# Patient Record
Sex: Male | Born: 1950 | Race: White | Hispanic: No | Marital: Married | Smoking: Never smoker
Health system: Southern US, Community
[De-identification: ages and names within clinical notes are randomized; demographics above are authoritative.]

## PROBLEM LIST (undated history)

## (undated) DIAGNOSIS — E079 Disorder of thyroid, unspecified: Secondary | ICD-10-CM

## (undated) DIAGNOSIS — Z86711 Personal history of pulmonary embolism: Secondary | ICD-10-CM

## (undated) DIAGNOSIS — K1230 Oral mucositis (ulcerative), unspecified: Principal | ICD-10-CM

## (undated) DIAGNOSIS — D689 Coagulation defect, unspecified: Secondary | ICD-10-CM

## (undated) DIAGNOSIS — Z923 Personal history of irradiation: Secondary | ICD-10-CM

## (undated) DIAGNOSIS — I10 Essential (primary) hypertension: Secondary | ICD-10-CM

## (undated) DIAGNOSIS — J309 Allergic rhinitis, unspecified: Secondary | ICD-10-CM

## (undated) DIAGNOSIS — Z87442 Personal history of urinary calculi: Secondary | ICD-10-CM

## (undated) DIAGNOSIS — R109 Unspecified abdominal pain: Secondary | ICD-10-CM

## (undated) DIAGNOSIS — C090 Malignant neoplasm of tonsillar fossa: Secondary | ICD-10-CM

## (undated) DIAGNOSIS — C099 Malignant neoplasm of tonsil, unspecified: Secondary | ICD-10-CM

## (undated) DIAGNOSIS — L270 Generalized skin eruption due to drugs and medicaments taken internally: Principal | ICD-10-CM

## (undated) DIAGNOSIS — E039 Hypothyroidism, unspecified: Secondary | ICD-10-CM

## (undated) HISTORY — DX: Disorder of thyroid, unspecified: E07.9

## (undated) HISTORY — DX: Personal history of pulmonary embolism: Z86.711

## (undated) HISTORY — DX: Generalized skin eruption due to drugs and medicaments taken internally: L27.0

## (undated) HISTORY — DX: Coagulation defect, unspecified: D68.9

## (undated) HISTORY — PX: IR GASTROSTOMY TUBE REMOVAL: IMG5492

## (undated) HISTORY — DX: Unspecified abdominal pain: R10.9

## (undated) HISTORY — PX: KNEE SURGERY: SHX244

## (undated) HISTORY — PX: EYE SURGERY: SHX253

## (undated) HISTORY — DX: Personal history of urinary calculi: Z87.442

## (undated) HISTORY — PX: GASTROSTOMY TUBE PLACEMENT: SHX655

## (undated) HISTORY — PX: FOOT SURGERY: SHX648

## (undated) HISTORY — PX: PORTA CATH INSERTION: CATH118285

## (undated) HISTORY — DX: Oral mucositis (ulcerative), unspecified: K12.30

---

## 1998-10-26 DIAGNOSIS — G4730 Sleep apnea, unspecified: Secondary | ICD-10-CM

## 1998-10-26 DIAGNOSIS — Z87442 Personal history of urinary calculi: Secondary | ICD-10-CM

## 1998-10-26 HISTORY — DX: Sleep apnea, unspecified: G47.30

## 1998-10-26 HISTORY — DX: Personal history of urinary calculi: Z87.442

## 2000-03-10 ENCOUNTER — Other Ambulatory Visit: Admission: RE | Admit: 2000-03-10 | Discharge: 2000-03-10 | Payer: Self-pay | Admitting: Podiatry

## 2002-02-16 ENCOUNTER — Encounter: Payer: Self-pay | Admitting: Family Medicine

## 2002-02-16 ENCOUNTER — Encounter: Admission: RE | Admit: 2002-02-16 | Discharge: 2002-02-16 | Payer: Self-pay | Admitting: Family Medicine

## 2009-09-11 ENCOUNTER — Encounter: Payer: Self-pay | Admitting: Emergency Medicine

## 2009-09-12 ENCOUNTER — Ambulatory Visit: Payer: Self-pay | Admitting: Cardiology

## 2009-09-12 ENCOUNTER — Inpatient Hospital Stay (HOSPITAL_COMMUNITY): Admission: EM | Admit: 2009-09-12 | Discharge: 2009-09-15 | Payer: Self-pay | Admitting: Internal Medicine

## 2009-09-13 ENCOUNTER — Encounter (INDEPENDENT_AMBULATORY_CARE_PROVIDER_SITE_OTHER): Payer: Self-pay | Admitting: Internal Medicine

## 2009-09-13 ENCOUNTER — Ambulatory Visit: Payer: Self-pay | Admitting: Vascular Surgery

## 2009-09-18 ENCOUNTER — Encounter: Admission: RE | Admit: 2009-09-18 | Discharge: 2009-09-18 | Payer: Self-pay | Admitting: Family Medicine

## 2009-09-20 ENCOUNTER — Ambulatory Visit: Payer: Self-pay | Admitting: Oncology

## 2009-09-24 LAB — CBC WITH DIFFERENTIAL/PLATELET
BASO%: 0.3 % (ref 0.0–2.0)
EOS%: 3.1 % (ref 0.0–7.0)
HCT: 41.7 % (ref 38.4–49.9)
MCHC: 33.8 g/dL (ref 32.0–36.0)
MONO#: 0.6 10*3/uL (ref 0.1–0.9)
NEUT%: 69.2 % (ref 39.0–75.0)
RDW: 12.6 % (ref 11.0–14.6)
WBC: 7.6 10*3/uL (ref 4.0–10.3)
lymph#: 1.4 10*3/uL (ref 0.9–3.3)

## 2009-09-24 LAB — PROTHROMBIN TIME: Prothrombin Time: 16.5 seconds — ABNORMAL HIGH (ref 11.6–15.2)

## 2009-10-01 LAB — HYPERCOAGULABLE PANEL, COMPREHENSIVE RET.
AntiThromb III Func: 115 % (ref 76–126)
Anticardiolipin IgA: <3 APL U/mL (ref <10)
Beta-2 Glyco I IgG: <3 U/mL (ref <=15)
Beta-2-Glycoprotein I IgA: 3 U/mL (ref <=15)
Beta-2-Glycoprotein I IgM: <3 U/mL (ref <=15)
Drvvt confirmation: 1.28 Ratio — ABNORMAL HIGH (ref <1.18)
Homocysteine: 14.8 umol/L (ref 4.0–15.4)
Lupus Anticoagulant: DETECTED
Protein C, Total: 82 % (ref 70–140)
Protein S Activity: 49 % — ABNORMAL LOW (ref 69–129)
Protein S Ag, Total: 102 % (ref 70–140)

## 2009-10-01 LAB — COMPREHENSIVE METABOLIC PANEL
ALT: 40 U/L (ref 0–53)
AST: 18 U/L (ref 0–37)
Albumin: 4.1 g/dL (ref 3.5–5.2)
CO2: 27 mEq/L (ref 19–32)
Calcium: 9.1 mg/dL (ref 8.4–10.5)
Chloride: 101 mEq/L (ref 96–112)
Creatinine, Ser: 1.29 mg/dL (ref 0.40–1.50)
Potassium: 4.9 mEq/L (ref 3.5–5.3)
Sodium: 137 mEq/L (ref 135–145)
Total Protein: 7 g/dL (ref 6.0–8.3)

## 2009-10-03 LAB — HEXAGONAL PHOSPHOLIPID NEUTRALIZATION: Hex Phosph Neut Test: NEGATIVE

## 2010-03-10 ENCOUNTER — Ambulatory Visit: Payer: Self-pay | Admitting: Oncology

## 2010-03-12 LAB — LUPUS ANTICOAGULANT PANEL: Lupus Anticoagulant: NOT DETECTED

## 2010-03-31 ENCOUNTER — Encounter: Admission: RE | Admit: 2010-03-31 | Discharge: 2010-03-31 | Payer: Self-pay | Admitting: Family Medicine

## 2010-10-10 ENCOUNTER — Encounter
Admission: RE | Admit: 2010-10-10 | Discharge: 2010-10-10 | Payer: Self-pay | Source: Home / Self Care | Attending: Family Medicine | Admitting: Family Medicine

## 2011-01-28 LAB — CBC
HCT: 38.7 % — ABNORMAL LOW (ref 39.0–52.0)
HCT: 44.6 % (ref 39.0–52.0)
Hemoglobin: 13.3 g/dL (ref 13.0–17.0)
Hemoglobin: 13.4 g/dL (ref 13.0–17.0)
Hemoglobin: 15.4 g/dL (ref 13.0–17.0)
MCHC: 34.5 g/dL (ref 30.0–36.0)
MCV: 97.3 fL (ref 78.0–100.0)
RBC: 3.95 MIL/uL — ABNORMAL LOW (ref 4.22–5.81)
RBC: 3.98 MIL/uL — ABNORMAL LOW (ref 4.22–5.81)
RBC: 4.34 MIL/uL (ref 4.22–5.81)
RDW: 12.6 % (ref 11.5–15.5)
RDW: 12.9 % (ref 11.5–15.5)
WBC: 10.4 10*3/uL (ref 4.0–10.5)
WBC: 9.2 10*3/uL (ref 4.0–10.5)

## 2011-01-28 LAB — BASIC METABOLIC PANEL
CO2: 30 mEq/L (ref 19–32)
Calcium: 9.2 mg/dL (ref 8.4–10.5)
Calcium: 9.5 mg/dL (ref 8.4–10.5)
Chloride: 101 mEq/L (ref 96–112)
Chloride: 102 mEq/L (ref 96–112)
GFR calc Af Amer: >60 mL/min (ref >60)
GFR calc Af Amer: >60 mL/min (ref >60)
GFR calc Af Amer: >60 mL/min (ref >60)
GFR calc non Af Amer: >60 mL/min (ref >60)
Potassium: 4.1 mEq/L (ref 3.5–5.1)
Potassium: 5.2 mEq/L — ABNORMAL HIGH (ref 3.5–5.1)
Sodium: 134 mEq/L — ABNORMAL LOW (ref 135–145)
Sodium: 138 mEq/L (ref 135–145)
Sodium: 139 mEq/L (ref 135–145)

## 2011-01-28 LAB — LUPUS ANTICOAGULANT PANEL
Lupus Anticoagulant: DETECTED — AB
PTTLA 4:1 Mix: 71.5 secs — ABNORMAL HIGH (ref 36.3–48.8)
dRVVT Incubated 1:1 Mix: 41.2 secs (ref 36.1–47.0)

## 2011-01-28 LAB — HEPARIN LEVEL (UNFRACTIONATED)
Heparin Unfractionated: 0.26 IU/mL — ABNORMAL LOW (ref 0.30–0.70)
Heparin Unfractionated: 0.31 IU/mL (ref 0.30–0.70)
Heparin Unfractionated: 0.33 IU/mL (ref 0.30–0.70)

## 2011-01-28 LAB — BETA-2-GLYCOPROTEIN I ABS, IGG/M/A
Beta-2 Glyco I IgG: <3 U/mL (ref <=15)
Beta-2-Glycoprotein I IgA: <3 U/mL (ref <=15)
Beta-2-Glycoprotein I IgM: <3 U/mL (ref <=15)

## 2011-01-28 LAB — CARDIOLIPIN ANTIBODIES, IGG, IGM, IGA: Anticardiolipin IgA: 3 APL U/mL — ABNORMAL LOW (ref <10)

## 2011-01-28 LAB — PROTEIN C, TOTAL: Protein C, Total: 105 % (ref 70–140)

## 2011-01-28 LAB — DIFFERENTIAL
Eosinophils Relative: 0 % (ref 0–5)
Lymphocytes Relative: 11 % — ABNORMAL LOW (ref 12–46)
Lymphs Abs: 1.1 10*3/uL (ref 0.7–4.0)
Monocytes Absolute: 0.9 10*3/uL (ref 0.1–1.0)
Neutro Abs: 8.3 10*3/uL — ABNORMAL HIGH (ref 1.7–7.7)

## 2011-01-28 LAB — PROTIME-INR
INR: 1.09 (ref 0.00–1.49)
INR: 1.19 (ref 0.00–1.49)
INR: 1.53 — ABNORMAL HIGH (ref 0.00–1.49)
Prothrombin Time: 14 seconds (ref 11.6–15.2)
Prothrombin Time: 15 seconds (ref 11.6–15.2)
Prothrombin Time: 18.3 seconds — ABNORMAL HIGH (ref 11.6–15.2)

## 2011-01-28 LAB — URINALYSIS, ROUTINE W REFLEX MICROSCOPIC
Bilirubin Urine: NEGATIVE
Nitrite: NEGATIVE
Specific Gravity, Urine: 1.038 — ABNORMAL HIGH (ref 1.005–1.030)
Urobilinogen, UA: 0.2 mg/dL (ref 0.0–1.0)
pH: 6 (ref 5.0–8.0)

## 2011-01-28 LAB — D-DIMER, QUANTITATIVE: D-Dimer, Quant: 0.58 ug/mL-FEU — ABNORMAL HIGH (ref 0.00–0.48)

## 2011-01-28 LAB — CARDIAC PANEL(CRET KIN+CKTOT+MB+TROPI)
CK, MB: 3.1 ng/mL (ref 0.3–4.0)
Relative Index: 1.5 (ref 0.0–2.5)
Relative Index: 2 (ref 0.0–2.5)
Troponin I: 0.01 ng/mL (ref 0.00–0.06)

## 2011-01-28 LAB — HOMOCYSTEINE: Homocysteine: 24.5 umol/L — ABNORMAL HIGH (ref 4.0–15.4)

## 2011-01-28 LAB — PROTEIN S ACTIVITY: Protein S Activity: 97 % (ref 69–129)

## 2011-01-28 LAB — CK TOTAL AND CKMB (NOT AT ARMC): Relative Index: 1 (ref 0.0–2.5)

## 2011-01-28 LAB — PROTEIN S, TOTAL: Protein S Ag, Total: 108 % (ref 70–140)

## 2011-01-28 LAB — PROTEIN C ACTIVITY: Protein C Activity: 158 % — ABNORMAL HIGH (ref 75–133)

## 2011-01-28 LAB — FACTOR 5 LEIDEN

## 2011-01-28 LAB — APTT: aPTT: 59 seconds — ABNORMAL HIGH (ref 24–37)

## 2015-11-12 ENCOUNTER — Other Ambulatory Visit (HOSPITAL_COMMUNITY)
Admission: RE | Admit: 2015-11-12 | Discharge: 2015-11-12 | Disposition: A | Discharge status: Home / Self Care | Payer: 59 | Source: Ambulatory Visit | Attending: Otolaryngology | Admitting: Otolaryngology

## 2015-11-12 ENCOUNTER — Other Ambulatory Visit: Payer: Self-pay | Admitting: Otolaryngology

## 2015-11-12 DIAGNOSIS — R221 Localized swelling, mass and lump, neck: Secondary | ICD-10-CM | POA: Diagnosis present

## 2015-11-13 ENCOUNTER — Other Ambulatory Visit: Payer: Self-pay | Admitting: Otolaryngology

## 2015-11-13 DIAGNOSIS — R221 Localized swelling, mass and lump, neck: Secondary | ICD-10-CM

## 2015-11-18 ENCOUNTER — Ambulatory Visit
Admission: RE | Admit: 2015-11-18 | Discharge: 2015-11-18 | Disposition: A | Discharge status: Home / Self Care | Payer: 59 | Source: Ambulatory Visit | Attending: Otolaryngology | Admitting: Otolaryngology

## 2015-11-18 DIAGNOSIS — R221 Localized swelling, mass and lump, neck: Secondary | ICD-10-CM

## 2015-11-18 MED ORDER — IOPAMIDOL (ISOVUE-300) INJECTION 61%
75.0000 mL | Freq: Once | INTRAVENOUS | Status: AC | PRN
  Administered 2015-11-18: 75 mL via INTRAVENOUS

## 2015-11-20 ENCOUNTER — Other Ambulatory Visit: Payer: Self-pay | Admitting: Otolaryngology

## 2015-11-21 ENCOUNTER — Telehealth: Payer: Self-pay | Admitting: Hematology and Oncology

## 2015-11-21 ENCOUNTER — Encounter: Payer: Self-pay | Admitting: Radiation Oncology

## 2015-11-21 NOTE — Telephone Encounter (Signed)
Pt aware of np appt on 11/25/15@9 :30 Referring Dr. Redmond Baseman Neck mass

## 2015-11-21 NOTE — Progress Notes (Addendum)
Head and Neck Cancer Location of Tumor / Histology: Left Neck  Diagnosis 11/20/2015: Tonsil, biopsy, left - INVASIVE SQUAMOUS CELL CARCINOMA  11/12/15 Diagnosis NECK, FINE NEEDLE ASPIRATION, LEFT ZONE 2 MASS, (SPECIMEN 1 OF 1, COLLECTED ON 11/12/15): MALIGNANT CELLS PRESENT SEE COMMENT COMMENT: THE MALIGNANT CELLS ARE CONSISTENT WITH SQUAMOUS CELL CARCINOMA.  Patient presented to Dr. Redmond Baseman on 11/12/15 as he had noticed a lump under the left jaw for the past two months. His primary care doctor treated him with 10 days of Keflex with some decrease in size.   Biopsies of Left Neck Mass revealed: Squamous Cell Carcinoma   Nutrition Status Yes No Comments  Weight changes? []  [x]    Swallowing concerns? [x]  []  Sore s/p bx 11/20/15  PEG? []  [x]     Referrals Yes No Comments  Social Work? []  [x]    Dentistry? []  [x]    Swallowing therapy? []  [x]    Nutrition? []  [x]    Med/Onc? [x]  []  Dr. Lucas Mallow today 930am   Safety Issues Yes No Comments  Prior radiation? []  [x]    Pacemaker/ICD? []  [x]    Possible current pregnancy? []  [x]    Is the patient on methotrexate? []  [x]     Tobacco/Marijuana/Snuff/ETOH use: He used to chew tobacco but quit 9 years ago. No cigarette smoking, no alcohol use, no illicit drug use  Past/Anticipated interventions by otolaryngology, if any: He saw Dr. Melida Quitter on 11/12/15. He performed a Fine needle aspiration of his Left neck mass. He also had a CT ordered, which was performed on 11/18/15  Past/Anticipated interventions by medical oncology, if any: He has an appointment with Dr. Alvy Bimler today at 9:30.     Current Complaints / other details:  Married, 3 children, Sleep apnea, , Father esophagus cancer dx 35, deceased 3 months later,  Age, 73, maternal and paternal grandmother brain ca, maternal grandfather lung ca, maternal aunt brast ca living, maternal cousin breast ca,   BP 128/72 mmHg  Pulse 86  Temp(Src) 97.7 F (36.5 C) (Oral)  Resp 20  Ht 5\' 11"  (1.803  m)  Wt 201 lb 4.8 oz (91.309 kg)  BMI 28.09 kg/m2  SpO2 99%  Wt Readings from Last 3 Encounters:  11/25/15 201 lb 4.8 oz (91.309 kg)

## 2015-11-22 ENCOUNTER — Encounter: Payer: Self-pay | Admitting: *Deleted

## 2015-11-22 NOTE — Progress Notes (Signed)
  Oncology Nurse Navigator Documentation  Navigator Location: [facility]Med Onc (11/22/15 1621) Navigator Encounter Type: Introductory phone call;Telephone (11/22/15 1621) Telephone: Lahoma Crocker Call;Appt Confirmation/Clarification (11/22/15 1621) Abnormal Finding Date: 11/12/15 (11/22/15 1621) Confirmed Diagnosis Date: 11/20/15 (11/22/15 1621)      Placed introductory call to new referral patient, spoke with him and his wife.  Introduced myself as the oncology nurse navigator that works with Drs Isidore Moos and Alvy Bimler to whom he has been referred by Dr Redmond Baseman.  He confirmed understanding of referral and appts next Monday starting with 7:30 NE in Radiation Oncology.  I encouraged him to arrive by 7:15 to register.  I briefly explained my role as a navigator, indicated that I would be joining him during his appts.  I confirmed their understanding of the [facility] location, explained arrival and RadOnc registration process for appt.  I provided my contact information, encouraged him to call with questions/concerns before next week.  He verbalized understanding of information provided, expressed appreciation for my call.  Gayleen Orem, RN, BSN, [place] at [place] [phone]                                   Time Spent with Patient: 15 (11/22/15 1621)

## 2015-11-25 ENCOUNTER — Encounter: Payer: Self-pay | Admitting: *Deleted

## 2015-11-25 ENCOUNTER — Encounter: Payer: Self-pay | Admitting: Radiation Oncology

## 2015-11-25 ENCOUNTER — Telehealth: Payer: Self-pay | Admitting: *Deleted

## 2015-11-25 ENCOUNTER — Ambulatory Visit (HOSPITAL_BASED_OUTPATIENT_CLINIC_OR_DEPARTMENT_OTHER): Payer: 59 | Admitting: Hematology and Oncology

## 2015-11-25 ENCOUNTER — Ambulatory Visit
Admission: RE | Admit: 2015-11-25 | Discharge: 2015-11-25 | Disposition: A | Discharge status: Home / Self Care | Payer: 59 | Source: Ambulatory Visit | Attending: Radiation Oncology | Admitting: Radiation Oncology

## 2015-11-25 ENCOUNTER — Encounter: Payer: Self-pay | Admitting: Hematology and Oncology

## 2015-11-25 VITALS — BP 123/80 | HR 64 | Temp 97.5°F | Resp 18 | Ht 71.0 in | Wt 200.7 lb

## 2015-11-25 DIAGNOSIS — Z808 Family history of malignant neoplasm of other organs or systems: Secondary | ICD-10-CM

## 2015-11-25 DIAGNOSIS — C099 Malignant neoplasm of tonsil, unspecified: Secondary | ICD-10-CM | POA: Diagnosis not present

## 2015-11-25 DIAGNOSIS — E079 Disorder of thyroid, unspecified: Secondary | ICD-10-CM | POA: Insufficient documentation

## 2015-11-25 DIAGNOSIS — I10 Essential (primary) hypertension: Secondary | ICD-10-CM | POA: Diagnosis not present

## 2015-11-25 DIAGNOSIS — Z803 Family history of malignant neoplasm of breast: Secondary | ICD-10-CM | POA: Diagnosis not present

## 2015-11-25 DIAGNOSIS — F1010 Alcohol abuse, uncomplicated: Secondary | ICD-10-CM | POA: Diagnosis not present

## 2015-11-25 DIAGNOSIS — D689 Coagulation defect, unspecified: Secondary | ICD-10-CM | POA: Diagnosis not present

## 2015-11-25 DIAGNOSIS — J309 Allergic rhinitis, unspecified: Secondary | ICD-10-CM | POA: Insufficient documentation

## 2015-11-25 DIAGNOSIS — Z87891 Personal history of nicotine dependence: Secondary | ICD-10-CM | POA: Insufficient documentation

## 2015-11-25 DIAGNOSIS — C024 Malignant neoplasm of lingual tonsil: Secondary | ICD-10-CM

## 2015-11-25 DIAGNOSIS — R599 Enlarged lymph nodes, unspecified: Secondary | ICD-10-CM | POA: Diagnosis not present

## 2015-11-25 DIAGNOSIS — C090 Malignant neoplasm of tonsillar fossa: Secondary | ICD-10-CM | POA: Insufficient documentation

## 2015-11-25 DIAGNOSIS — Z510 Encounter for antineoplastic radiation therapy: Secondary | ICD-10-CM | POA: Insufficient documentation

## 2015-11-25 DIAGNOSIS — Z801 Family history of malignant neoplasm of trachea, bronchus and lung: Secondary | ICD-10-CM | POA: Diagnosis not present

## 2015-11-25 HISTORY — DX: Allergic rhinitis, unspecified: J30.9

## 2015-11-25 HISTORY — DX: Essential (primary) hypertension: I10

## 2015-11-25 NOTE — Progress Notes (Signed)
  Oncology Nurse Navigator Documentation  Navigator Location: [facility] (11/25/15 7867) Navigator Encounter Type: Initial MedOnc (11/25/15 0910)             Treatment Phase: Pre-Tx/Tx Discussion (11/25/15 0910)                      Acuity Level 2: Educational needs (11/25/15 6720)     Met with patient and his wife during initial consult with Dr. Alvy Bimler.   They verbalized understanding of proposed Erbitux in bco of current hearing deficit. Showed them the location of Dr. Ritta Slot office and [facility] Radiology as reference for future appts, including arrival procedure for these appts.   They verbalized understanding of information provided. I encouraged them to call with questions/concerns, they verbalized understanding.  Gayleen Orem, RN, BSN, [place] at [place] [phone]    Time Spent with Patient: 60 (11/25/15 0910)

## 2015-11-25 NOTE — Progress Notes (Signed)
  Oncology Nurse Navigator Documentation  Navigator Location: [facility] (11/25/15 1650) Navigator Encounter Type: Telephone (11/25/15 1650) Telephone: Outgoing Call;Diagnostic Results (11/25/15 1650)       Per Dr Isidore Moos, I called Mr Degraffenried, informed him his pathology update was received, indicated HPV positive.  He expressed appreciation for information.  Gayleen Orem, RN, BSN, [place] at [place] [phone]                                     Time Spent with Patient: 15 (11/25/15 1650)

## 2015-11-25 NOTE — Progress Notes (Signed)
Please see the Nurse Progress Note in the MD Initial Consult Encounter for this patient. 

## 2015-11-25 NOTE — Progress Notes (Addendum)
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 Carcinoma of tonsillar fossa (HCC) 146.1 C09.0 Ambulatory referral to Social Work     Ambulatory referral to Physical Therapy     Amb Referral to Nutrition and Diabetic E     Referral to Neuro Rehab     Ambulatory referral to Dentistry     NM PET Image Initial (PI) Skull Base To Thigh    HISTORY OF PRESENT ILLNESS::Rishabh R Mengel is a 65 y.o. male who presented with a left neck mass 2 months ago.  His dentist told him to see his PCP if it persisted for over a week, and then his PCP tried ABX with no improvement. His PCP, Dr. Rex Kras, then referred the patient to see Dr Redmond Baseman of ENT.  Dr. Redmond Baseman performed biopsy of the left neck on 1-17 revealed: malignant cells consistent with Squamous cell carcinoma.  On 11-20-15 biopsy of the left tonsil revealed invasive squamous cell carcinoma, p16 results pending.    Pertinent imaging thus far includes CT of neck performed on 11-18-15 revealing possble left palative tonsil mass and bilateral neck adenopathy, L>>R.      Swallowing issues, if any: sore throat but no mechanical complaints  Weight Changes: gaining weight  Pain status: sore throat since biopsy, not severe  Other symptoms: in good shape, working on his farm  Tobacco history, if any: chewed and quit 9 years ago  ETOH abuse, if any: none, +social beer.  Prior cancers, if any: none reported  PREVIOUS RADIATION THERAPY: No  PAST MEDICAL HISTORY:  has a past medical history of Allergic rhinitis; Hypertension; Thyroid disease; and Clotting disorder ([place]).    PAST SURGICAL HISTORY: Past Surgical History  Procedure Laterality Date    . Eye surgery    . Foot surgery    . Knee surgery      FAMILY HISTORY: family history is not on file.  SOCIAL HISTORY:  reports that he has never smoked. He quit smokeless tobacco use about 9 years ago. His smokeless tobacco use included Chew. He reports that he drinks alcohol. He reports that he does not use illicit drugs.  ALLERGIES: Review of patient's allergies indicates no known allergies.  MEDICATIONS:  Current Outpatient Prescriptions  Medication Sig Dispense Refill  . aspirin EC 81 MG tablet Take 81 mg by mouth daily.    Marland Kitchen ibuprofen (ADVIL,MOTRIN) 200 MG tablet Take 200 mg by mouth as needed.    Marland Kitchen levothyroxine (SYNTHROID, LEVOTHROID) 75 MCG tablet Take 75 mcg by mouth daily before breakfast.    . losartan (COZAAR) 25 MG tablet Take 25 mg by mouth every morning.  2   No current facility-administered medications for this encounter.    REVIEW OF SYSTEMS:  Notable for that above.   PHYSICAL EXAM:  height is '5\' 11"'  (1.803 m) and weight is 201 lb 4.8 oz (91.309 kg). His oral temperature is 97.7 F (36.5 C). His blood pressure is 128/72 and his pulse is 86. His respiration is 20 and oxygen saturation is 99%.   General: Alert and oriented, in no acute distress HEENT: Head is normocephalic. Extraocular movements are intact. Oropharynx is notable for granulation tissue over left tonsil,  slightly lumpy borders of right tonsil but no obvious tumor on either side.. Neck: Neck is notable for fullness beneath left SCM; less obvious fullness posterior to right SCM Heart: Regular in rate and rhythm with no murmurs, rubs, or gallops. Chest: Clear to auscultation bilaterally, with no rhonchi, wheezes, or rales. Abdomen: Soft, nontender, nondistended, with no rigidity or guarding. Extremities: No cyanosis or edema in UE's. Lymphatics: see Neck Exam Skin: No concerning lesions. Musculoskeletal: symmetric strength and muscle tone throughout. Neurologic: Cranial nerves II through XII are  grossly intact. No obvious focalities. Speech is fluent. Coordination is intact. Psychiatric: Judgment and insight are intact. Affect is appropriate.   ECOG = 0  0 - Asymptomatic (Fully active, able to carry on all predisease activities without restriction)  1 - Symptomatic but completely ambulatory (Restricted in physically strenuous activity but ambulatory and able to carry out work of a light or sedentary nature. For example, light housework, office work)  2 - Symptomatic, <50% in bed during the day (Ambulatory and capable of all self care but unable to carry out any work activities. Up and about more than 50% of waking hours)  3 - Symptomatic, >50% in bed, but not bedbound (Capable of only limited self-care, confined to bed or chair 50% or more of waking hours)  4 - Bedbound (Completely disabled. Cannot carry on any self-care. Totally confined to bed or chair)  5 - Death   Eustace Pen MM, Creech RH, Tormey DC, et al. [phone]). "Toxicity and response criteria of the [facility]". [place] Oncol. 5 (6): 649-55   LABORATORY DATA:  Lab Results  Component Value Date   WBC 7.6 09/24/2009   HGB 14.1 09/24/2009   HCT 41.7 09/24/2009   MCV 96.9 09/24/2009   PLT 340 09/24/2009   CMP     Component Value Date/Time   NA 137 09/24/2009 1331   K 4.9 09/24/2009 1331   CL 101 09/24/2009 1331   CO2 27 09/24/2009 1331   GLUCOSE 98 09/24/2009 1331   BUN 19 09/24/2009 1331   CREATININE 1.29 09/24/2009 1331   CALCIUM 9.1 09/24/2009 1331   PROT 7.0 09/24/2009 1331   ALBUMIN 4.1 09/24/2009 1331   AST 18 09/24/2009 1331   ALT 40 09/24/2009 1331   ALKPHOS 123* 09/24/2009 1331   BILITOT 0.3 09/24/2009 1331   GFRNONAA >60 09/14/2009 0510   GFRAA  09/14/2009 0510    >60        The eGFR has been calculated using the MDRD equation. This calculation has not been validated in all clinical situations. eGFR's persistently <60 mL/min signify possible Chronic Kidney Disease.          RADIOGRAPHY: Ct Soft Tissue Neck W Contrast  11/18/2015  CLINICAL DATA:  Left-sided neck mass present for a few months. Aspirated 1 week ago. Creatinine was obtained on site at [place] at [address] Results: Creatinine 1.1 mg/dL. EXAM: CT NECK WITH CONTRAST TECHNIQUE: Multidetector CT imaging of the neck was performed using the standard protocol following the bolus administration of intravenous contrast. CONTRAST:  30m ISOVUE-300 IOPAMIDOL (ISOVUE-300) INJECTION 61% COMPARISON:  None. FINDINGS: Pharynx and larynx: There is mild asymmetric enlargement of the left superior palatine tonsil without a discrete hyper enhancing mass identified. The larynx is unremarkable. Salivary glands: The submandibular and parotid glands are unremarkable. Thyroid: Unremarkable. Lymph nodes: There are multiple enlarged lymph nodes in the left neck. The largest measures 2.2 cm in short axis in level III (  series 2, image 44). A left level IB/II lymph node measures 1.2 cm in short axis. Additional left level II and III lymph nodes measure up to 1.3 cm in short axis. Nonenlarged left level IV lymph nodes measure up to 5 mm in short axis but are increased in number compared to the contralateral side. An enlarged right level III lymph node measures 1.1 cm in short axis (series 2, image 48). Some of the larger lymph nodes demonstrate heterogeneous enhancement which may reflect small areas of necrosis or cystic change. Vascular: Major vascular structures of the neck appear patent. The left internal jugular vein is compressed by the largest left level III lymph node. Limited intracranial: The visualized portion of the brain is unremarkable. Visualized orbits: Unremarkable. Mastoids and visualized paranasal sinuses: Right maxillary sinus mucous retention cyst. Clear mastoid air cells. Skeleton: Mild cervical spondylosis. No suspicious lytic or blastic osseous lesions identified. Upper chest: Unremarkable.  IMPRESSION: 1. Left greater than right cervical lymphadenopathy suspicious for nodal metastatic disease. 2. Mild asymmetry of the left tonsil -- correlate with direct visualization to assess for primary neoplasm. Electronically Signed   By: Logan Bores M.D.   On: 11/18/2015 17:58      IMPRESSION/PLAN:  This is a delightful patient with head and neck cancer (left tonsil).  I recommend 7 weeks of radiotherapy for this patient.  We discussed the potential risks, benefits, and side effects of radiotherapy. We talked in detail about acute and late effects. We discussed that some of the most bothersome acute effects may be mucositis, dysgeusia, salivary changes, skin irritation, hair loss, dehydration, weight loss and fatigue. We talked about late effects which include but are not necessarily limited to dysphagia, hypothyroidism, nerve injury, spinal cord injury, xerostomia, trismus, and neck edema. No guarantees of treatment were given. A consent form was signed and placed in the patient's medical record. The patient is enthusiastic about proceeding with treatment. I look forward to participating in the patient's care.    Simulation (treatment planning) will take place after clearance by Dr Enrique Sack, and after PET, for complete staging.  We also discussed that the treatment of head and neck cancer is a multidisciplinary process to maximize treatment outcomes and quality of life. For this reasons the following referrals have been or will be made:   Medical oncology to discuss chemotherapy    Dentistry for dental evaluation, possible extractions in the radiation fields, and /or advice on reducing risk of cavities, osteoradionecrosis, or other oral issues.   Nutritionist for nutrition support during and after treatment.   Speech language pathology for swallowing and/or speech therapy.   Social work for social support.    Physical therapy due to risk of lymphedema in neck and deconditioning.   PET  scan to stage cancer.   I will defer to Dr Alvy Bimler of med/onc re: orders for P-A-C and PEG tube. __________________________________________   Eppie Gibson, MD

## 2015-11-25 NOTE — Telephone Encounter (Signed)
CALLED PATIENT TO INFORM OF PET SCAN FOR 11-28-15 - ARRIVAL TIME - 7 AM @ [place], LVM FOR A RETURN CALL

## 2015-11-25 NOTE — Progress Notes (Addendum)
  Oncology Nurse Navigator Documentation  Navigator Location: [facility] (11/25/15 0750) Navigator Encounter Type: Initial RadOnc (11/25/15 0750)             Treatment Phase: Pre-Tx/Tx Discussion (11/25/15 0750) Barriers/Navigation Needs: No barriers at this time (11/25/15 0750)   Interventions: None required (11/25/15 0750)                Acuity Level 2: Educational needs;Initial guidance, education and coordination as needed (11/25/15 0750)      Met with Gregory Jenkins during initial consult with Dr. Isidore Moos.  He was accompanied by his wife Corporate treasurer.   1. Further introduced myself as his Navigator, explained my role as a member of the Care Team.   2. Provided New Patient Information packet, discussed contents:  Contact information for physician(s), myself, other members of the Care Team.  Advance Directive information ([place] blue pamphlet with LCSW contact info).  He indicated he has both Living Will and HCPOA, would bring copies.  Fall Prevention Patient Safety Plan  Appointment Guideline  Financial Assistance Information sheet  [place] with highlight of [place] 3. Provided introductory explanation of radiation treatment including SIM planning and purpose of Aquaplast head and shoulder mask, showed them example.   4. In support of Dr Pearlie Oyster discussion, provided photos/diagrams of feeding tube and PAC, explained their purpose. 5. I discussed mentoring program, offered to arrange support.  They noted they have an acquaintance who recently completed H&N cancer and would be available. 6. He reported:  Non-drinker, stopped chewing tobacco 9 yrs ago.  Retired 10 yrs ago, was Clinical cytogeneticist for [employer]  Now works his farm (animals only).  Bilateral hearing loss r/t job.  Currently experiencing thickened phlegm, sinus drainage. 7. Provided a tour of SIM and Tomo areas, explained treatment and arrival procedures. They verbalized understanding of information  provided.    Gayleen Orem, RN, BSN, [place] at [place] [phone]     Time Spent with Patient: 90 (11/25/15 0750)

## 2015-11-25 NOTE — Progress Notes (Signed)
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 male is here because of newly diagnosed  Left tonsil cancer According to the patient, the first initial presentation was due to  Self palpated a lump on the left side of the neck. He saw his dentist originally who recommend him to follow-up with PCP.  He was prescribed 10 days course of antibiotics with no improvement and was subsequently referred to ENT for further evaluation. 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 Mild asymmetry of the left tonsil -- correlate with direct visualization to assess for primary neoplasm.   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 He drinks alcohol occasionally. HPV status is pending. 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 He does not wear a hearing aid due to cost issue. Since the tonsil biopsy, he had recurrent  hemoptysis as night which subsequently resolved spontaneously. Currently, he denies severe pain. He denies difficulties with chewing food, swallowing difficulties, painful swallowing, changes in the quality of voice or abnormal weight loss.   MEDICAL HISTORY:  Past Medical History  Diagnosis Date  . Allergic rhinitis   . Hypertension   . Thyroid disease   . 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 Eye surgery    . Foot surgery    . Knee surgery      SOCIAL HISTORY: Social History   Social History  . Marital Status: Married    Spouse Name: N/A  . Number of Children: N/A  . Years of Education: N/A   Occupational History  . Not on file.   Social History Main Topics  . Smoking status: Never Smoker   . Smokeless tobacco: Former Systems developer    Types: Chew    Quit date: 11/20/2006  . Alcohol Use: 0.0 oz/week    0 Standard drinks or equivalent per week     Comment: He reports he is a social drinker  . Drug Use: No  . Sexual Activity: Not on file   Other Topics Concern  . Not on file   Social History Narrative    FAMILY HISTORY: Family History  Problem Relation Age of Onset  . Cancer Father     esophageal ca  . Cancer Maternal Aunt     breast ca  . Cancer Maternal Grandmother     brain ca  . Cancer Maternal Grandfather  lung ca    ALLERGIES:  has No Known Allergies.  MEDICATIONS:  Current Outpatient Prescriptions  Medication Sig Dispense Refill  . aspirin EC 81 MG tablet Take 81 mg by mouth daily.    Marland Kitchen ibuprofen (ADVIL,MOTRIN) 200 MG tablet Take 200 mg by mouth as needed.    Marland Kitchen levothyroxine (SYNTHROID, LEVOTHROID) 75 MCG tablet Take 75 mcg by mouth daily before breakfast.    . losartan (COZAAR) 25 MG tablet Take 25 mg by mouth every morning.  2   No current facility-administered medications for this visit.    REVIEW OF SYSTEMS:   Constitutional: Denies fevers,  chills or abnormal night sweats Eyes: Denies blurriness of vision, double vision or watery eyes Respiratory: Denies cough, dyspnea or wheezes Cardiovascular: Denies palpitation, chest discomfort or lower extremity swelling Gastrointestinal:  Denies nausea, heartburn or change in bowel habits Skin: Denies abnormal skin rashes Neurological:Denies numbness, tingling or new weaknesses Behavioral/Psych: Mood is stable, no new changes  All other systems were reviewed with the patient and are negative.  PHYSICAL EXAMINATION: ECOG PERFORMANCE STATUS: 0 - Asymptomatic  Filed Vitals:   11/25/15 0915  BP: 123/80  Pulse: 64  Temp: 97.5 F (36.4 C)  Resp: 18   Filed Weights   11/25/15 0915  Weight: 200 lb 11.2 oz (91.037 kg)    GENERAL:alert, no distress and comfortable SKIN: skin color, texture, turgor are normal, no rashes or significant lesions EYES: normal, conjunctiva are pink and non-injected, sclera clear OROPHARYNX:no exudate, no erythema and lips, buccal mucosa, and tongue normal  NECK: supple, thyroid normal size, non-tender, without nodularity LYMPH: He has palpable lymphadenopathy most pronounced on the left side of the neck. LUNGS: clear to auscultation and percussion with normal breathing effort HEART: regular rate & rhythm and no murmurs and no lower extremity edema ABDOMEN:abdomen soft, non-tender and normal bowel sounds Musculoskeletal:no cyanosis of digits and no clubbing  PSYCH: alert & oriented x 3 with fluent speech NEURO: no focal motor/sensory deficits  LABORATORY DATA:  I have reviewed the data as listed Lab Results  Component Value Date   WBC 7.6 09/24/2009   HGB 14.1 09/24/2009   HCT 41.7 09/24/2009   MCV 96.9 09/24/2009   PLT 340 09/24/2009   Lab Results  Component Value Date   NA 137 09/24/2009   K 4.9 09/24/2009   CL 101 09/24/2009   CO2 27 09/24/2009    RADIOGRAPHIC STUDIES: I have personally reviewed the radiological images as listed and  agreed with the findings in the report. Ct Soft Tissue Neck W Contrast  11/18/2015  CLINICAL DATA:  Left-sided neck mass present for a few months. Aspirated 1 week ago. Creatinine was obtained on site at [place] at [address] Results: Creatinine 1.1 mg/dL. EXAM: CT NECK WITH CONTRAST TECHNIQUE: Multidetector CT imaging of the neck was performed using the standard protocol following the bolus administration of intravenous contrast. CONTRAST:  44mL ISOVUE-300 IOPAMIDOL (ISOVUE-300) INJECTION 61% COMPARISON:  None. FINDINGS: Pharynx and larynx: There is mild asymmetric enlargement of the left superior palatine tonsil without a discrete hyper enhancing mass identified. The larynx is unremarkable. Salivary glands: The submandibular and parotid glands are unremarkable. Thyroid: Unremarkable. Lymph nodes: There are multiple enlarged lymph nodes in the left neck. The largest measures 2.2 cm in short axis in level III (series 2, image 44). A left level IB/II lymph node measures 1.2 cm in short axis. Additional left level II and III lymph nodes measure up to 1.3 cm in  short axis. Nonenlarged left level IV lymph nodes measure up to 5 mm in short axis but are increased in number compared to the contralateral side. An enlarged right level III lymph node measures 1.1 cm in short axis (series 2, image 48). Some of the larger lymph nodes demonstrate heterogeneous enhancement which may reflect small areas of necrosis or cystic change. Vascular: Major vascular structures of the neck appear patent. The left internal jugular vein is compressed by the largest left level III lymph node. Limited intracranial: The visualized portion of the brain is unremarkable. Visualized orbits: Unremarkable. Mastoids and visualized paranasal sinuses: Right maxillary sinus mucous retention cyst. Clear mastoid air cells. Skeleton: Mild cervical spondylosis. No suspicious lytic or blastic osseous lesions identified. Upper chest:  Unremarkable. IMPRESSION: 1. Left greater than right cervical lymphadenopathy suspicious for nodal metastatic disease. 2. Mild asymmetry of the left tonsil -- correlate with direct visualization to assess for primary neoplasm. Electronically Signed   By: Logan Bores M.D.   On: 11/18/2015 17:58    ASSESSMENT & PLAN Tonsil cancer ([place])  Clinically, he has stage IV disease with bilateral lymphadenopathy in the neck.  Stage of the disease is to be determined, a PET/CT scan has been ordered.   Prognosis would depend on the results for the PET/CT scan, to be discussed and reviewed in the next visit.   Treatment options would include chemotherapy only, radiation only or chemotherapy in combination with radiation therapy.    The patient would likely benefit from concurrent chemoradiation therapy with weekly cetuximab if PET CT scan showed disease confined to the head and neck region In preparation for treatment, the patient will need the following tests or referrals, to be arranged #1 [place] [facility] #2 Referral to dentist for full dental evaluation and possible dental extraction  #3 PET/CT scan.  #4 Possible referral for feeding tube placement.  #5 Possible Infusaport placement   I will coordinate with the navigator to arrange for the appointment for a return visit back once we have more test results available. We will discuss his case at the next ENT tumor board in 2 days       All questions were answered. The patient knows to call the clinic with any problems, questions or concerns. I spent 40 minutes counseling the patient face to face. The total time spent in the appointment was 60 minutes and more than 50% was on counseling.     [facility], [place], MD 11/25/2015 12:38 PM

## 2015-11-25 NOTE — Assessment & Plan Note (Signed)
Clinically, he has stage IV disease with bilateral lymphadenopathy in the neck.  Stage of the disease is to be determined, a PET/CT scan has been ordered.   Prognosis would depend on the results for the PET/CT scan, to be discussed and reviewed in the next visit.   Treatment options would include chemotherapy only, radiation only or chemotherapy in combination with radiation therapy.    The patient would likely benefit from concurrent chemoradiation therapy with weekly cetuximab if PET CT scan showed disease confined to the head and neck region In preparation for treatment, the patient will need the following tests or referrals, to be arranged #1 [place] [facility] #2 Referral to dentist for full dental evaluation and possible dental extraction  #3 PET/CT scan.  #4 Possible referral for feeding tube placement.  #5 Possible Infusaport placement   I will coordinate with the navigator to arrange for the appointment for a return visit back once we have more test results available. We will discuss his case at the next ENT tumor board in 2 days

## 2015-11-26 ENCOUNTER — Other Ambulatory Visit (HOSPITAL_COMMUNITY): Payer: 59 | Admitting: Dentistry

## 2015-11-28 ENCOUNTER — Telehealth: Payer: Self-pay | Admitting: *Deleted

## 2015-11-28 ENCOUNTER — Encounter
Admission: RE | Admit: 2015-11-28 | Discharge: 2015-11-28 | Disposition: A | Discharge status: Home / Self Care | Payer: 59 | Source: Ambulatory Visit | Attending: Radiation Oncology | Admitting: Radiation Oncology

## 2015-11-28 DIAGNOSIS — C090 Malignant neoplasm of tonsillar fossa: Secondary | ICD-10-CM | POA: Diagnosis not present

## 2015-11-28 LAB — GLUCOSE, CAPILLARY: Glucose-Capillary: 91 mg/dL (ref 65–99)

## 2015-11-28 MED ORDER — FLUDEOXYGLUCOSE F - 18 (FDG) INJECTION
12.5900 | Freq: Once | INTRAVENOUS | Status: AC | PRN
  Administered 2015-11-28: 12.59 via INTRAVENOUS

## 2015-11-28 NOTE — Telephone Encounter (Signed)
  Oncology Nurse Navigator Documentation  Navigator Location: [facility] (11/28/15 1029) Navigator Encounter Type: Telephone (11/28/15 1029) Telephone: Incoming Call;Diagnostic Results (11/28/15 1029)     Patient's wife called with request that they be contacted with results of this morning's PET scan (completed at [facility]) when available.  She explained they 1) are anxious to know results in context of tmt plan as discussed on Monday, 2) have not yet discussed dx or treatment plan with family until they themselves have as much information as possible.  I indicated I would forward request to Drs Isidore Moos and Alvy Bimler.  Gayleen Orem, RN, BSN, [place] at [place] [phone]                                       Time Spent with Patient: 15 (11/28/15 1029)

## 2015-11-28 NOTE — Telephone Encounter (Signed)
Pls do Keep in informed after he sees Dr. Raliegh Ip

## 2015-12-02 ENCOUNTER — Encounter (HOSPITAL_COMMUNITY): Payer: Self-pay | Admitting: Dentistry

## 2015-12-02 ENCOUNTER — Ambulatory Visit (HOSPITAL_COMMUNITY): Payer: Self-pay | Admitting: Dentistry

## 2015-12-02 VITALS — BP 121/71 | HR 63 | Temp 97.8°F

## 2015-12-02 DIAGNOSIS — K029 Dental caries, unspecified: Secondary | ICD-10-CM

## 2015-12-02 DIAGNOSIS — M264 Malocclusion, unspecified: Secondary | ICD-10-CM

## 2015-12-02 DIAGNOSIS — K08409 Partial loss of teeth, unspecified cause, unspecified class: Secondary | ICD-10-CM

## 2015-12-02 DIAGNOSIS — K030 Excessive attrition of teeth: Secondary | ICD-10-CM

## 2015-12-02 DIAGNOSIS — IMO0002 Reserved for concepts with insufficient information to code with codable children: Secondary | ICD-10-CM

## 2015-12-02 DIAGNOSIS — C099 Malignant neoplasm of tonsil, unspecified: Secondary | ICD-10-CM

## 2015-12-02 DIAGNOSIS — M270 Developmental disorders of jaws: Secondary | ICD-10-CM

## 2015-12-02 DIAGNOSIS — K036 Deposits [accretions] on teeth: Secondary | ICD-10-CM

## 2015-12-02 DIAGNOSIS — K0850 Unsatisfactory restoration of tooth, unspecified: Secondary | ICD-10-CM

## 2015-12-02 DIAGNOSIS — K0530 Chronic periodontitis, unspecified: Secondary | ICD-10-CM

## 2015-12-02 DIAGNOSIS — Z01818 Encounter for other preprocedural examination: Secondary | ICD-10-CM

## 2015-12-02 MED ORDER — SODIUM FLUORIDE 1.1 % DT GEL: DENTAL | Status: DC

## 2015-12-02 NOTE — Patient Instructions (Signed)

## 2015-12-02 NOTE — Progress Notes (Signed)
DENTAL CONSULTATION  Date of Consultation:  12/02/2015 Patient Name:   Gregory Jenkins Date of Birth:   01/02/1951 Medical Record Number: [id number]  VITALS: BP 121/71 mmHg  Pulse 63  Temp(Src) 97.8 F (36.6 C) (Oral)  CHIEF COMPLAINT: Patient referred by Dr. Isidore Moos for a dental consultation.   HPI: Gregory Jenkins  is a 65 year old male recently diagnosed with squamous cell carcinoma of the left tonsil. Patient with anticipated chemoradiation therapy. Patient now seen as part of a medically necessary pre-chemoradiation therapy dental protocol examination.  Patient currently denies acute toothaches, swellings, or abscesses. Patient was recently seen for a cleaning on 08/27/2015 as well as dental restoration completed on 10/03/2015. This is with his primary dentist, Dr. Renelda Mom. Patient is usually seen on every 6 month basis. Next dental cleaning is scheduled for 03/02/2016. The patient denies having any partial dentures.   PROBLEM LIST: Patient Active Problem List   Diagnosis Date Noted  . Tonsil cancer ([place]) 11/25/2015    Priority: Medium    PMH: Past Medical History  Diagnosis Date  . Allergic rhinitis   . Hypertension   . Thyroid disease   . Clotting disorder ([facility])     PE, suspect lupus anticoagulant  . History of pulmonary embolism   . History of nephrolithiasis    PSH: Past Surgical History  Procedure Laterality Date  . Eye surgery Right 1980's    H/O right orbital "blowout" fracture  . Foot surgery Left     [facility] Neuroma  . Knee surgery Right     Arthroscopic    ALLERGIES: No Known Allergies  MEDICATIONS: Current Outpatient Prescriptions  Medication Sig Dispense Refill  . aspirin EC 81 MG tablet Take 81 mg by mouth daily.    Marland Kitchen ibuprofen (ADVIL,MOTRIN) 200 MG tablet Take 200 mg by mouth as needed.    Marland Kitchen levothyroxine (SYNTHROID, LEVOTHROID) 75 MCG tablet Take 75 mcg by mouth daily before breakfast.    . losartan (COZAAR) 25 MG tablet Take 25 mg by  mouth every morning.  2   No current facility-administered medications for this visit.    LABS: Lab Results  Component Value Date   WBC 7.6 09/24/2009   HGB 14.1 09/24/2009   HCT 41.7 09/24/2009   MCV 96.9 09/24/2009   PLT 340 09/24/2009      Component Value Date/Time   NA 137 09/24/2009 1331   K 4.9 09/24/2009 1331   CL 101 09/24/2009 1331   CO2 27 09/24/2009 1331   GLUCOSE 98 09/24/2009 1331   BUN 19 09/24/2009 1331   CREATININE 1.29 09/24/2009 1331   CALCIUM 9.1 09/24/2009 1331   GFRNONAA >60 09/14/2009 0510   GFRAA  09/14/2009 0510    >60        The eGFR has been calculated using the MDRD equation. This calculation has not been validated in all clinical situations. eGFR's persistently <60 mL/min signify possible Chronic Kidney Disease.   Lab Results  Component Value Date   INR 1.34 09/24/2009   INR 1.53* 09/15/2009   INR 1.19 09/14/2009   No results found for: PTT  SOCIAL HISTORY: Social History   Social History  . Marital Status: Married    Spouse Name: N/A  . Number of Children: 3  . Years of Education: N/A   Occupational History  . Not on file.   Social History Main Topics  . Smoking status: Never Smoker   . Smokeless tobacco: Former Systems developer    Types: Loss adjuster, chartered  Quit date: 11/20/2006  . Alcohol Use: 0.0 oz/week    0 Standard drinks or equivalent per week     Comment: He reports he is a social drinker  . Drug Use: No  . Sexual Activity: Not on file   Other Topics Concern  . Not on file   Social History Narrative    FAMILY HISTORY: Family History  Problem Relation Age of Onset  . Cancer Father     esophageal ca  . Cancer Maternal Aunt     breast ca  . Cancer Maternal Grandmother     brain ca  . Cancer Maternal Grandfather     lung ca    REVIEW OF SYSTEMS: Constitutional: Denies fevers, chills or abnormal night sweats.  Eyes: Denies blurriness of vision, double vision or watery eyes. + reading glasses. Respiratory: Denies cough,  dyspnea or wheezes, + sinus drainage Cardiovascular: Denies palpitation, chest discomfort or lower extremity swelling Gastrointestinal: Denies nausea, heartburn or change in bowel habits Skin: Denies abnormal skin rashes Neurological:Denies numbness, tingling or new weaknesses Behavioral/Psych: Mood is stable, no new changes Urogenital: H/O kidney stones-no problems with prostate or kidneys now. Heme: H/O pulmonary embolism, no problems with bleeding/clotting disorder now. All other systems were reviewed with the patient and are negative.  DENTAL HISTORY: CHIEF COMPLAINT: Patient referred by Dr. Isidore Moos for a dental consultation.   HPI: Gregory Jenkins  is a 65 year old male recently diagnosed with squamous cell carcinoma of the left tonsil. Patient with anticipated chemoradiation therapy. Patient now seen as part of a medically necessary pre-chemoradiation therapy dental protocol examination.  Patient currently denies acute toothaches, swellings, or abscesses. Patient was recently seen for a cleaning on 08/27/2015 as well as dental restoration completed on 10/03/2015. This is with his primary dentist, Dr. Renelda Mom. Patient is usually seen on every 6 month basis. Next dental cleaning is scheduled for 03/02/2016. The patient denies having any partial dentures.   DENTAL EXAMINATION: GENERAL: The patient is a well-developed, well-nourished male in no acute distress. HEAD AND NECK: There is right and left neck palpable lymphadenopathy. The patient denies acute TMJ symptoms. INTRAORAL EXAM: Patient has normal saliva. Patient has bilateral mandibular lingual tori. There is no evidence of oral abscess formation. DENTITION: Patient is missing tooth numbers 1, 4, 13, 16, 17, 24, 30, 31, and 32. There is mandibular anterior incisal attrition. PERIODONTAL: Patient has chronic periodontitis with plaque accumulations, gingival recession, and incipient mandibular anterior tooth mobility as  charted. DENTAL CARIES/SUBOPTIMAL RESTORATIONS: Dental caries #18 is noted on the distobuccal. The margins of the PFM crowns on tooth numbers 2 on the mesial, 14 on the distal, and 19 on the distal need to be evaluated for possible replacement of the crowns in the future. ENDODONTIC: Patient denies symptoms of acute pulpitis. There is no obvious periapical pathology. The patient has had previous root canal therapies associated with tooth numbers 2, 10,  and 19 with no apparent symptoms or persistent periapical pathology. CROWN AND BRIDGE: Patient has PFM crowns on tooth numbers 2, 7, 8, 9, 10, 12, 14, and 19. The margins of the PFM crowns on tooth numbers 2 on the mesial, 14 on the distal, and 19 on the distal need to be evaluated for possible replacement of the crowns in the future.  Patient can also be evaluated for crown restorations on tooth numbers 15 and 18 in the future. PROSTHODONTIC: The patient has no partial dentures. OCCLUSION: The patient has a poor occlusal scheme secondary to multiple missing  teeth, supra-eruption and drifting of the unopposed teeth into the edentulous areas, and mandibular anterior incisal attrition, and lack of replacement of all missing teeth with dental prostheses. The occlusion is stable at this time, however.  RADIOGRAPHIC INTERPRETATION: An orthopantogram was taken and supplemented with 13 periapical radiograph's and 2 bitewings. There are multiple missing teeth. There is supra-eruption and drifting of the unopposed teeth into the edentulous areas. There is incipient to moderate bone loss. Dental caries are noted on tooth #18. There are multiple amalgam, resin, and crown restorations noted. There are previous root canal therapies associated with tooth numbers 2, 10, and 19. There are radiopacities in the mandibular arch consistent with bilateral mandibular lingual tori. The margins on the crowns of tooth numbers 2, 14, and 19 appear to be less than ideal and need to be  followed for replacement in the future as indicated.  ASSESSMENTS: 1. History of squamous cell carcinoma of the left tonsil 2. Pre-chemoradiation therapy dental protocol examination 3. Dental caries #18 4. Mandibular anterior interincisal attrition 5. Chronic periodontitis of bone loss 6. Gingival recession 7. Accretions-minimal 8. Incipient mandibular anterior tooth mobility 9. Multiple missing teeth 10. Supra-eruption and drifting of the unopposed teeth into the edentulous areas 11. Poor occlusal scheme but a stable occlusion 12. Suboptimal crown margins on tooth numbers 2, 14, and 19.   PLAN/RECOMMENDATIONS: 1. I discussed the risks, benefits, and complications of various treatment options with the patient in relationship to his medical and dental conditions, anticipated chemoradiation therapy and chemoradiation therapy side effects to include xerostomia, radiation caries, trismus, mucositis, taste changes, gum and jawbone changes, and risk for infection and osteoradionecrosis. We discussed various treatment options to include no treatment, extraction of tooth numbers 15 and 18 as teeth in the primary field of radiation therapy, pre-prosthetic surgery as indicated, periodontal therapy, dental restorations, root canal therapy, crown and bridge therapy, implant therapy, and replacement of missing teeth as indicated. The patient currently does NOT wish to proceed with any dental extractions or mandibular tori reductions.  Patient did agree to proceed with impressions today for the fabrication of fluoride trays and scatter protection devices. Patient also agreed to follow-up with his primary dentist, Dr. Renelda Mom, for dental restoration on tooth #18 and future crown restorations as indicated once stable from the anticipated chemoradiation therapy. A prescription for FluoriSHIELD was given to the patient today for use in the fluoride trays with PRN refills. Patient is to return to Dental  Medicine for insertion of the fluoride trays and scatter guards as soon as possible.  2. Discussion of findings with medical team and coordination of future medical and dental care as needed.  I spent in excess of 120 minutes during the conduct of this consultation and >50% of this time involved direct face-to-face encounter for counseling and/or coordination of the patient's care.    Lenn Cal, DDS

## 2015-12-04 ENCOUNTER — Telehealth: Payer: Self-pay | Admitting: *Deleted

## 2015-12-04 ENCOUNTER — Encounter (HOSPITAL_COMMUNITY): Payer: Self-pay | Admitting: Dentistry

## 2015-12-04 ENCOUNTER — Other Ambulatory Visit: Payer: Self-pay | Admitting: Hematology and Oncology

## 2015-12-04 ENCOUNTER — Other Ambulatory Visit: Payer: Self-pay

## 2015-12-04 ENCOUNTER — Ambulatory Visit (HOSPITAL_COMMUNITY): Payer: Self-pay | Admitting: Dentistry

## 2015-12-04 ENCOUNTER — Ambulatory Visit
Admission: RE | Admit: 2015-12-04 | Discharge: 2015-12-04 | Disposition: A | Discharge status: Home / Self Care | Payer: 59 | Source: Ambulatory Visit | Attending: Radiation Oncology | Admitting: Radiation Oncology

## 2015-12-04 VITALS — BP 115/66 | HR 67 | Temp 97.6°F

## 2015-12-04 DIAGNOSIS — Z463 Encounter for fitting and adjustment of dental prosthetic device: Secondary | ICD-10-CM

## 2015-12-04 DIAGNOSIS — C099 Malignant neoplasm of tonsil, unspecified: Secondary | ICD-10-CM

## 2015-12-04 DIAGNOSIS — Z01818 Encounter for other preprocedural examination: Secondary | ICD-10-CM

## 2015-12-04 LAB — BUN AND CREATININE (CC13)
BUN: 17.8 mg/dL (ref 7.0–26.0)
Creatinine: 1.4 mg/dL — ABNORMAL HIGH (ref 0.7–1.3)
EGFR: 53 mL/min/{1.73_m2} — ABNORMAL LOW (ref >90)

## 2015-12-04 MED FILL — FLUORISHIELD 1.1% GEL: 1.1 % | 30 days supply | Qty: 114 | Fill #0

## 2015-12-04 NOTE — Telephone Encounter (Signed)
  Oncology Nurse Navigator Documentation  Navigator Location: [facility] (12/04/15 1240) Navigator Encounter Type: Telephone;Other (12/04/15 1240) Telephone: [place] Cro[place] Call;Appt Confirmation/Clarification (12/04/15 1240)       Met patient's wife at [facility] entrance, provided her Epic calendar of upcoming appts, provided clarification of arrival time for next Tuesday's MDC, explanation of this Friday's CT SIM.  She provided me a copy of his HCPOA which I delivered to [employer] for scanning.  Gayleen Orem, RN, BSN, [place] at [place] [phone]                                     Time Spent with Patient: 15 (12/04/15 1240)

## 2015-12-04 NOTE — Patient Instructions (Signed)

## 2015-12-04 NOTE — Telephone Encounter (Signed)
  Oncology Nurse Navigator Documentation  Navigator Location: [facility] (12/04/15 0840) Navigator Encounter Type: Telephone (12/04/15 0840) Telephone: Jerilee Hoh Confirmation/Clarification (12/04/15 0840)             Barriers/Navigation Needs: Coordination of Care (12/04/15 0840)   Interventions: Coordination of Care (12/04/15 0840)       Coordinated scheduling of patient's CT SIM for 0900 this Friday with 0815 IV Start and BUN/Creatinine lab today at 1030 following his 0900 appt with Dental Medicine.  Called patient to inform of appts, he verbalized understanding.  Gayleen Orem, RN, BSN, [place] at [place] [phone]                   Time Spent with Patient: 30 (12/04/15 0840)

## 2015-12-04 NOTE — Telephone Encounter (Signed)
  Oncology Nurse Navigator Documentation  Navigator Location: [facility] (12/04/15 1630) Navigator Encounter Type: Telephone (12/04/15 1630) Telephone: Incoming Call;Appt Confirmation/Clarification (12/04/15 1630)       Patient's wife called to confirm recently added 2/14 lab and Dr Alvy Bimler appts, 2/16 PAC and PEG.  She noted she will be accompanying him for the 2/14 and 2/16 appts.  Gayleen Orem, RN, BSN, [place] at [place] [phone]                                       Time Spent with Patient: 15 (12/04/15 1630)

## 2015-12-04 NOTE — Progress Notes (Signed)
12/04/2015  Patient Name:   Gregory Jenkins Date of Birth:   Jan 06, 1951 Medical Record Number: [id number]  BP 115/66 mmHg  Pulse 67  Temp(Src) 97.6 F (36.4 C) (Oral)  [name] now presents for insertion of upper and lower fluoride trays and scatter protection devices. Patient is scheduled for dental restoration later this afternoon with his primary dentist.  PROCEDURE: Appliances were tried in and adjusted as needed. [place] Trismus device was fabricated 45 mm using 26 sticks. Postop instructions were provided and a written and verbal format concerning the use and care of appliances. All questions were answered. Patient to return to clinic for periodic oral examination in approximately 2-3 weeks during radiation therapy. Patient to call if questions or problems arise before then.  Lenn Cal, DDS

## 2015-12-04 NOTE — Telephone Encounter (Signed)
Per staff message and POF I have scheduled appts. Advised scheduler of appts. JMW  

## 2015-12-06 ENCOUNTER — Ambulatory Visit
Admission: RE | Admit: 2015-12-06 | Discharge: 2015-12-06 | Disposition: A | Discharge status: Home / Self Care | Payer: 59 | Source: Ambulatory Visit | Attending: Radiation Oncology | Admitting: Radiation Oncology

## 2015-12-06 ENCOUNTER — Encounter: Payer: Self-pay | Admitting: *Deleted

## 2015-12-06 DIAGNOSIS — Z510 Encounter for antineoplastic radiation therapy: Secondary | ICD-10-CM | POA: Diagnosis not present

## 2015-12-06 DIAGNOSIS — C090 Malignant neoplasm of tonsillar fossa: Secondary | ICD-10-CM | POA: Insufficient documentation

## 2015-12-06 NOTE — Progress Notes (Signed)
Head and Neck Cancer Simulation, IMRT treatment planning, and Special treatment procedure note   Outpatient  Diagnosis:    ICD-9-CM ICD-10-CM   1. 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 An Aquaplast head and shoulder mask was custom fitted to the patient's anatomy. High-resolution CT axial imaging was obtained of the head and neck without contrast due to Cr function. I verified that the quality of the imaging is good for treatment planning. 1 Medically Necessary Treatment Device was fabricated and supervised by me: Aquaplast mask.   Treatment planning note I plan to treat the patient with IMRT. I plan to treat the patient's tumor and bilateral neck nodes. I plan to treat to a total dose of 70 Gray in 35  fractions. Dose calculation was ordered from dosimetry.  IMRT planning Note  IMRT is an important modality to deliver adequate dose to the patient's at risk tissues while sparing the patient's normal structures, including the: esophagus, parotid tissue, mandible, brain stem, spinal cord, oral cavity, brachial plexus.  This justifies the use of IMRT in the patient's treatment.   Special Treatment Procedure Note:  The patient will be receiving chemotherapy concurrently. Chemotherapy heightens the risk of side effects. I have considered this during the patient's treatment planning process and will monitor the patient accordingly for side effects on a weekly basis. Concurrent chemotherapy increases the complexity of this patient's treatment and therefore this constitutes a special treatment procedure.  -----------------------------------  Eppie Gibson, MD

## 2015-12-09 ENCOUNTER — Telehealth: Payer: Self-pay | Admitting: *Deleted

## 2015-12-09 NOTE — Progress Notes (Signed)
  Oncology Nurse Navigator Documentation  Navigator Location: [facility] (12/06/15 0815) Navigator Encounter Type: Clinic/MDC (12/06/15 0815)             Treatment Phase: Pre-Tx/Tx Discussion (12/06/15 0815) Barriers/Navigation Needs: No barriers at this time (12/06/15 0815)   Interventions: None required (12/06/15 0815)       To provide support and encouragement, care continuity and to assess for needs, met with patient prior to and during his CT SIM. He completed procedure without difficulty. I reviewed appts for next week, provided further clarification of format for next Tuesday morning's MDC.   Gayleen Orem, RN, BSN, [place] at [place] [phone]                    Time Spent with Patient: 15 (12/06/15 0815)

## 2015-12-09 NOTE — Telephone Encounter (Signed)
  Oncology Nurse Navigator Documentation  Navigator Location: [facility] (12/09/15 1424) Navigator Encounter Type: Telephone (12/09/15 1424) Telephone: Appt Confirmation/Clarification;Outgoing Call (12/09/15 1424)       Spoke with patient to confirm his H&N [place] attendance tomorrow morning.  He confirmed, stated he will arrive and register by 7:45 AM.  Gayleen Orem, RN, BSN, [place] [facility] [place] [phone]                                     Time Spent with Patient: 15 (12/09/15 1424)

## 2015-12-10 ENCOUNTER — Ambulatory Visit
Admission: RE | Admit: 2015-12-10 | Discharge: 2015-12-10 | Disposition: A | Discharge status: Home / Self Care | Payer: 59 | Source: Ambulatory Visit | Attending: Radiation Oncology | Admitting: Radiation Oncology

## 2015-12-10 ENCOUNTER — Other Ambulatory Visit: Payer: Self-pay

## 2015-12-10 ENCOUNTER — Encounter: Payer: Self-pay | Admitting: *Deleted

## 2015-12-10 ENCOUNTER — Other Ambulatory Visit (HOSPITAL_BASED_OUTPATIENT_CLINIC_OR_DEPARTMENT_OTHER): Payer: 59

## 2015-12-10 ENCOUNTER — Encounter: Payer: Self-pay | Admitting: Hematology and Oncology

## 2015-12-10 ENCOUNTER — Ambulatory Visit: Payer: 59 | Admitting: Nutrition

## 2015-12-10 ENCOUNTER — Ambulatory Visit: Payer: 59 | Attending: Radiation Oncology | Admitting: Physical Therapy

## 2015-12-10 ENCOUNTER — Ambulatory Visit: Payer: 59

## 2015-12-10 ENCOUNTER — Other Ambulatory Visit: Payer: Self-pay | Admitting: Hematology and Oncology

## 2015-12-10 ENCOUNTER — Telehealth: Payer: Self-pay | Admitting: Hematology and Oncology

## 2015-12-10 ENCOUNTER — Ambulatory Visit (HOSPITAL_BASED_OUTPATIENT_CLINIC_OR_DEPARTMENT_OTHER): Payer: 59 | Admitting: Hematology and Oncology

## 2015-12-10 VITALS — BP 109/65 | HR 52 | Temp 97.9°F | Resp 18 | Ht 71.0 in | Wt 202.4 lb

## 2015-12-10 VITALS — BP 103/63 | HR 64 | Temp 98.6°F | Wt 203.2 lb

## 2015-12-10 DIAGNOSIS — R1310 Dysphagia, unspecified: Secondary | ICD-10-CM | POA: Insufficient documentation

## 2015-12-10 DIAGNOSIS — E875 Hyperkalemia: Secondary | ICD-10-CM | POA: Diagnosis not present

## 2015-12-10 DIAGNOSIS — M2550 Pain in unspecified joint: Secondary | ICD-10-CM | POA: Diagnosis present

## 2015-12-10 DIAGNOSIS — R29898 Other symptoms and signs involving the musculoskeletal system: Secondary | ICD-10-CM | POA: Insufficient documentation

## 2015-12-10 DIAGNOSIS — T50905A Adverse effect of unspecified drugs, medicaments and biological substances, initial encounter: Secondary | ICD-10-CM

## 2015-12-10 DIAGNOSIS — C099 Malignant neoplasm of tonsil, unspecified: Secondary | ICD-10-CM | POA: Diagnosis not present

## 2015-12-10 DIAGNOSIS — E785 Hyperlipidemia, unspecified: Secondary | ICD-10-CM | POA: Diagnosis not present

## 2015-12-10 LAB — COMPREHENSIVE METABOLIC PANEL
ALBUMIN: 3.6 g/dL (ref 3.5–5.0)
ALK PHOS: 81 U/L (ref 40–150)
ALT: 21 U/L (ref 0–55)
AST: 19 U/L (ref 5–34)
Anion Gap: 7 mEq/L (ref 3–11)
BILIRUBIN TOTAL: 0.73 mg/dL (ref 0.20–1.20)
BUN: 17.1 mg/dL (ref 7.0–26.0)
CO2: 28 meq/L (ref 22–29)
Calcium: 10.1 mg/dL (ref 8.4–10.4)
Chloride: 104 mEq/L (ref 98–109)
Creatinine: 1.2 mg/dL (ref 0.7–1.3)
EGFR: 61 mL/min/{1.73_m2} — ABNORMAL LOW (ref >90)
GLUCOSE: 104 mg/dL (ref 70–140)
Potassium: 5.4 mEq/L — ABNORMAL HIGH (ref 3.5–5.1)
SODIUM: 138 meq/L (ref 136–145)
TOTAL PROTEIN: 7.4 g/dL (ref 6.4–8.3)

## 2015-12-10 LAB — CBC WITH DIFFERENTIAL/PLATELET
BASO%: 0.6 % (ref 0.0–2.0)
Basophils Absolute: 0 10*3/uL (ref 0.0–0.1)
EOS%: 1.2 % (ref 0.0–7.0)
Eosinophils Absolute: 0.1 10*3/uL (ref 0.0–0.5)
HCT: 44 % (ref 38.4–49.9)
HEMOGLOBIN: 14.6 g/dL (ref 13.0–17.1)
LYMPH%: 26.7 % (ref 14.0–49.0)
MCH: 30.9 pg (ref 27.2–33.4)
MCHC: 33.1 g/dL (ref 32.0–36.0)
MCV: 93.5 fL (ref 79.3–98.0)
MONO#: 0.6 10*3/uL (ref 0.1–0.9)
MONO%: 9.8 % (ref 0.0–14.0)
NEUT%: 61.7 % (ref 39.0–75.0)
NEUTROS ABS: 3.5 10*3/uL (ref 1.5–6.5)
Platelets: 203 10*3/uL (ref 140–400)
RBC: 4.71 10*6/uL (ref 4.20–5.82)
RDW: 13.3 % (ref 11.0–14.6)
WBC: 5.7 10*3/uL (ref 4.0–10.3)
lymph#: 1.5 10*3/uL (ref 0.9–3.3)

## 2015-12-10 LAB — MAGNESIUM: Magnesium: 2.7 mg/dl — ABNORMAL HIGH (ref 1.5–2.5)

## 2015-12-10 MED ORDER — LIDOCAINE-PRILOCAINE 2.5-2.5 % EX CREA: TOPICAL_CREAM | CUTANEOUS | Status: DC

## 2015-12-10 MED ORDER — PROMETHAZINE HCL 25 MG PO TABS: 25.0000 mg | ORAL_TABLET | Freq: Four times a day (QID) | ORAL | Status: DC | PRN

## 2015-12-10 MED ORDER — ONDANSETRON HCL 8 MG PO TABS: 8.0000 mg | ORAL_TABLET | Freq: Three times a day (TID) | ORAL | Status: DC | PRN

## 2015-12-10 NOTE — Therapy (Signed)
[place], [place], [address] Phone: [phone]   Fax:  [phone]  Physical Therapy Evaluation  Patient Details  Name: Gregory Jenkins MRN: [id number] Date of Birth: 1951-05-28 Referring Provider: Dr. Eppie Gibson  Encounter Date: 12/10/2015      PT End of Session - 12/10/15 1348    Visit Number 1   Number of Visits 1   PT Start Time 0900   PT Stop Time 0925   PT Time Calculation (min) 25 min   Activity Tolerance Patient tolerated treatment well   Behavior During Therapy [facility] for tasks assessed/performed      Past Medical History  Diagnosis Date  . Allergic rhinitis   . Hypertension   . Thyroid disease   . Clotting disorder (HCC)     PE, suspect lupus anticoagulant  . History of pulmonary embolism   . History of nephrolithiasis     Past Surgical History  Procedure Laterality Date  . Eye surgery Right 1980's    H/O right orbital "blowout" fracture  . Foot surgery Left     [place] Neuroma  . Knee surgery Right     Arthroscopic    There were no vitals filed for this visit.  Visit Diagnosis:  Decreased ROM of neck - Plan: PT plan of care cert/re-cert  Pain in joint, multiple sites - Plan: PT plan of care cert/re-cert      Subjective Assessment - 12/10/15 1339    Subjective reports pains "from being 65 years old" in most joints   Patient is accompained by: Family member  wife, Beth   Pertinent History Patient presented with left neck mass 2 months ago.  Biopsy 1/17 indicated squamous cell carcinoma; 11/20/15 left tonsil squamous cell found.  CT scan indicated neck adenopathy left > right.   Patient Stated Goals get info from all head & neck clinic providers   Currently in Pain? Yes   Pain Score 3    Pain Location Other (Comment)  shoulders, back, knees, wrist, ankle   Pain Orientation Right;Left   Pain Descriptors / Indicators Other (Comment)  good days and bad days   Aggravating Factors  nothing; stiff after sitting a while   Pain Relieving Factors nothing            [facility] PT Assessment - 12/10/15 1342    Assessment   Medical Diagnosis squamous cell carcinoma of left neck/tonsil   Referring Provider Dr. Eppie Gibson   Onset Date/Surgical Date 11/20/15   Precautions   Precautions Other (comment)   Precaution Comments cancer precautions   Restrictions   Weight Bearing Restrictions No   Balance Screen   Has the patient fallen in the past 6 months No   Has the patient had a decrease in activity level because of a fear of falling?  No   Is the patient reluctant to leave their home because of a fear of falling?  No   Home Environment   Living Environment Private residence   Living Arrangements Spouse/significant other   Type of [place] Two level;Bed/bath upstairs   Additional Comments Has 15 acres and takes care of animals, yardwork, pool, building a log cabin   Prior Function   Level of [place] Retired   Leisure see above for activity; no other regular exercise   Cognition   Overall Cognitive Status Within Functional Limits for tasks assessed   Functional Tests   Functional  tests Sit to Stand   Sit to Stand   Comments 15 times in 30 seconds, slightly above average   Posture/Postural Control   Posture/Postural Control No significant limitations   Posture Comments posture is quite good   ROM / Strength   AROM / PROM / Strength AROM   AROM   Overall AROM Comments neck flexion WFL, extension 25% limited; other motions 15% limited bilat.  Shoulder AROM WFL with slight limitations (normal for age)   Ambulation/Gait   Ambulation/Gait Yes   Ambulation/Gait Assistance 7: Independent   Assistive device None           LYMPHEDEMA/ONCOLOGY QUESTIONNAIRE - 11-Dec-2015 1352    Lymphedema Assessments   Lymphedema Assessments Head and Neck   Head and Neck   4 cm superior to sternal notch around neck  39.8 cm   6 cm superior to sternal notch around neck 39.6 cm   8 cm superior to sternal notch around neck 40 cm   Other at 10 cm. proximal to sternal notch, 41.2 cm.                        PT Education - 12-11-15 1348    Education provided Yes   Education Details neck ROM, posture, walking or other deliberate cardio exercise, staying active through treatment, lymphedema and PT info   Person(s) Educated Patient;Spouse   Methods Explanation;Handout   Comprehension Verbalized understanding                 Head and Neck Clinic Goals - December 11, 2015 1351    Patient will be able to verbalize understanding of a home exercise program for cervical range of motion, posture, and walking.    Status Achieved   Patient will be able to verbalize understanding of proper sitting and standing posture.    Status Achieved   Patient will be able to verbalize understanding of lymphedema risk and availability of treatment for this condition.    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   Pt will benefit from skilled therapeutic intervention in order to improve on the following deficits Decreased range of motion   Rehab Potential Good   Clinical Impairments Affecting Rehab Potential will start radiation treatments soon   PT Frequency One time visit   PT Treatment/Interventions Patient/family education   PT Next Visit Plan none at this time   PT Home Exercise Plan neck ROM, posture, walking or other exercise   Consulted and Agree with Plan of Care Patient          G-Codes - Dec 11, 2015 1351    Functional Assessment Tool Used clinical judgement   Functional Limitation Changing and maintaining body position   Changing and Maintaining Body  Position Current Status [id number]) At least 1 percent but less than 20 percent impaired, limited or restricted   Changing and Maintaining Body Position Goal Status [id number]) At least 1 percent but less than 20 percent impaired, limited or restricted   Changing and Maintaining Body Position Discharge Status [id number]) At least 1 percent but less than 20 percent impaired, limited or restricted       Problem List Patient Active Problem List   Diagnosis Date Noted  .  Drug-induced hyperkalemia 12/10/2015  . Carcinoma of tonsillar fossa ([place]) 12/06/2015  . Tonsil cancer ([place]) 11/25/2015    SALISBURY,DONNA 12/10/2015, 1:55 PM  [place], [place], [address] Phone: [phone]   Fax:  [phone]  Name: BONNY PASTRANA MRN: [id number] Date of Birth: 09-22-1951   Serafina Royals, PT 12/10/2015 1:55 PM

## 2015-12-10 NOTE — Assessment & Plan Note (Signed)
He had mild hyperkalemia, likely related to his blood pressure medication, Cozaar. His blood pressure is low. Recommend discontinuation of Cozaar for now and increase oral fluid hydration

## 2015-12-10 NOTE — Telephone Encounter (Signed)
Pt confirmed labs/ov per 02/14 POF, gave pt AVS and Calendar... KJ °

## 2015-12-10 NOTE — Progress Notes (Signed)
Head & Neck Multidisciplinary Clinic Clinical Social Work  Clinical Social Work met with patient/family  at head & neck multidisciplinary clinic to offer support and assess for psychosocial needs.  Mr. Kortz was accompanied by his spouse, Eustaquio Maize.  Mr. Herter shared he is retired, but enjoys "staying busy" tending to his animals, working on his cabin, and managing his property.  Mrs. Kroon shared they have a large support system consisting of her work Medical laboratory scientific officer, family, and church group.  CSW discussed importance of support for both patient and caregiver throughout this process and common emotional reactions to treatment.  CSW strongly encouraged patient/spouse to contact CSW if they feel overwhelmed, anxious, or sad.  ONCBCN DISTRESS SCREENING 11/25/2015  Screening Type Initial Screening  Distress experienced in past week (1-10) 2  Family Problem type Other (comment)  Physical Problem type Pain;Mouth sores/swallowing  Physician notified of physical symptoms Yes  Referral to clinical social work No    Clinical Social Work briefly discussed [place] Work role and [employer] support programs/services.  Clinical Social Work encouraged patient to call with any additional questions or concerns.   Polo Riley, MSW, LCSW, OSW-C Clinical Social Worker [facility] [phone]

## 2015-12-10 NOTE — Progress Notes (Signed)
Patient was seen during head and neck clinic  65 year old male diagnosed with tonsil cancer.  He is a patient of Dr. Isidore Moos and Dr. Alvy Bimler.  Past medical history includes chewing tobacco, hypertension, and thyroid disease.  Medications include Synthroid.  Labs were reviewed.  Height: 5 feet 11 inches. Weight: 203 pounds on February 14. Usual body weight: 190 pounds. BMI: 28.35.  Patient denies nutrition impact symptoms. He reports he will be receiving weekly Erbitux and daily radiation for 7 weeks. Patient's wife had questions about additional vitamins and minerals and holistic nutrition therapies. Patient is scheduled for G-tube placement on Thursday, February 16.  Nutrition diagnosis:  Predicted suboptimal energy intake related to new diagnosis of tonsil cancer and associated treatments as evidenced by history or presence of this condition for which research shows an increased incidence of suboptimal energy intake.      Intervention: Educated patient on the importance of small frequent meals utilizing high-calorie high-protein foods. Reviewed importance of maintaining lean body mass. Provided fact sheets on increasing calories and protein. Encouraged patient to get vitamins and minerals from foods. Will monitor for need to begin enteral nutrition.  Will educate patient on enteral nutrition infusion. Questions were answered.  Teach back method used.  Contact information provided.  Monitoring, evaluation, goals: Patient will tolerate increased calories and protein to promote maintenance of lean body mass.  Next visit: Wednesday, February 22, during infusion.  **Disclaimer: This note was dictated with voice recognition software. Similar sounding words can inadvertently be transcribed and this note may contain transcription errors which may not have been corrected upon publication of note.**

## 2015-12-10 NOTE — Patient Instructions (Signed)
SWALLOWING EXERCISES Do these 6 of the 7 days per week until 6 months after your last radiation day, then 3 times per week afterwards  1. Effortful Swallows - Press your tongue hard against the roof of your mouth, then squeeze hard with the muscles in your neck while you swallow your  saliva or a sip of water - Repeat 15-20 times, 2-3 times a day, and use whenever you eat or drink  2. Masako Swallow - swallow with your tongue sticking out - Stick tongue out past your teeth and gently bite tongue with your teeth - Swallow, while holding your tongue with your teeth - Repeat 15-20 times, 2-3 times a day *use a wet spoon if your mouth gets dry*  3. Pitch Raise - Repeat "he", once per second in as high of a pitch as you can - Repeat 15-20 times, 2-3 times a day  4. Shaker Exercise - head lift - Lie flat on your back in your bed or on a couch without pillows - Raise your head and look at your feet - KEEP YOUR SHOULDERS DOWN - HOLD FOR 45-60 SECONDS, then lower your head back down - Repeat 3 times, 2-3 times a day  5. Mendelsohn Maneuver - "half swallow" exercise - Start to swallow, and keep your Adam's apple up by squeezing hard with the muscles of the throat - Hold the squeeze for 5-7 seconds and then relax - Repeat 20 times, 2-3 times a day *use a wet spoon if your mouth gets dry*  6. Breath Hold - Say "HUH!" loudly, then hold your breath for 3 seconds at your voice box - Repeat 20 times, 2-3 times a day  7. Chin pushback - Open your mouth  - Place your fist UNDER your chin near your neck, and push back with your fist for 5 seconds - Repeat 10 times, 2-3 times a day        8.  Pool noodle crunch  - hold a pool noodle under your chin for 60 seconds, 3 times, 2-3 times a day  - hold the pool noodle with your hands under your chin, and press down hard for one second, 30 times. Do 2-3 times a day  -

## 2015-12-10 NOTE — Assessment & Plan Note (Signed)
The decision was made based on recent publication on NEJM. Role of treatment is curative and it is category 1 recommendation according to NCCN guidelines.  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 Shara Blazing, M.D., Carey Bullocks. Graylon Good, M.D., Trilby Leaver, M.D., Gweneth Dimitri, Ph.D., [name] Sports. Vernard Gambles, M.D., Blair Hailey. Patrice Paradise, M.D., Terese Door, M.D., [name], M.D., Ph.D., Kirk Ruths, M.D., Jonah Blue, M.D., Ph.D., Lennie Odor, M.D., Ph.D., Nelly Rout, M.D., Cameron Ali, M.D., Coralee North, M.D., Rana Snare, M.D., Kerry Hough. Rowinsky, M.D., and Clemon Chambers Ang, M.D., Ph.D.* [facility] Med 2006; 354:567-578February 9, 2006DOI: 10.1056/[id number]  The epidermal growth factor receptor (EGFR), a member of the ErbB family of receptor tyrosine kinases, is abnormally activated in epithelial cancers, including head and neck cancer. The cells of almost all such neoplasms express high levels of EGFR, a feature associated with a poor clinical outcome. Radiation increases the expression of EGFR in cancer cells, and blockade of EGFR signaling sensitizes cells to the effects of radiation.  The administration of intravenous cetuximab was initiated one week before radiotherapy at a loading dose of 400 mg per square meter of body-surface area over a period of 120 minutes, followed by weekly 60-minute infusions of 250 mg per square meter for the duration of radiotherapy.  The median duration of locoregional control was 24.4 months among patients treated with cetuximab plus radiotherapy and 14.9 months among those given radiotherapy alone (hazard ratio for locoregional progression or death, 0.68; P=0.005). With a median follow-up of 54.0 months, the median duration of overall survival was 49.0 months among patients treated with combined therapy and 29.3 months among those treated with radiotherapy alone (hazard ratio for death, 0.74; P=0.03). Radiotherapy plus cetuximab  significantly prolonged progression-free survival (hazard ratio for disease progression or death, 0.70; P=0.006). With the exception of acneiform rash and infusion reactions, the incidence of grade 3 or greater toxic effects, including mucositis, did not differ significantly between the two groups.  We discussed some of the risks, benefits, side-effects of Erbitux/cetuximab.  Some of the short term side-effects included, though not limited to, risk of fatigue, pancytopenia, life-threatening infections, allergic reactions, nausea, vomiting, sores in the mouth, changes in bowel habits especially diarrhea, admission to hospital for various reasons, and risks of death.   The patient is aware that the response rates discussed earlier is not guaranteed.    After a long discussion, patient made an informed decision to proceed.   Patient education material was dispensed.  I will start seeing him on a weekly basis after starting treatment next week

## 2015-12-10 NOTE — Progress Notes (Signed)
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 Mild asymmetry of the left tonsil -- correlate with direct visualization to assess for primary neoplasm.   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   11/28/2015 PET scan 1. Left palatine tonsil primary with left worse than right cervical nodal metastasis. 2. No extracervical hypermetabolic metastasis.     INTERVAL HISTORY: Please see below for problem oriented charting.  he returns for chemotherapy consent. He denies problem with swallowing or pain.  REVIEW OF SYSTEMS:   Constitutional: Denies fevers, chills or abnormal weight loss Eyes: Denies blurriness of vision Ears, nose, mouth, throat, and face: Denies mucositis or sore throat Respiratory: Denies cough, dyspnea or wheezes Cardiovascular: Denies palpitation, chest discomfort or lower extremity swelling Gastrointestinal:  Denies nausea, heartburn or change in bowel habits Skin: Denies abnormal skin rashes Lymphatics: Denies new lymphadenopathy or easy bruising Neurological:Denies numbness, tingling or new weaknesses Behavioral/Psych: Mood is stable, no new changes  All other systems were reviewed with the patient and are negative.  I have reviewed the past medical  history, past surgical history, social history and family history with the patient and they are unchanged from previous note.  ALLERGIES:  has No Known Allergies.  MEDICATIONS:  Current Outpatient Prescriptions  Medication Sig Dispense Refill  . aspirin EC 81 MG tablet Take 81 mg by mouth daily.    Marland Kitchen ibuprofen (ADVIL,MOTRIN) 200 MG tablet Take 200 mg by mouth as needed.    Marland Kitchen levothyroxine (SYNTHROID, LEVOTHROID) 75 MCG tablet Take 75 mcg by mouth daily before breakfast.    . losartan (COZAAR) 25 MG tablet Take 25 mg by mouth every morning.  2  . sodium fluoride (FLUORISHIELD) 1.1 % GEL dental gel Instill one drop of gel per tooth space of fluoride tray. Place over teeth for 5 minutes. Remove. Spit out excess. Repeat nightly. 120 mL PRN  . lidocaine-prilocaine (EMLA) cream Apply to affected area once 30 g 3  . ondansetron (ZOFRAN) 8 MG tablet Take 1 tablet (8 mg total) by mouth every 8 (eight) hours as needed for nausea. 30 tablet 3  . promethazine (PHENERGAN) 25 MG tablet Take 1 tablet (25 mg total) by mouth every 6 (six) hours as needed for nausea. 30 tablet 3   No current facility-administered medications for this visit.    PHYSICAL EXAMINATION: ECOG PERFORMANCE STATUS: 0 - Asymptomatic  Filed Vitals:   12/10/15 1104  BP: 109/65  Pulse: 52  Temp: 97.9 F (36.6 C)  Resp: 18   Filed Weights   12/10/15 1104  Weight: 202 lb 6.4 oz (91.808 kg)    GENERAL:alert, no distress and comfortable SKIN: skin color, texture, turgor are normal, no rashes or significant lesions EYES: normal, Conjunctiva are pink and non-injected, sclera clear OROPHARYNX:no exudate, no erythema and lips, buccal mucosa, and tongue normal  Musculoskeletal:no cyanosis of digits and no clubbing  NEURO: alert & oriented x 3 with fluent speech, no focal motor/sensory deficits  LABORATORY DATA:  I have reviewed the data as listed    Component Value Date/Time   NA 138 12/10/2015 1043   NA 137 09/24/2009 1331    K 5.4 No visable hemolysis* 12/10/2015 1043   K 4.9 09/24/2009 1331   CL 101 09/24/2009 1331   CO2 28 12/10/2015 1043   CO2 27 09/24/2009 1331   GLUCOSE 104 12/10/2015 1043   GLUCOSE 98 09/24/2009 1331   BUN 17.1 12/10/2015 1043   BUN 19 09/24/2009 1331   CREATININE 1.2 12/10/2015 1043   CREATININE 1.29 09/24/2009 1331   CALCIUM 10.1 12/10/2015 1043   CALCIUM 9.1 09/24/2009 1331   PROT 7.4 12/10/2015 1043   PROT 7.0 09/24/2009 1331   ALBUMIN 3.6 12/10/2015 1043   ALBUMIN 4.1 09/24/2009 1331   AST 19 12/10/2015 1043   AST 18 09/24/2009 1331   ALT 21 12/10/2015 1043   ALT 40 09/24/2009 1331   ALKPHOS 81 12/10/2015 1043   ALKPHOS 123* 09/24/2009 1331   BILITOT 0.73 12/10/2015 1043   BILITOT 0.3 09/24/2009 1331   GFRNONAA >60 09/14/2009 0510   GFRAA  09/14/2009 0510    >60        The eGFR has been calculated using the MDRD equation. This calculation has not been validated in all clinical situations. eGFR's persistently <60 mL/min signify possible Chronic Kidney Disease.    No results found for: SPEP, UPEP  Lab Results  Component Value Date   WBC 5.7 12/10/2015   NEUTROABS 3.5 12/10/2015   HGB 14.6 12/10/2015   HCT 44.0 12/10/2015   MCV 93.5 12/10/2015   PLT 203 12/10/2015      Chemistry      Component Value Date/Time   NA 138 12/10/2015 1043   NA 137 09/24/2009 1331   K 5.4 No visable hemolysis* 12/10/2015 1043   K 4.9 09/24/2009 1331   CL 101 09/24/2009 1331   CO2 28 12/10/2015 1043   CO2 27 09/24/2009 1331   BUN 17.1 12/10/2015 1043   BUN 19 09/24/2009 1331   CREATININE 1.2 12/10/2015 1043   CREATININE 1.29 09/24/2009 1331      Component Value Date/Time   CALCIUM 10.1 12/10/2015 1043   CALCIUM 9.1 09/24/2009 1331   ALKPHOS 81 12/10/2015 1043   ALKPHOS 123* 09/24/2009 1331   AST 19 12/10/2015 1043   AST 18 09/24/2009 1331   ALT 21 12/10/2015 1043   ALT 40 09/24/2009 1331   BILITOT 0.73 12/10/2015 1043   BILITOT 0.3 09/24/2009 1331        RADIOGRAPHIC STUDIES: I reviewed the PET CT scan with the patient and wife I have personally reviewed the radiological images as listed and agreed with the findings in the report.    ASSESSMENT & PLAN:  Tonsil cancer [facility]) The decision was made based on recent publication on NEJM. Role of treatment is curative and it is category 1 recommendation according to NCCN guidelines.  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 Shara Blazing, M.D., Carey Bullocks. Graylon Good, M.D., Trilby Leaver, M.D., Gweneth Dimitri, Ph.D., [name] Sports. Vernard Gambles, M.D., Blair Hailey. Patrice Paradise, M.D., Terese Door, M.D., [name] Circle, M.D., Ph.D., Kirk Ruths, M.D., Jonah Blue, M.D., Ph.D., Lennie Odor, M.D., Ph.D., Nelly Rout, M.D., Cameron Ali, M.D., Coralee North, M.D., Rana Snare, M.D., Kerry Hough. Phillips Odor, M.D., and Lebron Quam, M.D., Ph.D.* [facility] [employer] Med 2006; 017:494-496PRFFMBWG 9, 2006DOI:  10.1056/[id number]  The epidermal growth factor receptor (EGFR), a member of the ErbB family of receptor tyrosine kinases, is abnormally activated in epithelial cancers, including head and neck cancer. The cells of almost all such neoplasms express high levels of EGFR, a feature associated with a poor clinical outcome. Radiation increases the expression of EGFR in cancer cells, and blockade of EGFR signaling sensitizes cells to the effects of radiation.  The administration of intravenous cetuximab was initiated one week before radiotherapy at a loading dose of 400 mg per square meter of body-surface area over a period of 120 minutes, followed by weekly 60-minute infusions of 250 mg per square meter for the duration of radiotherapy.  The median duration of locoregional control was 24.4 months among patients treated with cetuximab plus radiotherapy and 14.9 months among those given radiotherapy alone (hazard ratio for locoregional progression or death, 0.68; P=0.005). With a median follow-up of 54.0 months,  the median duration of overall survival was 49.0 months among patients treated with combined therapy and 29.3 months among those treated with radiotherapy alone (hazard ratio for death, 0.74; P=0.03). Radiotherapy plus cetuximab significantly prolonged progression-free survival (hazard ratio for disease progression or death, 0.70; P=0.006). With the exception of acneiform rash and infusion reactions, the incidence of grade 3 or greater toxic effects, including mucositis, did not differ significantly between the two groups.  We discussed some of the risks, benefits, side-effects of Erbitux/cetuximab.  Some of the short term side-effects included, though not limited to, risk of fatigue, pancytopenia, life-threatening infections, allergic reactions, nausea, vomiting, sores in the mouth, changes in bowel habits especially diarrhea, admission to hospital for various reasons, and risks of death.   The patient is aware that the response rates discussed earlier is not guaranteed.    After a long discussion, patient made an informed decision to proceed.   Patient education material was dispensed.  I will start seeing him on a weekly basis after starting treatment next week   Drug-induced hyperkalemia  He had mild hyperkalemia, likely related to his blood pressure medication, Cozaar. His blood pressure is low. Recommend discontinuation of Cozaar for now and increase oral fluid hydration   No orders of the defined types were placed in this encounter.   All questions were answered. The patient knows to call the clinic with any problems, questions or concerns. No barriers to learning was detected. I spent 30 minutes counseling the patient face to face. The total time spent in the appointment was 40 minutes and more than 50% was on counseling and review of test results     [facility], [place], MD 12/10/2015 12:54 PM

## 2015-12-11 ENCOUNTER — Telehealth: Payer: Self-pay | Admitting: *Deleted

## 2015-12-11 ENCOUNTER — Encounter: Payer: Self-pay | Admitting: *Deleted

## 2015-12-11 ENCOUNTER — Other Ambulatory Visit: Payer: Self-pay | Admitting: Radiology

## 2015-12-11 ENCOUNTER — Other Ambulatory Visit: Payer: Self-pay | Admitting: *Deleted

## 2015-12-11 DIAGNOSIS — C099 Malignant neoplasm of tonsil, unspecified: Secondary | ICD-10-CM

## 2015-12-11 NOTE — Therapy (Signed)
[facility] [address] [place], [place], [address] Phone: [phone]   Fax:  [phone]  Speech Language Pathology Evaluation  Patient Details  Name: Gregory Jenkins MRN: [id number] Date of Birth: 1951-01-16 Referring Provider: Eppie Gibson, M.D.  Encounter Date: 12/10/2015      End of Session - 12/10/15 1000    Visit Number 1   Number of Visits 7   Date for SLP Re-Evaluation 06/08/16   SLP Start Time 0935   SLP Stop Time  1015   SLP Time Calculation (min) 40 min   Activity Tolerance Patient tolerated treatment well      Past Medical History  Diagnosis Date  . Allergic rhinitis   . Hypertension   . Thyroid disease   . Clotting disorder (HCC)     PE, suspect lupus anticoagulant  . History of pulmonary embolism   . History of nephrolithiasis     Past Surgical History  Procedure Laterality Date  . Eye surgery Right 1980's    H/O right orbital "blowout" fracture  . Foot surgery Left     [facility] Neuroma  . Knee surgery Right     Arthroscopic    Filed Vitals:   12/10/15 0751  BP: 103/63  Pulse: 64  Temp: 98.6 F (37 C)  TempSrc: Oral  Weight: 203 lb 3.2 oz (92.171 kg)  SpO2: 98%    Visit Diagnosis: Dysphagia      Subjective Assessment - 12/10/15 0936    Subjective Pt does not report any difficulty with meals   Patient is accompained by: Family member  wife   Currently in Pain? No/denies            SLP Evaluation [facility] - 12/10/15 [id number]    SLP Visit Information   SLP Received On 12/10/15   Referring Provider Eppie Gibson, M.D.   Medical Diagnosis Tonsilar Cancer   Pain Assessment   Pain Score 0-No pain   General Information   HPI Chemo beginning Wednesday 12-18-15 and radiation 12-17-15.   Prior Functional Status   Cognitive/Linguistic Baseline Within functional limits   Cognition   Overall Cognitive Status Within Functional Limits for tasks assessed   Auditory Comprehension   Overall Auditory Comprehension Appears within functional limits for tasks assessed   Verbal Expression   Overall Verbal Expression Appears within functional limits for tasks assessed   Oral Motor/Sensory Function   Overall Oral Motor/Sensory Function Appears within functional limits for tasks assessed   Labial ROM Within Functional Limits   Labial Strength Within Functional Limits   Lingual ROM Within Functional Limits   Lingual Strength Within Functional Limits   Velum Within Functional Limits   Motor Speech   Overall Motor Speech Appears within functional limits for tasks assessed      Pt currently tolerates regular diet and thin liquids and denies signs of difficulty during meals. Pt reports throat clearing throughout the day likely associated with allergies, worse at night. Oral motor assessment revealed as reported above. POs: Pt at sandwich and drank H2O without overt s/s aspiration. Thyroid elevation appeared WNL, and swallows appeared timely. Oral residue noted as WNL. Pt's swallow deemed WNL at this time.   Because data states the risk for dysphagia during and after radiation treatment is high due to undergoing radiation and and chemo tx, SLP taught pt about the possibility of reduced/limited ability for PO intake during chemo/rad tx. SLP encouraged pt to continue swallowing POs as far into chemo/rad tx as possible,  even ingesting POs and/or completing HEP shortly after administration of pain meds.   SLP educated pt re: changes to swallowing musculature after rad tx, and why adherence to dysphagia HEP provided today and PO consumption was necessary to inhibit muscular disuse atrophy and to reduce muscle fibrosis following rad tx. Pt demonstrated understanding of these things to SLP.    After eval tasks, SLP then developed a HEP for pt and educated pt on its procedure. Pt was instructed how to perform exercises involving lingual, vocal, and pharyngeal strengthening. SLP performed each  exercise and pt return demonstrated each exercise. SLP ensured pt performance was correct prior to moving on to next exercise. Pt was instructed to complete this program once per day until rad begins, then 2-3 times per day, 6-7 days/week until 60 days after their last rad tx, then x2-3 a week after that. Pt would benefit from skilled ST(see "clinicial impression statement" below).                     SLP Education - 12/10/15 1016    Education provided Yes   Education Details HEP, late effects head/neck radiation   Person(s) Educated Patient;Spouse   Methods Explanation;Demonstration;Verbal cues;Handout   Comprehension Verbalized understanding;Verbal cues required          SLP Short Term Goals - 12/10/15 1000    SLP SHORT TERM GOAL #1   Title pt will perform HEP with rare min A   Time 3   Period --  visits   Status New   SLP SHORT TERM GOAL #2   Title pt will tell SLP why he is completing HEP   Time 3   Period --  visits   Status New   SLP SHORT TERM GOAL #3   Title pt will tell SLP 3 s/s aspiration PNA   Time 3   Period --  visits          SLP Long Term Goals - 12/11/15 [id number]    SLP LONG TERM GOAL #1   Title pt will perform HEP with modified independence over two sessions   Time 4   Period --  visits   Status New   SLP LONG TERM GOAL #2   Title pt will tell SLP how a food journal can expediate return to regular diet following chemo/rad tx   Time 4   Period --  visits   Status New   SLP LONG TERM GOAL #3   Title pt will maintain performance of HEP independently over three sessions   Time 6   Period --  visits   Status New   SLP LONG TERM GOAL #4   Title pt will tell SLP date he modifies HEP frequency to 2-3x/week over two sessions   Time 7   Period --  visits   Status New          Plan - 12/10/15 0940    Clinical Impression Statement Pt presents with swallowing essentially WNL, however risk of dysphagia increases as pt undergoes  chemo/rad. Skilled ST necessary to cont to assess pt safety with POs as well as assess procedure with HEP.    Speech Therapy Frequency --  approx every four weeks   Duration --  6 visits   Treatment/Interventions Aspiration precaution training;Pharyngeal strengthening exercises;Compensatory techniques;Diet toleration management by SLP;Trials of upgraded texture/liquids;Patient/family education;Compensatory strategies;SLP instruction and feedback   Potential to Achieve Goals Good   SLP Home Exercise Plan provided today  Consulted and Agree with Plan of Care Patient        Problem List Patient Active Problem List   Diagnosis Date Noted  . Drug-induced hyperkalemia 12/10/2015  . Carcinoma of tonsillar fossa ([place]) 12/06/2015  . Tonsil cancer ([place]) 11/25/2015    [facility] ,MS, CCC-SLP  12/11/2015, 8:11 AM  [facility] [address] [place], [place], [address] Phone: [phone]   Fax:  [phone]  Name: Gregory Jenkins MRN: [id number] Date of Birth: January 03, 1951

## 2015-12-11 NOTE — Telephone Encounter (Signed)
  Oncology Nurse Navigator Documentation  Navigator Location: [facility] Onc (12/11/15 0941) Navigator Encounter Type: Telephone (12/11/15 0941) Telephone: Outgoing Call (12/11/15 0941)           Treatment Phase: Pre-Tx/Tx Discussion (12/11/15 0941) Barriers/Navigation Needs: Coordination of Care (12/11/15 0941)   Interventions: Coordination of Care (12/11/15 0941)       Spoke with Santiago Glad, [facility] liaison, to inquire of patient's qualification for [facility] nursing visit s/p his PEG placement tomorrow.  She indicated she will review his information, call me back.  Gayleen Orem, RN, BSN, [place] at [place] [phone]                   Time Spent with Patient: 15 (12/11/15 0941)

## 2015-12-11 NOTE — Telephone Encounter (Signed)
  Oncology Nurse Navigator Documentation   Received call from Ray, [facility].  She informed me patient qualifies for [facility] to receive nursing instruction for PEG care and use.  Following our conversation, I placed referral for [facility] per Karen's guidance, called patient to inform.  Gayleen Orem, RN, BSN, [place] at [place] [phone]

## 2015-12-11 NOTE — Progress Notes (Signed)
  Oncology Nurse Navigator Documentation  Navigator Location: [facility] Onc (12/11/15 1600) Navigator Encounter Type: Telephone (12/11/15 1600)       Confirmed with patient his 12:30 arrival tomorrow for PEG and PAC placement, NPO status after 8:00 AM.  He understands he is to begin drinking barium contrast this evening over ca 4 hr period.  Gayleen Orem, RN, BSN, [place] at [place] [phone]                                       Time Spent with Patient: 15 (12/11/15 1600)

## 2015-12-12 ENCOUNTER — Encounter: Payer: Self-pay | Admitting: Hematology and Oncology

## 2015-12-12 ENCOUNTER — Ambulatory Visit (HOSPITAL_COMMUNITY)
Admission: RE | Admit: 2015-12-12 | Discharge: 2015-12-12 | Disposition: A | Discharge status: Home / Self Care | Payer: 59 | Source: Ambulatory Visit | Attending: Hematology and Oncology | Admitting: Hematology and Oncology

## 2015-12-12 ENCOUNTER — Other Ambulatory Visit: Payer: Self-pay | Admitting: Hematology and Oncology

## 2015-12-12 ENCOUNTER — Encounter: Payer: Self-pay | Admitting: *Deleted

## 2015-12-12 ENCOUNTER — Encounter (HOSPITAL_COMMUNITY): Payer: Self-pay

## 2015-12-12 DIAGNOSIS — I10 Essential (primary) hypertension: Secondary | ICD-10-CM | POA: Diagnosis not present

## 2015-12-12 DIAGNOSIS — Z86711 Personal history of pulmonary embolism: Secondary | ICD-10-CM | POA: Insufficient documentation

## 2015-12-12 DIAGNOSIS — E079 Disorder of thyroid, unspecified: Secondary | ICD-10-CM | POA: Insufficient documentation

## 2015-12-12 DIAGNOSIS — C099 Malignant neoplasm of tonsil, unspecified: Secondary | ICD-10-CM | POA: Diagnosis not present

## 2015-12-12 DIAGNOSIS — R221 Localized swelling, mass and lump, neck: Secondary | ICD-10-CM | POA: Insufficient documentation

## 2015-12-12 DIAGNOSIS — Z510 Encounter for antineoplastic radiation therapy: Secondary | ICD-10-CM | POA: Diagnosis not present

## 2015-12-12 DIAGNOSIS — Z7982 Long term (current) use of aspirin: Secondary | ICD-10-CM | POA: Insufficient documentation

## 2015-12-12 LAB — BASIC METABOLIC PANEL
ANION GAP: 9 (ref 5–15)
BUN: 17 mg/dL (ref 6–20)
CALCIUM: 9 mg/dL (ref 8.9–10.3)
CO2: 24 mmol/L (ref 22–32)
Chloride: 103 mmol/L (ref 101–111)
Creatinine, Ser: 1.11 mg/dL (ref 0.61–1.24)
GFR calc Af Amer: >60 mL/min (ref >60)
GLUCOSE: 91 mg/dL (ref 65–99)
POTASSIUM: 3.9 mmol/L (ref 3.5–5.1)
SODIUM: 136 mmol/L (ref 135–145)

## 2015-12-12 LAB — CBC WITH DIFFERENTIAL/PLATELET
BASOS ABS: 0 10*3/uL (ref 0.0–0.1)
BASOS PCT: 0 %
EOS PCT: 1 %
Eosinophils Absolute: 0.1 10*3/uL (ref 0.0–0.7)
HEMATOCRIT: 43.5 % (ref 39.0–52.0)
Hemoglobin: 14.4 g/dL (ref 13.0–17.0)
Lymphocytes Relative: 23 %
Lymphs Abs: 1.6 10*3/uL (ref 0.7–4.0)
MCH: 31.1 pg (ref 26.0–34.0)
MCHC: 33.1 g/dL (ref 30.0–36.0)
MCV: 94 fL (ref 78.0–100.0)
MONO ABS: 0.5 10*3/uL (ref 0.1–1.0)
Monocytes Relative: 7 %
NEUTROS ABS: 4.7 10*3/uL (ref 1.7–7.7)
Neutrophils Relative %: 69 %
PLATELETS: 213 10*3/uL (ref 150–400)
RBC: 4.63 MIL/uL (ref 4.22–5.81)
RDW: 12.9 % (ref 11.5–15.5)
WBC: 6.8 10*3/uL (ref 4.0–10.5)

## 2015-12-12 LAB — PROTIME-INR
INR: 0.99 (ref 0.00–1.49)
Prothrombin Time: 13.3 seconds (ref 11.6–15.2)

## 2015-12-12 MED ORDER — GLUCAGON HCL RDNA (DIAGNOSTIC) 1 MG IJ SOLR
INTRAMUSCULAR | Status: AC | PRN
  Administered 2015-12-12: 1 mg via INTRAVENOUS

## 2015-12-12 MED ORDER — SODIUM CHLORIDE 0.9 % IV SOLN
INTRAVENOUS | Status: DC
  Administered 2015-12-12: 13:00:00 via INTRAVENOUS

## 2015-12-12 MED ORDER — GLUCAGON HCL RDNA (DIAGNOSTIC) 1 MG IJ SOLR
INTRAMUSCULAR | Status: AC
  Filled 2015-12-12: qty 1

## 2015-12-12 MED ORDER — CEFAZOLIN SODIUM-DEXTROSE 2-3 GM-% IV SOLR
2.0000 g | INTRAVENOUS | Status: AC
  Administered 2015-12-12: 2 g via INTRAVENOUS

## 2015-12-12 MED ORDER — FENTANYL CITRATE (PF) 100 MCG/2ML IJ SOLN
INTRAMUSCULAR | Status: AC | PRN
  Administered 2015-12-12 (×2): 25 ug via INTRAVENOUS
  Administered 2015-12-12: 50 ug via INTRAVENOUS
  Administered 2015-12-12 (×3): 25 ug via INTRAVENOUS

## 2015-12-12 MED ORDER — MIDAZOLAM HCL 2 MG/2ML IJ SOLN
INTRAMUSCULAR | Status: AC | PRN
  Administered 2015-12-12 (×3): 0.5 mg via INTRAVENOUS
  Administered 2015-12-12: 1 mg via INTRAVENOUS
  Administered 2015-12-12 (×2): 0.5 mg via INTRAVENOUS
  Administered 2015-12-12 (×2): 1 mg via INTRAVENOUS
  Administered 2015-12-12: 0.5 mg via INTRAVENOUS

## 2015-12-12 MED ORDER — FENTANYL CITRATE (PF) 100 MCG/2ML IJ SOLN
INTRAMUSCULAR | Status: AC
  Filled 2015-12-12: qty 4

## 2015-12-12 MED ORDER — HEPARIN SOD (PORK) LOCK FLUSH 100 UNIT/ML IV SOLN
INTRAVENOUS | Status: AC
  Filled 2015-12-12: qty 5

## 2015-12-12 MED ORDER — HYDROCODONE-ACETAMINOPHEN 5-325 MG PO TABS: 1.0000 | ORAL_TABLET | ORAL | Status: DC | PRN

## 2015-12-12 MED ORDER — HEPARIN SOD (PORK) LOCK FLUSH 100 UNIT/ML IV SOLN
INTRAVENOUS | Status: AC | PRN
  Administered 2015-12-12: 500 [IU]

## 2015-12-12 MED ORDER — CEFAZOLIN SODIUM-DEXTROSE 2-3 GM-% IV SOLR
INTRAVENOUS | Status: AC
  Filled 2015-12-12: qty 50

## 2015-12-12 MED ORDER — IOHEXOL 300 MG/ML  SOLN
15.0000 mL | Freq: Once | INTRAMUSCULAR | Status: AC | PRN
  Administered 2015-12-12: 15 mL

## 2015-12-12 MED ORDER — LIDOCAINE-EPINEPHRINE 2 %-1:100000 IJ SOLN
INTRAMUSCULAR | Status: AC
  Filled 2015-12-12: qty 2

## 2015-12-12 MED ORDER — MIDAZOLAM HCL 2 MG/2ML IJ SOLN
INTRAMUSCULAR | Status: AC
  Filled 2015-12-12: qty 6

## 2015-12-12 MED FILL — OXYCODONE/APAP 5/325MG: 5-325 | 2 days supply | Qty: 20 | Fill #0

## 2015-12-12 NOTE — Progress Notes (Signed)
Patient ID: Gregory Jenkins, male   DOB: [birth year], 65 y.o.   MRN: [id number] Patient given prescription for Percocet 5/325, #20, no refills, 1-2 tablets every 4-6 hours as needed for moderate pain post gastrostomy tube placement.

## 2015-12-12 NOTE — Sedation Documentation (Signed)
Patient denies pain and is resting comfortably.  

## 2015-12-12 NOTE — Progress Notes (Signed)
Liliane Channel, RN navigator, is up to see family to instruct in G tube flushing and answering questions about navigating all the future appointments.

## 2015-12-12 NOTE — H&P (Signed)
Chief Complaint: Patient was seen in consultation today for Port-A-Cath and percutaneous gastrostomy tube placements  Referring Physician(s): Gorsuch,Ni  History of Present Illness: Gregory Jenkins is a 65 y.o. 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 Past medical history also significant for left pulmonary embolus 2010 .  Past Medical History  Diagnosis Date  . Allergic rhinitis   . Hypertension   . Thyroid disease   . Clotting disorder (HCC)     PE, suspect lupus anticoagulant  . History of pulmonary embolism   . History of nephrolithiasis     Past Surgical History  Procedure Laterality Date  . Eye surgery Right 1980's    H/O right orbital "blowout" fracture  . Foot surgery Left     [facility] Neuroma  . Knee surgery Right     Arthroscopic    Allergies: Review of patient's allergies indicates no known allergies.  Medications: Prior to Admission medications   Medication Sig Start Date End Date Taking? Authorizing Provider  aspirin EC 81 MG tablet Take 81 mg by mouth daily.   Yes Historical Provider, MD  ibuprofen (ADVIL,MOTRIN) 200 MG tablet Take 200 mg by mouth as needed.   Yes Historical Provider, MD  levothyroxine (SYNTHROID, LEVOTHROID) 75 MCG tablet Take 75 mcg by mouth daily before breakfast.   Yes Historical Provider, MD  sodium fluoride (FLUORISHIELD) 1.1 % GEL dental gel Instill one drop of gel per tooth space of fluoride tray. Place over teeth for 5 minutes. Remove. Spit out excess. Repeat nightly. 12/02/15  Yes Lenn Cal, DDS  lidocaine-prilocaine (EMLA) cream Apply to affected area once 12/10/15   Heath Lark, MD  losartan (COZAAR) 25 MG tablet Take 25 mg by mouth every morning. 11/05/15   Historical Provider, MD  ondansetron (ZOFRAN) 8 MG tablet Take 1 tablet (8 mg total) by mouth  every 8 (eight) hours as needed for nausea. 12/10/15   Heath Lark, MD  promethazine (PHENERGAN) 25 MG tablet Take 1 tablet (25 mg total) by mouth every 6 (six) hours as needed for nausea. 12/10/15   Heath Lark, MD     Family History  Problem Relation Age of Onset  . Cancer Father     esophageal ca  . Cancer Maternal Aunt     breast ca  . Cancer Maternal Grandmother     brain ca  . Cancer Maternal Grandfather     lung ca  . COPD Mother     Social History   Social History  . Marital Status: Married    Spouse Name: N/A  . Number of Children: 3  . Years of Education: N/A   Social History Main Topics  . Smoking status: Never Smoker   . Smokeless tobacco: Former Systems developer    Types: Chew    Quit date: 11/20/2006  . Alcohol Use: 0.0 oz/week    0 Standard drinks or equivalent per week     Comment: He reports he is a social drinker  . Drug Use: No  . Sexual Activity: Not Asked   Other Topics Concern  . None   Social History Narrative      Review of Systems  Constitutional: Negative for fever and chills.  Respiratory: Negative for shortness of breath.        Occ cough  Cardiovascular: Negative for chest pain.  Gastrointestinal: Negative for nausea,  vomiting, abdominal pain and blood in stool.  Genitourinary: Negative for dysuria and hematuria.  Musculoskeletal: Negative for back pain.  Neurological: Negative for headaches.  Psychiatric/Behavioral: The patient is nervous/anxious.     Vital Signs: Blood pressure 154/78, temperature 97.8, heart rate 63, respirations 16, O2 sats 100% room air   Physical Exam  Constitutional: He appears well-developed and well-nourished.  Cardiovascular: Normal rate and regular rhythm.   Pulmonary/Chest: Effort normal and breath sounds normal.  Abdominal: Soft. Bowel sounds are normal.  Musculoskeletal: Normal range of motion. He exhibits no edema.  Lymphadenopathy:    He has cervical adenopathy.    Mallampati Score:     Imaging: Ct  Soft Tissue Neck W Contrast  11/18/2015  CLINICAL DATA:  Left-sided neck mass present for a few months. Aspirated 1 week ago. Creatinine was obtained on site at [facility] at [address] Results: Creatinine 1.1 mg/dL. EXAM: CT NECK WITH CONTRAST TECHNIQUE: Multidetector CT imaging of the neck was performed using the standard protocol following the bolus administration of intravenous contrast. CONTRAST:  62mL ISOVUE-300 IOPAMIDOL (ISOVUE-300) INJECTION 61% COMPARISON:  None. FINDINGS: Pharynx and larynx: There is mild asymmetric enlargement of the left superior palatine tonsil without a discrete hyper enhancing mass identified. The larynx is unremarkable. Salivary glands: The submandibular and parotid glands are unremarkable. Thyroid: Unremarkable. Lymph nodes: There are multiple enlarged lymph nodes in the left neck. The largest measures 2.2 cm in short axis in level III (series 2, image 44). A left level IB/II lymph node measures 1.2 cm in short axis. Additional left level II and III lymph nodes measure up to 1.3 cm in short axis. Nonenlarged left level IV lymph nodes measure up to 5 mm in short axis but are increased in number compared to the contralateral side. An enlarged right level III lymph node measures 1.1 cm in short axis (series 2, image 48). Some of the larger lymph nodes demonstrate heterogeneous enhancement which may reflect small areas of necrosis or cystic change. Vascular: Major vascular structures of the neck appear patent. The left internal jugular vein is compressed by the largest left level III lymph node. Limited intracranial: The visualized portion of the brain is unremarkable. Visualized orbits: Unremarkable. Mastoids and visualized paranasal sinuses: Right maxillary sinus mucous retention cyst. Clear mastoid air cells. Skeleton: Mild cervical spondylosis. No suspicious lytic or blastic osseous lesions identified. Upper chest: Unremarkable. IMPRESSION: 1. Left greater than  right cervical lymphadenopathy suspicious for nodal metastatic disease. 2. Mild asymmetry of the left tonsil -- correlate with direct visualization to assess for primary neoplasm. Electronically Signed   By: Logan Bores M.D.   On: 11/18/2015 17:58   Nm Pet Image Initial (pi) Skull Base To Thigh  11/28/2015  CLINICAL DATA:  Initial treatment strategy for left-sided tonsillar fossae carcinoma, status post biopsy 2 weeks ago. EXAM: NUCLEAR MEDICINE PET SKULL BASE TO THIGH TECHNIQUE: 12.6 mCi F-18 FDG was injected intravenously. Full-ring PET imaging was performed from the skull base to thigh after the radiotracer. CT data was obtained and used for attenuation correction and anatomic localization. FASTING BLOOD GLUCOSE:  Value: 91 mg/dl COMPARISON:  Neck CT of 11/18/2015. CT chest including 10/10/2010. Abdominal pelvic CT of 09/18/2009. FINDINGS: NECK Left palatine tonsil primary measures a S.U.V. max of 12.8, including on image 38/series 3. High left level 2 node measures 1.3 cm and a S.U.V. max of 15.2 on image 39/series 3. Left Level 2-3 node measures 2.2 cm and a S.U.V. max of 12.5  on image 54/series 3. Right level 2-3 node measures 1.2 cm and a S.U.V. max of 10.0 on image 59/ series 3. CHEST No areas of abnormal hypermetabolism. ABDOMEN/PELVIS No areas of abnormal hypermetabolism. SKELETON No abnormal marrow activity. CT IMAGES PERFORMED FOR ATTENUATION CORRECTION Right maxillary sinus mucous retention cyst or polyp. Borderline cardiomegaly. Punctate left renal collecting system calculus. Multiple gallstones. 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 This measures 2.7 cm on image 171/ series 3 was present 2010. Mild prostatomegaly. Degenerative partial fusion of the bilateral sacroiliac joints. Bilateral L5 pars defects. IMPRESSION: 1. Left palatine tonsil primary with left worse than right cervical nodal metastasis. 2. No  extracervical hypermetabolic metastasis. 3. Cholelithiasis and left nephrolithiasis. 4. 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 An atypical prominence of the gallbladder neck is felt less likely. Electronically Signed   By: Abigail Miyamoto M.D.   On: 11/28/2015 09:47    Labs:  CBC:  Recent Labs  12/10/15 1043 12/12/15 1317  WBC 5.7 6.8  HGB 14.6 14.4  HCT 44.0 43.5  PLT 203 213    COAGS: No results for input(s): INR, APTT in the last 8760 hours.  BMP:  Recent Labs  12/04/15 0937 12/10/15 1043  NA  --  138  K  --  5.4 No visable hemolysis*  CO2  --  28  GLUCOSE  --  104  BUN 17.8 17.1  CALCIUM  --  10.1  CREATININE 1.4* 1.2    LIVER FUNCTION TESTS:  Recent Labs  12/10/15 1043  BILITOT 0.73  AST 19  ALT 21  ALKPHOS 81  PROT 7.4  ALBUMIN 3.6    TUMOR MARKERS: No results for input(s): AFPTM, CEA, CA199, CHROMGRNA in the last 8760 hours.  Assessment and Plan: 65 y.o. 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 Details/risks of procedures, including but not limited to, internal bleeding, infection, venous thrombosis, injury to adjacent organs discussed with patient and family with their understanding and consent.   Thank you for this interesting consult.  I greatly enjoyed meeting Gregory Jenkins and look forward to participating in their care.  A copy of this report was sent to the requesting provider on this date.  Electronically Signed: D. Rowe Robert 12/12/2015, 1:48 PM   I spent a total of 15 minutes in face to face in clinical consultation, greater than 50% of which was counseling/coordinating care for Port-A-Cath and percutaneous gastrostomy tube placements

## 2015-12-12 NOTE — Discharge Instructions (Signed)
Gastrostomy Tube Home Guide, Adult A gastrostomy tube is a tube that is surgically placed into the stomach. It is also called a "G-tube." G-tubes are used when a person is unable to eat and drink enough on their own to stay healthy. The tube is inserted into the stomach through a small cut (incision) in the skin. This tube is used for:  Feeding.  Giving medication. GASTROSTOMY TUBE CARE  Wash your hands with soap and water.  Remove the old dressing (if any). Some styles of G-tubes may need a dressing inserted between the skin and the G-tube. Other types of G-tubes do not require a dressing. Ask your health care provider if a dressing is needed.  Check the area where the tube enters the skin (insertion site) for redness, swelling, or pus-like (purulent) drainage. A small amount of clear or tan liquid drainage is normal. Check to make sure scar tissue (skin) is not growing around the insertion site. This could have a raised, bumpy appearance.  A cotton swab can be used to clean the skin around the tube:  When the G-tube is first put in, a normal saline solution or water can be used to clean the skin.  Mild soap and warm water can be used when the skin around the G-tube site has healed.  Roll the cotton swab around the G-tube insertion site to remove any drainage or crusting at the insertion site. STOMACH RESIDUALS Feeding tube residuals are the amount of liquids that are in the stomach at any given time. Residuals may be checked before giving feedings, medications, or as instructed by your health care provider.  Ask your health care provider if there are instances when you would not start tube feedings depending on the amount or type of contents withdrawn from the stomach.  Check residuals by attaching a syringe to the G-tube and pulling back on the syringe plunger. Note the amount, and return the residual back into the stomach. FLUSHING THE G-TUBE  The G-tube should be periodically  flushed with clean warm water to keep it from clogging.  Flush the G-tube after feedings or medications. Draw up 30 mL of warm water in a syringe. Connect the syringe to the G-tube and slowly push the water into the tube.  Do not push feedings, medications, or flushes rapidly. Flush the G-tube gently and slowly.  Only use syringes made for G-tubes to flush medications or feedings.  Your health care provider may want the G-tube flushed more often or with more water. If this is the case, follow your health care provider's instructions. FEEDINGS Your health care provider will determine whether feedings are given as a bolus (a certain amount given at one time and at scheduled times) or whether feedings will be given continuously on a feeding pump.   Formulas should be given at room temperature.  If feedings are continuous, no more than 4 hours worth of feedings should be placed in the feeding bag. This helps prevent spoilage or accidental excess infusion.  Cover and place unused formula in the refrigerator.  If feedings are continuous, stop the feedings when medications or flushes are given. Be sure to restart the feedings.  Feeding bags and syringes should be replaced as instructed by your health care provider. GIVING MEDICATION   In general, it is best if all medications are in a liquid form for G-tube administration. Liquid medications are less likely to clog the G-tube.  Mix the liquid medication with 30 mL (or amount recommended by  your health care provider) of warm water. °¨ Draw up the medication into the syringe. °¨ Attach the syringe to the G-tube and slowly push the mixture into the G-tube. °¨ After giving the medication, draw up 30 mL of warm water in the syringe and slowly flush the G-tube. °· For pills or capsules, check with your health care provider first before crushing medications. Some pills are not effective if they are crushed. Some capsules are sustained-release  medications. °¨ If appropriate, crush the pill or capsule and mix with 30 mL of warm water. Using the syringe, slowly push the medication through the tube, then flush the tube with another 30 mL of tap water. °G-TUBE PROBLEMS °G-tube was pulled out. °· Cause: May have been pulled out accidentally. °· Solutions: Cover the opening with clean dressing and tape. Call your health care provider right away. The G-tube should be put in as soon as possible (within 4 hours) so the G-tube opening (tract) does not close. The G-tube needs to be put in at a health care setting. An X-ray needs to be done to confirm placement before the G-tube can be used again. °Redness, irritation, soreness, or foul odor around the gastrostomy site. °· Cause: May be caused by leakage or infection. °· Solutions: Call your health care provider right away. °Large amount of leakage of fluid or mucus-like liquid present (a large amount means it soaks clothing). °· Cause: Many reasons could cause the G-tube to leak. °· Solutions: Call your health care provider to discuss the amount of leakage. °Skin or scar tissue appears to be growing where tube enters skin.  °· Cause: Tissue growth may develop around the insertion site if the G-tube is moved or pulled on excessively. °· Solutions: Secure tube with tape so that excess movement does not occur. Call your health care provider. °G-tube is clogged. °· Cause: Thick formula or medication. °· Solutions: Try to slowly push warm water into the tube with a large syringe. Never try to push any object into the tube to unclog it. Do not force fluid into the G-tube. If you are unable to unclog the tube, call your health care provider right away. °TIPS °· Head of bed (HOB) position refers to the upright position of a person's upper body. °¨ When giving medications or a feeding bolus, keep the HOB up as told by your health care provider. Do this during the feeding and for 1 hour after the feeding or medication  administration. °¨ If continuous feedings are being given, it is best to keep the HOB up as told by your health care provider. When ADLs (activities of daily living) are performed and the HOB needs to be flat, be sure to turn the feeding pump off. Restart the feeding pump when the HOB is returned to the recommended height. °· Do not pull or put tension on the tube. °· To prevent fluid backflow, kink the G-tube before removing the cap or disconnecting a syringe. °· Check the G-tube length every day. Measure from the insertion site to the end of the G-tube. If the length is longer than previous measurements, the tube may be coming out. Call your health care provider if you notice increasing G-tube length. °· Oral care, such as brushing teeth, must be continued. °· You may need to remove excess air (vent) from the G-tube. Your health care provider will tell you if this is needed. °· Always call your health care provider if you have questions or problems with the   G-tube. SEEK IMMEDIATE MEDICAL CARE IF:   You have severe abdominal pain, tenderness, or abdominal bloating (distension).  You have nausea or vomiting.  You are constipated or have problems moving your bowels.  The G-tube insertion site is red, swollen, has a foul smell, or has yellow or brown drainage.  You have difficulty breathing or shortness of breath.  You have a fever.  You have a large amount of feeding tube residuals.  The G-tube is clogged and cannot be flushed. MAKE SURE YOU:   Understand these instructions.  Will watch your condition.  Will get help right away if you are not doing well or get worse.   This information is not intended to replace advice given to you by your health care provider. Make sure you discuss any questions you have with your health care provider.   Document Released: 12/21/2001 Document Revised: 02/26/2015 Document Reviewed: 06/19/2013 Elsevier Interactive Patient Education 2016 [place] of a Feeding Tube People who have trouble swallowing or cannot take food or medicine by mouth are sometimes given feeding tubes. A feeding tube can go into the nose and down to the stomach or through the skin in the abdomen and into the stomach or small bowel. Some of the names of these feeding tubes are gastrostomy tubes, PEG lines, nasogastric tubes, and gastrojejunostomy tubes.  SUPPLIES NEEDED TO CARE FOR THE TUBE SITE  Clean gloves.  Clean wash cloth, gauze pads, or soft paper towel.  Cotton swabs.  Skin barrier ointment or cream.  Soap and water.  Pre-cut foam pads or gauze (that go around the tube).  Tube tape. TUBE SITE CARE  Have all supplies ready and available.  Wash hands well.  Put on clean gloves.  Remove the soiled foam pad or gauze, if present, that is found under the tube stabilizer. Change the foam pad or gauze daily or when soiled or moist.  Check the skin around the tube site for redness, rash, swelling, drainage, or extra tissue growth. If you notice any of these, call your caregiver.  Moisten gauze and cotton swabs with water and soap.  Wipe the area closest to the tube (right near the stoma) with cotton swabs. Wipe the surrounding skin with moistened gauze. Rinse with water.  Dry the skin and stoma site with a dry gauze pad or soft paper towel. Do not use antibiotic ointments at the tube site.  If the skin is red, apply a skin barrier cream or ointment (such as petroleum jelly) in a circular motion, using a cotton swab. The cream or ointment will provide a moisture barrier for the skin and helps with wound healing.  Apply a new pre-cut foam pad or gauze around the tube. Secure it with tape around the edges. If no drainage is present, foam pads or gauze may be left off.  Use tape or an anchoring device to fasten the feeding tube to the skin for comfort or as directed. Rotate where you tape the tube to avoid skin damage from the  adhesive.  Position the person in a semi-upright position (30-45 degree angle).  Throw away used supplies.  Remove gloves.  Wash hands. SUPPLIES NEEDED TO FLUSH A FEEDING TUBE  Clean gloves.  60 mL syringe (that connects to the feeding tube).  Towel.  Water. FLUSHING A FEEDING TUBE   Have all supplies ready and available.  Wash hands well.  Put on clean gloves.  Draw up 30 mL of water in the syringe.  Kink the feeding tube while disconnecting it from the feeding-bag tubing or while removing the plug at the end of the tube. Kinking closes the tube and prevents secretions in the tube from spilling out.  Insert the tip of the syringe into the end of the feeding tube. Release the kink. Slowly inject the water.  If unable to inject the water, the person with the feeding tube should lay on his or her left side. The tip of the tube may be against the stomach wall, blocking fluid flow. Changing positions may move the tip away from the stomach wall. After repositioning, try injecting the water again.  After injecting the water, remove the syringe.  Always flush before giving the first medicine, between medicines, and after the final medicine before starting a feeding. This prevents medicines from clogging the tube.  Throw away used supplies.  Remove gloves.  Wash hands.   This information is not intended to replace advice given to you by your health care provider. Make sure you discuss any questions you have with your health care provider.   Document Released: 10/12/2005 Document Revised: 09/28/2012 Document Reviewed: 05/26/2012 Elsevier Interactive Patient Education 2016 [employer]   Moderate Conscious Sedation, Adult, Care After Refer to this sheet in the next few weeks. These instructions provide you with information on caring for yourself after your procedure. Your health care provider may also give you more specific instructions. Your treatment has been planned  according to current medical practices, but problems sometimes occur. Call your health care provider if you have any problems or questions after your procedure. WHAT TO EXPECT AFTER THE PROCEDURE  After your procedure:  You may feel sleepy, clumsy, and have poor balance for several hours.  Vomiting may occur if you eat too soon after the procedure. HOME CARE INSTRUCTIONS  Do not participate in any activities where you could become injured for at least 24 hours. Do not:  Drive.  Swim.  Ride a bicycle.  Operate heavy machinery.  Cook.  Use power tools.  Climb ladders.  Work from a high place.  Do not make important decisions or sign legal documents until you are improved.  If you vomit, drink water, juice, or soup when you can drink without vomiting. Make sure you have little or no nausea before eating solid foods.  Only take over-the-counter or prescription medicines for pain, discomfort, or fever as directed by your health care provider.  Make sure you and your family fully understand everything about the medicines given to you, including what side effects may occur.  You should not drink alcohol, take sleeping pills, or take medicines that cause drowsiness for at least 24 hours.  If you smoke, do not smoke without supervision.  If you are feeling better, you may resume normal activities 24 hours after you were sedated.  Keep all appointments with your health care provider. SEEK MEDICAL CARE IF:  Your skin is pale or bluish in color.  You continue to feel nauseous or vomit.  Your pain is getting worse and is not helped by medicine.  You have bleeding or swelling.  You are still sleepy or feeling clumsy after 24 hours. SEEK IMMEDIATE MEDICAL CARE IF:  You develop a rash.  You have difficulty breathing.  You develop any type of allergic problem.  You have a fever. MAKE SURE YOU:  Understand these instructions.  Will watch your condition.  Will get  help right away if you are not doing well or get  worse.   This information is not intended to replace advice given to you by your health care provider. Make sure you discuss any questions you have with your health care provider.   Document Released: 08/02/2013 Document Revised: 11/02/2014 Document Reviewed: 08/02/2013 Elsevier Interactive Patient Education 2016 [place] Insertion, Care After Refer to this sheet in the next few weeks. These instructions provide you with information on caring for yourself after your procedure. Your health care provider may also give you more specific instructions. Your treatment has been planned according to current medical practices, but problems sometimes occur. Call your health care provider if you have any problems or questions after your procedure. WHAT TO EXPECT AFTER THE PROCEDURE After your procedure, it is typical to have the following:   Discomfort at the port insertion site. Ice packs to the area will help.  Bruising on the skin over the port. This will subside in 3-4 days. HOME CARE INSTRUCTIONS  After your port is placed, you will get a manufacturer's information card. The card has information about your port. Keep this card with you at all times.   Know what kind of port you have. There are many types of ports available.   Wear a medical alert bracelet in case of an emergency. This can help alert health care workers that you have a port.   The port can stay in for as long as your health care provider believes it is necessary.   A home health care nurse may give medicines and take care of the port.   You or a family member can get special training and directions for giving medicine and taking care of the port at home.  SEEK MEDICAL CARE IF:   Your port does not flush or you are unable to get a blood return.   You have a fever or chills. SEEK IMMEDIATE MEDICAL CARE IF:  You have new fluid or pus coming from  your incision.   You notice a bad smell coming from your incision site.   You have swelling, pain, or more redness at the incision or port site.   You have chest pain or shortness of breath.   This information is not intended to replace advice given to you by your health care provider. Make sure you discuss any questions you have with your health care provider.   Document Released: 08/02/2013 Document Revised: 10/17/2013 Document Reviewed: 08/02/2013 Elsevier Interactive Patient Education 2016 [place] An implanted port is a type of central line that is placed under the skin. Central lines are used to provide IV access when treatment or nutrition needs to be given through a person's veins. Implanted ports are used for long-term IV access. An implanted port may be placed because:   You need IV medicine that would be irritating to the small veins in your hands or arms.   You need long-term IV medicines, such as antibiotics.   You need IV nutrition for a long period.   You need frequent blood draws for lab tests.   You need dialysis.  Implanted ports are usually placed in the chest area, but they can also be placed in the upper arm, the abdomen, or the leg. An implanted port has two main parts:   Reservoir. The reservoir is round and will appear as a small, raised area under your skin. The reservoir is the part where a needle is inserted to give medicines or draw blood.  Catheter. The catheter is a thin, flexible tube that extends from the reservoir. The catheter is placed into a large vein. Medicine that is inserted into the reservoir goes into the catheter and then into the vein.  HOW WILL I CARE FOR MY INCISION SITE? Do not get the incision site wet. Bathe or shower as directed by your health care provider.  HOW IS MY PORT ACCESSED? Special steps must be taken to access the port:   Before the port is accessed, a numbing cream can be placed  on the skin. This helps numb the skin over the port site.   Your health care provider uses a sterile technique to access the port.  Your health care provider must put on a mask and sterile gloves.  The skin over your port is cleaned carefully with an antiseptic and allowed to dry.  The port is gently pinched between sterile gloves, and a needle is inserted into the port.  Only "non-coring" port needles should be used to access the port. Once the port is accessed, a blood return should be checked. This helps ensure that the port is in the vein and is not clogged.   If your port needs to remain accessed for a constant infusion, a clear (transparent) bandage will be placed over the needle site. The bandage and needle will need to be changed every week, or as directed by your health care provider.   Keep the bandage covering the needle clean and dry. Do not get it wet. Follow your health care provider's instructions on how to take a shower or bath while the port is accessed.   If your port does not need to stay accessed, no bandage is needed over the port.  WHAT IS FLUSHING? Flushing helps keep the port from getting clogged. Follow your health care provider's instructions on how and when to flush the port. Ports are usually flushed with saline solution or a medicine called heparin. The need for flushing will depend on how the port is used.   If the port is used for intermittent medicines or blood draws, the port will need to be flushed:   After medicines have been given.   After blood has been drawn.   As part of routine maintenance.   If a constant infusion is running, the port may not need to be flushed.  HOW LONG WILL MY PORT STAY IMPLANTED? The port can stay in for as long as your health care provider thinks it is needed. When it is time for the port to come out, surgery will be done to remove it. The procedure is similar to the one performed when the port was put in.  WHEN  SHOULD I SEEK IMMEDIATE MEDICAL CARE? When you have an implanted port, you should seek immediate medical care if:   You notice a bad smell coming from the incision site.   You have swelling, redness, or drainage at the incision site.   You have more swelling or pain at the port site or the surrounding area.   You have a fever that is not controlled with medicine.   This information is not intended to replace advice given to you by your health care provider. Make sure you discuss any questions you have with your health care provider.   Document Released: 10/12/2005 Document Revised: 08/02/2013 Document Reviewed: 06/19/2013 Elsevier Interactive Patient Education [employer].

## 2015-12-12 NOTE — Progress Notes (Signed)
Spoke w/ pt's wife regarding copay assistance for Erbitux.  After reviewing the application, they exceed the income qualifications for approval but she believes they have met their deductible for the year so ins should pay at 100%.  I also went over the [place] but they exceed the income requirement for that as well.  He has my card for any questions or concerns they may have in the future.

## 2015-12-12 NOTE — Procedures (Signed)
Successful placement of right IJ approach port-a-cath with tip at the superior caval atrial junction. The catheter is ready for immediate use.  Successful fluoroscopic guided insertion of gastrostomy tube.   The gastrostomy tube may be used immediately for medications.   Tube feeds may be initiated in 24 hours as per the primary team.    EBL: Minimal  No immediate post procedural complications.   Ronny Bacon, MD Pager #: [phone]

## 2015-12-13 ENCOUNTER — Telehealth: Payer: Self-pay | Admitting: *Deleted

## 2015-12-13 ENCOUNTER — Telehealth: Payer: Self-pay | Admitting: Hematology and Oncology

## 2015-12-13 DIAGNOSIS — Z510 Encounter for antineoplastic radiation therapy: Secondary | ICD-10-CM | POA: Diagnosis not present

## 2015-12-13 NOTE — Telephone Encounter (Signed)
Mailed pt updated schedule due to adding labs to infusion time.... Gregory Jenkins

## 2015-12-13 NOTE — Telephone Encounter (Signed)
  Oncology Nurse Navigator Documentation  Navigator Location: [facility] (12/13/15 1205) Navigator Encounter Type: Telephone (12/13/15 1205) Telephone: Outgoing Call;Symptom Mgt (12/13/15 1205)         Patient Visit Type: Follow-up (12/13/15 1205) Treatment Phase: Pre-Tx/Tx Discussion (12/13/15 1205) Barriers/Navigation Needs: No barriers at this time (12/13/15 1205)   Interventions: None required (12/13/15 1205)            Acuity: Level 2 (12/13/15 1205)   Acuity Level 2: Educational needs;Ongoing guidance and education throughout treatment as needed (12/13/15 1205)     Called Gregory Jenkins to check on Gregory well-being s/p yesterday's PAC and PEG placements.  I also spoke with Gregory Jenkins. He reported:  Took 1 tablet Percocet twice last evening for pain, slept through night comfortably.  Is about to take first dose today since waking up this morning.  Ambulating around house and outside in driveway today, experiencing expected discomfort.  Tolerating diet - Drank clear liquids last HS, ate eggs w/ cheese this morning, drank an Ensure, having soup and crackers for lunch.  [facility] RN came by this morning, provided PAC and PEG care instructions.    He plans to shower this evening, conduct initial PEG flush at that time using bottled water (they have well water). Gregory Jenkins denied any concerns or needs.  I indicted I would contact them on Monday.  Gayleen Orem, RN, BSN, [place] at [facility] [phone]    Time Spent with Patient: 15 (12/13/15 1205)

## 2015-12-13 NOTE — Progress Notes (Signed)
  Oncology Nurse Navigator Documentation  Navigator Location: [facility] (12/10/15 0352) Navigator Encounter Type: Clinic/MDC (12/10/15 4818)             Treatment Phase: Pre-Tx/Tx Discussion (12/10/15 5909)       To provide support and care continuity, met with patient during [facility] [place].    I arrived him to designated Exam Room, obtained and documented VS.  Provided him a verbal and written overview of [facility] and the clinicians who he will be seeing, encouraged him to ask questions during their time with him.  Spoke with him at end of [facility], he indicated an appreciation for the format.  We reviewed upcoming appts for the remainder of the week, including today's 11:15 lab and 11:45 appt with Dr. Alvy Bimler, Thursday's 12:30 arrival to [facility] for PEG and [facility] placement, pick-up of barium contrast at [facility] later today for use Wed evening. They expressed appreciation for my navigation, understand I will join them later today and on Thursday.  Gayleen Orem, RN, BSN, [place] at [place] [phone]                          Time Spent with Patient: 45 (12/10/15 0847)

## 2015-12-13 NOTE — Progress Notes (Signed)
  Oncology Nurse Navigator Documentation  Navigator Location: [facility] (12/10/15 1145) Navigator Encounter Type: Clinic/MDC (12/10/15 1145)           Patient Visit Type: MedOnc (12/10/15 1145) Treatment Phase: Pre-Tx/Tx Discussion (12/10/15 1145)     Met briefly with patient and his wife following appt with Dr. Alvy Bimler.   He is moving forward treatment plan:  New Start Tomotherapy and 1st Cetuximab next Wednesday, 12/18/15.  He has Chemo Education following this appt. They understand I will see them Thursday afternoon following his PAC and PEG placement.  Gayleen Orem, RN, BSN, [place] at [place] [phone]                            Time Spent with Patient: 15 (12/10/15 1145)

## 2015-12-13 NOTE — Progress Notes (Signed)
  Oncology Nurse Navigator Documentation  Navigator Location: [facility] (12/12/15 1730) Navigator Encounter Type: Education (12/12/15 1730)           Patient Visit Type: Inpatient (Short Stay) (12/12/15 1730) Treatment Phase: Pre-Tx/Tx Discussion (12/12/15 1730)       Met with Mr. Frediani and his wife in [place] s/p his PAC and PEG placement to provide initial PEG care/use instruction. Using PEG instructional device and Teach Back, provided education including care of tube insertion site, including dressing change; showering; when to clamp/unclamp for gravity bolus administration of nutritional supplement, hydration and medications.   Patient's wife provided accurate return demonstration for bolus instillation and dsg change. I reviewed with them instructions on Percocet Rx wife had just picked up at [place]. Provided patient the following PEG supplies:  2x2 gauze  Split gauze  Cotton-tip applicators  588 mL bottle of saline   Paper tape [facility] Rn had been by earlier, provided them 60 cc syringe and educational material. I encouraged ambulation this evening, proactive pain control.  They voiced understanding. They understand I will contact them tomorrow to check on his well-being.  Gayleen Orem, RN, BSN, [place] at [place] [phone]                           Time Spent with Patient: 30 (12/12/15 1730)

## 2015-12-16 ENCOUNTER — Telehealth: Payer: Self-pay | Admitting: *Deleted

## 2015-12-16 NOTE — Telephone Encounter (Signed)
  Oncology Nurse Navigator Documentation  Navigator Location: [facility] (12/16/15 1250) Navigator Encounter Type: Telephone (12/16/15 1250) Telephone: Outgoing Call;Symptom Mgt (12/16/15 1250)           Treatment Phase: Pre-Tx/Tx Discussion (12/16/15 1250) Barriers/Navigation Needs: Education (12/16/15 1250)       Returned patient wife's VMM from this morning. She provided weekend update for her husband s/p his PEG and PAC last week Thursday:  No concerns with PAC.  Pain associated with PEG insertion has been well controlled.  PEG incision site WNL.  Ambulation notably improved by Sunday.  Eating/drinking well, eating smaller meals more frequently.  Flushing PEG daily without difficulty.   Has been a couple of days since last BM, having flatulence.  Tried single dose of Miralax yesterday with no effect followed by single dose of Milk of Magnesia without effect. I suggested trying BID doses of Miralax today and dose in morning if needed.  I noted I would check with him tomorrow to check on his well being.  Gayleen Orem, RN, BSN, [place] at [place] [phone]                        Time Spent with Patient: 15 (12/16/15 1250)

## 2015-12-17 ENCOUNTER — Ambulatory Visit
Admission: RE | Admit: 2015-12-17 | Discharge: 2015-12-17 | Disposition: A | Discharge status: Home / Self Care | Payer: 59 | Source: Ambulatory Visit | Attending: Radiation Oncology | Admitting: Radiation Oncology

## 2015-12-17 ENCOUNTER — Encounter: Payer: Self-pay | Admitting: *Deleted

## 2015-12-17 ENCOUNTER — Encounter: Payer: Self-pay | Admitting: Radiation Oncology

## 2015-12-17 VITALS — BP 108/64 | HR 62 | Temp 98.0°F | Wt 200.3 lb

## 2015-12-17 DIAGNOSIS — Z510 Encounter for antineoplastic radiation therapy: Secondary | ICD-10-CM | POA: Diagnosis not present

## 2015-12-17 DIAGNOSIS — R093 Abnormal sputum: Secondary | ICD-10-CM | POA: Insufficient documentation

## 2015-12-17 DIAGNOSIS — C099 Malignant neoplasm of tonsil, unspecified: Secondary | ICD-10-CM

## 2015-12-17 DIAGNOSIS — C090 Malignant neoplasm of tonsillar fossa: Secondary | ICD-10-CM

## 2015-12-17 DIAGNOSIS — Z923 Personal history of irradiation: Secondary | ICD-10-CM | POA: Insufficient documentation

## 2015-12-17 DIAGNOSIS — K5900 Constipation, unspecified: Secondary | ICD-10-CM | POA: Insufficient documentation

## 2015-12-17 MED ORDER — SONAFINE EX EMUL
1.0000 "application " | Freq: Once | CUTANEOUS | Status: AC
  Administered 2015-12-17: 1 via TOPICAL
  Filled 2015-12-17: qty 45

## 2015-12-17 NOTE — Progress Notes (Signed)
  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 . . . . . . . The patient presents for routine under treatment assessment. Gregory Jenkins presents for his 1st fraction of radiation to his Left Tonsil and bilateral neck. He denies any pain. He reports he is eating and drinking well. He reports excess sputum and carries a cup with him to spit in during the day. He does report some constipation, is taking miralax daily, and reports he had a bowel movement yesterday. He complains of soreness at his PEG site and reports he is flushing his PEG daily, but is not using it for nutrition at this time. He has it placed last Thursday. He will start chemotherapy tomorrow and is anxious about that.                                 Set-up films were reviewed.                                 The chart was checked. Physical Findings. . .  weight is 200 lb 4.8 oz (90.855 kg). His temperature is 98 F (36.7 C). His blood pressure is 108/64 and his pulse is 62.  Lungs are clear to auscultation bilaterally. Heart has regular rate and rhythm. Neck is notable for bilateral fullness. PEG tube site is clean with no sign of infection. Oral cavity shows no secondary infections. Impression . . . . . . . The patient is tolerating radiation. Plan . . . . . . . . . . . . Continue treatment as planned.  ________________________________   Blair Promise, PhD, MD  This document serves as a record of services personally performed by Gery Pray, MD. It was created on his behalf by Darcus Austin, a trained medical scribe. The creation of this record is based on the scribe's personal observations and the provider's statements to them. This document has been checked and  approved by the attending provider.

## 2015-12-17 NOTE — Progress Notes (Signed)
Gregory Jenkins presents for his 1st fraction of radiation to his Left Tonsil and bilateral neck. He denies any pain. He reports he is eating and drinking well. He reports excess sputum, and carries a cup with him to spit in during the day. He does report some constipation, and is taking miralax daily, and reports he had a bowel movement yesterday. He complains of soreness at his PEG site, he reports he is flushing his PEG daily, but is not using it for nutrition at this time. He will start chemotherapy tomorrow, and is anxious about that.   BP 108/64 mmHg  Pulse 62  Temp(Src) 98 F (36.7 C)  Wt 200 lb 4.8 oz (90.855 kg)   Wt Readings from Last 3 Encounters:  12/17/15 200 lb 4.8 oz (90.855 kg)  12/12/15 202 lb (91.627 kg)  12/10/15 202 lb 6.4 oz (91.808 kg)

## 2015-12-17 NOTE — Progress Notes (Signed)
Pt here for patient teaching.  Pt given Radiation and You booklet, Managing Acute Radiation Side Effects for Head and Neck Cancer handout, skin care instructions and Sonafine. Pt reports they have not watched the Radiation Therapy Education video, but were given the link to watch at home.  Reviewed areas of pertinence such as fatigue, hair loss, mouth changes, skin changes, throat changes, breast swelling, cough, shortness of breath, earaches and taste changes . Pt able to give teach back of to pat skin, use unscented/gentle soap, have Imodium on hand and drink plenty of water,apply Sonafine bid, avoid applying anything to skin within 4 hours of treatment and to use an electric razor if they must shave. Pt verbalizes understanding of information given and will contact nursing with any questions or concerns.     Http://rtanswers.org/treatmentinformation/whattoexpect/index  Managing Acute Radiation Side Effects for Head and Neck Cancer  Skin irritation:  . Sonafine  Topical Emulsion: First-line topical cream to help soothe skin irritation.  Apply to skin in radiation fields at least 4 hours before radiotherapy, or any time after treatments during the rest of the day.  . Triple Antibiotic Ointment (Neosporin): Apply to areas of skin with moist breakdown to prevent infection.  . 1% hydrocortisone cream: Apply to areas of skin that are itching, up to three times a day.  Arnetha Massy (Silver Sulfadiazine): Used in select cases if large patches of skin develop moist breakdown (let physician or nurse know if you have a "sulfa" drug allergy)  Soreness in mouth or throat: . Baking Soda Rinse: a home remedy to soothe/cleanse mouth and loosen thick saliva.  Mix 1/2 teaspoon salt, 1/2 teaspoon baking soda, 1 pint water (16 oz or two cups).  Swish, gargle and spit as needed to soothe/cleanse mouth. Use as often as you want.  . Sucralfate (Carafate): coats throat to soothe it before meals or any time of day.  Crush 1 tablet in 10 mL H20 and swallow up to four times a day.  . 2% viscous Lidocaine (Magic Mouth Wash): Soothes mouth and/or throat by numbing your mucous membranes. Mix 1 part 2% viscous lidocaine (Magic Mouth Wash), 1 part H20. Swish and/or swallow 68mL of this mixture, 44min before meals and at bedtime, up to four times a day. Alternate with Sucralfate (Carafate).  . Narcotics: Various short acting and long acting narcotics can be prescribed.  Often, medical oncology will prescribe these if you are receiving chemotherapy concurrently. Narcotics may cause constipation. It may be helpful to take a stool softener (Docusate Sodium) or gentle laxative (ie Senna or Polyethylene Glycol) to prevent constipation.  Having food in your stomach before ingesting a narcotic may reduce risk of stomach upset.  Thick Saliva: . Baking Soda Rinse: a home remedy to soothe/cleanse mouth and loosen thick saliva.  Mix 1/2 teaspoon salt, 1/2 teaspoon baking soda, 1 pint water (16 oz or two cups).  Swish, gargle, and spit as needed to soothe/cleanse mouth. Use as often as you want.  . Some patients find Diet Ginger Ale or Papaya Juice to be helpful.  . In extreme cases, your physician may consider prescribing a Scopolamine transdermal patch which dries up your saliva.     Poor taste, or lack of taste:   . There are no well-established medications to combat taste bud changes from radiotherapy.  It often takes weeks to months to regain taste function.  Eating bland foods and drinking nutritional shakes  may help you maintain your weight when food is not enjoyable.  Some patients supplement their oral intake with a feeding tube.  Fatigue and weakness: . There is not a well-established safe and effective medication to combat radiation-induced fatigue.  However, if you are able to perform light exercise (such as a daily walk, yoga, recumbent stationary bicycling), this may combat fatigue and help you maintain muscle mass  during treatment.  . Maintaining hydration and nutrition are also important.  If you have not been referred to a nutritionist and would like a referral, please let your nurse or physician know.  . Try to get at least 8 hours of sleep each night. You may need a daily nap, but try not to nap so late that it interferes with your nightly sleep schedule.

## 2015-12-18 ENCOUNTER — Encounter: Payer: Self-pay | Admitting: *Deleted

## 2015-12-18 ENCOUNTER — Ambulatory Visit: Payer: 59 | Admitting: Nutrition

## 2015-12-18 ENCOUNTER — Telehealth: Payer: Self-pay | Admitting: *Deleted

## 2015-12-18 ENCOUNTER — Ambulatory Visit
Admission: RE | Admit: 2015-12-18 | Discharge: 2015-12-18 | Disposition: A | Discharge status: Home / Self Care | Payer: 59 | Source: Ambulatory Visit | Attending: Radiation Oncology | Admitting: Radiation Oncology

## 2015-12-18 ENCOUNTER — Ambulatory Visit (HOSPITAL_BASED_OUTPATIENT_CLINIC_OR_DEPARTMENT_OTHER): Payer: 59

## 2015-12-18 ENCOUNTER — Inpatient Hospital Stay: Payer: 59

## 2015-12-18 VITALS — BP 113/56 | HR 59 | Temp 98.2°F | Resp 16

## 2015-12-18 DIAGNOSIS — C099 Malignant neoplasm of tonsil, unspecified: Secondary | ICD-10-CM | POA: Diagnosis not present

## 2015-12-18 DIAGNOSIS — Z5112 Encounter for antineoplastic immunotherapy: Secondary | ICD-10-CM | POA: Diagnosis not present

## 2015-12-18 DIAGNOSIS — Z510 Encounter for antineoplastic radiation therapy: Secondary | ICD-10-CM | POA: Diagnosis not present

## 2015-12-18 MED ORDER — SODIUM CHLORIDE 0.9 % IV SOLN
Freq: Once | INTRAVENOUS | Status: AC
  Administered 2015-12-18: 12:00:00 via INTRAVENOUS

## 2015-12-18 MED ORDER — DIPHENHYDRAMINE HCL 50 MG/ML IJ SOLN
INTRAMUSCULAR | Status: AC
  Filled 2015-12-18: qty 1

## 2015-12-18 MED ORDER — DIPHENHYDRAMINE HCL 50 MG/ML IJ SOLN
50.0000 mg | Freq: Once | INTRAMUSCULAR | Status: AC
  Administered 2015-12-18: 50 mg via INTRAVENOUS

## 2015-12-18 MED ORDER — SODIUM CHLORIDE 0.9% FLUSH
10.0000 mL | INTRAVENOUS | Status: DC | PRN
  Administered 2015-12-18: 10 mL
  Filled 2015-12-18: qty 10

## 2015-12-18 MED ORDER — CETUXIMAB CHEMO IV INJECTION 200 MG/100ML
400.0000 mg/m2 | Freq: Once | INTRAVENOUS | Status: AC
  Administered 2015-12-18: 900 mg via INTRAVENOUS
  Filled 2015-12-18: qty 400

## 2015-12-18 MED ORDER — HEPARIN SOD (PORK) LOCK FLUSH 100 UNIT/ML IV SOLN
500.0000 [IU] | Freq: Once | INTRAVENOUS | Status: AC | PRN
  Administered 2015-12-18: 500 [IU]
  Filled 2015-12-18: qty 5

## 2015-12-18 NOTE — Telephone Encounter (Signed)
  Oncology Nurse Navigator Documentation  Navigator Location: [facility] (12/18/15 0855) Navigator Encounter Type: Telephone (12/18/15 0855) Telephone: [facility] Cro[facility] Call (12/18/15 0855)               Education: Preparing for Upcoming Surgery/ Treatment (12/18/15 0855)      Spoke with patient's wife, reviewed sequence of appts for this morning, noting specifically:  His port will be accessed and labs drawn at 10:30 prior to 11:20 XRT,   Emla cream should be applied ~ an hour before arriving to [facility].   She verbalized appreciation for call.  Hadn't planned to apply Emla until after XRT.  Gayleen Orem, RN, BSN, [place] at [place] [phone]                     Time Spent with Patient: 15 (12/18/15 0855)

## 2015-12-18 NOTE — Progress Notes (Signed)
Nutrition follow-up completed with patient diagnosed with tonsil cancer, during infusion. Patient is status post feeding tube placement. Reports no difficulty flushing feeding tube with water. Reports expected pain after procedure. Is beginning first chemotherapy today. Weight decreased and documented as 200 pounds down from 203 pounds on February 14. No other nutrition impact symptoms.  Nutrition diagnosis: Predicted suboptimal energy intake continues.  Intervention: Enforced importance of frequent small meals using high-calorie, high-protein foods. Recommended patient consume oral nutrition supplements daily. Provided support and encouragement. Questions answered.  Teach back method used.  Monitoring, evaluation, goals: Patient will work to increase calories and protein to promote maintenance of lean body mass.  Next visit: Wednesday, March 1 during infusion.  **Disclaimer: This note was dictated with voice recognition software. Similar sounding words can inadvertently be transcribed and this note may contain transcription errors which may not have been corrected upon publication of note.**

## 2015-12-18 NOTE — Progress Notes (Signed)
  Oncology Nurse Navigator Documentation  Navigator Location: [facility] (12/18/15 1305) Navigator Encounter Type: Initial MedOnc;Treatment (12/18/15 1305)           Patient Visit Type: MedOnc (12/18/15 1305) Treatment Phase: First Chemo Tx (12/18/15 1305)     Met with patient in Infusion where he was about to receive his first Erbitux infusion.  He was accompanied by his wife and his son.  He reported further reduction in abdominal pain, increasing mobility since PEG placement last Thursday.  Still taking PRN Percocet but not as frequently as over weekend.  We reviewed what he has accomplished leading up to the start of XRT and Erbitux.  He recognized his accomplishments.  I supported Infusion RN Abigail Butts in her explanation of Erbitux SEs.  His wife reported that wife of former H&N patient, friend from church, has been very supportive during the pre-tmt activities, has helped her to prepare for the challenges ahead as tmts progress.  I provided precautionary support to Infusion RN during the initial minutes of Erbitux infusion, then excused myself. Patient and wife voiced appreciation for my navigation support, voiced understanding that i can be contacted with needs/concerns.  Gayleen Orem, RN, BSN, [place] at [place] [phone]                           Time Spent with Patient: 75 (12/18/15 1305)

## 2015-12-18 NOTE — Patient Instructions (Signed)
Cetuximab injection What is this medicine? CETUXIMAB (se TUX i mab) is a monoclonal antibody. It is used to treat colorectal cancer and head and neck cancer. This medicine may be used for other purposes; ask your health care provider or pharmacist if you have questions. What should I tell my health care provider before I take this medicine? They need to know if you have any of these conditions: -heart disease -history of irregular heartbeat -history of low levels of calcium, magnesium, or potassium in the blood -lung or breathing disease, like asthma -an unusual or allergic reaction to cetuximab, other medicines, foods, dyes, or preservatives -pregnant or trying to get pregnant -breast-feeding How should I use this medicine? This drug is given as an infusion into a vein. It is administered in a hospital or clinic by a specially trained health care professional. Talk to your pediatrician regarding the use of this medicine in children. Special care may be needed. Overdosage: If you think you have taken too much of this medicine contact a poison control center or emergency room at once. NOTE: This medicine is only for you. Do not share this medicine with others. What if I miss a dose? It is important not to miss your dose. Call your doctor or health care professional if you are unable to keep an appointment. What may interact with this medicine? Interactions are not expected. This list may not describe all possible interactions. Give your health care provider a list of all the medicines, herbs, non-prescription drugs, or dietary supplements you use. Also tell them if you smoke, drink alcohol, or use illegal drugs. Some items may interact with your medicine. What should I watch for while using this medicine? Visit your doctor or health care professional for regular checks on your progress. This drug may make you feel generally unwell. This is not uncommon, as chemotherapy can affect healthy cells  as well as cancer cells. Report any side effects. Continue your course of treatment even though you feel ill unless your doctor tells you to stop. This medicine can make you more sensitive to the sun. Keep out of the sun while taking this medicine and for 2 months after the last dose. If you cannot avoid being in the sun, wear protective clothing and use sunscreen. Do not use sun lamps or tanning beds/booths. You may need blood work done while you are taking this medicine. In some cases, you may be given additional medicines to help with side effects. Follow all directions for their use. Call your doctor or health care professional for advice if you get a fever, chills or sore throat, or other symptoms of a cold or flu. Do not treat yourself. This drug decreases your body's ability to fight infections. Try to avoid being around people who are sick. Avoid taking products that contain aspirin, acetaminophen, ibuprofen, naproxen, or ketoprofen unless instructed by your doctor. These medicines may hide a fever. Do not become pregnant while taking this medicine. Women should inform their doctor if they wish to become pregnant or think they might be pregnant. There is a potential for serious side effects to an unborn child. Use adequate birth control methods. Avoid pregnancy for at least 6 months after your last dose. Talk to your health care professional or pharmacist for more information. Do not breast-feed an infant while taking this medicine or during the 2 months after your last dose. What side effects may I notice from receiving this medicine? Side effects that you should report to   your doctor or health care professional as soon as possible: -allergic reactions like skin rash, itching or hives, swelling of the face, lips, or tongue -breathing problems -changes in vision -fast, irregular heartbeat -feeling faint or lightheaded, falls -fever, chills -mouth sores -redness, blistering, peeling or  loosening of the skin, including inside the mouth -trouble passing urine or change in the amount of urine -unusually weak or tired Side effects that usually do not require medical attention (report to your doctor or health care professional if they continue or are bothersome): -changes in skin like acne, cracks, skin dryness -constipation -diarrhea -headache -nail changes -nausea, vomiting -stomach upset -weight loss This list may not describe all possible side effects. Call your doctor for medical advice about side effects. You may report side effects to FDA at 1-800-FDA-1088. Where should I keep my medicine? This drug is given in a hospital or clinic and will not be stored at home. NOTE: This sheet is a summary. It may not cover all possible information. If you have questions about this medicine, talk to your doctor, pharmacist, or health care provider.    2016, Elsevier/Gold Standard. (2014-12-19 22:27:08)  

## 2015-12-18 NOTE — Progress Notes (Signed)
No new labs needed for treatment today per Dr Alvy Bimler

## 2015-12-18 NOTE — Progress Notes (Signed)
  Oncology Nurse Navigator Documentation  Navigator Location: [facility] (12/17/15 0850) Navigator Encounter Type: Treatment (12/17/15 0850)           Patient Visit Type: GJFTNB (12/17/15 0850) Treatment Phase: First Radiation Tx (12/17/15 0850) Barriers/Navigation Needs: Education (12/17/15 0850) Education: Other (12/17/15 0850) Interventions: Education Method (12/17/15 0850)     Education Method: Verbal (12/17/15 0850)        To provide support and encouragement, care continuity and to assess for needs, met with patient during his New Start Tomo XRT.  He was accompanied by his wife.  He managed first tmt without difficulty.  His wife watched procedure, I explained the 2-step imaging and tmt process. She expressed appreciation for learning opportunity. He additionally reported:  BM yesterday after an additional dose of Miralax.  I guided to continue daily dosage while continuing to take Percocet for managing post-Peg placement pain.  Increased ambulation yesterday with reduction in abdominal discomfort.  Daily flushing of PEG and dsg change without difficulty. Wife stated there has been minimal discharge at insertion site, absence of infection S&S. I escorted them to [facility] in [place]. They understand to contact me with questions/concerns.  Gayleen Orem, RN, BSN, [place] at [place] [phone]             Time Spent with Patient: 45 (12/17/15 0850)

## 2015-12-18 NOTE — Progress Notes (Signed)
  Oncology Nurse Navigator Documentation  Navigator Location: [facility] (12/18/15 1145) Navigator Encounter Type: Treatment (12/18/15 1145)           Patient Visit Type: RadOnc (12/18/15 1145) Treatment Phase: Treatment (12/18/15 1145)       Met with patient following his second Tomo tmt.  He was accompanied by his wife. He completed tmt without difficult, noted "a little easier this time". I directed them to return to lobby to register for 1st infusion, noted that I would join them later.  Gayleen Orem, RN, BSN, [place] at [place] [phone]                         Time Spent with Patient: 15 (12/18/15 1145)

## 2015-12-19 ENCOUNTER — Ambulatory Visit
Admission: RE | Admit: 2015-12-19 | Discharge: 2015-12-19 | Disposition: A | Discharge status: Home / Self Care | Payer: 59 | Source: Ambulatory Visit | Attending: Radiation Oncology | Admitting: Radiation Oncology

## 2015-12-19 ENCOUNTER — Telehealth: Payer: Self-pay | Admitting: *Deleted

## 2015-12-19 DIAGNOSIS — Z510 Encounter for antineoplastic radiation therapy: Secondary | ICD-10-CM | POA: Diagnosis not present

## 2015-12-19 NOTE — Telephone Encounter (Signed)
  Oncology Nurse Navigator Documentation  Navigator Location: [facility] (12/19/15 1431)   Telephone: Outgoing Call;Symptom Mgt (12/19/15 1431)       Encountered patient as he was leaving [place] following XRT.  He reported a "pounding headache" yesterday following his infusion (Erbitux).  He took ibuprofen which brought some relief but HA still present this morning.  He has not taken additional ibuprofen and is hesitant to take Tylenol, "not sure I should".  I noted I would update Dr. Alvy Bimler, get back to him with her guidance.  She indicated additional ibuprofen or Tylenol would be fine, I relayed that information to Gregory Jenkins.  Gayleen Orem, RN, BSN, [place] at [place] [phone]                                     Time Spent with Patient: 15 (12/19/15 1431)

## 2015-12-20 ENCOUNTER — Ambulatory Visit
Admission: RE | Admit: 2015-12-20 | Discharge: 2015-12-20 | Disposition: A | Discharge status: Home / Self Care | Payer: 59 | Source: Ambulatory Visit | Attending: Radiation Oncology | Admitting: Radiation Oncology

## 2015-12-20 ENCOUNTER — Encounter: Payer: Self-pay | Admitting: *Deleted

## 2015-12-20 ENCOUNTER — Telehealth: Payer: Self-pay | Admitting: *Deleted

## 2015-12-20 DIAGNOSIS — Z510 Encounter for antineoplastic radiation therapy: Secondary | ICD-10-CM | POA: Diagnosis not present

## 2015-12-20 NOTE — Telephone Encounter (Signed)
-----   Message from Lin Givens, RN sent at 12/18/2015  2:03 PM EST ----- Regarding: chemo follow up call Contact: [phone] Patient with first Erbitux treatment

## 2015-12-20 NOTE — Progress Notes (Signed)
  Oncology Nurse Navigator Documentation  Navigator Location: [facility] (12/20/15 0845) Navigator Encounter Type: Treatment (12/20/15 0845)             Treatment Phase: Active Tx (12/20/15 0845) Barriers/Navigation Needs: Education (12/20/15 0845) Education: Coping with Diagnosis/ Prognosis (12/20/15 0845) Interventions: Education Method (12/20/15 0845)     Education Method: Verbal (12/20/15 0845)      Met with Gregory Jenkins prior to his scheduled tmt.  He expressed concern about his prognosis, the time span of his tmts.  "I've been talking to other people about their cancer tmts.  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  I reminded him that he will be seeing Dr. Isidore Moos on Monday after XRT, that he ask her additional information.  He voiced understanding.  Gayleen Orem, RN, BSN, [place] at [place] [phone]                    Time Spent with Patient: 15 (12/20/15 0845)

## 2015-12-20 NOTE — Telephone Encounter (Signed)
Called patient for chemo follow up. Pt states he felt "bad" the day after chemo. Had HA that was addressed by Nurse Navigator. Some nausea- relief with antiemetics. No problems with bowels. Has been feeling "cold and chilled" at night- does not think he has had any fever. Instructed to check temp if he has another episode and call us if temp is 100.5 or greater. States he is feeling much better today.

## 2015-12-23 ENCOUNTER — Ambulatory Visit
Admission: RE | Admit: 2015-12-23 | Discharge: 2015-12-23 | Disposition: A | Discharge status: Home / Self Care | Payer: 59 | Source: Ambulatory Visit | Attending: Radiation Oncology | Admitting: Radiation Oncology

## 2015-12-23 ENCOUNTER — Encounter: Payer: Self-pay | Admitting: Radiation Oncology

## 2015-12-23 VITALS — BP 113/58 | HR 64 | Temp 97.6°F | Ht 71.5 in | Wt 198.5 lb

## 2015-12-23 DIAGNOSIS — C090 Malignant neoplasm of tonsillar fossa: Secondary | ICD-10-CM

## 2015-12-23 DIAGNOSIS — Z510 Encounter for antineoplastic radiation therapy: Secondary | ICD-10-CM | POA: Diagnosis not present

## 2015-12-23 MED ORDER — LIDOCAINE VISCOUS 2 % MT SOLN: OROMUCOSAL | Status: DC

## 2015-12-23 MED ORDER — SUCRALFATE 1 G PO TABS: ORAL_TABLET | ORAL | Status: DC

## 2015-12-23 NOTE — Progress Notes (Signed)
Mr. Gressman is here for his 5th fraction of radiation to his Left Tonsil. He reports he is eating well. He is eating smaller portions, but more frequent meals during the day. He is only flushing his PEG tube. He reports he is not modifying his diet at this time. He did feel bad after his chemo on Wednesday, but improved after a day. He does have a rash to his chest and shoulders from Irbitux which he is using cream for. He is also using the sonafine cream to his neck area twice daily. He denies any mouth pain, and started using baking soda and salt rinses this am. He is using the fluoride trays nightly. He is having daily bowel movements.   BP 113/58 mmHg  Pulse 64  Temp(Src) 97.6 F (36.4 C)  Ht 5' 11.5" (1.816 m)  Wt 198 lb 8 oz (90.039 kg)  BMI 27.30 kg/m2  SpO2 99%   Wt Readings from Last 3 Encounters:  12/23/15 198 lb 8 oz (90.039 kg)  12/17/15 200 lb 4.8 oz (90.855 kg)  12/12/15 202 lb (91.627 kg)

## 2015-12-23 NOTE — Progress Notes (Signed)
IMRT Device Note 12-17-15    ICD-9-CM ICD-10-CM   1. 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 The code is [phone].  -----------------------------------  Eppie Gibson, MD

## 2015-12-23 NOTE — Progress Notes (Signed)
Weekly Management Note:  Outpatient    ICD-9-CM ICD-10-CM   1. 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  CBCT/MVCT images/Port film x-rays were reviewed.  The chart was checked. He reports he is eating well. He is eating smaller portions, but more frequent meals during the day. He is only flushing his PEG tube. He reports he is not modifying his diet at this time. He did feel bad after his chemo on Wednesday, but improved after a day. He does have a rash to his chest and shoulders from Erbitux which he is using cream for. He is also using the sonafine cream to his neck area twice daily. He denies any mouth pain, and started using baking soda and salt rinses this am. He is using the fluoride trays nightly. He is having daily bowel movements.   Physical Findings:  Wt Readings from Last 3 Encounters:  12/23/15 198 lb 8 oz (90.039 kg)  12/17/15 200 lb 4.8 oz (90.855 kg)  12/12/15 202 lb (91.627 kg)    height is 5' 11.5" (1.816 m) and weight is 198 lb 8 oz (90.039 kg). His temperature is 97.6 F (36.4 C). His blood pressure is 113/58 and his pulse is 64. His oxygen saturation is 99%.   Skin erythematous and intact. Palpable masses in neck bilaterally.  No mucositis in oropharynx. No thrush.  Erythematous sclera bilaterally (wife states this is normal for him)  CBC    Component Value Date/Time   WBC 6.8 12/12/2015 1317   WBC 5.7 12/10/2015 1043   RBC 4.63 12/12/2015 1317   RBC 4.71 12/10/2015 1043   HGB 14.4 12/12/2015 1317   HGB 14.6 12/10/2015 1043   HCT 43.5 12/12/2015 1317   HCT 44.0 12/10/2015 1043   PLT 213 12/12/2015 1317   PLT 203 12/10/2015 1043   MCV 94.0 12/12/2015 1317   MCV 93.5 12/10/2015 1043   MCH 31.1 12/12/2015 1317   MCH 30.9 12/10/2015 1043   MCHC 33.1 12/12/2015  1317   MCHC 33.1 12/10/2015 1043   RDW 12.9 12/12/2015 1317   RDW 13.3 12/10/2015 1043   LYMPHSABS 1.6 12/12/2015 1317   LYMPHSABS 1.5 12/10/2015 1043   MONOABS 0.5 12/12/2015 1317   MONOABS 0.6 12/10/2015 1043   EOSABS 0.1 12/12/2015 1317   EOSABS 0.1 12/10/2015 1043   BASOSABS 0.0 12/12/2015 1317   BASOSABS 0.0 12/10/2015 1043     CMP     Component Value Date/Time   NA 136 12/12/2015 1317   NA 138 12/10/2015 1043   K 3.9 12/12/2015 1317   K 5.4 No visable hemolysis* 12/10/2015 1043   CL 103 12/12/2015 1317   CO2 24 12/12/2015 1317   CO2 28 12/10/2015 1043   GLUCOSE 91 12/12/2015 1317   GLUCOSE 104 12/10/2015 1043   BUN 17 12/12/2015 1317   BUN 17.1 12/10/2015 1043   CREATININE 1.11 12/12/2015 1317   CREATININE 1.2 12/10/2015 1043   CALCIUM 9.0 12/12/2015 1317   CALCIUM 10.1 12/10/2015 1043   PROT 7.4 12/10/2015 1043   PROT 7.0 09/24/2009 1331   ALBUMIN 3.6 12/10/2015 1043   ALBUMIN 4.1 09/24/2009 1331   AST 19 12/10/2015 1043   AST 18 09/24/2009 1331   ALT 21 12/10/2015 1043   ALT 40 09/24/2009  1331   ALKPHOS 81 12/10/2015 1043   ALKPHOS 123* 09/24/2009 1331   BILITOT 0.73 12/10/2015 1043   BILITOT 0.3 09/24/2009 1331   GFRNONAA >60 12/12/2015 1317   GFRAA >60 12/12/2015 1317     Impression:  The patient is tolerating radiotherapy.   Plan:  Continue radiotherapy as planned. Rx Sucralfate and Lidocaine for odynophagia PRN  -----------------------------------  Eppie Gibson, MD

## 2015-12-24 ENCOUNTER — Ambulatory Visit
Admission: RE | Admit: 2015-12-24 | Discharge: 2015-12-24 | Disposition: A | Discharge status: Home / Self Care | Payer: 59 | Source: Ambulatory Visit | Attending: Radiation Oncology | Admitting: Radiation Oncology

## 2015-12-24 DIAGNOSIS — Z510 Encounter for antineoplastic radiation therapy: Secondary | ICD-10-CM | POA: Diagnosis not present

## 2015-12-25 ENCOUNTER — Ambulatory Visit (HOSPITAL_BASED_OUTPATIENT_CLINIC_OR_DEPARTMENT_OTHER): Payer: 59 | Admitting: Hematology and Oncology

## 2015-12-25 ENCOUNTER — Other Ambulatory Visit (HOSPITAL_BASED_OUTPATIENT_CLINIC_OR_DEPARTMENT_OTHER): Payer: 59

## 2015-12-25 ENCOUNTER — Ambulatory Visit: Payer: 59 | Admitting: Nutrition

## 2015-12-25 ENCOUNTER — Telehealth: Payer: Self-pay | Admitting: Hematology and Oncology

## 2015-12-25 ENCOUNTER — Ambulatory Visit: Payer: Self-pay | Admitting: Hematology and Oncology

## 2015-12-25 ENCOUNTER — Ambulatory Visit: Payer: 59

## 2015-12-25 ENCOUNTER — Ambulatory Visit
Admission: RE | Admit: 2015-12-25 | Discharge: 2015-12-25 | Disposition: A | Discharge status: Home / Self Care | Payer: 59 | Source: Ambulatory Visit | Attending: Radiation Oncology | Admitting: Radiation Oncology

## 2015-12-25 ENCOUNTER — Encounter: Payer: Self-pay | Admitting: Hematology and Oncology

## 2015-12-25 ENCOUNTER — Ambulatory Visit (HOSPITAL_BASED_OUTPATIENT_CLINIC_OR_DEPARTMENT_OTHER): Payer: 59

## 2015-12-25 VITALS — BP 112/63 | HR 61 | Temp 97.6°F | Resp 18 | Wt 197.4 lb

## 2015-12-25 DIAGNOSIS — Z5112 Encounter for antineoplastic immunotherapy: Secondary | ICD-10-CM | POA: Diagnosis not present

## 2015-12-25 DIAGNOSIS — C099 Malignant neoplasm of tonsil, unspecified: Secondary | ICD-10-CM

## 2015-12-25 DIAGNOSIS — R634 Abnormal weight loss: Secondary | ICD-10-CM

## 2015-12-25 DIAGNOSIS — K9281 Gastrointestinal mucositis (ulcerative): Secondary | ICD-10-CM

## 2015-12-25 DIAGNOSIS — Z510 Encounter for antineoplastic radiation therapy: Secondary | ICD-10-CM | POA: Diagnosis not present

## 2015-12-25 DIAGNOSIS — E441 Mild protein-calorie malnutrition: Secondary | ICD-10-CM | POA: Diagnosis not present

## 2015-12-25 DIAGNOSIS — K9429 Other complications of gastrostomy: Secondary | ICD-10-CM

## 2015-12-25 DIAGNOSIS — L270 Generalized skin eruption due to drugs and medicaments taken internally: Secondary | ICD-10-CM | POA: Insufficient documentation

## 2015-12-25 DIAGNOSIS — K1230 Oral mucositis (ulcerative), unspecified: Secondary | ICD-10-CM | POA: Diagnosis not present

## 2015-12-25 DIAGNOSIS — Z95828 Presence of other vascular implants and grafts: Secondary | ICD-10-CM

## 2015-12-25 HISTORY — DX: Generalized skin eruption due to drugs and medicaments taken internally: L27.0

## 2015-12-25 LAB — COMPREHENSIVE METABOLIC PANEL
ALBUMIN: 3.3 g/dL — AB (ref 3.5–5.0)
ALT: 19 U/L (ref 0–55)
AST: 14 U/L (ref 5–34)
Alkaline Phosphatase: 78 U/L (ref 40–150)
Anion Gap: 8 mEq/L (ref 3–11)
BILIRUBIN TOTAL: 0.45 mg/dL (ref 0.20–1.20)
BUN: 14.6 mg/dL (ref 7.0–26.0)
CO2: 25 meq/L (ref 22–29)
Calcium: 9.1 mg/dL (ref 8.4–10.4)
Chloride: 103 mEq/L (ref 98–109)
Creatinine: 1 mg/dL (ref 0.7–1.3)
EGFR: 80 mL/min/{1.73_m2} — ABNORMAL LOW (ref >90)
GLUCOSE: 96 mg/dL (ref 70–140)
POTASSIUM: 4.2 meq/L (ref 3.5–5.1)
SODIUM: 136 meq/L (ref 136–145)
Total Protein: 7.1 g/dL (ref 6.4–8.3)

## 2015-12-25 LAB — CBC WITH DIFFERENTIAL/PLATELET
BASO%: 0.3 % (ref 0.0–2.0)
BASOS ABS: 0 10*3/uL (ref 0.0–0.1)
EOS%: 1.5 % (ref 0.0–7.0)
Eosinophils Absolute: 0.1 10*3/uL (ref 0.0–0.5)
HCT: 41.3 % (ref 38.4–49.9)
HEMOGLOBIN: 13.7 g/dL (ref 13.0–17.1)
LYMPH%: 6.2 % — AB (ref 14.0–49.0)
MCH: 30.8 pg (ref 27.2–33.4)
MCHC: 33.2 g/dL (ref 32.0–36.0)
MCV: 92.6 fL (ref 79.3–98.0)
MONO#: 0.8 10*3/uL (ref 0.1–0.9)
MONO%: 8.1 % (ref 0.0–14.0)
NEUT%: 83.9 % — ABNORMAL HIGH (ref 39.0–75.0)
NEUTROS ABS: 7.9 10*3/uL — AB (ref 1.5–6.5)
Platelets: 211 10*3/uL (ref 140–400)
RBC: 4.46 10*6/uL (ref 4.20–5.82)
RDW: 13.2 % (ref 11.0–14.6)
WBC: 9.4 10*3/uL (ref 4.0–10.3)
lymph#: 0.6 10*3/uL — ABNORMAL LOW (ref 0.9–3.3)

## 2015-12-25 LAB — MAGNESIUM: Magnesium: 2.3 mg/dl (ref 1.5–2.5)

## 2015-12-25 MED ORDER — SODIUM CHLORIDE 0.9% FLUSH
10.0000 mL | INTRAVENOUS | Status: DC | PRN
  Administered 2015-12-25: 10 mL via INTRAVENOUS
  Filled 2015-12-25: qty 10

## 2015-12-25 MED ORDER — CLINDAMYCIN PHOSPHATE 1 % EX GEL: Freq: Two times a day (BID) | CUTANEOUS | Status: DC

## 2015-12-25 MED ORDER — HEPARIN SOD (PORK) LOCK FLUSH 100 UNIT/ML IV SOLN
500.0000 [IU] | Freq: Once | INTRAVENOUS | Status: AC | PRN
  Administered 2015-12-25: 500 [IU]
  Filled 2015-12-25: qty 5

## 2015-12-25 MED ORDER — SODIUM CHLORIDE 0.9 % IV SOLN
Freq: Once | INTRAVENOUS | Status: AC
  Administered 2015-12-25: 14:00:00 via INTRAVENOUS

## 2015-12-25 MED ORDER — CETUXIMAB CHEMO IV INJECTION 200 MG/100ML
250.0000 mg/m2 | Freq: Once | INTRAVENOUS | Status: AC
  Administered 2015-12-25: 500 mg via INTRAVENOUS
  Filled 2015-12-25: qty 200

## 2015-12-25 MED ORDER — DIPHENHYDRAMINE HCL 50 MG/ML IJ SOLN
INTRAMUSCULAR | Status: AC
Start: 2015-12-25 — End: 2015-12-25
  Filled 2015-12-25: qty 1

## 2015-12-25 MED ORDER — HYDROCORTISONE 1 % EX OINT
1.0000 | TOPICAL_OINTMENT | Freq: Two times a day (BID) | CUTANEOUS | Status: DC
Start: 2015-12-25 — End: 2016-04-07

## 2015-12-25 MED ORDER — SODIUM CHLORIDE 0.9% FLUSH
10.0000 mL | INTRAVENOUS | Status: DC | PRN
  Administered 2015-12-25: 10 mL
  Filled 2015-12-25: qty 10

## 2015-12-25 MED ORDER — DIPHENHYDRAMINE HCL 50 MG/ML IJ SOLN
50.0000 mg | Freq: Once | INTRAMUSCULAR | Status: AC
Start: 2015-12-25 — End: 2015-12-25
  Administered 2015-12-25: 50 mg via INTRAVENOUS

## 2015-12-25 NOTE — Progress Notes (Signed)
Nutrition follow-up completed with patient and his wife, during infusion for tonsil cancer. Patient tolerated Erbitux with a small amount of nausea and a "bit of a sore mouth". Weight decreased and documented as 197.4 pounds on March 1 decreased from 203 pounds February 14. Patient is flushing feeding tube daily with 60 cc of water. He is having daily bowel movements. Noted patient has sore mouth.  However, patient able to eat regular diet without difficulty.  Nutrition diagnosis: Predicted suboptimal energy intake has evolved into inadequate oral intake as evidenced by 5 pound weight loss over 2 weeks.  Intervention:  Patient was educated to continue small frequent, high-calorie, high-protein meals and snacks. Recommended patient begin using one can of enteral nutrition with 60 cc of free water before and after bolus feeding daily. Provided samples. Explained this helps patient get comfortable with bolus feedings. This also provides an additional 355 cal. Teach back method used.  Monitoring, evaluation, goals: Patient will tolerate adequate calories and protein to minimize further weight loss.  Next visit: Wednesday, March 8, during infusion.  **Disclaimer: This note was dictated with voice recognition software. Similar sounding words can inadvertently be transcribed and this note may contain transcription errors which may not have been corrected upon publication of note.**

## 2015-12-25 NOTE — Patient Instructions (Signed)
Cetuximab injection What is this medicine? CETUXIMAB (se TUX i mab) is a monoclonal antibody. It is used to treat colorectal cancer and head and neck cancer. This medicine may be used for other purposes; ask your health care provider or pharmacist if you have questions. What should I tell my health care provider before I take this medicine? They need to know if you have any of these conditions: -heart disease -history of irregular heartbeat -history of low levels of calcium, magnesium, or potassium in the blood -lung or breathing disease, like asthma -an unusual or allergic reaction to cetuximab, other medicines, foods, dyes, or preservatives -pregnant or trying to get pregnant -breast-feeding How should I use this medicine? This drug is given as an infusion into a vein. It is administered in a hospital or clinic by a specially trained health care professional. Talk to your pediatrician regarding the use of this medicine in children. Special care may be needed. Overdosage: If you think you have taken too much of this medicine contact a poison control center or emergency room at once. NOTE: This medicine is only for you. Do not share this medicine with others. What if I miss a dose? It is important not to miss your dose. Call your doctor or health care professional if you are unable to keep an appointment. What may interact with this medicine? Interactions are not expected. This list may not describe all possible interactions. Give your health care provider a list of all the medicines, herbs, non-prescription drugs, or dietary supplements you use. Also tell them if you smoke, drink alcohol, or use illegal drugs. Some items may interact with your medicine. What should I watch for while using this medicine? Visit your doctor or health care professional for regular checks on your progress. This drug may make you feel generally unwell. This is not uncommon, as chemotherapy can affect healthy cells  as well as cancer cells. Report any side effects. Continue your course of treatment even though you feel ill unless your doctor tells you to stop. This medicine can make you more sensitive to the sun. Keep out of the sun while taking this medicine and for 2 months after the last dose. If you cannot avoid being in the sun, wear protective clothing and use sunscreen. Do not use sun lamps or tanning beds/booths. You may need blood work done while you are taking this medicine. In some cases, you may be given additional medicines to help with side effects. Follow all directions for their use. Call your doctor or health care professional for advice if you get a fever, chills or sore throat, or other symptoms of a cold or flu. Do not treat yourself. This drug decreases your body's ability to fight infections. Try to avoid being around people who are sick. Avoid taking products that contain aspirin, acetaminophen, ibuprofen, naproxen, or ketoprofen unless instructed by your doctor. These medicines may hide a fever. Do not become pregnant while taking this medicine. Women should inform their doctor if they wish to become pregnant or think they might be pregnant. There is a potential for serious side effects to an unborn child. Use adequate birth control methods. Avoid pregnancy for at least 6 months after your last dose. Talk to your health care professional or pharmacist for more information. Do not breast-feed an infant while taking this medicine or during the 2 months after your last dose. What side effects may I notice from receiving this medicine? Side effects that you should report to   your doctor or health care professional as soon as possible: -allergic reactions like skin rash, itching or hives, swelling of the face, lips, or tongue -breathing problems -changes in vision -fast, irregular heartbeat -feeling faint or lightheaded, falls -fever, chills -mouth sores -redness, blistering, peeling or  loosening of the skin, including inside the mouth -trouble passing urine or change in the amount of urine -unusually weak or tired Side effects that usually do not require medical attention (report to your doctor or health care professional if they continue or are bothersome): -changes in skin like acne, cracks, skin dryness -constipation -diarrhea -headache -nail changes -nausea, vomiting -stomach upset -weight loss This list may not describe all possible side effects. Call your doctor for medical advice about side effects. You may report side effects to FDA at 1-800-FDA-1088. Where should I keep my medicine? This drug is given in a hospital or clinic and will not be stored at home. NOTE: This sheet is a summary. It may not cover all possible information. If you have questions about this medicine, talk to your doctor, pharmacist, or health care provider.    2016, Elsevier/Gold Standard. (2014-12-19 22:27:08)  

## 2015-12-25 NOTE — Patient Instructions (Signed)

## 2015-12-25 NOTE — Assessment & Plan Note (Signed)
He has grade 1 skin toxicity from it. I recommend prescription hydrocortisone and topical antibiotic cream. I will reassess next week

## 2015-12-25 NOTE — Telephone Encounter (Signed)
per pof to sch pt appt-pt to gt updated copy b4 leaving trmt

## 2015-12-25 NOTE — Progress Notes (Signed)
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 Mild asymmetry of the left tonsil -- correlate with direct visualization to assess for primary neoplasm.   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   11/28/2015 PET scan 1. Left palatine tonsil primary with left worse than right cervical nodal metastasis. 2. No extracervical hypermetabolic metastasis.    12/12/2015 Procedure Peg and port-a-cath placement.   12/18/2015 -  Chemotherapy He received weekly cetuximab   12/18/2015 -  Radiation Therapy He received concurrent radiation    INTERVAL HISTORY: Please see below for problem oriented charting. He is seen prior to week 2 of treatment. Since last week, he developed mild mucositis, fatigue, diffuse skin rash affecting his head, face and chest and some headaches. He denies nausea or vomiting. No recent diarrhea. He has lost weight since I last saw him. He acknowledged have reduced oral intake since starting treatment  REVIEW OF SYSTEMS:   Constitutional: Denies fevers, chills  Eyes: Denies blurriness of vision Respiratory: Denies cough, dyspnea or wheezes Cardiovascular: Denies palpitation, chest discomfort or lower extremity swelling Gastrointestinal:  Denies nausea,  heartburn or change in bowel habits Lymphatics: Denies new lymphadenopathy or easy bruising Neurological:Denies numbness, tingling or new weaknesses Behavioral/Psych: Mood is stable, no new changes  All other systems were reviewed with the patient and are negative.  I have reviewed the past medical history, past surgical history, social history and family history with the patient and they are unchanged from previous note.  ALLERGIES:  has No Known Allergies.  MEDICATIONS:  Current Outpatient Prescriptions  Medication Sig Dispense Refill  . aspirin EC 81 MG tablet Take 81 mg by mouth daily.    Marland Kitchen ibuprofen (ADVIL,MOTRIN) 200 MG tablet Take 200 mg by mouth as needed.    Marland Kitchen levothyroxine (SYNTHROID, LEVOTHROID) 75 MCG tablet Take 75 mcg by mouth daily before breakfast.    . lidocaine (XYLOCAINE) 2 % solution Mix 1 part 2%viscous lidocaine,1part H2O.Swish and/or swallow 63mL of this mixture,70min before meals and at bedtime, up to QID 100 mL 5  . lidocaine-prilocaine (EMLA) cream Apply to affected area once 30 g 3  . losartan (COZAAR) 25 MG tablet Take 25 mg by mouth every morning. Reported on 12/23/2015  2  . ondansetron (ZOFRAN) 8 MG tablet Take 1 tablet (8 mg total) by mouth every 8 (eight) hours as needed for nausea. 30 tablet 3  . promethazine (PHENERGAN) 25 MG tablet Take 1 tablet (25 mg total) by mouth every 6 (six) hours as needed for nausea. 30 tablet 3  . sodium fluoride (FLUORISHIELD) 1.1 % GEL dental gel Instill one drop of gel per tooth space of fluoride tray. Place over teeth for 5 minutes. Remove. Spit out excess. Repeat nightly. 120 mL PRN  . clindamycin (CLINDAGEL) 1 % gel Apply topically 2 (two) times daily. 30 g 0  . hydrocortisone 1 % ointment  Apply 1 application topically 2 (two) times daily. 30 g 0  . oxyCODONE-acetaminophen (PERCOCET/ROXICET) 5-325 MG tablet Take 1 tablet by mouth every 4 (four) hours as needed for moderate pain. Reported on 12/25/2015    . sucralfate  (CARAFATE) 1 g tablet Dissolve 1 tablet in 20mL H2O and swallow up to QID,PRN sore throat. (Patient not taking: Reported on 12/25/2015) 60 tablet 5   No current facility-administered medications for this visit.   Facility-Administered Medications Ordered in Other Visits  Medication Dose Route Frequency Provider Last Rate Last Dose  . cetuximab (ERBITUX) chemo infusion 500 mg  250 mg/m2 (Treatment Plan Actual) Intravenous Once Heath Lark, MD 250 mL/hr at 12/25/15 1420 500 mg at 12/25/15 1420  . heparin lock flush 100 unit/mL  500 Units Intracatheter Once PRN Heath Lark, MD      . sodium chloride flush (NS) 0.9 % injection 10 mL  10 mL Intracatheter PRN Heath Lark, MD        PHYSICAL EXAMINATION: ECOG PERFORMANCE STATUS: 1 - Symptomatic but completely ambulatory  Filed Vitals:   12/25/15 1223  BP: 112/63  Pulse: 61  Temp: 97.6 F (36.4 C)  Resp: 18   Filed Weights   12/25/15 1223  Weight: 197 lb 6.4 oz (89.54 kg)    GENERAL:alert, no distress and comfortable SKIN:He has diffuse pustular skin rash on his skull, face and his chest. They were itchy. There were no evidence of excoriation or ulceration.  EYES: normal, Conjunctiva are pink and non-injected, sclera clear OROPHARYNX: Noticed grade 1 mucositis without ulceration. No oral thrush NECK: supple, thyroid normal size, non-tender, without nodularity LYMPH:  no palpable lymphadenopathy in the cervical, axillary or inguinal LUNGS: clear to auscultation and percussion with normal breathing effort HEART: regular rate & rhythm and no murmurs and no lower extremity edema ABDOMEN:abdomen soft, non-tender and normal bowel sounds. Feeding tube site looks okay Musculoskeletal:no cyanosis of digits and no clubbing  NEURO: alert & oriented x 3 with fluent speech, no focal motor/sensory deficits  LABORATORY DATA:  I have reviewed the data as listed    Component Value Date/Time   NA 136 12/25/2015 1155   NA 136 12/12/2015 1317   K 4.2  12/25/2015 1155   K 3.9 12/12/2015 1317   CL 103 12/12/2015 1317   CO2 25 12/25/2015 1155   CO2 24 12/12/2015 1317   GLUCOSE 96 12/25/2015 1155   GLUCOSE 91 12/12/2015 1317   BUN 14.6 12/25/2015 1155   BUN 17 12/12/2015 1317   CREATININE 1.0 12/25/2015 1155   CREATININE 1.11 12/12/2015 1317   CALCIUM 9.1 12/25/2015 1155   CALCIUM 9.0 12/12/2015 1317   PROT 7.1 12/25/2015 1155   PROT 7.0 09/24/2009 1331   ALBUMIN 3.3* 12/25/2015 1155   ALBUMIN 4.1 09/24/2009 1331   AST 14 12/25/2015 1155   AST 18 09/24/2009 1331   ALT 19 12/25/2015 1155   ALT 40 09/24/2009 1331   ALKPHOS 78 12/25/2015 1155   ALKPHOS 123* 09/24/2009 1331   BILITOT 0.45 12/25/2015 1155   BILITOT 0.3 09/24/2009 1331   GFRNONAA >60 12/12/2015 1317   GFRAA >60 12/12/2015 1317    No results found for: SPEP, UPEP  Lab Results  Component Value Date   WBC 9.4 12/25/2015   NEUTROABS 7.9* 12/25/2015   HGB 13.7 12/25/2015   HCT 41.3 12/25/2015   MCV 92.6 12/25/2015   PLT 211 12/25/2015      Chemistry      Component Value Date/Time   NA  136 12/25/2015 1155   NA 136 12/12/2015 1317   K 4.2 12/25/2015 1155   K 3.9 12/12/2015 1317   CL 103 12/12/2015 1317   CO2 25 12/25/2015 1155   CO2 24 12/12/2015 1317   BUN 14.6 12/25/2015 1155   BUN 17 12/12/2015 1317   CREATININE 1.0 12/25/2015 1155   CREATININE 1.11 12/12/2015 1317      Component Value Date/Time   CALCIUM 9.1 12/25/2015 1155   CALCIUM 9.0 12/12/2015 1317   ALKPHOS 78 12/25/2015 1155   ALKPHOS 123* 09/24/2009 1331   AST 14 12/25/2015 1155   AST 18 09/24/2009 1331   ALT 19 12/25/2015 1155   ALT 40 09/24/2009 1331   BILITOT 0.45 12/25/2015 1155   BILITOT 0.3 09/24/2009 1331      ASSESSMENT & PLAN:  Tonsil cancer ([place]) The patient developed mild side effects which is expected from recent treatment. He had grade 1 mucositis and grade 1 skin toxicity. I will continue treatment today without dose adjustment. I will continue to see him on a  weekly basis for supportive care.  Drug-induced skin rash He has grade 1 skin toxicity from it. I recommend prescription hydrocortisone and topical antibiotic cream. 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 He is prescribed Viscous Lidocaine and I recommend he start using it.  Protein-calorie malnutrition, mild ([place]) He has 5 pound weight loss and signs of protein calorie malnutrition. I recommend he increase oral intake and to start nutritional supplement. The patient has not started to use his feeding tube.. We will continue close monitoring of his weight in oral intake and he will continue close follow-up with dietitian during treatment.   No orders of the defined types were placed in this encounter.   All questions were answered. The patient knows to call the clinic with any problems, questions or concerns. No barriers to learning was detected. I spent 25 minutes counseling the patient face to face. The total time spent in the appointment was 40 minutes and more than 50% was on counseling and review of test results     [facility], [place], MD 12/25/2015 2:22 PM

## 2015-12-25 NOTE — Assessment & Plan Note (Signed)
This is mild, grade 1 from recent treatment. He is prescribed Viscous Lidocaine and I recommend he start using it.

## 2015-12-25 NOTE — Assessment & Plan Note (Signed)
He has 5 pound weight loss and signs of protein calorie malnutrition. I recommend he increase oral intake and to start nutritional supplement. The patient has not started to use his feeding tube.. We will continue close monitoring of his weight in oral intake and he will continue close follow-up with dietitian during treatment.

## 2015-12-25 NOTE — Assessment & Plan Note (Signed)
The patient developed mild side effects which is expected from recent treatment. He had grade 1 mucositis and grade 1 skin toxicity. I will continue treatment today without dose adjustment. I will continue to see him on a weekly basis for supportive care.

## 2015-12-26 ENCOUNTER — Ambulatory Visit
Admission: RE | Admit: 2015-12-26 | Discharge: 2015-12-26 | Disposition: A | Discharge status: Home / Self Care | Payer: 59 | Source: Ambulatory Visit | Attending: Radiation Oncology | Admitting: Radiation Oncology

## 2015-12-26 DIAGNOSIS — Z510 Encounter for antineoplastic radiation therapy: Secondary | ICD-10-CM | POA: Diagnosis not present

## 2015-12-27 ENCOUNTER — Ambulatory Visit
Admission: RE | Admit: 2015-12-27 | Discharge: 2015-12-27 | Disposition: A | Discharge status: Home / Self Care | Payer: 59 | Source: Ambulatory Visit | Attending: Radiation Oncology | Admitting: Radiation Oncology

## 2015-12-27 DIAGNOSIS — Z510 Encounter for antineoplastic radiation therapy: Secondary | ICD-10-CM | POA: Diagnosis not present

## 2015-12-27 NOTE — Progress Notes (Signed)
Mr. Plane came to nursing after his treatment today at the request of the therapists. He reports not feeling well, he is having a skin reaction to the chemotherapy he received this week which is painful. His vitals are as follows BP 127/71, HR 67, Temp 97.9 and oxygen level 100% on room air. He requested to go home without seeing Dr. Isidore Moos today. He reports he will call if he begins to feel worse or needs anything. Dr. Isidore Moos will see him on Monday for his weekly under treatment visit.

## 2015-12-30 ENCOUNTER — Ambulatory Visit: Payer: 59 | Attending: Radiation Oncology

## 2015-12-30 ENCOUNTER — Ambulatory Visit
Admission: RE | Admit: 2015-12-30 | Discharge: 2015-12-30 | Disposition: A | Discharge status: Home / Self Care | Payer: 59 | Source: Ambulatory Visit | Attending: Radiation Oncology | Admitting: Radiation Oncology

## 2015-12-30 ENCOUNTER — Encounter: Payer: Self-pay | Admitting: Radiation Oncology

## 2015-12-30 ENCOUNTER — Ambulatory Visit: Payer: 59

## 2015-12-30 VITALS — BP 102/69 | HR 85 | Temp 98.2°F | Ht 71.5 in | Wt 195.5 lb

## 2015-12-30 DIAGNOSIS — R1310 Dysphagia, unspecified: Secondary | ICD-10-CM | POA: Insufficient documentation

## 2015-12-30 DIAGNOSIS — C090 Malignant neoplasm of tonsillar fossa: Secondary | ICD-10-CM

## 2015-12-30 DIAGNOSIS — Z510 Encounter for antineoplastic radiation therapy: Secondary | ICD-10-CM | POA: Diagnosis not present

## 2015-12-30 NOTE — Progress Notes (Signed)
Gregory Jenkins is here for his 10th fraction of radiation to his Left Tonsil. He has had a reaction to the Erbitux chemotherapy he received. He has a rash to his body which is concentrated on his trunk and scalp. He is very sensitive to touch and tender.  He is using hydrocortizone and Clindamycin cream to these areas. He rates his pain a 5/10. He reports he is swallowing well. He is not eating much because he feels full quickly. He is eating more frequent smaller meals. He has an appointment to see nutrition on Wednesday of this week. They are not using the PEG tube at this point. He reports feeling full, and reflux with the flushes. His mouth is moist, with thick saliva present. He is using the carafate and baking soda rinses which help. His neck is hyperpigmented and he is using the sonafine cream twice daily.  BP 102/69 mmHg  Pulse 85  Temp(Src) 98.2 F (36.8 C)  Ht 5' 11.5" (1.816 m)  Wt 195 lb 8 oz (88.678 kg)  BMI 26.89 kg/m2  SpO2 100%   Wt Readings from Last 3 Encounters:  12/30/15 195 lb 8 oz (88.678 kg)  12/25/15 197 lb 6.4 oz (89.54 kg)  12/23/15 198 lb 8 oz (90.039 kg)

## 2015-12-30 NOTE — Progress Notes (Signed)
Weekly Management Note:  Outpatient    ICD-9-CM ICD-10-CM   1. 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  CBCT/MVCT images/Port film x-rays were reviewed.  The chart was checked.  Mr. Gregory Jenkins is here for his 10th fraction of radiation to his Left Tonsil. He has had a reaction to the Erbitux chemotherapy he received. He has a rash to his body which is concentrated on his trunk and scalp. He is very sensitive to touch and tender.  He is using hydrocortizone and Clindamycin cream to these areas. He rates his pain a 5/10. He reports he is swallowing well. He is not eating much because he feels full quickly. He is eating more frequent smaller meals. He has an appointment to see nutrition on Wednesday of this week. They are not using the PEG tube at this point. He reports feeling full, and reflux with the flushes. His mouth is moist, with thick saliva present. He is using the carafate and baking soda rinses which help. His neck is hyperpigmented and he is using the sonafine cream twice daily.  Physical Findings:  Wt Readings from Last 3 Encounters:  12/30/15 195 lb 8 oz (88.678 kg)  12/25/15 197 lb 6.4 oz (89.54 kg)  12/23/15 198 lb 8 oz (90.039 kg)    height is 5' 11.5" (1.816 m) and weight is 195 lb 8 oz (88.678 kg). His temperature is 98.2 F (36.8 C). His blood pressure is 102/69 and his pulse is 85. His oxygen saturation is 100%.   Skin erythematous and with acneiform rash. erythema in oropharynx. No thrush.    CBC    Component Value Date/Time   WBC 9.4 12/25/2015 1155   WBC 6.8 12/12/2015 1317   RBC 4.46 12/25/2015 1155   RBC 4.63 12/12/2015 1317   HGB 13.7 12/25/2015 1155   HGB 14.4 12/12/2015 1317   HCT 41.3 12/25/2015 1155   HCT 43.5 12/12/2015 1317   PLT 211 12/25/2015 1155   PLT 213 12/12/2015 1317   MCV 92.6 12/25/2015 1155   MCV 94.0 12/12/2015  1317   MCH 30.8 12/25/2015 1155   MCH 31.1 12/12/2015 1317   MCHC 33.2 12/25/2015 1155   MCHC 33.1 12/12/2015 1317   RDW 13.2 12/25/2015 1155   RDW 12.9 12/12/2015 1317   LYMPHSABS 0.6* 12/25/2015 1155   LYMPHSABS 1.6 12/12/2015 1317   MONOABS 0.8 12/25/2015 1155   MONOABS 0.5 12/12/2015 1317   EOSABS 0.1 12/25/2015 1155   EOSABS 0.1 12/12/2015 1317   BASOSABS 0.0 12/25/2015 1155   BASOSABS 0.0 12/12/2015 1317     CMP     Component Value Date/Time   NA 136 12/25/2015 1155   NA 136 12/12/2015 1317   K 4.2 12/25/2015 1155   K 3.9 12/12/2015 1317   CL 103 12/12/2015 1317   CO2 25 12/25/2015 1155   CO2 24 12/12/2015 1317   GLUCOSE 96 12/25/2015 1155   GLUCOSE 91 12/12/2015 1317   BUN 14.6 12/25/2015 1155   BUN 17 12/12/2015 1317   CREATININE 1.0 12/25/2015 1155   CREATININE 1.11 12/12/2015 1317   CALCIUM 9.1 12/25/2015 1155   CALCIUM 9.0 12/12/2015 1317   PROT 7.1 12/25/2015 1155   PROT 7.0 09/24/2009 1331   ALBUMIN 3.3* 12/25/2015 1155   ALBUMIN 4.1 09/24/2009 1331   AST 14 12/25/2015 1155  AST 18 09/24/2009 1331   ALT 19 12/25/2015 1155   ALT 40 09/24/2009 1331   ALKPHOS 78 12/25/2015 1155   ALKPHOS 123* 09/24/2009 1331   BILITOT 0.45 12/25/2015 1155   BILITOT 0.3 09/24/2009 1331   GFRNONAA >60 12/12/2015 1317   GFRAA >60 12/12/2015 1317     Impression:  The patient is tolerating radiotherapy.   Plan:  Continue radiotherapy as planned. Percocet left over from PEG for pain.  Discuss pain management for long term with med/onc tomorrow.  Sucralfate and Lidocaine for odynophagia PRN. Cncouragement given.  -----------------------------------  Eppie Gibson, MD

## 2015-12-30 NOTE — Therapy (Signed)
[facility] [address] [place], [place], [address] Phone: [phone]   Fax:  [phone]  Speech Language Pathology Treatment  Patient Details  Name: Gregory Jenkins MRN: [id number] Date of Birth: 1950/12/09 Referring Provider: Eppie Jenkins, M.D.  Encounter Date: 12/30/2015      End of Session - 12/30/15 1452    Visit Number 2   Number of Visits 7   Date for SLP Re-Evaluation 06/08/16   SLP Start Time [id number]   SLP Stop Time  1429  pt was satisfied with performance   SLP Time Calculation (min) 27 min   Activity Tolerance Patient tolerated treatment well      Past Medical History  Diagnosis Date  . Allergic rhinitis   . Hypertension   . Thyroid disease   . Clotting disorder (HCC)     PE, suspect lupus anticoagulant  . History of pulmonary embolism   . History of nephrolithiasis   . Drug-induced skin rash 12/25/2015    Past Surgical History  Procedure Laterality Date  . Eye surgery Right 1980's    H/O right orbital "blowout" fracture  . Foot surgery Left     [facility] Neuroma  . Knee surgery Right     Arthroscopic    There were no vitals filed for this visit.  Visit Diagnosis: Dysphagia      Subjective Assessment - 12/30/15 1359    Subjective Pt had his 10th fraction today.               ADULT SLP TREATMENT - 12/30/15 1405    General Information   Behavior/Cognition Alert;Cooperative   Treatment Provided   Treatment provided Dysphagia   Dysphagia Treatment   Temperature Spikes Noted No   Respiratory Status Room air   Treatment Methods Therapeutic exercise;Skilled observation   Patient observed directly with PO's Yes   Type of PO's observed Thin liquids;Dysphagia 3 (soft)   Liquids provided via Cup   Oral Phase Signs & Symptoms --  none observed   Pharyngeal Phase Signs & Symptoms --  none observed   Other treatment/comments Pt reports performing exercises twice daily- piecemeal. He  req'd min-mod cues for telling SLP why he is performing HEP so SLP re-educated pt. With HEP pt req'd min A occasionally (hold breath with vocal adduction, tongue protrusion on [place]). Pt told SLP 3 s/s aspiration PNA with modified independence after SLP explained the handout.   Pain Assessment   Pain Assessment 0-10   Pain Score 5    Pain Location neck   Pain Descriptors / Indicators Tender   Pain Intervention(s) Monitored during session   Assessment / Recommendations / Plan   Plan Continue with current plan of care   Dysphagia Recommendations   Diet recommendations --  as tolerated   Liquids provided via Cup   Progression Toward Goals   Progression toward goals Progressing toward goals          SLP Education - 12/30/15 1451    Education provided Yes   Education Details rationale for doing HEP, HEP   Person(s) Educated Patient   Methods Explanation   Comprehension Verbalized understanding          SLP Short Term Goals - 12/30/15 1453    SLP SHORT TERM GOAL #1   Title pt will perform HEP with rare min A   Time 2   Period --  visits   Status On-going   SLP SHORT TERM GOAL #2   Title  pt will tell SLP why he is completing HEP   Time 2   Period --  visits   Status On-going   SLP SHORT TERM GOAL #3   Title pt will tell SLP 3 s/s aspiration PNA with modified independence   Period --  visits   Status Achieved          SLP Long Term Goals - 12/30/15 1454    SLP LONG TERM GOAL #1   Title pt will perform HEP with modified independence over two sessions   Time 4   Period --  visits   Status On-going   SLP LONG TERM GOAL #2   Title pt will tell SLP how a food journal can expediate return to regular diet following chemo/rad tx   Time 4   Period --  visits   Status On-going   SLP LONG TERM GOAL #3   Title pt will maintain performance of HEP independently over three sessions   Time 6   Period --  visits   Status On-going   SLP LONG TERM GOAL #4   Title pt  will tell SLP date he modifies HEP frequency to 2-3x/week over two sessions   Time 7   Period --  visits   Status On-going          Plan - 12/30/15 1452    Clinical Impression Statement Pt presents with cont swallowing as essentially WNL, however risk of dysphagia remains as pt undergoes chemo/rad. SLP had to cue pt for performance of HEP occasionally. Skilled ST necessary to cont to assess pt safety with POs as well as assess procedure with HEP.    Speech Therapy Frequency --  approx every four weeks   Duration --  5 visits   Treatment/Interventions Aspiration precaution training;Pharyngeal strengthening exercises;Compensatory techniques;Diet toleration management by SLP;Trials of upgraded texture/liquids;Patient/family education;Compensatory strategies;SLP instruction and feedback   Potential to Achieve Goals Good   SLP Home Exercise Plan provided today   Consulted and Agree with Plan of Care Patient        Problem List Patient Active Problem List   Diagnosis Date Noted  . Drug-induced skin rash 12/25/2015  . 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 Protein-calorie malnutrition, mild ([place]) 12/25/2015  . Drug-induced hyperkalemia 12/10/2015  . 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 [place], [place], [address] Phone: [phone]   Fax:  [phone]   Name: Gregory Jenkins MRN: [id number] Date of Birth: [birth year]

## 2015-12-30 NOTE — Patient Instructions (Signed)
Continue to do the exercises.  Remember that the more you swallow, the less chance you have of the muscle hardening after radiation is done.

## 2015-12-31 ENCOUNTER — Other Ambulatory Visit (HOSPITAL_BASED_OUTPATIENT_CLINIC_OR_DEPARTMENT_OTHER): Payer: 59

## 2015-12-31 ENCOUNTER — Encounter (HOSPITAL_COMMUNITY): Payer: Self-pay | Admitting: Dentistry

## 2015-12-31 ENCOUNTER — Ambulatory Visit
Admission: RE | Admit: 2015-12-31 | Discharge: 2015-12-31 | Disposition: A | Discharge status: Home / Self Care | Payer: 59 | Source: Ambulatory Visit | Attending: Radiation Oncology | Admitting: Radiation Oncology

## 2015-12-31 ENCOUNTER — Ambulatory Visit (HOSPITAL_BASED_OUTPATIENT_CLINIC_OR_DEPARTMENT_OTHER): Payer: 59 | Admitting: Hematology and Oncology

## 2015-12-31 ENCOUNTER — Ambulatory Visit (HOSPITAL_COMMUNITY): Payer: Medicaid - Dental | Admitting: Dentistry

## 2015-12-31 ENCOUNTER — Encounter: Payer: Self-pay | Admitting: Hematology and Oncology

## 2015-12-31 VITALS — BP 120/68 | HR 72 | Temp 98.7°F | Resp 18 | Wt 198.0 lb

## 2015-12-31 VITALS — BP 118/68 | HR 68 | Temp 98.0°F | Wt 198.0 lb

## 2015-12-31 DIAGNOSIS — E441 Mild protein-calorie malnutrition: Secondary | ICD-10-CM

## 2015-12-31 DIAGNOSIS — L270 Generalized skin eruption due to drugs and medicaments taken internally: Secondary | ICD-10-CM

## 2015-12-31 DIAGNOSIS — C099 Malignant neoplasm of tonsil, unspecified: Secondary | ICD-10-CM | POA: Diagnosis not present

## 2015-12-31 DIAGNOSIS — K9429 Other complications of gastrostomy: Principal | ICD-10-CM

## 2015-12-31 DIAGNOSIS — R432 Parageusia: Secondary | ICD-10-CM

## 2015-12-31 DIAGNOSIS — R1310 Dysphagia, unspecified: Secondary | ICD-10-CM

## 2015-12-31 DIAGNOSIS — K1230 Oral mucositis (ulcerative), unspecified: Secondary | ICD-10-CM | POA: Diagnosis not present

## 2015-12-31 DIAGNOSIS — R682 Dry mouth, unspecified: Secondary | ICD-10-CM

## 2015-12-31 DIAGNOSIS — K1379 Other lesions of oral mucosa: Secondary | ICD-10-CM | POA: Diagnosis not present

## 2015-12-31 DIAGNOSIS — K9281 Gastrointestinal mucositis (ulcerative): Secondary | ICD-10-CM

## 2015-12-31 DIAGNOSIS — Z0189 Encounter for other specified special examinations: Secondary | ICD-10-CM

## 2015-12-31 DIAGNOSIS — K117 Disturbances of salivary secretion: Secondary | ICD-10-CM

## 2015-12-31 DIAGNOSIS — Z510 Encounter for antineoplastic radiation therapy: Secondary | ICD-10-CM | POA: Diagnosis not present

## 2015-12-31 LAB — COMPREHENSIVE METABOLIC PANEL
ALT: 17 U/L (ref 0–55)
ANION GAP: 8 meq/L (ref 3–11)
AST: 14 U/L (ref 5–34)
Albumin: 3.4 g/dL — ABNORMAL LOW (ref 3.5–5.0)
Alkaline Phosphatase: 88 U/L (ref 40–150)
BILIRUBIN TOTAL: 0.53 mg/dL (ref 0.20–1.20)
BUN: 11.2 mg/dL (ref 7.0–26.0)
CALCIUM: 9.9 mg/dL (ref 8.4–10.4)
CHLORIDE: 100 meq/L (ref 98–109)
CO2: 29 meq/L (ref 22–29)
Creatinine: 1 mg/dL (ref 0.7–1.3)
EGFR: 76 mL/min/{1.73_m2} — AB (ref >90)
Glucose: 108 mg/dl (ref 70–140)
Potassium: 4.5 mEq/L (ref 3.5–5.1)
SODIUM: 137 meq/L (ref 136–145)
TOTAL PROTEIN: 7.5 g/dL (ref 6.4–8.3)

## 2015-12-31 LAB — MAGNESIUM: MAGNESIUM: 2.3 mg/dL (ref 1.5–2.5)

## 2015-12-31 LAB — CBC WITH DIFFERENTIAL/PLATELET
BASO%: 0.3 % (ref 0.0–2.0)
Basophils Absolute: 0 10*3/uL (ref 0.0–0.1)
EOS%: 1.9 % (ref 0.0–7.0)
Eosinophils Absolute: 0.1 10*3/uL (ref 0.0–0.5)
HCT: 42.9 % (ref 38.4–49.9)
HGB: 14.3 g/dL (ref 13.0–17.1)
LYMPH%: 4.3 % — AB (ref 14.0–49.0)
MCH: 31.1 pg (ref 27.2–33.4)
MCHC: 33.3 g/dL (ref 32.0–36.0)
MCV: 93.4 fL (ref 79.3–98.0)
MONO#: 0.8 10*3/uL (ref 0.1–0.9)
MONO%: 11.2 % (ref 0.0–14.0)
NEUT%: 82.3 % — ABNORMAL HIGH (ref 39.0–75.0)
NEUTROS ABS: 6.2 10*3/uL (ref 1.5–6.5)
PLATELETS: 229 10*3/uL (ref 140–400)
RBC: 4.59 10*6/uL (ref 4.20–5.82)
RDW: 13.2 % (ref 11.0–14.6)
WBC: 7.5 10*3/uL (ref 4.0–10.3)
lymph#: 0.3 10*3/uL — ABNORMAL LOW (ref 0.9–3.3)

## 2015-12-31 MED ORDER — OXYCODONE-ACETAMINOPHEN 5-325 MG PO TABS: 1.0000 | ORAL_TABLET | ORAL | Status: DC | PRN

## 2015-12-31 NOTE — Patient Instructions (Addendum)
RECOMMENDATIONS: 1. Brush after meals and at bedtime.  Use fluoride at bedtime. 2. Use trismus exercises as directed. 3. Use Biotene Rinse or salt water/baking soda rinses. 4. Multiple sips of water as needed. 5. Return to clinic in two months for oral exam after radiation therapy. 6. Call if adjustment to scatter guard on the lower is needed. Call if problems before then.  Lenn Cal, DDS

## 2015-12-31 NOTE — Progress Notes (Signed)
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 Mild asymmetry of the left tonsil -- correlate with direct visualization to assess for primary neoplasm.   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   11/28/2015 PET scan 1. Left palatine tonsil primary with left worse than right cervical nodal metastasis. 2. No extracervical hypermetabolic metastasis.    12/12/2015 Procedure Peg and port-a-cath placement.   12/18/2015 -  Chemotherapy He received weekly cetuximab   12/18/2015 -  Radiation Therapy He received concurrent radiation   12/31/2015 Adverse Reaction Treatment #3 is placed on hold due to worsening mucositis    INTERVAL HISTORY: Please see below for problem oriented charting. He is seen today prior to cycle 3 of treatment. He has worsening mild source and skin rashes and overall felt miserable. He has started to use Percocet on a regular basis for mouth pain. He has persistent altered taste sensation with reduced oral intake  REVIEW OF SYSTEMS:   Constitutional: Denies fevers, chills or abnormal weight loss Eyes: Denies blurriness of vision Respiratory: Denies cough, dyspnea or wheezes Cardiovascular: Denies palpitation, chest discomfort or lower extremity  swelling Gastrointestinal:  Denies nausea, heartburn or change in bowel habits Lymphatics: Denies new lymphadenopathy or easy bruising Neurological:Denies numbness, tingling or new weaknesses Behavioral/Psych: Mood is stable, no new changes  All other systems were reviewed with the patient and are negative.  I have reviewed the past medical history, past surgical history, social history and family history with the patient and they are unchanged from previous note.  ALLERGIES:  has No Known Allergies.  MEDICATIONS:  Current Outpatient Prescriptions  Medication Sig Dispense Refill  . aspirin EC 81 MG tablet Take 81 mg by mouth daily.    . clindamycin (CLINDAGEL) 1 % gel Apply topically 2 (two) times daily. 30 g 0  . hydrocortisone 1 % ointment Apply 1 application topically 2 (two) times daily. 30 g 0  . ibuprofen (ADVIL,MOTRIN) 200 MG tablet Take 200 mg by mouth as needed.    [place] levothyroxine (SYNTHROID, LEVOTHROID) 75 MCG tablet Take 75 mcg by mouth daily before breakfast.    . lidocaine (XYLOCAINE) 2 % solution Mix 1 part 2%viscous lidocaine,1part H2O.Swish and/or swallow 70mL of this mixture,31min before meals and at bedtime, up to QID 100 mL 5  . lidocaine-prilocaine (EMLA) cream Apply to affected area once 30 g 3  . ondansetron (ZOFRAN) 8 MG tablet Take 1 tablet (8 mg total) by mouth every 8 (eight) hours as needed for nausea. 30 tablet 3  . oxyCODONE-acetaminophen (PERCOCET/ROXICET) 5-325 MG tablet Take 1 tablet by mouth every 4 (four) hours as needed for moderate pain. Reported on 12/25/2015 60 tablet 0  . sodium fluoride (FLUORISHIELD) 1.1 % GEL dental gel Instill one drop of gel per tooth space of fluoride tray. Place over teeth for 5 minutes. Remove. Spit out excess. Repeat nightly. 120 mL  PRN  . sucralfate (CARAFATE) 1 g tablet Dissolve 1 tablet in 48mL H2O and swallow up to QID,PRN sore throat. 60 tablet 5  . losartan (COZAAR) 25 MG tablet Take 25 mg by mouth every morning. Reported  on 12/31/2015  2  . promethazine (PHENERGAN) 25 MG tablet Take 1 tablet (25 mg total) by mouth every 6 (six) hours as needed for nausea. (Patient not taking: Reported on 12/31/2015) 30 tablet 3   No current facility-administered medications for this visit.    PHYSICAL EXAMINATION: ECOG PERFORMANCE STATUS: 1 - Symptomatic but completely ambulatory  Filed Vitals:   12/31/15 1015  BP: 120/68  Pulse: 72  Temp: 98.7 F (37.1 C)  Resp: 18   Filed Weights   12/31/15 1015  Weight: 198 lb (89.812 kg)    GENERAL:alert, no distress and comfortable SKIN: Noted mild worsening grade 1-2 skin toxicity from cetuximab EYES: normal, Conjunctiva are pink and non-injected, sclera clear OROPHARYNX: Noted mild source. No ulceration or thrush  NECK: supple, thyroid normal size, non-tender, without nodularity NEURO: alert & oriented x 3 with fluent speech, no focal motor/sensory deficits  LABORATORY DATA:  I have reviewed the data as listed    Component Value Date/Time   NA 137 12/31/2015 1002   NA 136 12/12/2015 1317   K 4.5 12/31/2015 1002   K 3.9 12/12/2015 1317   CL 103 12/12/2015 1317   CO2 29 12/31/2015 1002   CO2 24 12/12/2015 1317   GLUCOSE 108 12/31/2015 1002   GLUCOSE 91 12/12/2015 1317   BUN 11.2 12/31/2015 1002   BUN 17 12/12/2015 1317   CREATININE 1.0 12/31/2015 1002   CREATININE 1.11 12/12/2015 1317   CALCIUM 9.9 12/31/2015 1002   CALCIUM 9.0 12/12/2015 1317   PROT 7.5 12/31/2015 1002   PROT 7.0 09/24/2009 1331   ALBUMIN 3.4* 12/31/2015 1002   ALBUMIN 4.1 09/24/2009 1331   AST 14 12/31/2015 1002   AST 18 09/24/2009 1331   ALT 17 12/31/2015 1002   ALT 40 09/24/2009 1331   ALKPHOS 88 12/31/2015 1002   ALKPHOS 123* 09/24/2009 1331   BILITOT 0.53 12/31/2015 1002   BILITOT 0.3 09/24/2009 1331   GFRNONAA >60 12/12/2015 1317   GFRAA >60 12/12/2015 1317    No results found for: SPEP, UPEP  Lab Results  Component Value Date   WBC 7.5 12/31/2015   NEUTROABS 6.2 12/31/2015    HGB 14.3 12/31/2015   HCT 42.9 12/31/2015   MCV 93.4 12/31/2015   PLT 229 12/31/2015      Chemistry      Component Value Date/Time   NA 137 12/31/2015 1002   NA 136 12/12/2015 1317   K 4.5 12/31/2015 1002   K 3.9 12/12/2015 1317   CL 103 12/12/2015 1317   CO2 29 12/31/2015 1002   CO2 24 12/12/2015 1317   BUN 11.2 12/31/2015 1002   BUN 17 12/12/2015 1317   CREATININE 1.0 12/31/2015 1002   CREATININE 1.11 12/12/2015 1317      Component Value Date/Time   CALCIUM 9.9 12/31/2015 1002   CALCIUM 9.0 12/12/2015 1317   ALKPHOS 88 12/31/2015 1002   ALKPHOS 123* 09/24/2009 1331   AST 14 12/31/2015 1002   AST 18 09/24/2009 1331   ALT 17 12/31/2015 1002   ALT 40 09/24/2009 1331   BILITOT 0.53 12/31/2015 1002   BILITOT 0.3 09/24/2009 1331      ASSESSMENT & PLAN:  Tonsil cancer ([place]) He has worsening mucositis and skin rash. The patient is  miserable. I do not want to compromise his ability to complete radiation therapy in time. I recommend holding treatment this week and reassess next week. He agreed with the plan of care.  Stomal mucositis (HCC) He has worsening mucositis, impairing his ability to get adequate oral intake. I refill his prescription of pain medicine and recommend he takes as needed Xylocaine and the Carafate that were prescribed to him.   Drug-induced skin rash He has grade 1 skin toxicity from it. I recommend he continues prescription hydrocortisone and topical antibiotic cream. I will reassess next week  Protein-calorie malnutrition, mild ([place]) He has recent weight loss and signs of protein calorie malnutrition. I recommend he increase oral intake and to start nutritional supplement via the feeding tube We will continue close monitoring of his weight in oral intake and he will continue close follow-up with dietitian during treatment.   No orders of the defined types were placed in this encounter.   All questions were answered. The patient knows to  call the clinic with any problems, questions or concerns. No barriers to learning was detected. I spent 15 minutes counseling the patient face to face. The total time spent in the appointment was 20 minutes and more than 50% was on counseling and review of test results     [facility], [place], MD 12/31/2015 10:56 AM

## 2015-12-31 NOTE — Progress Notes (Signed)
12/31/2015  Patient Name:   Gregory Jenkins Date of Birth:   04-17-1951 Medical Record Number: [id number]  BP 118/68 mmHg  Pulse 68  Temp(Src) 98 F (36.7 C) (Oral)  Wt 198 lb (89.812 kg)  [name] presents for oral examination during chemoradiation therapy. Patient has completed 11/35 radiation treatments. Patient has had 2 Erbitux chemotherapy treatments.   REVIEW OF CHIEF COMPLAINTS:  DRY MOUTH: Yes HARD TO SWALLOW: Yes, at times.  HURT TO SWALLOW: Yes, at times. TASTE CHANGES: Yes  SORES IN MOUTH: Yes TRISMUS: Patient denies trismus symptoms WEIGHT: 198 lbs  HOME OH REGIMEN:  BRUSHING: Twice a day FLOSSING: Every other day RINSING: Salt water and baking soda and Biotene rinses FLUORIDE: Using bedtime without problems.  TRISMUS EXERCISES:  Maximum interincisal opening: 45 mm with no problems with trismus exercises.  DENTAL EXAM:  Oral Hygiene:(PLAQUE): Relatively good oral hygiene. LOCATION OF MUCOSITIS: Back of throat,  left lateral tongue,  corners of the mouth, lower inner lip ulcerations. DESCRIPTION OF SALIVA: Decreased saliva. Incipient xerostomia. ANY EXPOSED BONE: None noted OTHER WATCHED AREAS: None DX: Xerostomia, Dysgeusia, Dysphagia, Odynophagia and Mucositis  RECOMMENDATIONS: 1. Brush after meals and at bedtime.  Use fluoride at bedtime. 2. Use trismus exercises as directed. 3. Use Biotene Rinse or salt water/baking soda rinses. 4. Multiple sips of water as needed. 5. Return to clinic in two months for oral exam after radiation therapy. 6. Call if adjustment to scatter guard on the lower is needed. Call if problems before then.  Lenn Cal, DDS

## 2015-12-31 NOTE — Assessment & Plan Note (Signed)
He has worsening mucositis, impairing his ability to get adequate oral intake. I refill his prescription of pain medicine and recommend he takes as needed Xylocaine and the Carafate that were prescribed to him.

## 2015-12-31 NOTE — Assessment & Plan Note (Signed)
He has worsening mucositis and skin rash. The patient is miserable. I do not want to compromise his ability to complete radiation therapy in time. I recommend holding treatment this week and reassess next week. He agreed with the plan of care.

## 2015-12-31 NOTE — Assessment & Plan Note (Signed)
He has recent weight loss and signs of protein calorie malnutrition. I recommend he increase oral intake and to start nutritional supplement via the feeding tube We will continue close monitoring of his weight in oral intake and he will continue close follow-up with dietitian during treatment.

## 2015-12-31 NOTE — Assessment & Plan Note (Signed)
He has grade 1 skin toxicity from it. I recommend he continues prescription hydrocortisone and topical antibiotic cream. I will reassess next week

## 2016-01-01 ENCOUNTER — Encounter: Payer: Self-pay | Admitting: Nutrition

## 2016-01-01 ENCOUNTER — Ambulatory Visit
Admission: RE | Admit: 2016-01-01 | Discharge: 2016-01-01 | Disposition: A | Discharge status: Home / Self Care | Payer: 59 | Source: Ambulatory Visit | Attending: Radiation Oncology | Admitting: Radiation Oncology

## 2016-01-01 ENCOUNTER — Ambulatory Visit: Payer: Self-pay

## 2016-01-01 ENCOUNTER — Encounter: Payer: Self-pay | Admitting: *Deleted

## 2016-01-01 DIAGNOSIS — Z510 Encounter for antineoplastic radiation therapy: Secondary | ICD-10-CM | POA: Diagnosis not present

## 2016-01-02 ENCOUNTER — Ambulatory Visit
Admission: RE | Admit: 2016-01-02 | Discharge: 2016-01-02 | Disposition: A | Discharge status: Home / Self Care | Payer: 59 | Source: Ambulatory Visit | Attending: Radiation Oncology | Admitting: Radiation Oncology

## 2016-01-02 DIAGNOSIS — Z510 Encounter for antineoplastic radiation therapy: Secondary | ICD-10-CM | POA: Diagnosis not present

## 2016-01-03 ENCOUNTER — Ambulatory Visit
Admission: RE | Admit: 2016-01-03 | Discharge: 2016-01-03 | Disposition: A | Discharge status: Home / Self Care | Payer: 59 | Source: Ambulatory Visit | Attending: Radiation Oncology | Admitting: Radiation Oncology

## 2016-01-03 DIAGNOSIS — Z510 Encounter for antineoplastic radiation therapy: Secondary | ICD-10-CM | POA: Diagnosis not present

## 2016-01-06 ENCOUNTER — Ambulatory Visit
Admission: RE | Admit: 2016-01-06 | Discharge: 2016-01-06 | Disposition: A | Discharge status: Home / Self Care | Payer: 59 | Source: Ambulatory Visit | Attending: Radiation Oncology | Admitting: Radiation Oncology

## 2016-01-06 ENCOUNTER — Encounter: Payer: Self-pay | Admitting: Radiation Oncology

## 2016-01-06 ENCOUNTER — Encounter: Payer: Self-pay | Admitting: *Deleted

## 2016-01-06 VITALS — BP 110/70 | HR 77 | Temp 98.4°F | Ht 71.5 in | Wt 190.5 lb

## 2016-01-06 DIAGNOSIS — C090 Malignant neoplasm of tonsillar fossa: Secondary | ICD-10-CM

## 2016-01-06 DIAGNOSIS — Z510 Encounter for antineoplastic radiation therapy: Secondary | ICD-10-CM | POA: Diagnosis not present

## 2016-01-06 NOTE — Progress Notes (Addendum)
Weekly Management Note:  Outpatient    ICD-9-CM ICD-10-CM   1. Carcinoma of tonsillar fossa (HCC) 146.1 C09.0     Current Dose:  30 Gy  Projected Dose: 70 Gy   Narrative:  The patient presents for routine under treatment assessment.  CBCT/MVCT images/Port film x-rays were reviewed.  The chart was checked.   Mr. Gregory Jenkins is here for his 15th fraction of radiation to his Left Tonsil and bilateral neck. He reports pain when swallowing and is taking oxycodone to relieve this pain. During the week he is taking 4-5 pills, and on the weekend 2-3 pills. He is using the lidocaine and carafate rinses during the day. He has not taken anything by mouth since Saturday. He is using the PEG tube exclusively now. Sunday he instilled 2 cans of Ensure plus. They would like to speak to nutrition regarding the amount of Tube feeding he should be instilling. He is drinking water orally about 2-3 seventeen ounce water each day, and some vitamin water. He reports his mouth is dry and has thick saliva present. His mouth is red with white patches present. His skin to his neck is red and he is using the sonafine cream as directed. The generalized rash to his body has improved, and he will receive chemotherapy on Wednesday.    Othostatics: BP sitting 112/64 pulse 66 and BP standing 110/70 pulse 77.    Physical Findings:  Wt Readings from Last 3 Encounters:  01/06/16 190 lb 8 oz (86.41 kg)  12/31/15 198 lb (89.812 kg)  12/31/15 198 lb (89.812 kg)    height is 5' 11.5" (1.816 m) and weight is 190 lb 8 oz (86.41 kg). His temperature is 98.4 F (36.9 C). His blood pressure is 110/70 and his pulse is 77. His oxygen saturation is 99%.   Skin erythematous and with acneiform rash. erythema in oropharynx with patchy mucositis. No thrush.    CBC    Component Value Date/Time   WBC 7.5 12/31/2015 1002   WBC 6.8 12/12/2015 1317   RBC 4.59 12/31/2015 1002   RBC 4.63 12/12/2015 1317   HGB 14.3 12/31/2015 1002   HGB 14.4  12/12/2015 1317   HCT 42.9 12/31/2015 1002   HCT 43.5 12/12/2015 1317   PLT 229 12/31/2015 1002   PLT 213 12/12/2015 1317   MCV 93.4 12/31/2015 1002   MCV 94.0 12/12/2015 1317   MCH 31.1 12/31/2015 1002   MCH 31.1 12/12/2015 1317   MCHC 33.3 12/31/2015 1002   MCHC 33.1 12/12/2015 1317   RDW 13.2 12/31/2015 1002   RDW 12.9 12/12/2015 1317   LYMPHSABS 0.3* 12/31/2015 1002   LYMPHSABS 1.6 12/12/2015 1317   MONOABS 0.8 12/31/2015 1002   MONOABS 0.5 12/12/2015 1317   EOSABS 0.1 12/31/2015 1002   EOSABS 0.1 12/12/2015 1317   BASOSABS 0.0 12/31/2015 1002   BASOSABS 0.0 12/12/2015 1317     CMP     Component Value Date/Time   NA 137 12/31/2015 1002   NA 136 12/12/2015 1317   K 4.5 12/31/2015 1002   K 3.9 12/12/2015 1317   CL 103 12/12/2015 1317   CO2 29 12/31/2015 1002   CO2 24 12/12/2015 1317   GLUCOSE 108 12/31/2015 1002   GLUCOSE 91 12/12/2015 1317   BUN 11.2 12/31/2015 1002   BUN 17 12/12/2015 1317   CREATININE 1.0 12/31/2015 1002   CREATININE 1.11 12/12/2015 1317   CALCIUM 9.9 12/31/2015 1002   CALCIUM 9.0 12/12/2015 1317   PROT 7.5  12/31/2015 1002   PROT 7.0 09/24/2009 1331   ALBUMIN 3.4* 12/31/2015 1002   ALBUMIN 4.1 09/24/2009 1331   AST 14 12/31/2015 1002   AST 18 09/24/2009 1331   ALT 17 12/31/2015 1002   ALT 40 09/24/2009 1331   ALKPHOS 88 12/31/2015 1002   ALKPHOS 123* 09/24/2009 1331   BILITOT 0.53 12/31/2015 1002   BILITOT 0.3 09/24/2009 1331   GFRNONAA >60 12/12/2015 1317   GFRAA >60 12/12/2015 1317    Impression:  The patient is tolerating radiotherapy.   Plan:  Continue radiotherapy as planned. I will ask Dory Peru if continual tube feeds are possible for patient.  He is encouraged to take pain meds around the clock if needed. Consider long acting pain meds w/ med/onc this week.  Discussed scopolamine for thick saliva. He declines.  -----------------------------------  Eppie Gibson, MD

## 2016-01-06 NOTE — Progress Notes (Signed)
  Oncology Nurse Navigator Documentation  Navigator Location: [facility] (01/06/16 0945) Navigator Encounter Type: Clinic/MDC (01/06/16 0945)   Abnormal Finding Date: 11/18/15 (01/06/16 0945) Confirmed Diagnosis Date: 11/22/15 (01/06/16 0945)   Treatment Initiated Date: 12/18/15 (01/06/16 0945) Patient Visit Type: RadOnc (01/06/16 0945) Treatment Phase: Active Tx (01/06/16 0945) Barriers/Navigation Needs: Education (01/06/16 0945) Education: Pain/ Symptom Management (01/06/16 0945) Interventions: Talk to a survivor (01/06/16 0945)     Education Method: Verbal (01/06/16 0945)      Acuity: Level 3 (01/06/16 0945)     Acuity Level 3: Nutritional support;Emotional needs;Ongoing guidance and education provided throughout treatment (01/06/16 0945)      Met with patient during daily [place] and Weekly Under Treat appointment with Dr. Isidore Moos.  He completed fxt 15 of 35 today.  He was accompanied by his wife, Eustaquio Maize.  He reported:  Pain with swallowing, relieved with oxycodone.  He stated lidocaine and carafate are ineffective in providing relief.  Limited oral intake through this past Sat, instilled 2 cans of Ensure Plus yesterday.  I encouraged him to increase to 4 or mor cans as tolerated to minimize additional weight loss.  He has an appt with Dory Peru, RD,  Wednesday of this week.  Applying Sonafine BID.  Thickened, ropey saliva.  I encouraged use of salt water/baking soda rinse to manage. Beth was tearful during visit, expressed feelings of "not able to do enough". She and Louie Casa were provided encouragement and support.  I provided them patient and caregiver notes of support written by [facility] attendees. I will continue to follow closely.  Gayleen Orem, RN, BSN, [place] [facility] [place] [phone]  Time Spent with Patient: 75 (01/06/16 0945)

## 2016-01-06 NOTE — Progress Notes (Signed)
Gregory Jenkins is here for his 15th fraction of radiation to his Left Tonsil and bilateral neck. He reports pain when swallowing and is taking oxycodone to relieve this pain. During the week he is taking 4-5 pills, and on the weekend 2-3 pills. He is using the lidocaine and carafate rinses during the day. He has not taken anything by mouth since Saturday. He is using the PEG tube exclusively now. Sunday he instilled 2 cans of Ensure plus. They would like to speak to nutrition regarding the amount of Tube feeding he should be instilling. He is drinking water orally about 2-3 seventeen ounce water each day, and some vitamin water. He reports his mouth is dry and has thick saliva present. His mouth is red with white patches present. His skin to his neck is red and he is using the sonafine cream as directed. The generalized rash to his body has improved, and he will receive chemotherapy on Wednesday.   BP 110/70 mmHg  Pulse 77  Temp(Src) 98.4 F (36.9 C)  Ht 5' 11.5" (1.816 m)  Wt 190 lb 8 oz (86.41 kg)  BMI 26.20 kg/m2  SpO2 99%   Othostatics: BP sitting 112/64 pulse 66 and BP standing 110/70 pulse 77.   Wt Readings from Last 3 Encounters:  01/06/16 190 lb 8 oz (86.41 kg)  12/31/15 198 lb (89.812 kg)  12/31/15 198 lb (89.812 kg)

## 2016-01-07 ENCOUNTER — Ambulatory Visit
Admission: RE | Admit: 2016-01-07 | Discharge: 2016-01-07 | Disposition: A | Discharge status: Home / Self Care | Payer: 59 | Source: Ambulatory Visit | Attending: Radiation Oncology | Admitting: Radiation Oncology

## 2016-01-07 ENCOUNTER — Other Ambulatory Visit (HOSPITAL_BASED_OUTPATIENT_CLINIC_OR_DEPARTMENT_OTHER): Payer: 59

## 2016-01-07 DIAGNOSIS — Z510 Encounter for antineoplastic radiation therapy: Secondary | ICD-10-CM | POA: Diagnosis not present

## 2016-01-07 DIAGNOSIS — C099 Malignant neoplasm of tonsil, unspecified: Secondary | ICD-10-CM | POA: Diagnosis not present

## 2016-01-07 LAB — COMPREHENSIVE METABOLIC PANEL
ALT: 19 U/L (ref 0–55)
AST: 20 U/L (ref 5–34)
Albumin: 3.4 g/dL — ABNORMAL LOW (ref 3.5–5.0)
Alkaline Phosphatase: 91 U/L (ref 40–150)
Anion Gap: 8 mEq/L (ref 3–11)
BUN: 10.2 mg/dL (ref 7.0–26.0)
CALCIUM: 10 mg/dL (ref 8.4–10.4)
CHLORIDE: 97 meq/L — AB (ref 98–109)
CO2: 32 meq/L — AB (ref 22–29)
CREATININE: 1.1 mg/dL (ref 0.7–1.3)
EGFR: 69 mL/min/{1.73_m2} — ABNORMAL LOW (ref >90)
Glucose: 113 mg/dl (ref 70–140)
Potassium: 4.7 mEq/L (ref 3.5–5.1)
Sodium: 137 mEq/L (ref 136–145)
TOTAL PROTEIN: 7.6 g/dL (ref 6.4–8.3)
Total Bilirubin: 0.47 mg/dL (ref 0.20–1.20)

## 2016-01-07 LAB — CBC WITH DIFFERENTIAL/PLATELET
BASO%: 0.3 % (ref 0.0–2.0)
Basophils Absolute: 0 10*3/uL (ref 0.0–0.1)
EOS%: 1.4 % (ref 0.0–7.0)
Eosinophils Absolute: 0.1 10*3/uL (ref 0.0–0.5)
HEMATOCRIT: 43.8 % (ref 38.4–49.9)
HGB: 14.5 g/dL (ref 13.0–17.1)
LYMPH#: 0.3 10*3/uL — AB (ref 0.9–3.3)
LYMPH%: 3.6 % — ABNORMAL LOW (ref 14.0–49.0)
MCH: 30.9 pg (ref 27.2–33.4)
MCHC: 33 g/dL (ref 32.0–36.0)
MCV: 93.7 fL (ref 79.3–98.0)
MONO#: 0.7 10*3/uL (ref 0.1–0.9)
MONO%: 9.8 % (ref 0.0–14.0)
NEUT%: 84.9 % — AB (ref 39.0–75.0)
NEUTROS ABS: 6.1 10*3/uL (ref 1.5–6.5)
Platelets: 178 10*3/uL (ref 140–400)
RBC: 4.68 10*6/uL (ref 4.20–5.82)
RDW: 13.2 % (ref 11.0–14.6)
WBC: 7.2 10*3/uL (ref 4.0–10.3)

## 2016-01-07 LAB — MAGNESIUM: MAGNESIUM: 2.3 mg/dL (ref 1.5–2.5)

## 2016-01-08 ENCOUNTER — Ambulatory Visit (HOSPITAL_BASED_OUTPATIENT_CLINIC_OR_DEPARTMENT_OTHER): Payer: 59 | Admitting: Hematology and Oncology

## 2016-01-08 ENCOUNTER — Ambulatory Visit (HOSPITAL_BASED_OUTPATIENT_CLINIC_OR_DEPARTMENT_OTHER): Payer: 59

## 2016-01-08 ENCOUNTER — Ambulatory Visit
Admission: RE | Admit: 2016-01-08 | Discharge: 2016-01-08 | Disposition: A | Discharge status: Home / Self Care | Payer: 59 | Source: Ambulatory Visit | Attending: Radiation Oncology | Admitting: Radiation Oncology

## 2016-01-08 ENCOUNTER — Ambulatory Visit: Payer: 59 | Admitting: Nutrition

## 2016-01-08 ENCOUNTER — Encounter: Payer: Self-pay | Admitting: Hematology and Oncology

## 2016-01-08 ENCOUNTER — Encounter: Payer: Self-pay | Admitting: *Deleted

## 2016-01-08 ENCOUNTER — Other Ambulatory Visit: Payer: Self-pay

## 2016-01-08 VITALS — BP 111/59 | HR 68 | Temp 98.8°F | Resp 18 | Wt 193.5 lb

## 2016-01-08 DIAGNOSIS — L270 Generalized skin eruption due to drugs and medicaments taken internally: Secondary | ICD-10-CM | POA: Diagnosis not present

## 2016-01-08 DIAGNOSIS — E441 Mild protein-calorie malnutrition: Secondary | ICD-10-CM

## 2016-01-08 DIAGNOSIS — C099 Malignant neoplasm of tonsil, unspecified: Secondary | ICD-10-CM

## 2016-01-08 DIAGNOSIS — K1230 Oral mucositis (ulcerative), unspecified: Secondary | ICD-10-CM | POA: Diagnosis not present

## 2016-01-08 DIAGNOSIS — Z5112 Encounter for antineoplastic immunotherapy: Secondary | ICD-10-CM

## 2016-01-08 DIAGNOSIS — Z510 Encounter for antineoplastic radiation therapy: Secondary | ICD-10-CM | POA: Diagnosis not present

## 2016-01-08 DIAGNOSIS — C090 Malignant neoplasm of tonsillar fossa: Secondary | ICD-10-CM

## 2016-01-08 HISTORY — DX: Oral mucositis (ulcerative), unspecified: K12.30

## 2016-01-08 MED ORDER — MORPHINE SULFATE (CONCENTRATE) 20 MG/ML PO SOLN: 10.0000 mg | ORAL | Status: DC | PRN

## 2016-01-08 MED ORDER — DIPHENHYDRAMINE HCL 50 MG/ML IJ SOLN
50.0000 mg | Freq: Once | INTRAMUSCULAR | Status: AC
  Administered 2016-01-08: 50 mg via INTRAVENOUS

## 2016-01-08 MED ORDER — SODIUM CHLORIDE 0.9 % IV SOLN
Freq: Once | INTRAVENOUS | Status: AC
  Administered 2016-01-08: 10:00:00 via INTRAVENOUS

## 2016-01-08 MED ORDER — DIPHENHYDRAMINE HCL 50 MG/ML IJ SOLN
INTRAMUSCULAR | Status: AC
Start: 2016-01-08 — End: 2016-01-08
  Filled 2016-01-08: qty 1

## 2016-01-08 MED ORDER — SODIUM CHLORIDE 0.9% FLUSH
10.0000 mL | INTRAVENOUS | Status: DC | PRN
  Administered 2016-01-08: 10 mL
  Filled 2016-01-08: qty 10

## 2016-01-08 MED ORDER — FENTANYL 25 MCG/HR TD PT72: 25.0000 ug | MEDICATED_PATCH | TRANSDERMAL | Status: DC

## 2016-01-08 MED ORDER — OSMOLITE 1.5 CAL PO LIQD: ORAL | Status: DC

## 2016-01-08 MED ORDER — CETUXIMAB CHEMO IV INJECTION 200 MG/100ML
300.0000 mg | Freq: Once | INTRAVENOUS | Status: AC
  Administered 2016-01-08: 300 mg via INTRAVENOUS
  Filled 2016-01-08: qty 100

## 2016-01-08 MED ORDER — HEPARIN SOD (PORK) LOCK FLUSH 100 UNIT/ML IV SOLN
500.0000 [IU] | Freq: Once | INTRAVENOUS | Status: AC | PRN
  Administered 2016-01-08: 500 [IU]
  Filled 2016-01-08: qty 5

## 2016-01-08 MED FILL — MORPHINE SULF 100 MG/5 ML S: 100 | 30 days supply | Qty: 180 | Fill #0

## 2016-01-08 MED FILL — FLUORISHIELD 1.1% GEL: 1.1 % | 30 days supply | Qty: 114 | Fill #1

## 2016-01-08 MED FILL — fentaNYL 25 MCG/HR PT72: 25 | 15 days supply | Qty: 5 | Fill #0

## 2016-01-08 NOTE — Assessment & Plan Note (Signed)
He has worsening mucositis, impairing his ability to get adequate oral intake. He is taking Xylocaine and the Carafate that were prescribed to him. I recommend we start him on fentanyl patch and liquid morphine for breakthrough pain medicine. I will assess his pain control next week

## 2016-01-08 NOTE — Assessment & Plan Note (Signed)
He had mild, worsening mucositis. Skin rash has improved. I would resume treatment today but with 50% dose adjustment. He will continue to return on a weekly basis for assessment

## 2016-01-08 NOTE — Progress Notes (Signed)
Erbitux dose reduced, reviewed dose with pharmacy: Dose rounded to the nearest 100 mg. Discussed with Dr. Alvy Bimler: OK to proceed with 300 mg dose.

## 2016-01-08 NOTE — Progress Notes (Signed)
  Oncology Nurse Navigator Documentation  Navigator Location: [facility]Med Onc (01/08/16 0945) Navigator Encounter Type: Treatment (01/08/16 0945)           Patient Visit Type: MedOnc (01/08/16 0945) Treatment Phase: Active Tx (01/08/16 0945)       To provide support and encouragement, care continuity, met with patient in Infusion.  His wife and son were at the bedside.  Mr. Lemmerman was sleeping.  His wife reported that he was going to receive a lower dosage of Erbitux per their meeting with Dr. Alvy Bimler earlier this morning.  They voiced understanding that Dory Peru, RD, would be by to provide nutritional guidance.  Gayleen Orem, RN, BSN, [place] at [place] [phone]                           Time Spent with Patient: 15 (01/08/16 0945)

## 2016-01-08 NOTE — Assessment & Plan Note (Signed)
He has recent weight loss and signs of protein calorie malnutrition. I recommend he increase oral intake and to start nutritional supplement via the feeding tube We will continue close monitoring of his weight in oral intake and he will continue close follow-up with dietitian during treatment.

## 2016-01-08 NOTE — Progress Notes (Signed)
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 Mild asymmetry of the left tonsil -- correlate with direct visualization to assess for primary neoplasm.   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   11/28/2015 PET scan 1. Left palatine tonsil primary with left worse than right cervical nodal metastasis. 2. No extracervical hypermetabolic metastasis.    12/12/2015 Procedure Peg and port-a-cath placement.   12/18/2015 -  Chemotherapy He received weekly cetuximab   12/18/2015 -  Radiation Therapy He received concurrent radiation   12/31/2015 Adverse Reaction Treatment #3 is placed on hold due to worsening mucositis   01/08/2016 Miscellaneous Cetuximab #3 resumed with 50% dose adjustment    INTERVAL HISTORY: Please see below for problem oriented charting. He is seen prior to cycle 3 of chemotherapy. His skin rash has improved. Pain control is slightly worse and he is using pain medicine 3 times a day. He has difficulty swallowing due to production of mucus but denies nausea or vomiting. He lost some weight. He has started using nutritional feeding through the feeding tube  REVIEW OF SYSTEMS:   Constitutional: Denies fevers, chills or abnormal weight loss Eyes: Denies blurriness  of vision Respiratory: Denies cough, dyspnea or wheezes Cardiovascular: Denies palpitation, chest discomfort or lower extremity swelling Gastrointestinal:  Denies nausea, heartburn or change in bowel habits Lymphatics: Denies new lymphadenopathy or easy bruising Neurological:Denies numbness, tingling or new weaknesses Behavioral/Psych: Mood is stable, no new changes  All other systems were reviewed with the patient and are negative.  I have reviewed the past medical history, past surgical history, social history and family history with the patient and they are unchanged from previous note.  ALLERGIES:  has No Known Allergies.  MEDICATIONS:  Current Outpatient Prescriptions  Medication Sig Dispense Refill  . aspirin EC 81 MG tablet Take 81 mg by mouth daily.    . clindamycin (CLINDAGEL) 1 % gel Apply topically 2 (two) times daily. 30 g 0  . fentaNYL (DURAGESIC - DOSED MCG/HR) 25 MCG/HR patch Place 1 patch (25 mcg total) onto the skin every 3 (three) days. 5 patch 0  . hydrocortisone 1 % ointment Apply 1 application topically 2 (two) times daily. 30 g 0  . ibuprofen (ADVIL,MOTRIN) 200 MG tablet Take 200 mg by mouth as needed.    Marland Kitchen levothyroxine (SYNTHROID, LEVOTHROID) 75 MCG tablet Take 75 mcg by mouth daily before breakfast.    . lidocaine (XYLOCAINE) 2 % solution Mix 1 part 2%viscous lidocaine,1part H2O.Swish and/or swallow 52mL of this mixture,60min before meals and at bedtime, up to QID 100 mL 5  . lidocaine-prilocaine (EMLA) cream Apply to affected area once 30 g 3  . losartan (COZAAR) 25 MG tablet Take 25 mg by mouth every morning. Reported on 12/31/2015  2  . morphine (ROXANOL) 20 MG/ML concentrated solution Place 0.5 mLs (10 mg total) into feeding tube every 2 (two)  hours as needed for severe pain. 240 mL 0  . ondansetron (ZOFRAN) 8 MG tablet Take 1 tablet (8 mg total) by mouth every 8 (eight) hours as needed for nausea. 30 tablet 3  . oxyCODONE-acetaminophen (PERCOCET/ROXICET) 5-325  MG tablet Take 1 tablet by mouth every 4 (four) hours as needed for moderate pain. Reported on 12/25/2015 60 tablet 0  . promethazine (PHENERGAN) 25 MG tablet Take 1 tablet (25 mg total) by mouth every 6 (six) hours as needed for nausea. (Patient not taking: Reported on 12/31/2015) 30 tablet 3  . sodium fluoride (FLUORISHIELD) 1.1 % GEL dental gel Instill one drop of gel per tooth space of fluoride tray. Place over teeth for 5 minutes. Remove. Spit out excess. Repeat nightly. 120 mL PRN  . sucralfate (CARAFATE) 1 g tablet Dissolve 1 tablet in 72mL H2O and swallow up to QID,PRN sore throat. 60 tablet 5   No current facility-administered medications for this visit.    PHYSICAL EXAMINATION: ECOG PERFORMANCE STATUS: 1 - Symptomatic but completely ambulatory  Filed Vitals:   01/08/16 0828  BP: 111/59  Pulse: 68  Temp: 98.8 F (37.1 C)  Resp: 18   Filed Weights   01/08/16 0828  Weight: 193 lb 8 oz (87.771 kg)    GENERAL:alert, no distress and comfortable SKIN: He has diffuse skin rash, mildly improved compared to prior visit EYES: normal, Conjunctiva are pink and non-injected, sclera clear OROPHARYNX: He has mucositis but nonulcerative. No thrush.  NECK: supple, thyroid normal size, non-tender, without nodularity LYMPH:  no palpable lymphadenopathy in the cervical, axillary or inguinal LUNGS: clear to auscultation and percussion with normal breathing effort HEART: regular rate & rhythm and no murmurs and no lower extremity edema ABDOMEN:abdomen soft, non-tender and normal bowel sounds. Feeding tube site looks okay Musculoskeletal:no cyanosis of digits and no clubbing  NEURO: alert & oriented x 3 with fluent speech, no focal motor/sensory deficits  LABORATORY DATA:  I have reviewed the data as listed    Component Value Date/Time   NA 137 01/07/2016 0939   NA 136 12/12/2015 1317   K 4.7 01/07/2016 0939   K 3.9 12/12/2015 1317   CL 103 12/12/2015 1317   CO2 32* 01/07/2016 0939   CO2  24 12/12/2015 1317   GLUCOSE 113 01/07/2016 0939   GLUCOSE 91 12/12/2015 1317   BUN 10.2 01/07/2016 0939   BUN 17 12/12/2015 1317   CREATININE 1.1 01/07/2016 0939   CREATININE 1.11 12/12/2015 1317   CALCIUM 10.0 01/07/2016 0939   CALCIUM 9.0 12/12/2015 1317   PROT 7.6 01/07/2016 0939   PROT 7.0 09/24/2009 1331   ALBUMIN 3.4* 01/07/2016 0939   ALBUMIN 4.1 09/24/2009 1331   AST 20 01/07/2016 0939   AST 18 09/24/2009 1331   ALT 19 01/07/2016 0939   ALT 40 09/24/2009 1331   ALKPHOS 91 01/07/2016 0939   ALKPHOS 123* 09/24/2009 1331   BILITOT 0.47 01/07/2016 0939   BILITOT 0.3 09/24/2009 1331   GFRNONAA >60 12/12/2015 1317   GFRAA >60 12/12/2015 1317    No results found for: SPEP, UPEP  Lab Results  Component Value Date   WBC 7.2 01/07/2016   NEUTROABS 6.1 01/07/2016   HGB 14.5 01/07/2016   HCT 43.8 01/07/2016   MCV 93.7 01/07/2016   PLT 178 01/07/2016      Chemistry      Component Value Date/Time   NA 137 01/07/2016 0939   NA 136 12/12/2015 1317   K 4.7 01/07/2016 [id number]  K 3.9 12/12/2015 1317   CL 103 12/12/2015 1317   CO2 32* 01/07/2016 0939   CO2 24 12/12/2015 1317   BUN 10.2 01/07/2016 0939   BUN 17 12/12/2015 1317   CREATININE 1.1 01/07/2016 0939   CREATININE 1.11 12/12/2015 1317      Component Value Date/Time   CALCIUM 10.0 01/07/2016 0939   CALCIUM 9.0 12/12/2015 1317   ALKPHOS 91 01/07/2016 0939   ALKPHOS 123* 09/24/2009 1331   AST 20 01/07/2016 0939   AST 18 09/24/2009 1331   ALT 19 01/07/2016 0939   ALT 40 09/24/2009 1331   BILITOT 0.47 01/07/2016 0939   BILITOT 0.3 09/24/2009 1331      ASSESSMENT & PLAN:  Tonsil cancer ([place]) He had mild, worsening mucositis. Skin rash has improved. I would resume treatment today but with 50% dose adjustment. He will continue to return on a weekly basis for assessment  Mucositis oral He has worsening mucositis, impairing his ability to get adequate oral intake. He is taking Xylocaine and the Carafate that  were prescribed to him. I recommend we start him on fentanyl patch and liquid morphine for breakthrough pain medicine. I will assess his pain control next week  Protein-calorie malnutrition, mild ([place]) He has recent weight loss and signs of protein calorie malnutrition. I recommend he increase oral intake and to start nutritional supplement via the feeding tube We will continue close monitoring of his weight in oral intake and he will continue close follow-up with dietitian during treatment.  Drug-induced skin rash He has grade 1 skin toxicity from it. I recommend he continues prescription hydrocortisone and topical antibiotic cream. I will reassess next week     No orders of the defined types were placed in this encounter.   All questions were answered. The patient knows to call the clinic with any problems, questions or concerns. No barriers to learning was detected. I spent 25 minutes counseling the patient face to face. The total time spent in the appointment was 30 minutes and more than 50% was on counseling and review of test results     [facility], [place], MD 01/08/2016 9:19 AM

## 2016-01-08 NOTE — Assessment & Plan Note (Signed)
He has grade 1 skin toxicity from it. I recommend he continues prescription hydrocortisone and topical antibiotic cream. I will reassess next week

## 2016-01-08 NOTE — Patient Instructions (Signed)
[facility] Discharge Instructions for Patients Receiving Chemotherapy  Today you received the following chemotherapy agent, Erbitux.  To help prevent nausea and vomiting after your treatment, we encourage you to take your nausea medication as prescribed.  If you develop nausea and vomiting that is not controlled by your nausea medication, call the clinic.   BELOW ARE SYMPTOMS THAT SHOULD BE REPORTED IMMEDIATELY:  *FEVER GREATER THAN 100.5 F  *CHILLS WITH OR WITHOUT FEVER  NAUSEA AND VOMITING THAT IS NOT CONTROLLED WITH YOUR NAUSEA MEDICATION  *UNUSUAL SHORTNESS OF BREATH  *UNUSUAL BRUISING OR BLEEDING  TENDERNESS IN MOUTH AND THROAT WITH OR WITHOUT PRESENCE OF ULCERS  *URINARY PROBLEMS  *BOWEL PROBLEMS  UNUSUAL RASH Items with * indicate a potential emergency and should be followed up as soon as possible.  Feel free to call the clinic you have any questions or concerns. The clinic phone number is [phone].  Please show the [place] at check-in to the Emergency Department and triage nurse.

## 2016-01-08 NOTE — Progress Notes (Signed)
Nutrition follow-up completed with patient and wife, during infusion for tonsil cancer. Patient is very sleepy and sleeps through bolus feeding. Wife reports patient has decreased oral intake since the weekend "to next to nothing". Patient is experiencing increased sputum and thick secretions. He has been using an equivalent of Ensure Plus in his feeding tube occasionally but does not like to use the feeding tube. He is drinking water by mouth.  However, it is painful.  Nutrition diagnosis: Inadequate oral intake continues.  Intervention:  Educated patient to begin bolus tube feedings using Osmolite 1.5 one can 4 times a day with 30 cc free water before and 60 cc free water after boluses Patient will increase bolus feedings every 2 days. On Friday patient will increase bolus feedings to 1-1/2 cans twice a day and continue one can twice a day as tolerated. On Sunday.  Patient will increase bolus feedings to 1-1/2 cans 4 times a day as tolerated. On Tuesday patient will increase to goal rate of 1-1/2 cans 3 times a day and 2 cans once a day to total 6 1/2 cans daily. Patient educated to drink an additional 32 ounces water daily.  By mouth. Fact sheets were provided.  Wife demonstrated bolus tube feeding with good results. Questions were answered.  Teach back method used. Tube feeding orders were written and advanced homecare was notified.  Monitoring, evaluation, goals: Patient will tolerate tube feeding advancement to goal rate to minimize weight loss and promote healing.  Next visit: Wednesday, March 22, during chemotherapy.  **Disclaimer: This note was dictated with voice recognition software. Similar sounding words can inadvertently be transcribed and this note may contain transcription errors which may not have been corrected upon publication of note.**

## 2016-01-09 ENCOUNTER — Ambulatory Visit
Admission: RE | Admit: 2016-01-09 | Discharge: 2016-01-09 | Disposition: A | Discharge status: Home / Self Care | Payer: 59 | Source: Ambulatory Visit | Attending: Radiation Oncology | Admitting: Radiation Oncology

## 2016-01-09 DIAGNOSIS — Z510 Encounter for antineoplastic radiation therapy: Secondary | ICD-10-CM | POA: Diagnosis not present

## 2016-01-10 ENCOUNTER — Ambulatory Visit
Admission: RE | Admit: 2016-01-10 | Discharge: 2016-01-10 | Disposition: A | Discharge status: Home / Self Care | Payer: 59 | Source: Ambulatory Visit | Attending: Radiation Oncology | Admitting: Radiation Oncology

## 2016-01-10 ENCOUNTER — Telehealth: Payer: Self-pay | Admitting: *Deleted

## 2016-01-10 DIAGNOSIS — Z510 Encounter for antineoplastic radiation therapy: Secondary | ICD-10-CM | POA: Diagnosis not present

## 2016-01-10 NOTE — Telephone Encounter (Signed)
  Oncology Nurse Navigator Documentation  Navigator Location: [facility] On[facility] (01/10/16 1445) Navigator Encounter Type: Telephone (01/10/16 1445) Telephone: Symptom Mgt (01/10/16 1445)       Returned patient wife's VMM in which she stated Gregory Jenkins is experiencing excessive belching and frequent acid reflux.  She noted they have not transitioned to the Osmolite 1.5 (from Jevity 1.5) yet per recent guidance from [facility].  I spoke with Raford Pitcher who advised that the belching will likely resolve upon switching to Osmolite from the higher fiber content Jevity.  Spoke with Mike Craze, NP, she recommended OTC Zantac for the acid reflux.  [facility], relayed the above information.  She voiced understanding and appreciation.  Gayleen Orem, RN, BSN, [place] at [place] [phone]                                     Time Spent with Patient: 15 (01/10/16 1445)

## 2016-01-13 ENCOUNTER — Encounter: Payer: Self-pay | Admitting: Radiation Oncology

## 2016-01-13 ENCOUNTER — Telehealth: Payer: Self-pay | Admitting: Hematology and Oncology

## 2016-01-13 ENCOUNTER — Ambulatory Visit
Admission: RE | Admit: 2016-01-13 | Discharge: 2016-01-13 | Disposition: A | Discharge status: Home / Self Care | Payer: 59 | Source: Ambulatory Visit | Attending: Radiation Oncology | Admitting: Radiation Oncology

## 2016-01-13 ENCOUNTER — Other Ambulatory Visit: Payer: Self-pay | Admitting: *Deleted

## 2016-01-13 ENCOUNTER — Encounter: Payer: Self-pay | Admitting: *Deleted

## 2016-01-13 VITALS — BP 121/64 | HR 81 | Resp 16 | Wt 187.0 lb

## 2016-01-13 DIAGNOSIS — Z923 Personal history of irradiation: Secondary | ICD-10-CM | POA: Diagnosis not present

## 2016-01-13 DIAGNOSIS — Z510 Encounter for antineoplastic radiation therapy: Secondary | ICD-10-CM | POA: Diagnosis not present

## 2016-01-13 DIAGNOSIS — C090 Malignant neoplasm of tonsillar fossa: Secondary | ICD-10-CM | POA: Insufficient documentation

## 2016-01-13 MED ORDER — LORAZEPAM 0.5 MG PO TABS: ORAL_TABLET | ORAL | Status: DC

## 2016-01-13 MED ORDER — LORAZEPAM 0.5 MG PO TABS
0.5000 mg | ORAL_TABLET | ORAL | Status: AC
  Administered 2016-01-13: 0.5 mg via ORAL
  Filled 2016-01-13: qty 1

## 2016-01-13 MED ORDER — SCOPOLAMINE 1 MG/3DAYS TD PT72: 1.0000 | MEDICATED_PATCH | TRANSDERMAL | Status: DC

## 2016-01-13 NOTE — Progress Notes (Signed)
Weekly Management Note:  Outpatient    ICD-9-CM ICD-10-CM   1. 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  CBCT/MVCT images/Port film x-rays were reviewed.  The chart was checked.   Patient requesting to be evaluated prior to radiation treatment. Patient expresses fear of choking on thick phlegm while laying flat for treatment. Hyperpigmentation with dry desquamation of anterior neck noted. Reports using Sonafine as directed on within treatment field.  Reports he is no longer taking in anything by mouth but, is 100% tube feed. Reports instilling Osmolite four times per day. Patient does not prove to be orthostatic but, weight loss is noted. Reports Zantac 150 bid is working well. Reports a persistent cough in an attempt to clear phlegm but, is unsuccessful.   Sitting 75, 114/69 Standing 81, 121/64  Physical Findings:  Wt Readings from Last 3 Encounters:  01/13/16 187 lb (84.823 kg)  01/08/16 193 lb 8 oz (87.771 kg)  01/06/16 190 lb 8 oz (86.41 kg)    weight is 187 lb (84.823 kg). His blood pressure is 121/64 and his pulse is 81. His respiration is 16 and oxygen saturation is 100%.   Skin erythematous and with resolving acneiform rash. Erythema in oropharynx with thick saliva/patchy mucositis. No thrush.   ++ gag reflex  CBC    Component Value Date/Time   WBC 7.2 01/07/2016 0939   WBC 6.8 12/12/2015 1317   RBC 4.68 01/07/2016 0939   RBC 4.63 12/12/2015 1317   HGB 14.5 01/07/2016 0939   HGB 14.4 12/12/2015 1317   HCT 43.8 01/07/2016 0939   HCT 43.5 12/12/2015 1317   PLT 178 01/07/2016 0939   PLT 213 12/12/2015 1317   MCV 93.7 01/07/2016 0939   MCV 94.0 12/12/2015 1317   MCH 30.9 01/07/2016 0939   MCH 31.1 12/12/2015 1317   MCHC 33.0  01/07/2016 0939   MCHC 33.1 12/12/2015 1317   RDW 13.2 01/07/2016 0939   RDW 12.9 12/12/2015 1317   LYMPHSABS 0.3* 01/07/2016 0939   LYMPHSABS 1.6 12/12/2015 1317   MONOABS 0.7 01/07/2016 0939   MONOABS 0.5 12/12/2015 1317   EOSABS 0.1 01/07/2016 0939   EOSABS 0.1 12/12/2015 1317   BASOSABS 0.0 01/07/2016 0939   BASOSABS 0.0 12/12/2015 1317     CMP     Component Value Date/Time   NA 137 01/07/2016 0939   NA 136 12/12/2015 1317   K 4.7 01/07/2016 0939   K 3.9 12/12/2015 1317   CL 103 12/12/2015 1317   CO2 32* 01/07/2016 0939   CO2 24 12/12/2015 1317   GLUCOSE 113 01/07/2016 0939   GLUCOSE 91 12/12/2015 1317   BUN 10.2 01/07/2016 0939   BUN 17 12/12/2015 1317   CREATININE 1.1 01/07/2016 0939   CREATININE 1.11 12/12/2015 1317   CALCIUM 10.0 01/07/2016 0939   CALCIUM 9.0 12/12/2015 1317   PROT 7.6 01/07/2016 0939   PROT 7.0 09/24/2009 1331   ALBUMIN 3.4* 01/07/2016 0939   ALBUMIN 4.1 09/24/2009 1331   AST 20 01/07/2016 0939   AST 18 09/24/2009 1331   ALT 19 01/07/2016 0939   ALT 40 09/24/2009 1331   ALKPHOS 91 01/07/2016 0939   ALKPHOS 123* 09/24/2009 1331   BILITOT 0.47 01/07/2016 [id number]  BILITOT 0.3 09/24/2009 1331   GFRNONAA >60 12/12/2015 1317   GFRAA >60 12/12/2015 1317    Impression:  The patient is tolerating radiotherapy. Sees med/onc tomorrow w/ labs  Plan:  Continue radiotherapy as planned. Rx Ativan 0.5mg  to take prior to RT to help with tolerance. Will give him this now and reattempt tx today (addendum -- pt succeeded)  Scopolamine Rx also given to help with thick secretions.  -----------------------------------  Eppie Gibson, MD

## 2016-01-13 NOTE — Progress Notes (Signed)
Patient requesting to be evaluated prior to radiation treatment by Dr. Isidore Moos. Patient expresses fear of choking on thick phlegm while laying flat for treatment. Hyperpigmentation with dry desquamation of anterior neck noted. Reports using Sonafine as directed on within treatment field.  Reports he is no longer taking in anything by mouth but, is 100% tube feed. Reports instilling Osmolite four times per day. Patient does not prove to be orthostatic but, weight loss is noted. Reports Zantac 150 bid is working well. Reports a persistent cough in an attempt to clear phlegm but, is unsuccessful.   Sitting 75, 114/69 Standing 81, 121/64  Wt Readings from Last 3 Encounters:  01/13/16 187 lb (84.823 kg)  01/08/16 193 lb 8 oz (87.771 kg)  01/06/16 190 lb 8 oz (86.41 kg)

## 2016-01-13 NOTE — Telephone Encounter (Signed)
cld pt & left a message to adv of appt for 3/21 sch be here @ 8 for labs 8:30 [place] per pof

## 2016-01-14 ENCOUNTER — Telehealth: Payer: Self-pay | Admitting: Hematology and Oncology

## 2016-01-14 ENCOUNTER — Other Ambulatory Visit: Payer: Self-pay

## 2016-01-14 ENCOUNTER — Encounter: Payer: Self-pay | Admitting: Hematology and Oncology

## 2016-01-14 ENCOUNTER — Ambulatory Visit (HOSPITAL_BASED_OUTPATIENT_CLINIC_OR_DEPARTMENT_OTHER): Payer: 59 | Admitting: Hematology and Oncology

## 2016-01-14 ENCOUNTER — Ambulatory Visit
Admission: RE | Admit: 2016-01-14 | Discharge: 2016-01-14 | Disposition: A | Discharge status: Home / Self Care | Payer: 59 | Source: Ambulatory Visit | Attending: Radiation Oncology | Admitting: Radiation Oncology

## 2016-01-14 ENCOUNTER — Telehealth: Payer: Self-pay | Admitting: *Deleted

## 2016-01-14 ENCOUNTER — Other Ambulatory Visit (HOSPITAL_BASED_OUTPATIENT_CLINIC_OR_DEPARTMENT_OTHER): Payer: 59

## 2016-01-14 VITALS — BP 112/60 | HR 63 | Temp 98.3°F | Resp 18 | Ht 71.5 in | Wt 190.2 lb

## 2016-01-14 DIAGNOSIS — E441 Mild protein-calorie malnutrition: Secondary | ICD-10-CM

## 2016-01-14 DIAGNOSIS — C099 Malignant neoplasm of tonsil, unspecified: Secondary | ICD-10-CM | POA: Diagnosis not present

## 2016-01-14 DIAGNOSIS — J392 Other diseases of pharynx: Secondary | ICD-10-CM

## 2016-01-14 DIAGNOSIS — L270 Generalized skin eruption due to drugs and medicaments taken internally: Secondary | ICD-10-CM | POA: Diagnosis not present

## 2016-01-14 DIAGNOSIS — K1230 Oral mucositis (ulcerative), unspecified: Secondary | ICD-10-CM | POA: Diagnosis not present

## 2016-01-14 DIAGNOSIS — Z510 Encounter for antineoplastic radiation therapy: Secondary | ICD-10-CM | POA: Diagnosis not present

## 2016-01-14 DIAGNOSIS — R198 Other specified symptoms and signs involving the digestive system and abdomen: Secondary | ICD-10-CM | POA: Insufficient documentation

## 2016-01-14 LAB — CBC WITH DIFFERENTIAL/PLATELET
BASO%: 0.3 % (ref 0.0–2.0)
Basophils Absolute: 0 10*3/uL (ref 0.0–0.1)
EOS%: 3.7 % (ref 0.0–7.0)
Eosinophils Absolute: 0.2 10*3/uL (ref 0.0–0.5)
HEMATOCRIT: 42.5 % (ref 38.4–49.9)
HEMOGLOBIN: 14.1 g/dL (ref 13.0–17.1)
LYMPH#: 0.2 10*3/uL — AB (ref 0.9–3.3)
LYMPH%: 3.1 % — ABNORMAL LOW (ref 14.0–49.0)
MCH: 31 pg (ref 27.2–33.4)
MCHC: 33.1 g/dL (ref 32.0–36.0)
MCV: 93.9 fL (ref 79.3–98.0)
MONO#: 0.7 10*3/uL (ref 0.1–0.9)
MONO%: 11.2 % (ref 0.0–14.0)
NEUT#: 4.9 10*3/uL (ref 1.5–6.5)
NEUT%: 81.7 % — AB (ref 39.0–75.0)
Platelets: 164 10*3/uL (ref 140–400)
RBC: 4.53 10*6/uL (ref 4.20–5.82)
RDW: 13.4 % (ref 11.0–14.6)
WBC: 6 10*3/uL (ref 4.0–10.3)

## 2016-01-14 LAB — COMPREHENSIVE METABOLIC PANEL
ALBUMIN: 3.1 g/dL — AB (ref 3.5–5.0)
ALK PHOS: 72 U/L (ref 40–150)
ALT: 15 U/L (ref 0–55)
AST: 13 U/L (ref 5–34)
Anion Gap: 7 mEq/L (ref 3–11)
BUN: 14.4 mg/dL (ref 7.0–26.0)
CHLORIDE: 101 meq/L (ref 98–109)
CO2: 31 meq/L — AB (ref 22–29)
Calcium: 9.6 mg/dL (ref 8.4–10.4)
Creatinine: 0.9 mg/dL (ref 0.7–1.3)
EGFR: 87 mL/min/{1.73_m2} — AB (ref >90)
GLUCOSE: 114 mg/dL (ref 70–140)
POTASSIUM: 4.1 meq/L (ref 3.5–5.1)
SODIUM: 140 meq/L (ref 136–145)
Total Bilirubin: 0.44 mg/dL (ref 0.20–1.20)
Total Protein: 7.2 g/dL (ref 6.4–8.3)

## 2016-01-14 LAB — MAGNESIUM: Magnesium: 2.3 mg/dl (ref 1.5–2.5)

## 2016-01-14 NOTE — Assessment & Plan Note (Signed)
Unfortunately, he continues to have progressive decline since the last time I saw him. He has worsening mucositis in his mouth. I will hold treatment again this week I will start him on supportive IV fluids daily. I will see him back again next week

## 2016-01-14 NOTE — Assessment & Plan Note (Signed)
He has grade 1 skin toxicity from it. I recommend he continues prescription hydrocortisone and topical antibiotic cream. I will reassess next week

## 2016-01-14 NOTE — Progress Notes (Signed)
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 Mild asymmetry of the left tonsil -- correlate with direct visualization to assess for primary neoplasm.   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   11/28/2015 PET scan 1. Left palatine tonsil primary with left worse than right cervical nodal metastasis. 2. No extracervical hypermetabolic metastasis.    12/12/2015 Procedure Peg and port-a-cath placement.   12/18/2015 -  Chemotherapy He received weekly cetuximab   12/18/2015 -  Radiation Therapy He received concurrent radiation   12/31/2015 Adverse Reaction Treatment #3 is placed on hold due to worsening mucositis   01/08/2016 Miscellaneous Cetuximab #3 resumed with 50% dose adjustment   01/14/2016 Adverse Reaction Treatment #4 is placed on hold due to worsening mucositis and failure to thrive    INTERVAL HISTORY: Please see below for problem oriented charting. He returns for cycle 4 of chemotherapy. He is miserable because of mucus gagging He has poor oral intake and currently dependent on feeding to 100%. His wife find it difficult to give him four cans of nutritional supplement due to significant reflux and nausea. He rated his oral pain at 2-3 out of 10 He has mild worsening  skin rash since chemotherapy last week REVIEW OF SYSTEMS:   Constitutional: Denies fevers, chills  Eyes: Denies blurriness of vision Respiratory: Denies cough, dyspnea or wheezes Cardiovascular: Denies palpitation, chest discomfort or lower extremity swelling Lymphatics: Denies new lymphadenopathy or easy bruising Neurological:Denies numbness, tingling or new weaknesses Behavioral/Psych: Mood is stable, no new changes  All other systems were reviewed with the patient and are negative.  I have reviewed the past medical history, past surgical history, social history and family history with the patient and they are unchanged from previous note.  ALLERGIES:  has No Known Allergies.  MEDICATIONS:  Current Outpatient Prescriptions  Medication Sig Dispense Refill  . aspirin EC 81 MG tablet Take 81 mg by mouth daily.    . clindamycin (CLINDAGEL) 1 % gel Apply topically 2 (two) times daily. 30 g 0  . fentaNYL (DURAGESIC - DOSED MCG/HR) 25 MCG/HR patch Place 1 patch (25 mcg total) onto the skin every 3 (three) days. 5 patch 0  . hydrocortisone 1 % ointment Apply 1 application topically 2 (two) times daily. 30 g 0  . levothyroxine (SYNTHROID, LEVOTHROID) 75 MCG tablet Take 75 mcg by mouth daily before breakfast.    . lidocaine (XYLOCAINE) 2 % solution Mix 1 part 2%viscous lidocaine,1part H2O.Swish and/or swallow 84mL of this mixture,48min before meals and at bedtime, up to QID 100 mL 5  . lidocaine-prilocaine (EMLA) cream Apply to affected area once 30 g 3  . losartan (COZAAR) 25 MG tablet Take 25 mg by mouth every morning. Reported on 01/13/2016  2  . Nutritional Supplements (FEEDING SUPPLEMENT, OSMOLITE 1.5 CAL,) LIQD Begin one can Osmolite 1.5, or equivalent, QID with 30 cc free water  before and 60 cc free water after. On day 3, increase to 1 1/2 cans BID and continue 2 cans BID as tolerated.On day 5, increase to 1 1/2 cans QID as tolerated.On day 7, increase to goal rate of 1 1/2 cans TID and two  cans once for total of 6.5 cans daily.Flush tube or drink addition 32 oz water daily.Send TF and supplies. 1541 mL 4  . ranitidine (ZANTAC) 150 MG capsule Take 150 mg by mouth 2 (two) times daily.    Marland Kitchen scopolamine (TRANSDERM-SCOP) 1 MG/3DAYS Place 1 patch (1.5 mg total) onto the skin every 3 (three) days. 10 patch 1  . ibuprofen (ADVIL,MOTRIN) 200 MG tablet Take 200 mg by mouth as needed. Reported on 01/14/2016    . LORazepam (ATIVAN) 0.5 MG tablet Take 1 tablet 20-30 minutes prior to treatment. (Patient not taking: Reported on 01/14/2016) 20 tablet 0  . morphine (ROXANOL) 20 MG/ML concentrated solution Place 0.5 mLs (10 mg total) into feeding tube every 2 (two) hours as needed for severe pain. (Patient not taking: Reported on 01/13/2016) 240 mL 0  . ondansetron (ZOFRAN) 8 MG tablet Take 1 tablet (8 mg total) by mouth every 8 (eight) hours as needed for nausea. (Patient not taking: Reported on 01/14/2016) 30 tablet 3  . oxyCODONE-acetaminophen (PERCOCET/ROXICET) 5-325 MG tablet Take 1 tablet by mouth every 4 (four) hours as needed for moderate pain. Reported on 12/25/2015 (Patient not taking: Reported on 01/13/2016) 60 tablet 0  . promethazine (PHENERGAN) 25 MG tablet Take 1 tablet (25 mg total) by mouth every 6 (six) hours as needed for nausea. (Patient not taking: Reported on 12/31/2015) 30 tablet 3  . sodium fluoride (FLUORISHIELD) 1.1 % GEL dental gel Instill one drop of gel per tooth space of fluoride tray. Place over teeth for 5 minutes. Remove. Spit out excess. Repeat nightly. 120 mL PRN  . sucralfate (CARAFATE) 1 g tablet Dissolve 1 tablet in 48mL H2O and swallow up to QID,PRN sore throat. (Patient not taking: Reported on 01/13/2016) 60 tablet 5   No current facility-administered medications for this visit.    PHYSICAL EXAMINATION: ECOG PERFORMANCE STATUS: 1 - Symptomatic but completely ambulatory  Filed Vitals:   01/14/16 0831  BP: 112/60  Pulse: 63  Temp: 98.3 F (36.8 C)  Resp: 18   Filed  Weights   01/14/16 0831  Weight: 190 lb 3.2 oz (86.274 kg)    GENERAL:alert, Appeared uncomfortable with moderate distress SKIN: He had grade 1-2 skin toxicity without ulceration EYES: normal, Conjunctiva are pink and non-injected, sclera clear OROPHARYNX: Noted significant mucositis. No thrush NECK: supple, thyroid normal size, non-tender, without nodularity ABDOMEN:abdomen soft, non-tender and normal bowel sounds Musculoskeletal:no cyanosis of digits and no clubbing  NEURO: alert & oriented x 3 with fluent speech, no focal motor/sensory deficits  LABORATORY DATA:  I have reviewed the data as listed    Component Value Date/Time   NA 140 01/14/2016 0818   NA 136 12/12/2015 1317   K 4.1 01/14/2016 0818   K 3.9 12/12/2015 1317   CL 103 12/12/2015 1317   CO2 31* 01/14/2016 0818   CO2 24 12/12/2015 1317   GLUCOSE 114 01/14/2016 0818   GLUCOSE 91 12/12/2015 1317   BUN 14.4 01/14/2016 0818   BUN 17 12/12/2015 1317   CREATININE 0.9 01/14/2016 0818   CREATININE 1.11 12/12/2015 1317   CALCIUM 9.6 01/14/2016 0818   CALCIUM 9.0 12/12/2015 1317   PROT 7.2 01/14/2016 0818   PROT 7.0 09/24/2009 1331  ALBUMIN 3.1* 01/14/2016 0818   ALBUMIN 4.1 09/24/2009 1331   AST 13 01/14/2016 0818   AST 18 09/24/2009 1331   ALT 15 01/14/2016 0818   ALT 40 09/24/2009 1331   ALKPHOS 72 01/14/2016 0818   ALKPHOS 123* 09/24/2009 1331   BILITOT 0.44 01/14/2016 0818   BILITOT 0.3 09/24/2009 1331   GFRNONAA >60 12/12/2015 1317   GFRAA >60 12/12/2015 1317    No results found for: SPEP, UPEP  Lab Results  Component Value Date   WBC 6.0 01/14/2016   NEUTROABS 4.9 01/14/2016   HGB 14.1 01/14/2016   HCT 42.5 01/14/2016   MCV 93.9 01/14/2016   PLT 164 01/14/2016      Chemistry      Component Value Date/Time   NA 140 01/14/2016 0818   NA 136 12/12/2015 1317   K 4.1 01/14/2016 0818   K 3.9 12/12/2015 1317   CL 103 12/12/2015 1317   CO2 31* 01/14/2016 0818   CO2 24 12/12/2015 1317   BUN  14.4 01/14/2016 0818   BUN 17 12/12/2015 1317   CREATININE 0.9 01/14/2016 0818   CREATININE 1.11 12/12/2015 1317      Component Value Date/Time   CALCIUM 9.6 01/14/2016 0818   CALCIUM 9.0 12/12/2015 1317   ALKPHOS 72 01/14/2016 0818   ALKPHOS 123* 09/24/2009 1331   AST 13 01/14/2016 0818   AST 18 09/24/2009 1331   ALT 15 01/14/2016 0818   ALT 40 09/24/2009 1331   BILITOT 0.44 01/14/2016 0818   BILITOT 0.3 09/24/2009 1331      ASSESSMENT & PLAN:  Tonsil cancer ([place]) Unfortunately, he continues to have progressive decline since the last time I saw him. He has worsening mucositis in his mouth. I will hold treatment again this week I will start him on supportive IV fluids daily. I will see him back again next week  Mucositis oral He has worsening mucositis despite lowering the dose of cetuximab I plan to hold treatment today and to provide IV fluid support  I will see him next week for further assessment  Protein-calorie malnutrition, mild ([place]) He is struggling with oral intake and has lost weight. He is 100% dependent on feeding tube now I recommend IV fluid support daily and to focus on nutritional supplement through the feeding tube   Drug-induced skin rash He has grade 1 skin toxicity from it. I recommend he continues prescription hydrocortisone and topical antibiotic cream. I will reassess next week   Gagging episode He had frequent gagging from mucous production. I recommend he starts scopolamine patch as prescribed   No orders of the defined types were placed in this encounter.   All questions were answered. The patient knows to call the clinic with any problems, questions or concerns. No barriers to learning was detected. I spent 25 minutes counseling the patient face to face. The total time spent in the appointment was 40 minutes and more than 50% was on counseling and review of test results     [facility], [place], MD 01/14/2016 4:27 PM

## 2016-01-14 NOTE — Telephone Encounter (Signed)
Per staff message and POF I have scheduled appts. Advised scheduler of appts. JMW  

## 2016-01-14 NOTE — Assessment & Plan Note (Signed)
He is struggling with oral intake and has lost weight. He is 100% dependent on feeding tube now I recommend IV fluid support daily and to focus on nutritional supplement through the feeding tube

## 2016-01-14 NOTE — Assessment & Plan Note (Signed)
He had frequent gagging from mucous production. I recommend he starts scopolamine patch as prescribed

## 2016-01-14 NOTE — Assessment & Plan Note (Signed)
He has worsening mucositis despite lowering the dose of cetuximab I plan to hold treatment today and to provide IV fluid support  I will see him next week for further assessment

## 2016-01-14 NOTE — Telephone Encounter (Signed)
per pof to sch pt appt-sent MW emailt o sch trmt-pt to get updated sch on 3/22 appt

## 2016-01-15 ENCOUNTER — Ambulatory Visit
Admission: RE | Admit: 2016-01-15 | Discharge: 2016-01-15 | Disposition: A | Discharge status: Home / Self Care | Payer: 59 | Source: Ambulatory Visit | Attending: Radiation Oncology | Admitting: Radiation Oncology

## 2016-01-15 ENCOUNTER — Ambulatory Visit: Payer: 59 | Admitting: Nutrition

## 2016-01-15 ENCOUNTER — Ambulatory Visit: Payer: Self-pay | Admitting: Hematology and Oncology

## 2016-01-15 ENCOUNTER — Encounter: Payer: Self-pay | Admitting: Hematology and Oncology

## 2016-01-15 ENCOUNTER — Ambulatory Visit (HOSPITAL_BASED_OUTPATIENT_CLINIC_OR_DEPARTMENT_OTHER): Payer: 59

## 2016-01-15 VITALS — BP 112/65 | HR 62 | Temp 98.6°F

## 2016-01-15 DIAGNOSIS — E860 Dehydration: Secondary | ICD-10-CM

## 2016-01-15 DIAGNOSIS — C090 Malignant neoplasm of tonsillar fossa: Secondary | ICD-10-CM

## 2016-01-15 DIAGNOSIS — Z510 Encounter for antineoplastic radiation therapy: Secondary | ICD-10-CM | POA: Diagnosis not present

## 2016-01-15 DIAGNOSIS — K1230 Oral mucositis (ulcerative), unspecified: Secondary | ICD-10-CM

## 2016-01-15 MED ORDER — SODIUM CHLORIDE 0.9 % IV SOLN
Freq: Once | INTRAVENOUS | Status: AC
  Administered 2016-01-15: 09:00:00 via INTRAVENOUS
  Filled 2016-01-15: qty 4

## 2016-01-15 MED ORDER — SODIUM CHLORIDE 0.9 % IJ SOLN
10.0000 mL | INTRAMUSCULAR | Status: DC | PRN
  Administered 2016-01-15: 10 mL
  Filled 2016-01-15: qty 10

## 2016-01-15 MED ORDER — HEPARIN SOD (PORK) LOCK FLUSH 100 UNIT/ML IV SOLN
500.0000 [IU] | Freq: Once | INTRAVENOUS | Status: AC | PRN
  Administered 2016-01-15: 500 [IU]
  Filled 2016-01-15: qty 5

## 2016-01-15 MED ORDER — SODIUM CHLORIDE 0.9 % IV SOLN
Freq: Once | INTRAVENOUS | Status: AC
  Administered 2016-01-15: 09:00:00 via INTRAVENOUS

## 2016-01-15 NOTE — Patient Instructions (Signed)

## 2016-01-15 NOTE — Progress Notes (Signed)
Nutrition follow up completed with patient and wife, during chemotherapy for tonsil cancer.   Patient continues to sleep most of the day and night. Wife reports patient has had a lot of difficulty consuming water by mouth. Reports he is scheduled to receive daily IV fluids to allow opportunity for increasing nutrition through tube. Per wife, patient tolerating Osmolite 1.5 approximately 4-5 cans a day with 30 cc of water before and after bolus feedings. Patient is unable to eat by mouth. Per wife, patient is not having nutrition impact symptoms related to tube feeding.  Nutrition diagnosis: Inadequate oral intake continues.  Estimated nutrition needs: 2150-2350 calories, 104-115 grams protein, 2.3 L fluid.  6.5 cans Osmolite 1.5 provides 2308 cal, 97 g protein, 1177 mL free water.  Intervention:  Provided supportive listening. Encouraged patient's wife to work to increase tube feeding to goal rate and discussed ways to make this easier. Wife feels more confident that enteral nutrition infusion can increase when free water flushes are not required. Questions were answered.  Teach back method used.  Monitoring, evaluation, goals: Patient will work to increase tube feedings to meet greater than 90% of estimated needs.  Next visit: Wednesday, March 29, during infusion.  **Disclaimer: This note was dictated with voice recognition software. Similar sounding words can inadvertently be transcribed and this note may contain transcription errors which may not have been corrected upon publication of note.**

## 2016-01-16 ENCOUNTER — Telehealth: Payer: Self-pay | Admitting: Hematology and Oncology

## 2016-01-16 ENCOUNTER — Other Ambulatory Visit: Payer: Self-pay | Admitting: Hematology and Oncology

## 2016-01-16 ENCOUNTER — Ambulatory Visit
Admission: RE | Admit: 2016-01-16 | Discharge: 2016-01-16 | Disposition: A | Discharge status: Home / Self Care | Payer: 59 | Source: Ambulatory Visit | Attending: Radiation Oncology | Admitting: Radiation Oncology

## 2016-01-16 ENCOUNTER — Ambulatory Visit (HOSPITAL_BASED_OUTPATIENT_CLINIC_OR_DEPARTMENT_OTHER): Payer: 59

## 2016-01-16 VITALS — BP 109/60 | HR 64 | Temp 98.4°F | Resp 20

## 2016-01-16 DIAGNOSIS — E860 Dehydration: Secondary | ICD-10-CM | POA: Diagnosis not present

## 2016-01-16 DIAGNOSIS — C090 Malignant neoplasm of tonsillar fossa: Secondary | ICD-10-CM | POA: Diagnosis not present

## 2016-01-16 DIAGNOSIS — Z510 Encounter for antineoplastic radiation therapy: Secondary | ICD-10-CM | POA: Diagnosis not present

## 2016-01-16 DIAGNOSIS — K1230 Oral mucositis (ulcerative), unspecified: Secondary | ICD-10-CM

## 2016-01-16 MED ORDER — HEPARIN SOD (PORK) LOCK FLUSH 100 UNIT/ML IV SOLN
500.0000 [IU] | Freq: Once | INTRAVENOUS | Status: AC | PRN
  Administered 2016-01-16: 500 [IU]
  Filled 2016-01-16: qty 5

## 2016-01-16 MED ORDER — SODIUM CHLORIDE 0.9 % IV SOLN
Freq: Once | INTRAVENOUS | Status: AC
  Administered 2016-01-16: 11:00:00 via INTRAVENOUS
  Filled 2016-01-16: qty 4

## 2016-01-16 MED ORDER — SODIUM CHLORIDE 0.9 % IJ SOLN
10.0000 mL | INTRAMUSCULAR | Status: DC | PRN
  Administered 2016-01-16: 10 mL
  Filled 2016-01-16: qty 10

## 2016-01-16 MED ORDER — SODIUM CHLORIDE 0.9 % IV SOLN
Freq: Once | INTRAVENOUS | Status: AC
  Administered 2016-01-16: 10:00:00 via INTRAVENOUS

## 2016-01-16 NOTE — Patient Instructions (Signed)

## 2016-01-16 NOTE — Telephone Encounter (Signed)
per pof to sch pt appt-pt to get updated copy b4 leaving trmt °

## 2016-01-17 ENCOUNTER — Encounter: Payer: Self-pay | Admitting: *Deleted

## 2016-01-17 ENCOUNTER — Ambulatory Visit (HOSPITAL_BASED_OUTPATIENT_CLINIC_OR_DEPARTMENT_OTHER): Payer: 59

## 2016-01-17 ENCOUNTER — Ambulatory Visit
Admission: RE | Admit: 2016-01-17 | Discharge: 2016-01-17 | Disposition: A | Discharge status: Home / Self Care | Payer: 59 | Source: Ambulatory Visit | Attending: Radiation Oncology | Admitting: Radiation Oncology

## 2016-01-17 ENCOUNTER — Encounter: Payer: Self-pay | Admitting: Radiation Oncology

## 2016-01-17 VITALS — BP 108/53 | HR 71 | Temp 98.4°F | Resp 18

## 2016-01-17 DIAGNOSIS — C090 Malignant neoplasm of tonsillar fossa: Secondary | ICD-10-CM | POA: Diagnosis not present

## 2016-01-17 DIAGNOSIS — K1230 Oral mucositis (ulcerative), unspecified: Secondary | ICD-10-CM

## 2016-01-17 DIAGNOSIS — E860 Dehydration: Secondary | ICD-10-CM

## 2016-01-17 DIAGNOSIS — Z510 Encounter for antineoplastic radiation therapy: Secondary | ICD-10-CM | POA: Diagnosis not present

## 2016-01-17 MED ORDER — SODIUM CHLORIDE 0.9 % IJ SOLN
10.0000 mL | INTRAMUSCULAR | Status: DC | PRN
  Administered 2016-01-17: 10 mL
  Filled 2016-01-17: qty 10

## 2016-01-17 MED ORDER — SODIUM CHLORIDE 0.9 % IV SOLN
Freq: Once | INTRAVENOUS | Status: AC
  Administered 2016-01-17: 10:00:00 via INTRAVENOUS
  Filled 2016-01-17: qty 4

## 2016-01-17 MED ORDER — HEPARIN SOD (PORK) LOCK FLUSH 100 UNIT/ML IV SOLN
500.0000 [IU] | Freq: Once | INTRAVENOUS | Status: AC | PRN
  Administered 2016-01-17: 500 [IU]
  Filled 2016-01-17: qty 5

## 2016-01-17 MED ORDER — SODIUM CHLORIDE 0.9 % IV SOLN
Freq: Once | INTRAVENOUS | Status: AC
  Administered 2016-01-17: 10:00:00 via INTRAVENOUS

## 2016-01-17 NOTE — Patient Instructions (Signed)

## 2016-01-17 NOTE — Progress Notes (Signed)
Paperwork received, given to nursing, 3/24 Ardeen Fillers)

## 2016-01-18 ENCOUNTER — Ambulatory Visit (HOSPITAL_BASED_OUTPATIENT_CLINIC_OR_DEPARTMENT_OTHER): Payer: 59

## 2016-01-18 VITALS — BP 113/64 | HR 71 | Temp 99.3°F | Resp 18

## 2016-01-18 DIAGNOSIS — C090 Malignant neoplasm of tonsillar fossa: Secondary | ICD-10-CM

## 2016-01-18 DIAGNOSIS — K1230 Oral mucositis (ulcerative), unspecified: Secondary | ICD-10-CM | POA: Diagnosis not present

## 2016-01-18 MED ORDER — SODIUM CHLORIDE 0.9 % IV SOLN
Freq: Once | INTRAVENOUS | Status: AC
  Administered 2016-01-18: 09:00:00 via INTRAVENOUS

## 2016-01-18 MED ORDER — HEPARIN SOD (PORK) LOCK FLUSH 100 UNIT/ML IV SOLN
500.0000 [IU] | Freq: Once | INTRAVENOUS | Status: AC | PRN
  Administered 2016-01-18: 500 [IU]
  Filled 2016-01-18: qty 5

## 2016-01-18 MED ORDER — SODIUM CHLORIDE 0.9 % IJ SOLN
10.0000 mL | INTRAMUSCULAR | Status: DC | PRN
  Administered 2016-01-18: 10 mL
  Filled 2016-01-18: qty 10

## 2016-01-18 NOTE — Progress Notes (Signed)
  Oncology Nurse Navigator Documentation  Navigator Location: [facility] (01/17/16 3716) Navigator Encounter Type: Treatment (01/17/16 9678)           Patient Visit Type: LFYBOF (01/17/16 7510) Treatment Phase: Active Tx (01/17/16 2585)       Met with Mr. Geiler and his wife prior to today's [place] tmt.  Beth reports:  He is doing better with regime of daily IVF including tomorrow morning, Saturday.  Mr. Coole voiced that he didn't think he was better.  I offered by subjective observation of his improved color, "pep in his step", interaction.  "I must be then" he replied.  Discontinued scopolamine bc it was making him feel "woozy".  He is managing secretions. Beth provided me her FMLA request.  She understands to expect up to a 10 workday TA.  I later delivered papers to [employer] in [place].  Gayleen Orem, RN, BSN, [place] at [place] [phone]                           Time Spent with Patient: 15 (01/17/16 0905)

## 2016-01-18 NOTE — Progress Notes (Signed)
  Oncology Nurse Navigator Documentation  Navigator Location: [facility] (01/13/16 0920) Navigator Encounter Type: Clinic/MDC (01/13/16 0920)           Patient Visit Type: RadOnc (01/13/16 0920) Treatment Phase: Active Tx (01/13/16 0920)       Met with Gregory Jenkins during Weekly Under Treat appointment with Dr. Isidore Moos.  He was accompanied by his wife Corporate treasurer.  He reports:  Concern about being able to complete treatment bco of copious thickened saliva and choking while on tmt table  Dr. Isidore Moos is ordering low-dose Ativan to facilitate relaxation with expectation he will then be able to make it through tmt.  Fentanyl patch is helping with pain control, taking Percocet PRN for additional control. Dr. Isidore Moos revisited suggestion for scopolamine to help with excessive saliva, [name] agreed to try.  Rx issued. Beth noted she will bring FMLA papers later this week. They understand I can be contacted with needs/concerns.  Gayleen Orem, RN, BSN, [place] at [place] [phone]                          Time Spent with Patient: 30 (01/13/16 0920)

## 2016-01-20 ENCOUNTER — Ambulatory Visit
Admission: RE | Admit: 2016-01-20 | Discharge: 2016-01-20 | Disposition: A | Discharge status: Home / Self Care | Payer: 59 | Source: Ambulatory Visit | Attending: Radiation Oncology | Admitting: Radiation Oncology

## 2016-01-20 ENCOUNTER — Encounter: Payer: Self-pay | Admitting: Radiation Oncology

## 2016-01-20 ENCOUNTER — Ambulatory Visit (HOSPITAL_BASED_OUTPATIENT_CLINIC_OR_DEPARTMENT_OTHER): Payer: 59

## 2016-01-20 ENCOUNTER — Encounter: Payer: Self-pay | Admitting: *Deleted

## 2016-01-20 VITALS — BP 92/58 | HR 65 | Temp 98.3°F | Ht 71.5 in | Wt 191.2 lb

## 2016-01-20 DIAGNOSIS — C099 Malignant neoplasm of tonsil, unspecified: Secondary | ICD-10-CM | POA: Diagnosis not present

## 2016-01-20 DIAGNOSIS — C090 Malignant neoplasm of tonsillar fossa: Secondary | ICD-10-CM | POA: Diagnosis not present

## 2016-01-20 DIAGNOSIS — Z923 Personal history of irradiation: Secondary | ICD-10-CM | POA: Insufficient documentation

## 2016-01-20 DIAGNOSIS — K1230 Oral mucositis (ulcerative), unspecified: Secondary | ICD-10-CM | POA: Diagnosis not present

## 2016-01-20 DIAGNOSIS — Z510 Encounter for antineoplastic radiation therapy: Secondary | ICD-10-CM | POA: Diagnosis not present

## 2016-01-20 MED ORDER — SODIUM CHLORIDE 0.9 % IJ SOLN
10.0000 mL | INTRAMUSCULAR | Status: DC | PRN
  Administered 2016-01-20: 10 mL
  Filled 2016-01-20: qty 10

## 2016-01-20 MED ORDER — SONAFINE EX EMUL
1.0000 "application " | Freq: Once | CUTANEOUS | Status: AC
  Administered 2016-01-20: 1 via TOPICAL
  Filled 2016-01-20: qty 45

## 2016-01-20 MED ORDER — SODIUM CHLORIDE 0.9 % IV SOLN
Freq: Once | INTRAVENOUS | Status: AC
  Administered 2016-01-20: 11:00:00 via INTRAVENOUS

## 2016-01-20 MED ORDER — HEPARIN SOD (PORK) LOCK FLUSH 100 UNIT/ML IV SOLN
500.0000 [IU] | Freq: Once | INTRAVENOUS | Status: AC | PRN
  Administered 2016-01-20: 500 [IU]
  Filled 2016-01-20: qty 5

## 2016-01-20 NOTE — Progress Notes (Signed)
   Weekly Management Note:  Outpatient    ICD-9-CM ICD-10-CM   1. Tonsil cancer (HCC) 146.0 C09.9 SONAFINE emulsion 1 application  2. Carcinoma of tonsillar fossa ([facility]CC) 146.1 C09.0     Current Dose:  50 Gy  Projected Dose: 70 Gy   Narrative:  The patient presents for routine under treatment assessment.  CBCT/MVCT images/Port film x-rays were reviewed.  The chart was checked.   Doing  Much better. Denies pain except when swallowing. Decided against scopolamine.  Getting IV fluids daily.  Gag reflex is better.  May resume Erbitux this week - ? Dose for next infusion - was getting half dose.  Tube feeds are going well.  Physical Findings:  Wt Readings from Last 3 Encounters:  01/20/16 191 lb 3.2 oz (86.728 kg)  01/14/16 190 lb 3.2 oz (86.274 kg)  01/13/16 187 lb (84.823 kg)    height is 5' 11.5" (1.816 m) and weight is 191 lb 3.2 oz (86.728 kg). His temperature is 98.3 F (36.8 C). His blood pressure is 92/58 and his pulse is 65. His oxygen saturation is 98%.   Skin dark, erythematous and dry. Small patch of moist desquamation.  Erythema in oropharynx with thick saliva/patchy mucositis. No thrush.      CBC    Component Value Date/Time   WBC 6.0 01/14/2016 0818   WBC 6.8 12/12/2015 1317   RBC 4.53 01/14/2016 0818   RBC 4.63 12/12/2015 1317   HGB 14.1 01/14/2016 0818   HGB 14.4 12/12/2015 1317   HCT 42.5 01/14/2016 0818   HCT 43.5 12/12/2015 1317   PLT 164 01/14/2016 0818   PLT 213 12/12/2015 1317   MCV 93.9 01/14/2016 0818   MCV 94.0 12/12/2015 1317   MCH 31.0 01/14/2016 0818   MCH 31.1 12/12/2015 1317   MCHC 33.1 01/14/2016 0818   MCHC 33.1 12/12/2015 1317   RDW 13.4 01/14/2016 0818   RDW 12.9 12/12/2015 1317   LYMPHSABS 0.2* 01/14/2016 0818   LYMPHSABS 1.6 12/12/2015 1317   MONOABS 0.7 01/14/2016 0818   MONOABS 0.5 12/12/2015 1317   EOSABS 0.2 01/14/2016 0818   EOSABS 0.1 12/12/2015 1317   BASOSABS 0.0 01/14/2016 0818   BASOSABS 0.0 12/12/2015 1317     CMP       Component Value Date/Time   NA 140 01/14/2016 0818   NA 136 12/12/2015 1317   K 4.1 01/14/2016 0818   K 3.9 12/12/2015 1317   CL 103 12/12/2015 1317   CO2 31* 01/14/2016 0818   CO2 24 12/12/2015 1317   GLUCOSE 114 01/14/2016 0818   GLUCOSE 91 12/12/2015 1317   BUN 14.4 01/14/2016 0818   BUN 17 12/12/2015 1317   CREATININE 0.9 01/14/2016 0818   CREATININE 1.11 12/12/2015 1317   CALCIUM 9.6 01/14/2016 0818   CALCIUM 9.0 12/12/2015 1317   PROT 7.2 01/14/2016 0818   PROT 7.0 09/24/2009 1331   ALBUMIN 3.1* 01/14/2016 0818   ALBUMIN 4.1 09/24/2009 1331   AST 13 01/14/2016 0818   AST 18 09/24/2009 1331   ALT 15 01/14/2016 0818   ALT 40 09/24/2009 1331   ALKPHOS 72 01/14/2016 0818   ALKPHOS 123* 09/24/2009 1331   BILITOT 0.44 01/14/2016 0818   BILITOT 0.3 09/24/2009 1331   GFRNONAA >60 12/12/2015 1317   GFRAA >60 12/12/2015 1317    Impression:  The patient is tolerating radiotherapy.    Plan:  Continue radiotherapy as planned.    Neosporin over moist peeling area -----------------------------------  Eppie Gibson, MD

## 2016-01-20 NOTE — Progress Notes (Signed)
  Oncology Nurse Navigator Documentation  Navigator Location: [facility] (01/20/16 0915) Navigator Encounter Type: Treatment (01/20/16 0915)           Patient Visit Type: RadOnc (01/20/16 0915) Treatment Phase: Active Tx (01/20/16 0915)         Met with Mr. Friesen and his wife during [place] with Dr. Isidore Moos.  He has responded exceptionally well to last week's daily IVF (including Saturday).  Subjectively, he is much more alert, interactive, sense of humor has returned.    They report:  Instilling 4-5 cans supplement daily with smaller amounts at more frequent intervals.  In absence of IVF on Sunday, instilled 3-4 16 oz bottles of water.  Saliva has thinned since beginning IVF, not as thick and problematic as before.  Able to expectorate easily.   Fentanyl patch continues to provide adequate pain control.  Applying Sonafine to treatment area 2-3 times daily.  He was advised to apply Neosporin to the couple areas of broken skin on his neck.  I provided them written information for the upcoming 4/17 [facility] and 4/25 [facility] which I had previously discussed with them, explained benefits, encouraged them to attend.  They expressed interest.  They did not express any needs or concerns at this time, I encouraged them to contact me if that changes, they verbalized understanding.  Gayleen Orem, RN, BSN, [place] at [place] [phone]                         Time Spent with Patient: 75 (01/20/16 0915)

## 2016-01-20 NOTE — Progress Notes (Signed)
Mr. Vogt is here for his 25th fraction of radiation to his Left Tonsil and bilateral neck. He is instilling 5 cans of osmolite daily through his PEG tube. He is also instilling 32 ounces of free water daily. He is receiving IV fluids 6 days a week in Medical Oncology for the next 2 weeks as scheduled. He is only taking water and baking soda rinses orally. The skin to his neck is dry, peeling. He does have area of moist desquamation. He is using the sonafine cream as directed. I have advised neosporin to the moist areas. His mouth is dry, with thick saliva present. He reports regular bowel movements. He does admit to some fatigue, but is still able to get out as needed.   BP 92/58 mmHg  Pulse 65  Temp(Src) 98.3 F (36.8 C)  Ht 5' 11.5" (1.816 m)  Wt 191 lb 3.2 oz (86.728 kg)  BMI 26.30 kg/m2  SpO2 98%  Orthostatics: sitting BP 102/59, pulse 62. Standing BP 92/58, pulse 65.

## 2016-01-21 ENCOUNTER — Other Ambulatory Visit (HOSPITAL_BASED_OUTPATIENT_CLINIC_OR_DEPARTMENT_OTHER): Payer: 59

## 2016-01-21 ENCOUNTER — Encounter: Payer: Self-pay | Admitting: Hematology and Oncology

## 2016-01-21 ENCOUNTER — Ambulatory Visit
Admission: RE | Admit: 2016-01-21 | Discharge: 2016-01-21 | Disposition: A | Discharge status: Home / Self Care | Payer: 59 | Source: Ambulatory Visit | Attending: Radiation Oncology | Admitting: Radiation Oncology

## 2016-01-21 ENCOUNTER — Ambulatory Visit (HOSPITAL_BASED_OUTPATIENT_CLINIC_OR_DEPARTMENT_OTHER): Payer: 59 | Admitting: Hematology and Oncology

## 2016-01-21 ENCOUNTER — Encounter: Payer: Self-pay | Admitting: Radiation Oncology

## 2016-01-21 ENCOUNTER — Ambulatory Visit: Payer: 59

## 2016-01-21 ENCOUNTER — Ambulatory Visit (HOSPITAL_BASED_OUTPATIENT_CLINIC_OR_DEPARTMENT_OTHER): Payer: 59

## 2016-01-21 VITALS — BP 104/62 | HR 65 | Temp 98.7°F | Resp 18 | Ht 71.5 in | Wt 189.6 lb

## 2016-01-21 VITALS — BP 109/50 | HR 64

## 2016-01-21 DIAGNOSIS — C090 Malignant neoplasm of tonsillar fossa: Secondary | ICD-10-CM | POA: Diagnosis not present

## 2016-01-21 DIAGNOSIS — E441 Mild protein-calorie malnutrition: Secondary | ICD-10-CM | POA: Diagnosis not present

## 2016-01-21 DIAGNOSIS — K1230 Oral mucositis (ulcerative), unspecified: Secondary | ICD-10-CM

## 2016-01-21 DIAGNOSIS — L270 Generalized skin eruption due to drugs and medicaments taken internally: Secondary | ICD-10-CM | POA: Diagnosis not present

## 2016-01-21 DIAGNOSIS — C099 Malignant neoplasm of tonsil, unspecified: Secondary | ICD-10-CM

## 2016-01-21 DIAGNOSIS — Z95828 Presence of other vascular implants and grafts: Secondary | ICD-10-CM

## 2016-01-21 DIAGNOSIS — Z510 Encounter for antineoplastic radiation therapy: Secondary | ICD-10-CM | POA: Diagnosis not present

## 2016-01-21 LAB — MAGNESIUM: Magnesium: 2.2 mg/dl (ref 1.5–2.5)

## 2016-01-21 LAB — CBC WITH DIFFERENTIAL/PLATELET
BASO%: 0.2 % (ref 0.0–2.0)
Basophils Absolute: 0 10*3/uL (ref 0.0–0.1)
EOS ABS: 0.1 10*3/uL (ref 0.0–0.5)
EOS%: 2.3 % (ref 0.0–7.0)
HCT: 41.2 % (ref 38.4–49.9)
HGB: 14 g/dL (ref 13.0–17.1)
LYMPH%: 3 % — AB (ref 14.0–49.0)
MCH: 31.8 pg (ref 27.2–33.4)
MCHC: 33.9 g/dL (ref 32.0–36.0)
MCV: 93.9 fL (ref 79.3–98.0)
MONO#: 0.5 10*3/uL (ref 0.1–0.9)
MONO%: 9.7 % (ref 0.0–14.0)
NEUT%: 84.8 % — ABNORMAL HIGH (ref 39.0–75.0)
NEUTROS ABS: 4.8 10*3/uL (ref 1.5–6.5)
PLATELETS: 186 10*3/uL (ref 140–400)
RBC: 4.39 10*6/uL (ref 4.20–5.82)
RDW: 13.4 % (ref 11.0–14.6)
WBC: 5.6 10*3/uL (ref 4.0–10.3)
lymph#: 0.2 10*3/uL — ABNORMAL LOW (ref 0.9–3.3)

## 2016-01-21 LAB — COMPREHENSIVE METABOLIC PANEL
ALBUMIN: 3 g/dL — AB (ref 3.5–5.0)
ALK PHOS: 70 U/L (ref 40–150)
ALT: 14 U/L (ref 0–55)
ANION GAP: 8 meq/L (ref 3–11)
AST: 15 U/L (ref 5–34)
BILIRUBIN TOTAL: 0.46 mg/dL (ref 0.20–1.20)
BUN: 10.6 mg/dL (ref 7.0–26.0)
CO2: 28 meq/L (ref 22–29)
Calcium: 9.3 mg/dL (ref 8.4–10.4)
Chloride: 103 mEq/L (ref 98–109)
Creatinine: 0.9 mg/dL (ref 0.7–1.3)
Glucose: 98 mg/dl (ref 70–140)
Potassium: 4.5 mEq/L (ref 3.5–5.1)
Sodium: 138 mEq/L (ref 136–145)
TOTAL PROTEIN: 7 g/dL (ref 6.4–8.3)

## 2016-01-21 MED ORDER — HEPARIN SOD (PORK) LOCK FLUSH 100 UNIT/ML IV SOLN
500.0000 [IU] | Freq: Once | INTRAVENOUS | Status: AC | PRN
  Administered 2016-01-21: 500 [IU]
  Filled 2016-01-21: qty 5

## 2016-01-21 MED ORDER — SODIUM CHLORIDE 0.9 % IJ SOLN
10.0000 mL | INTRAMUSCULAR | Status: DC | PRN
  Administered 2016-01-21: 10 mL
  Filled 2016-01-21: qty 10

## 2016-01-21 MED ORDER — FENTANYL 25 MCG/HR TD PT72: 25.0000 ug | MEDICATED_PATCH | TRANSDERMAL | Status: DC

## 2016-01-21 MED ORDER — SODIUM CHLORIDE 0.9% FLUSH
10.0000 mL | INTRAVENOUS | Status: DC | PRN
  Administered 2016-01-21: 10 mL via INTRAVENOUS
  Filled 2016-01-21: qty 10

## 2016-01-21 MED ORDER — SODIUM CHLORIDE 0.9 % IV SOLN
Freq: Once | INTRAVENOUS | Status: AC
  Administered 2016-01-21: 11:00:00 via INTRAVENOUS

## 2016-01-21 MED ORDER — SODIUM CHLORIDE 0.9 % IV SOLN
Freq: Once | INTRAVENOUS | Status: AC
  Administered 2016-01-21: 11:00:00 via INTRAVENOUS
  Filled 2016-01-21: qty 4

## 2016-01-21 MED FILL — fentaNYL 25 MCG/HR PT72: 25 | 15 days supply | Qty: 5 | Fill #0

## 2016-01-21 NOTE — Progress Notes (Signed)
Paperwork received from doctor, faxed to [facility] Elmyra Ricks @ [phone], confirmation receive, copy given to pt, 3/28 Ardeen Fillers)

## 2016-01-21 NOTE — Assessment & Plan Note (Signed)
He is struggling with oral intake and has lost weight but overall feeling better since we started him on IV fluid hydration therapy He is 100% dependent on feeding tube now I recommend IV fluid support daily and to focus on nutritional supplement through the feeding tube

## 2016-01-21 NOTE — Assessment & Plan Note (Addendum)
He has grade 1 skin toxicity from cetuximab and radiation I recommend he continues prescription hydrocortisone and topical antibiotic cream. I will reassess next week

## 2016-01-21 NOTE — Progress Notes (Signed)
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 Mild asymmetry of the left tonsil -- correlate with direct visualization to assess for primary neoplasm.   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   11/28/2015 PET scan 1. Left palatine tonsil primary with left worse than right cervical nodal metastasis. 2. No extracervical hypermetabolic metastasis.    12/12/2015 Procedure Peg and port-a-cath placement.   12/18/2015 -  Chemotherapy He received weekly cetuximab   12/18/2015 -  Radiation Therapy He received concurrent radiation   12/31/2015 Adverse Reaction Treatment #3 is placed on hold due to worsening mucositis   01/08/2016 Miscellaneous Cetuximab #3 resumed with 50% dose adjustment   01/14/2016 Adverse Reaction Treatment #4 is placed on hold due to worsening mucositis and failure to thrive    INTERVAL HISTORY: Please see below for problem oriented charting. He is seen prior to scheduled chemotherapy tomorrow Since we started him on IV fluid hydration therapy, he is improving overall. He is able to tolerate 5 cans of nutritional supplement. He has less mucus gagging in his throat. His pain is under control, currently rated his throat pain at 3 out of 10. He denies recent nausea  or constipation. He is using topical cream around his neck.  REVIEW OF SYSTEMS:   Constitutional: Denies fevers, chills or abnormal weight loss Eyes: Denies blurriness of vision Respiratory: Denies cough, dyspnea or wheezes Cardiovascular: Denies palpitation, chest discomfort or lower extremity swelling Gastrointestinal:  Denies nausea, heartburn or change in bowel habits Lymphatics: Denies new lymphadenopathy or easy bruising Neurological:Denies numbness, tingling or new weaknesses Behavioral/Psych: Mood is stable, no new changes  All other systems were reviewed with the patient and are negative.  I have reviewed the past medical history, past surgical history, social history and family history with the patient and they are unchanged from previous note.  ALLERGIES:  has No Known Allergies.  MEDICATIONS:  Current Outpatient Prescriptions  Medication Sig Dispense Refill  . aspirin EC 81 MG tablet Take 81 mg by mouth daily.    . clindamycin (CLINDAGEL) 1 % gel Apply topically 2 (two) times daily. 30 g 0  . fentaNYL (DURAGESIC - DOSED MCG/HR) 25 MCG/HR patch Place 1 patch (25 mcg total) onto the skin every 3 (three) days. 5 patch 0  . hydrocortisone 1 % ointment Apply 1 application topically 2 (two) times daily. 30 g 0  . ibuprofen (ADVIL,MOTRIN) 200 MG tablet Take 200 mg by mouth as needed. Reported on 01/14/2016    . levothyroxine (SYNTHROID, LEVOTHROID) 75 MCG tablet Take 75 mcg by mouth daily before breakfast.    . lidocaine-prilocaine (EMLA) cream Apply to affected area once 30 g 3  . LORazepam (ATIVAN) 0.5 MG tablet Take 1 tablet 20-30 minutes prior to treatment. 20 tablet 0  . losartan (COZAAR) 25 MG tablet Take 25 mg by mouth every morning. Reported on 01/21/2016  2  . Nutritional Supplements (FEEDING SUPPLEMENT, OSMOLITE 1.5 CAL,) LIQD Begin one can Osmolite 1.5, or equivalent, QID with 30 cc free water before and 60 cc free water after. On day 3, increase to 1 1/2 cans BID and  continue 2 cans BID as tolerated.On day 5, increase to 1 1/2 cans QID as tolerated.On day 7, increase to goal rate of 1 1/2 cans TID and two cans once for total of 6.5 cans daily.Flush tube or drink addition 32 oz water daily.Send TF and supplies. 1541 mL 4  . ondansetron (ZOFRAN) 8 MG tablet Take 1 tablet (8 mg total) by mouth every 8 (eight) hours as needed for nausea. 30 tablet 3  . ranitidine (ZANTAC) 150 MG capsule Take 150 mg by mouth 2 (two) times daily.    . sodium fluoride (FLUORISHIELD) 1.1 % GEL dental gel Instill one drop of gel per tooth space of fluoride tray. Place over teeth for 5 minutes. Remove. Spit out excess. Repeat nightly. 120 mL PRN  . lidocaine (XYLOCAINE) 2 % solution Mix 1 part 2%viscous lidocaine,1part H2O.Swish and/or swallow 96mL of this mixture,76min before meals and at bedtime, up to QID (Patient not taking: Reported on 01/20/2016) 100 mL 5  . morphine (ROXANOL) 20 MG/ML concentrated solution Place 0.5 mLs (10 mg total) into feeding tube every 2 (two) hours as needed for severe pain. (Patient not taking: Reported on 01/13/2016) 240 mL 0  . oxyCODONE-acetaminophen (PERCOCET/ROXICET) 5-325 MG tablet Take 1 tablet by mouth every 4 (four) hours as needed for moderate pain. Reported on 12/25/2015 (Patient not taking: Reported on 01/13/2016) 60 tablet 0  . promethazine (PHENERGAN) 25 MG tablet Take 1 tablet (25 mg total) by mouth every 6 (six) hours as needed for nausea. (Patient not taking: Reported on 12/31/2015) 30 tablet 3  . scopolamine (TRANSDERM-SCOP) 1 MG/3DAYS Place 1 patch (1.5 mg total) onto the skin every 3 (three) days. (Patient not taking: Reported on 01/20/2016) 10 patch 1  . sucralfate (CARAFATE) 1 g tablet Dissolve 1 tablet in 21mL H2O and swallow up to QID,PRN sore throat. (Patient not taking: Reported on 01/13/2016) 60 tablet 5   No current facility-administered medications for this visit.    PHYSICAL EXAMINATION: ECOG PERFORMANCE STATUS: 1 - Symptomatic but  completely ambulatory  Filed Vitals:   01/21/16 1013  BP: 104/62  Pulse: 65  Temp: 98.7 F (37.1 C)  Resp: 18   Filed Weights   01/21/16 1013  Weight: 189 lb 9.6 oz (86.002 kg)    GENERAL:alert, no distress and comfortable SKIN: He has significant skin rash around his neck. No open sores EYES: normal, Conjunctiva are pink and non-injected, sclera clear OROPHARYNX: He has persistent mucositis. No thrush NECK: supple, thyroid normal size, non-tender, without nodularity LYMPH:  no palpable lymphadenopathy in the cervical, axillary or inguinal LUNGS: clear to auscultation and percussion with normal breathing effort HEART: regular rate & rhythm and no murmurs and no lower extremity edema ABDOMEN:abdomen soft, non-tender and normal bowel sounds. Feeding tube site looks okay Musculoskeletal:no cyanosis of digits and no clubbing  NEURO: alert & oriented x 3 with fluent speech, no focal motor/sensory deficits  LABORATORY DATA:  I have reviewed the data as listed    Component Value Date/Time   NA 138 01/21/2016 0933   NA 136 12/12/2015 1317   K 4.5 01/21/2016 0933   K 3.9 12/12/2015 1317   CL 103 12/12/2015 1317   CO2 28 01/21/2016 0933   CO2 24 12/12/2015 1317  GLUCOSE 98 01/21/2016 0933   GLUCOSE 91 12/12/2015 1317   BUN 10.6 01/21/2016 0933   BUN 17 12/12/2015 1317   CREATININE 0.9 01/21/2016 0933   CREATININE 1.11 12/12/2015 1317   CALCIUM 9.3 01/21/2016 0933   CALCIUM 9.0 12/12/2015 1317   PROT 7.0 01/21/2016 0933   PROT 7.0 09/24/2009 1331   ALBUMIN 3.0* 01/21/2016 0933   ALBUMIN 4.1 09/24/2009 1331   AST 15 01/21/2016 0933   AST 18 09/24/2009 1331   ALT 14 01/21/2016 0933   ALT 40 09/24/2009 1331   ALKPHOS 70 01/21/2016 0933   ALKPHOS 123* 09/24/2009 1331   BILITOT 0.46 01/21/2016 0933   BILITOT 0.3 09/24/2009 1331   GFRNONAA >60 12/12/2015 1317   GFRAA >60 12/12/2015 1317    No results found for: SPEP, UPEP  Lab Results  Component Value Date   WBC 5.6  01/21/2016   NEUTROABS 4.8 01/21/2016   HGB 14.0 01/21/2016   HCT 41.2 01/21/2016   MCV 93.9 01/21/2016   PLT 186 01/21/2016      Chemistry      Component Value Date/Time   NA 138 01/21/2016 0933   NA 136 12/12/2015 1317   K 4.5 01/21/2016 0933   K 3.9 12/12/2015 1317   CL 103 12/12/2015 1317   CO2 28 01/21/2016 0933   CO2 24 12/12/2015 1317   BUN 10.6 01/21/2016 0933   BUN 17 12/12/2015 1317   CREATININE 0.9 01/21/2016 0933   CREATININE 1.11 12/12/2015 1317      Component Value Date/Time   CALCIUM 9.3 01/21/2016 0933   CALCIUM 9.0 12/12/2015 1317   ALKPHOS 70 01/21/2016 0933   ALKPHOS 123* 09/24/2009 1331   AST 15 01/21/2016 0933   AST 18 09/24/2009 1331   ALT 14 01/21/2016 0933   ALT 40 09/24/2009 1331   BILITOT 0.46 01/21/2016 0933   BILITOT 0.3 09/24/2009 1331     ASSESSMENT & PLAN:  Tonsil cancer ([place]) He is doing better after we started him on frequent IV fluid hydration therapy. I will resume cetuximab tomorrow at 50% dose. I will continue to see him on a weekly basis for supportive care  Drug-induced skin rash He has grade 1 skin toxicity from cetuximab and radiation I recommend he continues prescription hydrocortisone and topical antibiotic cream. I will reassess next week  Mucositis oral He has grade 2 mucositis  His pain is well-controlled with current dose of fentanyl and I refill his prescription I will see him next week for further assessment    Protein-calorie malnutrition, mild ([place]) He is struggling with oral intake and has lost weight but overall feeling better since we started him on IV fluid hydration therapy He is 100% dependent on feeding tube now I recommend IV fluid support daily and to focus on nutritional supplement through the feeding tube       No orders of the defined types were placed in this encounter.   All questions were answered. The patient knows to call the clinic with any problems, questions or concerns. No  barriers to learning was detected. I spent 25 minutes counseling the patient face to face. The total time spent in the appointment was 30 minutes and more than 50% was on counseling and review of test results     [facility], Winn Muehl, MD 01/21/2016 11:24 AM

## 2016-01-21 NOTE — Assessment & Plan Note (Signed)
He has grade 2 mucositis  His pain is well-controlled with current dose of fentanyl and I refill his prescription I will see him next week for further assessment

## 2016-01-21 NOTE — Patient Instructions (Signed)

## 2016-01-21 NOTE — Patient Instructions (Signed)

## 2016-01-21 NOTE — Assessment & Plan Note (Signed)
He is doing better after we started him on frequent IV fluid hydration therapy. I will resume cetuximab tomorrow at 50% dose. I will continue to see him on a weekly basis for supportive care

## 2016-01-22 ENCOUNTER — Ambulatory Visit
Admission: RE | Admit: 2016-01-22 | Discharge: 2016-01-22 | Disposition: A | Discharge status: Home / Self Care | Payer: 59 | Source: Ambulatory Visit | Attending: Radiation Oncology | Admitting: Radiation Oncology

## 2016-01-22 ENCOUNTER — Encounter: Payer: Self-pay | Admitting: Nutrition

## 2016-01-22 ENCOUNTER — Ambulatory Visit (HOSPITAL_BASED_OUTPATIENT_CLINIC_OR_DEPARTMENT_OTHER): Payer: 59

## 2016-01-22 ENCOUNTER — Ambulatory Visit: Payer: 59 | Admitting: Nutrition

## 2016-01-22 VITALS — BP 98/58 | HR 58 | Temp 98.5°F | Resp 18

## 2016-01-22 DIAGNOSIS — Z510 Encounter for antineoplastic radiation therapy: Secondary | ICD-10-CM | POA: Diagnosis not present

## 2016-01-22 DIAGNOSIS — Z5112 Encounter for antineoplastic immunotherapy: Secondary | ICD-10-CM

## 2016-01-22 DIAGNOSIS — C099 Malignant neoplasm of tonsil, unspecified: Secondary | ICD-10-CM | POA: Diagnosis not present

## 2016-01-22 DIAGNOSIS — K1230 Oral mucositis (ulcerative), unspecified: Secondary | ICD-10-CM

## 2016-01-22 DIAGNOSIS — C090 Malignant neoplasm of tonsillar fossa: Secondary | ICD-10-CM

## 2016-01-22 MED ORDER — SODIUM CHLORIDE 0.9 % IV SOLN
Freq: Once | INTRAVENOUS | Status: AC
  Administered 2016-01-22: 09:00:00 via INTRAVENOUS

## 2016-01-22 MED ORDER — CETUXIMAB CHEMO IV INJECTION 200 MG/100ML
125.0000 mg/m2 | Freq: Once | INTRAVENOUS | Status: AC
  Administered 2016-01-22: 300 mg via INTRAVENOUS
  Filled 2016-01-22: qty 100

## 2016-01-22 MED ORDER — DIPHENHYDRAMINE HCL 50 MG/ML IJ SOLN
INTRAMUSCULAR | Status: AC
  Filled 2016-01-22: qty 1

## 2016-01-22 MED ORDER — DIPHENHYDRAMINE HCL 50 MG/ML IJ SOLN
50.0000 mg | Freq: Once | INTRAMUSCULAR | Status: AC
  Administered 2016-01-22: 50 mg via INTRAVENOUS

## 2016-01-22 MED ORDER — HEPARIN SOD (PORK) LOCK FLUSH 100 UNIT/ML IV SOLN
500.0000 [IU] | Freq: Once | INTRAVENOUS | Status: AC | PRN
  Administered 2016-01-22: 500 [IU]
  Filled 2016-01-22: qty 5

## 2016-01-22 MED ORDER — SODIUM CHLORIDE 0.9% FLUSH
10.0000 mL | INTRAVENOUS | Status: DC | PRN
  Administered 2016-01-22: 10 mL
  Filled 2016-01-22: qty 10

## 2016-01-22 MED ORDER — SODIUM CHLORIDE 0.9 % IV SOLN
Freq: Once | INTRAVENOUS | Status: AC
  Administered 2016-01-22: 09:00:00 via INTRAVENOUS
  Filled 2016-01-22: qty 4

## 2016-01-22 NOTE — Progress Notes (Signed)
Nutrition follow-up completed with patient and wife, during infusion for tonsil cancer. Patient reports that he has felt the best he has last 2 weeks. IV fluids have really helped patient to tolerate tube feeding better. Patient is tolerating approximately 4-1/2 -5-1/2 cans of Osmolite 1.5 per day, just short of goal rate of 6-1/2 cans daily. Patient is not eating by mouth. Reports bowel movement every other day. Nausea is controlled. Current weight 189.6 pounds decreased from 197.4 pounds March 1.  Nutrition diagnosis: Inadequate oral intake continues.  Estimated nutrition needs: 2150-2350 calories, 104-115 grams protein, 2.3 L fluid.  6.5 cans Osmolite 1.5 provides 2380 cal, 97 g protein, 1177 mL free water.  Intervention: Reviewed tube feeding schedule with patient's wife and encouraged her to work to increase tube feedings to one half can prior radiation therapy +1-1/2 cans 4 times a day throughout the rest of the day. Patient will continue to receive IV fluids to provide additional free water. Teach back method used.  Monitoring, evaluation, goals: Patient will tolerate increased tube feedings to meet greater than 90% of estimated needs.  Next visit: Wednesday, April 5, during infusion.  **Disclaimer: This note was dictated with voice recognition software. Similar sounding words can inadvertently be transcribed and this note may contain transcription errors which may not have been corrected upon publication of note.**

## 2016-01-22 NOTE — Progress Notes (Signed)
Per Vergia Alcon, PharD pt to remain at 300mg  Erbitux dose despite current BSA calculations. Vergia Alcon has discussed with Dr. Alvy Bimler.

## 2016-01-22 NOTE — Patient Instructions (Signed)
[facility] Discharge Instructions for Patients Receiving Chemotherapy  Today you received the following chemotherapy agent, Erbitux.  To help prevent nausea and vomiting after your treatment, we encourage you to take your nausea medication as prescribed.  If you develop nausea and vomiting that is not controlled by your nausea medication, call the clinic.   BELOW ARE SYMPTOMS THAT SHOULD BE REPORTED IMMEDIATELY:  *FEVER GREATER THAN 100.5 F  *CHILLS WITH OR WITHOUT FEVER  NAUSEA AND VOMITING THAT IS NOT CONTROLLED WITH YOUR NAUSEA MEDICATION  *UNUSUAL SHORTNESS OF BREATH  *UNUSUAL BRUISING OR BLEEDING  TENDERNESS IN MOUTH AND THROAT WITH OR WITHOUT PRESENCE OF ULCERS  *URINARY PROBLEMS  *BOWEL PROBLEMS  UNUSUAL RASH Items with * indicate a potential emergency and should be followed up as soon as possible.  Feel free to call the clinic you have any questions or concerns. The clinic phone number is [phone].  Please show the [facility] at check-in to the Emergency Department and triage nurse.  Dehydration, Adult Dehydration is a condition in which you do not have enough fluid or water in your body. It happens when you take in less fluid than you lose. Vital organs such as the kidneys, brain, and heart cannot function without a proper amount of fluids. Any loss of fluids from the body can cause dehydration.  Dehydration can range from mild to severe. This condition should be treated right away to help prevent it from becoming severe. CAUSES  This condition may be caused by:  Vomiting.  Diarrhea.  Excessive sweating, such as when exercising in hot or humid weather.  Not drinking enough fluid during strenuous exercise or during an illness.  Excessive urine output.  Fever.  Certain medicines. RISK FACTORS This condition is more likely to develop in:  People who are taking certain medicines that cause the body to lose excess fluid  (diuretics).   People who have a chronic illness, such as diabetes, that may increase urination.  Older adults.   People who live at high altitudes.   People who participate in endurance sports.  SYMPTOMS  Mild Dehydration  Thirst.  Dry lips.  Slightly dry mouth.  Dry, warm skin. Moderate Dehydration  Very dry mouth.   Muscle cramps.   Dark urine and decreased urine production.   Decreased tear production.   Headache.   Light-headedness, especially when you stand up from a sitting position.  Severe Dehydration  Changes in skin.   Cold and clammy skin.   Skin does not spring back quickly when lightly pinched and released.   Changes in body fluids.   Extreme thirst.   No tears.   Not able to sweat when body temperature is high, such as in hot weather.   Minimal urine production.   Changes in vital signs.   Rapid, weak pulse (more than 100 beats per minute when you are sitting still).   Rapid breathing.   Low blood pressure.   Other changes.   Sunken eyes.   Cold hands and feet.   Confusion.  Lethargy and difficulty being awakened.  Fainting (syncope).   Short-term weight loss.   Unconsciousness. DIAGNOSIS  This condition may be diagnosed based on your symptoms. You may also have tests to determine how severe your dehydration is. These tests may include:   Urine tests.   Blood tests.  TREATMENT  Treatment for this condition depends on the severity. Mild or moderate dehydration can often be treated at home. Treatment should  be started right away. Do not wait until dehydration becomes severe. Severe dehydration needs to be treated at the hospital. Treatment for Mild Dehydration  Drinking plenty of water to replace the fluid you have lost.   Replacing minerals in your blood (electrolytes) that you may have lost.  Treatment for Moderate Dehydration  Consuming oral rehydration solution (ORS). Treatment  for Severe Dehydration  Receiving fluid through an IV tube.   Receiving electrolyte solution through a feeding tube that is passed through your nose and into your stomach (nasogastric tube or NG tube).  Correcting any abnormalities in electrolytes. HOME CARE INSTRUCTIONS   Drink enough fluid to keep your urine clear or pale yellow.   Drink water or fluid slowly by taking small sips. You can also try sucking on ice cubes.  Have food or beverages that contain electrolytes. Examples include bananas and sports drinks.  Take over-the-counter and prescription medicines only as told by your health care provider.   Prepare ORS according to the manufacturer's instructions. Take sips of ORS every 5 minutes until your urine returns to normal.  If you have vomiting or diarrhea, continue to try to drink water, ORS, or both.   If you have diarrhea, avoid:   Beverages that contain caffeine.   Fruit juice.   Milk.   Carbonated soft drinks.  Do not take salt tablets. This can lead to the condition of having too much sodium in your body (hypernatremia).  SEEK MEDICAL CARE IF:  You cannot eat or drink without vomiting.  You have had moderate diarrhea during a period of more than 24 hours.  You have a fever. SEEK IMMEDIATE MEDICAL CARE IF:   You have extreme thirst.  You have severe diarrhea.  You have not urinated in 6-8 hours, or you have urinated only a small amount of very dark urine.  You have shriveled skin.  You are dizzy, confused, or both.   This information is not intended to replace advice given to you by your health care provider. Make sure you discuss any questions you have with your health care provider.   Document Released: 10/12/2005 Document Revised: 07/03/2015 Document Reviewed: 02/27/2015 Elsevier Interactive Patient Education [employer].

## 2016-01-23 ENCOUNTER — Ambulatory Visit
Admission: RE | Admit: 2016-01-23 | Discharge: 2016-01-23 | Disposition: A | Discharge status: Home / Self Care | Payer: 59 | Source: Ambulatory Visit | Attending: Radiation Oncology | Admitting: Radiation Oncology

## 2016-01-23 ENCOUNTER — Ambulatory Visit (HOSPITAL_BASED_OUTPATIENT_CLINIC_OR_DEPARTMENT_OTHER): Payer: 59

## 2016-01-23 VITALS — BP 110/47 | HR 64 | Temp 98.8°F | Resp 18

## 2016-01-23 DIAGNOSIS — K1230 Oral mucositis (ulcerative), unspecified: Secondary | ICD-10-CM | POA: Diagnosis not present

## 2016-01-23 DIAGNOSIS — Z510 Encounter for antineoplastic radiation therapy: Secondary | ICD-10-CM | POA: Diagnosis not present

## 2016-01-23 DIAGNOSIS — C090 Malignant neoplasm of tonsillar fossa: Secondary | ICD-10-CM | POA: Diagnosis not present

## 2016-01-23 MED ORDER — SODIUM CHLORIDE 0.9 % IJ SOLN
10.0000 mL | INTRAMUSCULAR | Status: DC | PRN
  Administered 2016-01-23: 10 mL
  Filled 2016-01-23: qty 10

## 2016-01-23 MED ORDER — HEPARIN SOD (PORK) LOCK FLUSH 100 UNIT/ML IV SOLN
500.0000 [IU] | Freq: Once | INTRAVENOUS | Status: AC | PRN
  Administered 2016-01-23: 500 [IU]
  Filled 2016-01-23: qty 5

## 2016-01-23 MED ORDER — SODIUM CHLORIDE 0.9 % IV SOLN
Freq: Once | INTRAVENOUS | Status: AC
  Administered 2016-01-23: 10:00:00 via INTRAVENOUS

## 2016-01-23 MED ORDER — ONDANSETRON HCL 40 MG/20ML IJ SOLN
Freq: Once | INTRAMUSCULAR | Status: AC
  Administered 2016-01-23: 10:00:00 via INTRAVENOUS
  Filled 2016-01-23: qty 4

## 2016-01-23 NOTE — Patient Instructions (Signed)

## 2016-01-24 ENCOUNTER — Ambulatory Visit
Admission: RE | Admit: 2016-01-24 | Discharge: 2016-01-24 | Disposition: A | Discharge status: Home / Self Care | Payer: 59 | Source: Ambulatory Visit | Attending: Radiation Oncology | Admitting: Radiation Oncology

## 2016-01-24 ENCOUNTER — Ambulatory Visit (HOSPITAL_BASED_OUTPATIENT_CLINIC_OR_DEPARTMENT_OTHER): Payer: 59

## 2016-01-24 VITALS — BP 126/63 | HR 71 | Temp 98.9°F

## 2016-01-24 DIAGNOSIS — C090 Malignant neoplasm of tonsillar fossa: Secondary | ICD-10-CM | POA: Diagnosis not present

## 2016-01-24 DIAGNOSIS — K1230 Oral mucositis (ulcerative), unspecified: Secondary | ICD-10-CM

## 2016-01-24 DIAGNOSIS — Z510 Encounter for antineoplastic radiation therapy: Secondary | ICD-10-CM | POA: Diagnosis not present

## 2016-01-24 MED ORDER — SODIUM CHLORIDE 0.9 % IV SOLN
Freq: Once | INTRAVENOUS | Status: AC
  Administered 2016-01-24: 10:00:00 via INTRAVENOUS
  Filled 2016-01-24: qty 4

## 2016-01-24 MED ORDER — SODIUM CHLORIDE 0.9 % IJ SOLN
10.0000 mL | INTRAMUSCULAR | Status: DC | PRN
  Administered 2016-01-24: 10 mL
  Filled 2016-01-24: qty 10

## 2016-01-24 MED ORDER — HEPARIN SOD (PORK) LOCK FLUSH 100 UNIT/ML IV SOLN
500.0000 [IU] | Freq: Once | INTRAVENOUS | Status: AC | PRN
  Administered 2016-01-24: 500 [IU]
  Filled 2016-01-24: qty 5

## 2016-01-24 MED ORDER — SODIUM CHLORIDE 0.9 % IV SOLN
Freq: Once | INTRAVENOUS | Status: AC
  Administered 2016-01-24: 10:00:00 via INTRAVENOUS

## 2016-01-24 NOTE — Patient Instructions (Signed)
Dehydration, Adult Dehydration is when you lose more fluids from the body than you take in. Vital organs like the kidneys, brain, and heart cannot function without a proper amount of fluids and salt. Any loss of fluids from the body can cause dehydration.  CAUSES   Vomiting.  Diarrhea.  Excessive sweating.  Excessive urine output.  Fever. SYMPTOMS  Mild dehydration  Thirst.  Dry lips.  Slightly dry mouth. Moderate dehydration  Very dry mouth.  Sunken eyes.  Skin does not bounce back quickly when lightly pinched and released.  Dark urine and decreased urine production.  Decreased tear production.  Headache. Severe dehydration  Very dry mouth.  Extreme thirst.  Rapid, weak pulse (more than 100 beats per minute at rest).  Cold hands and feet.  Not able to sweat in spite of heat and temperature.  Rapid breathing.  Blue lips.  Confusion and lethargy.  Difficulty being awakened.  Minimal urine production.  No tears. DIAGNOSIS  Your caregiver will diagnose dehydration based on your symptoms and your exam. Blood and urine tests will help confirm the diagnosis. The diagnostic evaluation should also identify the cause of dehydration. TREATMENT  Treatment of mild or moderate dehydration can often be done at home by increasing the amount of fluids that you drink. It is best to drink small amounts of fluid more often. Drinking too much at one time can make vomiting worse. Refer to the home care instructions below. Severe dehydration needs to be treated at the hospital where you will probably be given intravenous (IV) fluids that contain water and electrolytes. HOME CARE INSTRUCTIONS   Ask your caregiver about specific rehydration instructions.  Drink enough fluids to keep your urine clear or pale yellow.  Drink small amounts frequently if you have nausea and vomiting.  Eat as you normally do.  Avoid:  Foods or drinks high in sugar.  Carbonated  drinks.  Juice.  Extremely hot or cold fluids.  Drinks with caffeine.  Fatty, greasy foods.  Alcohol.  Tobacco.  Overeating.  Gelatin desserts.  Wash your hands well to avoid spreading bacteria and viruses.  Only take over-the-counter or prescription medicines for pain, discomfort, or fever as directed by your caregiver.  Ask your caregiver if you should continue all prescribed and over-the-counter medicines.  Keep all follow-up appointments with your caregiver. SEEK MEDICAL CARE IF:  You have abdominal pain and it increases or stays in one area (localizes).  You have a rash, stiff neck, or severe headache.  You are irritable, sleepy, or difficult to awaken.  You are weak, dizzy, or extremely thirsty. SEEK IMMEDIATE MEDICAL CARE IF:   You are unable to keep fluids down or you get worse despite treatment.  You have frequent episodes of vomiting or diarrhea.  You have blood or green matter (bile) in your vomit.  You have blood in your stool or your stool looks black and tarry.  You have not urinated in 6 to 8 hours, or you have only urinated a small amount of very dark urine.  You have a fever.  You faint. MAKE SURE YOU:   Understand these instructions.  Will watch your condition.  Will get help right away if you are not doing well or get worse. Document Released: 10/12/2005 Document Revised: 01/04/2012 Document Reviewed: 06/01/2011 Exit[employer] Patient Information 2015 [employer]. This information is not intended to replace advice given to you by your health care provider. Make sure you discuss any questions you have with your health care   provider.  Nausea and Vomiting Nausea is a sick feeling that often comes before throwing up (vomiting). Vomiting is a reflex where stomach contents come out of your mouth. Vomiting can cause severe loss of body fluids (dehydration). Children and elderly adults can become dehydrated quickly, especially if they also have  diarrhea. Nausea and vomiting are symptoms of a condition or disease. It is important to find the cause of your symptoms. CAUSES   Direct irritation of the stomach lining. This irritation can result from increased acid production (gastroesophageal reflux disease), infection, food poisoning, taking certain medicines (such as nonsteroidal anti-inflammatory drugs), alcohol use, or tobacco use.  Signals from the brain.These signals could be caused by a headache, heat exposure, an inner ear disturbance, increased pressure in the brain from injury, infection, a tumor, or a concussion, pain, emotional stimulus, or metabolic problems.  An obstruction in the gastrointestinal tract (bowel obstruction).  Illnesses such as diabetes, hepatitis, gallbladder problems, appendicitis, kidney problems, cancer, sepsis, atypical symptoms of a heart attack, or eating disorders.  Medical treatments such as chemotherapy and radiation.  Receiving medicine that makes you sleep (general anesthetic) during surgery. DIAGNOSIS Your caregiver may ask for tests to be done if the problems do not improve after a few days. Tests may also be done if symptoms are severe or if the reason for the nausea and vomiting is not clear. Tests may include:  Urine tests.  Blood tests.  Stool tests.  Cultures (to look for evidence of infection).  X-rays or other imaging studies. Test results can help your caregiver make decisions about treatment or the need for additional tests. TREATMENT You need to stay well hydrated. Drink frequently but in small amounts.You may wish to drink water, sports drinks, clear broth, or eat frozen ice pops or gelatin dessert to help stay hydrated.When you eat, eating slowly may help prevent nausea.There are also some antinausea medicines that may help prevent nausea. HOME CARE INSTRUCTIONS   Take all medicine as directed by your caregiver.  If you do not have an appetite, do not force yourself to  eat. However, you must continue to drink fluids.  If you have an appetite, eat a normal diet unless your caregiver tells you differently.  Eat a variety of complex carbohydrates (rice, wheat, potatoes, bread), lean meats, yogurt, fruits, and vegetables.  Avoid high-fat foods because they are more difficult to digest.  Drink enough water and fluids to keep your urine clear or pale yellow.  If you are dehydrated, ask your caregiver for specific rehydration instructions. Signs of dehydration may include:  Severe thirst.  Dry lips and mouth.  Dizziness.  Dark urine.  Decreasing urine frequency and amount.  Confusion.  Rapid breathing or pulse. SEEK IMMEDIATE MEDICAL CARE IF:   You have blood or brown flecks (like coffee grounds) in your vomit.  You have black or bloody stools.  You have a severe headache or stiff neck.  You are confused.  You have severe abdominal pain.  You have chest pain or trouble breathing.  You do not urinate at least once every 8 hours.  You develop cold or clammy skin.  You continue to vomit for longer than 24 to 48 hours.  You have a fever. MAKE SURE YOU:   Understand these instructions.  Will watch your condition.  Will get help right away if you are not doing well or get worse.   This information is not intended to replace advice given to you by your health care

## 2016-01-25 ENCOUNTER — Ambulatory Visit (HOSPITAL_BASED_OUTPATIENT_CLINIC_OR_DEPARTMENT_OTHER): Payer: 59

## 2016-01-25 VITALS — BP 98/53 | HR 64 | Temp 98.9°F | Ht 71.5 in

## 2016-01-25 DIAGNOSIS — K1230 Oral mucositis (ulcerative), unspecified: Secondary | ICD-10-CM | POA: Diagnosis not present

## 2016-01-25 DIAGNOSIS — C099 Malignant neoplasm of tonsil, unspecified: Secondary | ICD-10-CM

## 2016-01-25 DIAGNOSIS — C090 Malignant neoplasm of tonsillar fossa: Secondary | ICD-10-CM

## 2016-01-25 MED ORDER — HYDROMORPHONE HCL 4 MG/ML IJ SOLN
INTRAMUSCULAR | Status: AC
  Filled 2016-01-25: qty 1

## 2016-01-25 MED ORDER — SODIUM CHLORIDE 0.9 % IV SOLN
Freq: Once | INTRAVENOUS | Status: AC
  Administered 2016-01-25: 09:00:00 via INTRAVENOUS

## 2016-01-25 MED ORDER — SODIUM CHLORIDE 0.9 % IJ SOLN
10.0000 mL | INTRAMUSCULAR | Status: DC | PRN
  Administered 2016-01-25: 10 mL
  Filled 2016-01-25: qty 10

## 2016-01-25 MED ORDER — HYDROMORPHONE HCL 4 MG/ML IJ SOLN
1.0000 mg | INTRAMUSCULAR | Status: DC | PRN
  Administered 2016-01-25: 1 mg via INTRAVENOUS

## 2016-01-25 MED ORDER — HEPARIN SOD (PORK) LOCK FLUSH 100 UNIT/ML IV SOLN
500.0000 [IU] | Freq: Once | INTRAVENOUS | Status: AC | PRN
  Administered 2016-01-25: 500 [IU]
  Filled 2016-01-25: qty 5

## 2016-01-25 NOTE — Patient Instructions (Signed)

## 2016-01-27 ENCOUNTER — Encounter: Payer: Self-pay | Admitting: Radiation Oncology

## 2016-01-27 ENCOUNTER — Ambulatory Visit
Admission: RE | Admit: 2016-01-27 | Discharge: 2016-01-27 | Disposition: A | Discharge status: Home / Self Care | Payer: 59 | Source: Ambulatory Visit | Attending: Radiation Oncology | Admitting: Radiation Oncology

## 2016-01-27 ENCOUNTER — Other Ambulatory Visit: Payer: Self-pay | Admitting: Hematology and Oncology

## 2016-01-27 ENCOUNTER — Ambulatory Visit (HOSPITAL_BASED_OUTPATIENT_CLINIC_OR_DEPARTMENT_OTHER): Payer: 59

## 2016-01-27 VITALS — BP 106/52 | HR 69 | Temp 99.2°F | Resp 18

## 2016-01-27 VITALS — BP 89/63 | HR 79 | Temp 98.8°F | Ht 71.5 in | Wt 185.8 lb

## 2016-01-27 DIAGNOSIS — C090 Malignant neoplasm of tonsillar fossa: Secondary | ICD-10-CM

## 2016-01-27 DIAGNOSIS — K1230 Oral mucositis (ulcerative), unspecified: Secondary | ICD-10-CM

## 2016-01-27 DIAGNOSIS — Z510 Encounter for antineoplastic radiation therapy: Secondary | ICD-10-CM | POA: Diagnosis not present

## 2016-01-27 MED ORDER — SODIUM CHLORIDE 0.9 % IJ SOLN
10.0000 mL | INTRAMUSCULAR | Status: DC | PRN
  Administered 2016-01-27: 10 mL
  Filled 2016-01-27: qty 10

## 2016-01-27 MED ORDER — HEPARIN SOD (PORK) LOCK FLUSH 100 UNIT/ML IV SOLN
500.0000 [IU] | Freq: Once | INTRAVENOUS | Status: AC | PRN
  Administered 2016-01-27: 500 [IU]
  Filled 2016-01-27: qty 5

## 2016-01-27 MED ORDER — SODIUM CHLORIDE 0.9 % IV SOLN
Freq: Once | INTRAVENOUS | Status: AC
Start: 2016-01-27 — End: 2016-01-27
  Administered 2016-01-27: 11:00:00 via INTRAVENOUS
  Filled 2016-01-27: qty 4

## 2016-01-27 MED ORDER — SODIUM CHLORIDE 0.9 % IV SOLN
Freq: Once | INTRAVENOUS | Status: AC
  Administered 2016-01-27: 11:00:00 via INTRAVENOUS

## 2016-01-27 MED ORDER — ALTEPLASE 2 MG IJ SOLR
2.0000 mg | Freq: Once | INTRAMUSCULAR | Status: DC | PRN
  Filled 2016-01-27: qty 2

## 2016-01-27 NOTE — Progress Notes (Signed)
   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  CBCT/MVCT images/Port film x-rays were reviewed.  The chart was checked.      Received Erbitux last week  6 times week getting IV fluids Pain controlled adequately. Wife feels he is managing relatively well. Using sonafine and neosporin on skin  Physical Findings:  Wt Readings from Last 3 Encounters:  01/27/16 185 lb 12.8 oz (84.278 kg)  01/21/16 189 lb 9.6 oz (86.002 kg)  01/20/16 191 lb 3.2 oz (86.728 kg)    height is 5' 11.5" (1.816 m) and weight is 185 lb 12.8 oz (84.278 kg). His oral temperature is 98.8 F (37.1 C). His blood pressure is 89/63 and his pulse is 79. His oxygen saturation is 99%.   Skin dark, erythematous and dry. large patches of superficial desquamation.  Erythema in oropharynx with thick saliva/patchy mucositis. No thrush.      CBC    Component Value Date/Time   WBC 5.6 01/21/2016 0934   WBC 6.8 12/12/2015 1317   RBC 4.39 01/21/2016 0934   RBC 4.63 12/12/2015 1317   HGB 14.0 01/21/2016 0934   HGB 14.4 12/12/2015 1317   HCT 41.2 01/21/2016 0934   HCT 43.5 12/12/2015 1317   PLT 186 01/21/2016 0934   PLT 213 12/12/2015 1317   MCV 93.9 01/21/2016 0934   MCV 94.0 12/12/2015 1317   MCH 31.8 01/21/2016 0934   MCH 31.1 12/12/2015 1317   MCHC 33.9 01/21/2016 0934   MCHC 33.1 12/12/2015 1317   RDW 13.4 01/21/2016 0934   RDW 12.9 12/12/2015 1317   LYMPHSABS 0.2* 01/21/2016 0934   LYMPHSABS 1.6 12/12/2015 1317   MONOABS 0.5 01/21/2016 0934   MONOABS 0.5 12/12/2015 1317   EOSABS 0.1 01/21/2016 0934   EOSABS 0.1 12/12/2015 1317   BASOSABS 0.0 01/21/2016 0934   BASOSABS 0.0 12/12/2015 1317     CMP     Component Value Date/Time   NA 138 01/21/2016 0933   NA 136 12/12/2015 1317   K 4.5 01/21/2016 0933   K 3.9  12/12/2015 1317   CL 103 12/12/2015 1317   CO2 28 01/21/2016 0933   CO2 24 12/12/2015 1317   GLUCOSE 98 01/21/2016 0933   GLUCOSE 91 12/12/2015 1317   BUN 10.6 01/21/2016 0933   BUN 17 12/12/2015 1317   CREATININE 0.9 01/21/2016 0933   CREATININE 1.11 12/12/2015 1317   CALCIUM 9.3 01/21/2016 0933   CALCIUM 9.0 12/12/2015 1317   PROT 7.0 01/21/2016 0933   PROT 7.0 09/24/2009 1331   ALBUMIN 3.0* 01/21/2016 0933   ALBUMIN 4.1 09/24/2009 1331   AST 15 01/21/2016 0933   AST 18 09/24/2009 1331   ALT 14 01/21/2016 0933   ALT 40 09/24/2009 1331   ALKPHOS 70 01/21/2016 0933   ALKPHOS 123* 09/24/2009 1331   BILITOT 0.46 01/21/2016 0933   BILITOT 0.3 09/24/2009 1331   GFRNONAA >60 12/12/2015 1317   GFRAA >60 12/12/2015 1317    Impression:  The patient is tolerating radiotherapy.    Plan:  Continue radiotherapy as planned.   Continue skin care w/ Neosporin over moist peeling area Try papaya juice for thick saliva. Mild orthostasis (See nursing note)-- IV fluids today -----------------------------------  Eppie [place], MD

## 2016-01-27 NOTE — Patient Instructions (Signed)

## 2016-01-27 NOTE — Progress Notes (Signed)
Gregory Jenkins is here for his 30th fraction of radiation to his Left Tonsil. He rates a pain a 3/10 in his throat. He is using the fentanyl patch, and baking soda rinses for pain. He has not been taking any breakthrough pain medicine at this time. He is instilling 4-5 cans of osmolite daily, he is unable to instill anymore as recommended because of nausea. He is not taking orally besides sips of water. He is taking 16 ounces of free water via his PEG tube daily. He is also receiving IV fluids 6 days a week in medical oncology. He relates the nausea to thick saliva and acid reflux. His neck is red, hyperpigmented, and peeling in certain areas. He is using sonafine and neosporin to his neck with some relief. His mouth is red with thick saliva present.   BP 89/63 mmHg  Pulse 79  Temp(Src) 98.8 F (37.1 C) (Oral)  Ht 5' 11.5" (1.816 m)  Wt 185 lb 12.8 oz (84.278 kg)  BMI 25.56 kg/m2  SpO2 99%   Orthostatics: BP sitting 109/69 pulse 66. BP standing 89/63 pulse 79  Wt Readings from Last 3 Encounters:  01/27/16 185 lb 12.8 oz (84.278 kg)  01/21/16 189 lb 9.6 oz (86.002 kg)  01/20/16 191 lb 3.2 oz (86.728 kg)

## 2016-01-28 ENCOUNTER — Encounter: Payer: Self-pay | Admitting: Hematology and Oncology

## 2016-01-28 ENCOUNTER — Other Ambulatory Visit: Payer: Self-pay | Admitting: Hematology and Oncology

## 2016-01-28 ENCOUNTER — Other Ambulatory Visit (HOSPITAL_BASED_OUTPATIENT_CLINIC_OR_DEPARTMENT_OTHER): Payer: 59

## 2016-01-28 ENCOUNTER — Ambulatory Visit (HOSPITAL_BASED_OUTPATIENT_CLINIC_OR_DEPARTMENT_OTHER): Payer: 59 | Admitting: Hematology and Oncology

## 2016-01-28 ENCOUNTER — Ambulatory Visit
Admission: RE | Admit: 2016-01-28 | Discharge: 2016-01-28 | Disposition: A | Discharge status: Home / Self Care | Payer: 59 | Source: Ambulatory Visit | Attending: Radiation Oncology | Admitting: Radiation Oncology

## 2016-01-28 ENCOUNTER — Ambulatory Visit (HOSPITAL_BASED_OUTPATIENT_CLINIC_OR_DEPARTMENT_OTHER): Payer: 59

## 2016-01-28 VITALS — BP 112/55 | HR 62 | Temp 98.2°F | Resp 18

## 2016-01-28 DIAGNOSIS — K1230 Oral mucositis (ulcerative), unspecified: Secondary | ICD-10-CM

## 2016-01-28 DIAGNOSIS — L270 Generalized skin eruption due to drugs and medicaments taken internally: Secondary | ICD-10-CM | POA: Diagnosis not present

## 2016-01-28 DIAGNOSIS — C099 Malignant neoplasm of tonsil, unspecified: Secondary | ICD-10-CM

## 2016-01-28 DIAGNOSIS — E441 Mild protein-calorie malnutrition: Secondary | ICD-10-CM

## 2016-01-28 DIAGNOSIS — Z510 Encounter for antineoplastic radiation therapy: Secondary | ICD-10-CM | POA: Diagnosis not present

## 2016-01-28 DIAGNOSIS — C090 Malignant neoplasm of tonsillar fossa: Secondary | ICD-10-CM

## 2016-01-28 LAB — CBC WITH DIFFERENTIAL/PLATELET
BASO%: 0.3 % (ref 0.0–2.0)
Basophils Absolute: 0 10*3/uL (ref 0.0–0.1)
EOS%: 2.3 % (ref 0.0–7.0)
Eosinophils Absolute: 0.1 10*3/uL (ref 0.0–0.5)
HEMATOCRIT: 41.6 % (ref 38.4–49.9)
HGB: 13.7 g/dL (ref 13.0–17.1)
LYMPH#: 0.1 10*3/uL — AB (ref 0.9–3.3)
LYMPH%: 2.6 % — ABNORMAL LOW (ref 14.0–49.0)
MCH: 30.8 pg (ref 27.2–33.4)
MCHC: 33 g/dL (ref 32.0–36.0)
MCV: 93.4 fL (ref 79.3–98.0)
MONO#: 0.5 10*3/uL (ref 0.1–0.9)
MONO%: 9.9 % (ref 0.0–14.0)
NEUT%: 84.9 % — ABNORMAL HIGH (ref 39.0–75.0)
NEUTROS ABS: 4.4 10*3/uL (ref 1.5–6.5)
PLATELETS: 191 10*3/uL (ref 140–400)
RBC: 4.46 10*6/uL (ref 4.20–5.82)
RDW: 13.8 % (ref 11.0–14.6)
WBC: 5.2 10*3/uL (ref 4.0–10.3)

## 2016-01-28 LAB — COMPREHENSIVE METABOLIC PANEL
ALT: 17 U/L (ref 0–55)
ANION GAP: 6 meq/L (ref 3–11)
AST: 16 U/L (ref 5–34)
Albumin: 3.1 g/dL — ABNORMAL LOW (ref 3.5–5.0)
Alkaline Phosphatase: 77 U/L (ref 40–150)
BILIRUBIN TOTAL: 0.53 mg/dL (ref 0.20–1.20)
BUN: 10.4 mg/dL (ref 7.0–26.0)
CALCIUM: 9.6 mg/dL (ref 8.4–10.4)
CHLORIDE: 103 meq/L (ref 98–109)
CO2: 30 meq/L — AB (ref 22–29)
CREATININE: 0.9 mg/dL (ref 0.7–1.3)
EGFR: >90 mL/min/{1.73_m2} (ref >90)
Glucose: 102 mg/dl (ref 70–140)
Potassium: 4.3 mEq/L (ref 3.5–5.1)
Sodium: 139 mEq/L (ref 136–145)
TOTAL PROTEIN: 7.1 g/dL (ref 6.4–8.3)

## 2016-01-28 LAB — MAGNESIUM: MAGNESIUM: 2 mg/dL (ref 1.5–2.5)

## 2016-01-28 MED ORDER — HYDROMORPHONE HCL 4 MG/ML IJ SOLN: 1.0000 mg | INTRAMUSCULAR | Status: DC | PRN

## 2016-01-28 MED ORDER — SODIUM CHLORIDE 0.9 % IJ SOLN
10.0000 mL | INTRAMUSCULAR | Status: DC | PRN
  Administered 2016-01-28: 10 mL
  Filled 2016-01-28: qty 10

## 2016-01-28 MED ORDER — HEPARIN SOD (PORK) LOCK FLUSH 100 UNIT/ML IV SOLN
500.0000 [IU] | Freq: Once | INTRAVENOUS | Status: AC | PRN
  Administered 2016-01-28: 500 [IU]
  Filled 2016-01-28: qty 5

## 2016-01-28 MED ORDER — SODIUM CHLORIDE 0.9 % IV SOLN
1000.0000 mL | Freq: Once | INTRAVENOUS | Status: AC
  Administered 2016-01-28: 1000 mL via INTRAVENOUS

## 2016-01-28 MED ORDER — SODIUM CHLORIDE 0.9 % IV SOLN
Freq: Once | INTRAVENOUS | Status: AC
  Administered 2016-01-28: 10:00:00 via INTRAVENOUS
  Filled 2016-01-28: qty 4

## 2016-01-28 NOTE — Assessment & Plan Note (Signed)
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 I will reassess next week

## 2016-01-28 NOTE — Assessment & Plan Note (Signed)
He has grade 2 mucositis  His pain is well-controlled with current dose of fentanyl  I will see him next week for further assessment

## 2016-01-28 NOTE — Patient Instructions (Signed)

## 2016-01-28 NOTE — Progress Notes (Signed)
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 Mild asymmetry of the left tonsil -- correlate with direct visualization to assess for primary neoplasm.   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   11/28/2015 PET scan 1. Left palatine tonsil primary with left worse than right cervical nodal metastasis. 2. No extracervical hypermetabolic metastasis.    12/12/2015 Procedure Peg and port-a-cath placement.   12/18/2015 -  Chemotherapy He received weekly cetuximab   12/18/2015 -  Radiation Therapy He received concurrent radiation   12/31/2015 Adverse Reaction Treatment #3 is placed on hold due to worsening mucositis   01/08/2016 Miscellaneous Cetuximab #3 resumed with 50% dose adjustment   01/14/2016 Adverse Reaction Treatment #4 is placed on hold due to worsening mucositis and failure to thrive    INTERVAL HISTORY: Please see below for problem oriented charting. He is seen at the treatment area. He is doing well with IV fluid hydration and is able to tolerate at least 5 cans of nutritional supplement on a regular basis. He continues to have excessive mucus production with occasional mucus gagging. His throat pain appears to be well-controlled with current prescription dose of  fentanyl patch. Denies constipation. No recent fever, chills or coughing.  REVIEW OF SYSTEMS:   Constitutional: Denies fevers, chills or abnormal weight loss Eyes: Denies blurriness of vision Respiratory: Denies cough, dyspnea or wheezes Cardiovascular: Denies palpitation, chest discomfort or lower extremity swelling Gastrointestinal:  Denies nausea, heartburn or change in bowel habits Lymphatics: Denies new lymphadenopathy or easy bruising Neurological:Denies numbness, tingling or new weaknesses Behavioral/Psych: Mood is stable, no new changes  All other systems were reviewed with the patient and are negative.  I have reviewed the past medical history, past surgical history, social history and family history with the patient and they are unchanged from previous note.  ALLERGIES:  has No Known Allergies.  MEDICATIONS:  Current Outpatient Prescriptions  Medication Sig Dispense Refill  . aspirin EC 81 MG tablet Take 81 mg by mouth daily.    . clindamycin (CLINDAGEL) 1 % gel Apply topically 2 (two) times daily. 30 g 0  . fentaNYL (DURAGESIC - DOSED MCG/HR) 25 MCG/HR patch Place 1 patch (25 mcg total) onto the skin every 3 (three) days. 5 patch 0  . hydrocortisone 1 % ointment Apply 1 application topically 2 (two) times daily. 30 g 0  . ibuprofen (ADVIL,MOTRIN) 200 MG tablet Take 200 mg by mouth as needed. Reported on 01/27/2016    . levothyroxine (SYNTHROID, LEVOTHROID) 75 MCG tablet Take 75 mcg by mouth daily before breakfast.    . lidocaine (XYLOCAINE) 2 % solution Mix 1 part 2%viscous lidocaine,1part H2O.Swish and/or swallow 22mL of this mixture,59min before meals and at bedtime, up to QID (Patient not taking: Reported on 01/20/2016) 100 mL 5  . lidocaine-prilocaine (EMLA) cream Apply to affected area once 30 g 3  . LORazepam (ATIVAN)  0.5 MG tablet Take 1 tablet 20-30 minutes prior to treatment. (Patient not taking: Reported on 01/27/2016) 20 tablet 0  . losartan (COZAAR) 25 MG tablet Take  25 mg by mouth every morning. Reported on 01/27/2016  2  . morphine (ROXANOL) 20 MG/ML concentrated solution Place 0.5 mLs (10 mg total) into feeding tube every 2 (two) hours as needed for severe pain. (Patient not taking: Reported on 01/13/2016) 240 mL 0  . Nutritional Supplements (FEEDING SUPPLEMENT, OSMOLITE 1.5 CAL,) LIQD Begin one can Osmolite 1.5, or equivalent, QID with 30 cc free water before and 60 cc free water after. On day 3, increase to 1 1/2 cans BID and continue 2 cans BID as tolerated.On day 5, increase to 1 1/2 cans QID as tolerated.On day 7, increase to goal rate of 1 1/2 cans TID and two cans once for total of 6.5 cans daily.Flush tube or drink addition 32 oz water daily.Send TF and supplies. 1541 mL 4  . ondansetron (ZOFRAN) 8 MG tablet Take 1 tablet (8 mg total) by mouth every 8 (eight) hours as needed for nausea. 30 tablet 3  . oxyCODONE-acetaminophen (PERCOCET/ROXICET) 5-325 MG tablet Take 1 tablet by mouth every 4 (four) hours as needed for moderate pain. Reported on 12/25/2015 (Patient not taking: Reported on 01/13/2016) 60 tablet 0  . promethazine (PHENERGAN) 25 MG tablet Take 1 tablet (25 mg total) by mouth every 6 (six) hours as needed for nausea. 30 tablet 3  . ranitidine (ZANTAC) 150 MG capsule Take 150 mg by mouth 2 (two) times daily. Reported on 01/27/2016    . scopolamine (TRANSDERM-SCOP) 1 MG/3DAYS Place 1 patch (1.5 mg total) onto the skin every 3 (three) days. (Patient not taking: Reported on 01/20/2016) 10 patch 1  . sodium fluoride (FLUORISHIELD) 1.1 % GEL dental gel Instill one drop of gel per tooth space of fluoride tray. Place over teeth for 5 minutes. Remove. Spit out excess. Repeat nightly. 120 mL PRN  . sucralfate (CARAFATE) 1 g tablet Dissolve 1 tablet in 3mL H2O and swallow up to QID,PRN sore throat. (Patient not taking: Reported on 01/13/2016) 60 tablet 5   No current facility-administered medications for this visit.    PHYSICAL EXAMINATION: ECOG PERFORMANCE  STATUS: 1 - Symptomatic but completely ambulatory  Filed Vitals:   01/28/16 1423  BP: 112/55  Pulse: 62  Temp: 98.2 F (36.8 C)  Resp: 18   There were no vitals filed for this visit.  GENERAL:alert, no distress and comfortable SKIN: Noted significant skin rash around his neck, worse compared to last week but no open sore or ulceration  EYES: normal, Conjunctiva are pink and non-injected, sclera clear OROPHARYNX: Noted grade 1 mucositis. No thrush.  NECK: supple, thyroid normal size, non-tender, without nodularity LYMPH:  no palpable lymphadenopathy in the cervical, axillary or inguinal LUNGS: clear to auscultation and percussion with normal breathing effort HEART: regular rate & rhythm and no murmurs and no lower extremity edema ABDOMEN:abdomen soft, non-tender and normal bowel sounds. Feeding tube site looks okay Musculoskeletal:no cyanosis of digits and no clubbing  NEURO: alert & oriented x 3 with fluent speech, no focal motor/sensory deficits  LABORATORY DATA:  I have reviewed the data as listed    Component Value Date/Time   NA 139 01/28/2016 0929   NA 136 12/12/2015 1317   K 4.3 01/28/2016 0929   K 3.9 12/12/2015 1317   CL 103 12/12/2015 1317   CO2 30* 01/28/2016 0929   CO2 24 12/12/2015 1317  GLUCOSE 102 01/28/2016 0929   GLUCOSE 91 12/12/2015 1317   BUN 10.4 01/28/2016 0929   BUN 17 12/12/2015 1317   CREATININE 0.9 01/28/2016 0929   CREATININE 1.11 12/12/2015 1317   CALCIUM 9.6 01/28/2016 0929   CALCIUM 9.0 12/12/2015 1317   PROT 7.1 01/28/2016 0929   PROT 7.0 09/24/2009 1331   ALBUMIN 3.1* 01/28/2016 0929   ALBUMIN 4.1 09/24/2009 1331   AST 16 01/28/2016 0929   AST 18 09/24/2009 1331   ALT 17 01/28/2016 0929   ALT 40 09/24/2009 1331   ALKPHOS 77 01/28/2016 0929   ALKPHOS 123* 09/24/2009 1331   BILITOT 0.53 01/28/2016 0929   BILITOT 0.3 09/24/2009 1331   GFRNONAA >60 12/12/2015 1317   GFRAA >60 12/12/2015 1317    No results found for: SPEP,  UPEP  Lab Results  Component Value Date   WBC 5.2 01/28/2016   NEUTROABS 4.4 01/28/2016   HGB 13.7 01/28/2016   HCT 41.6 01/28/2016   MCV 93.4 01/28/2016   PLT 191 01/28/2016      Chemistry      Component Value Date/Time   NA 139 01/28/2016 0929   NA 136 12/12/2015 1317   K 4.3 01/28/2016 0929   K 3.9 12/12/2015 1317   CL 103 12/12/2015 1317   CO2 30* 01/28/2016 0929   CO2 24 12/12/2015 1317   BUN 10.4 01/28/2016 0929   BUN 17 12/12/2015 1317   CREATININE 0.9 01/28/2016 0929   CREATININE 1.11 12/12/2015 1317      Component Value Date/Time   CALCIUM 9.6 01/28/2016 0929   CALCIUM 9.0 12/12/2015 1317   ALKPHOS 77 01/28/2016 0929   ALKPHOS 123* 09/24/2009 1331   AST 16 01/28/2016 0929   AST 18 09/24/2009 1331   ALT 17 01/28/2016 0929   ALT 40 09/24/2009 1331   BILITOT 0.53 01/28/2016 0929   BILITOT 0.3 09/24/2009 1331      ASSESSMENT & PLAN:  Tonsil cancer ([place]) He has mild worsening skin toxicity compared to a week ago but overall tolerated treatment fairly well. I will continue reduced dose treatment as before tomorrow. The patient will complete his treatment soon.  Drug-induced skin rash He has grade 1-2 skin toxicity from cetuximab and radiation I recommend he continues prescription hydrocortisone and topical antibiotic cream. I will reassess next week  Protein-calorie malnutrition, mild ([place]) He is struggling with oral intake and has lost weight but overall feeling better since we started him on IV fluid hydration therapy He is 100% dependent on feeding tube now I recommend IV fluid support daily and to focus on nutritional supplement through the feeding tube   Mucositis oral He has grade 2 mucositis  His pain is well-controlled with current dose of fentanyl  I will see him next week for further assessment       No orders of the defined types were placed in this encounter.   All questions were answered. The patient knows to call the clinic with  any problems, questions or concerns. No barriers to learning was detected. I spent 20 minutes counseling the patient face to face. The total time spent in the appointment was 25 minutes and more than 50% was on counseling and review of test results     [facility], [place], MD 01/28/2016 2:59 PM

## 2016-01-28 NOTE — Assessment & Plan Note (Signed)
He is struggling with oral intake and has lost weight but overall feeling better since we started him on IV fluid hydration therapy He is 100% dependent on feeding tube now I recommend IV fluid support daily and to focus on nutritional supplement through the feeding tube

## 2016-01-28 NOTE — Assessment & Plan Note (Signed)
He has mild worsening skin toxicity compared to a week ago but overall tolerated treatment fairly well. I will continue reduced dose treatment as before tomorrow. The patient will complete his treatment soon.

## 2016-01-28 NOTE — Progress Notes (Signed)
Patient ambulates well with no dizziness noted post IVFs. Post vital signs stable.  No signs of distress.  Patient discharged home with wife with no complaints.

## 2016-01-29 ENCOUNTER — Other Ambulatory Visit: Payer: Self-pay

## 2016-01-29 ENCOUNTER — Ambulatory Visit: Payer: 59 | Admitting: Nutrition

## 2016-01-29 ENCOUNTER — Ambulatory Visit
Admission: RE | Admit: 2016-01-29 | Discharge: 2016-01-29 | Disposition: A | Discharge status: Home / Self Care | Payer: 59 | Source: Ambulatory Visit | Attending: Radiation Oncology | Admitting: Radiation Oncology

## 2016-01-29 ENCOUNTER — Encounter: Payer: Self-pay | Admitting: Nutrition

## 2016-01-29 ENCOUNTER — Ambulatory Visit (HOSPITAL_BASED_OUTPATIENT_CLINIC_OR_DEPARTMENT_OTHER): Payer: 59

## 2016-01-29 VITALS — BP 108/64 | HR 61 | Temp 98.4°F | Resp 18

## 2016-01-29 DIAGNOSIS — C090 Malignant neoplasm of tonsillar fossa: Secondary | ICD-10-CM

## 2016-01-29 DIAGNOSIS — Z5112 Encounter for antineoplastic immunotherapy: Secondary | ICD-10-CM

## 2016-01-29 DIAGNOSIS — K1230 Oral mucositis (ulcerative), unspecified: Secondary | ICD-10-CM

## 2016-01-29 DIAGNOSIS — C099 Malignant neoplasm of tonsil, unspecified: Secondary | ICD-10-CM

## 2016-01-29 DIAGNOSIS — Z510 Encounter for antineoplastic radiation therapy: Secondary | ICD-10-CM | POA: Diagnosis not present

## 2016-01-29 MED ORDER — SODIUM CHLORIDE 0.9 % IJ SOLN
10.0000 mL | INTRAMUSCULAR | Status: DC | PRN
  Filled 2016-01-29: qty 10

## 2016-01-29 MED ORDER — HEPARIN SOD (PORK) LOCK FLUSH 100 UNIT/ML IV SOLN
500.0000 [IU] | Freq: Once | INTRAVENOUS | Status: AC | PRN
  Administered 2016-01-29: 500 [IU]
  Filled 2016-01-29: qty 5

## 2016-01-29 MED ORDER — CETUXIMAB CHEMO IV INJECTION 200 MG/100ML
125.0000 mg/m2 | Freq: Once | INTRAVENOUS | Status: AC
  Administered 2016-01-29: 300 mg via INTRAVENOUS
  Filled 2016-01-29: qty 100

## 2016-01-29 MED ORDER — SODIUM CHLORIDE 0.9 % IV SOLN
Freq: Once | INTRAVENOUS | Status: AC
  Administered 2016-01-29: 09:00:00 via INTRAVENOUS
  Filled 2016-01-29: qty 4

## 2016-01-29 MED ORDER — SODIUM CHLORIDE 0.9 % IV SOLN
Freq: Once | INTRAVENOUS | Status: AC
  Administered 2016-01-29: 09:00:00 via INTRAVENOUS

## 2016-01-29 MED ORDER — ALTEPLASE 2 MG IJ SOLR
2.0000 mg | Freq: Once | INTRAMUSCULAR | Status: DC | PRN
  Filled 2016-01-29: qty 2

## 2016-01-29 MED ORDER — SODIUM CHLORIDE 0.9% FLUSH
10.0000 mL | INTRAVENOUS | Status: DC | PRN
  Administered 2016-01-29: 10 mL
  Filled 2016-01-29: qty 10

## 2016-01-29 MED ORDER — HEPARIN SOD (PORK) LOCK FLUSH 100 UNIT/ML IV SOLN
500.0000 [IU] | Freq: Once | INTRAVENOUS | Status: DC | PRN
  Filled 2016-01-29: qty 5

## 2016-01-29 MED ORDER — DIPHENHYDRAMINE HCL 50 MG/ML IJ SOLN
50.0000 mg | Freq: Once | INTRAMUSCULAR | Status: AC
  Administered 2016-01-29: 50 mg via INTRAVENOUS

## 2016-01-29 MED ORDER — DIPHENHYDRAMINE HCL 50 MG/ML IJ SOLN
INTRAMUSCULAR | Status: AC
  Filled 2016-01-29: qty 1

## 2016-01-29 MED ORDER — HEPARIN SOD (PORK) LOCK FLUSH 100 UNIT/ML IV SOLN
250.0000 [IU] | Freq: Once | INTRAVENOUS | Status: DC | PRN
  Filled 2016-01-29: qty 5

## 2016-01-29 MED ORDER — SODIUM CHLORIDE 0.9 % IV SOLN: Freq: Once | INTRAVENOUS | Status: DC

## 2016-01-29 NOTE — Progress Notes (Signed)
Nutrition follow-up completed with patient's wife during IV fluids and chemotherapy for tonsil cancer. Reports patient is tolerating 5-6 cans Osmolite 1.5 daily. Patient is not eating or drinking by mouth. Current weight decreased and documented as 185.8 pounds April 3 decreased from 189.6 pounds.  Nutrition diagnosis: Inadequate oral intake continues.  Intervention:  Educated patient's wife to attempt increasing bolus feeding to 1.5 cans four times daily four hours apart after radiation treatment completed. Educated patient's wife on option of using pump for continuous feedings nocturnally however, wife declines feeding pump at this time. Questions answered.  Teach back method used.  Provided active and supportive listening.  Monitoring, evaluation, goals: Patient will tolerate bolus feedings to meet greater than 90% of estimated nutrition needs to minimize further weight loss.  Next visit: Wednesday, April 12, during infusion.  **Disclaimer: This note was dictated with voice recognition software. Similar sounding words can inadvertently be transcribed and this note may contain transcription errors which may not have been corrected upon publication of note.**

## 2016-01-29 NOTE — Patient Instructions (Signed)
[facility] Discharge Instructions for Patients Receiving Chemotherapy  Today you received the following chemotherapy agent, Erbitux.  To help prevent nausea and vomiting after your treatment, we encourage you to take your nausea medication as prescribed.  If you develop nausea and vomiting that is not controlled by your nausea medication, call the clinic.   BELOW ARE SYMPTOMS THAT SHOULD BE REPORTED IMMEDIATELY:  *FEVER GREATER THAN 100.5 F  *CHILLS WITH OR WITHOUT FEVER  NAUSEA AND VOMITING THAT IS NOT CONTROLLED WITH YOUR NAUSEA MEDICATION  *UNUSUAL SHORTNESS OF BREATH  *UNUSUAL BRUISING OR BLEEDING  TENDERNESS IN MOUTH AND THROAT WITH OR WITHOUT PRESENCE OF ULCERS  *URINARY PROBLEMS  *BOWEL PROBLEMS  UNUSUAL RASH Items with * indicate a potential emergency and should be followed up as soon as possible.  Feel free to call the clinic you have any questions or concerns. The clinic phone number is [phone].  Please show the [facility] at check-in to the Emergency Department and triage nurse.  Dehydration, Adult Dehydration is a condition in which you do not have enough fluid or water in your body. It happens when you take in less fluid than you lose. Vital organs such as the kidneys, brain, and heart cannot function without a proper amount of fluids. Any loss of fluids from the body can cause dehydration.  Dehydration can range from mild to severe. This condition should be treated right away to help prevent it from becoming severe. CAUSES  This condition may be caused by:  Vomiting.  Diarrhea.  Excessive sweating, such as when exercising in hot or humid weather.  Not drinking enough fluid during strenuous exercise or during an illness.  Excessive urine output.  Fever.  Certain medicines. RISK FACTORS This condition is more likely to develop in:  People who are taking certain medicines that cause the body to lose excess fluid  (diuretics).   People who have a chronic illness, such as diabetes, that may increase urination.  Older adults.   People who live at high altitudes.   People who participate in endurance sports.  SYMPTOMS  Mild Dehydration  Thirst.  Dry lips.  Slightly dry mouth.  Dry, warm skin. Moderate Dehydration  Very dry mouth.   Muscle cramps.   Dark urine and decreased urine production.   Decreased tear production.   Headache.   Light-headedness, especially when you stand up from a sitting position.  Severe Dehydration  Changes in skin.   Cold and clammy skin.   Skin does not spring back quickly when lightly pinched and released.   Changes in body fluids.   Extreme thirst.   No tears.   Not able to sweat when body temperature is high, such as in hot weather.   Minimal urine production.   Changes in vital signs.   Rapid, weak pulse (more than 100 beats per minute when you are sitting still).   Rapid breathing.   Low blood pressure.   Other changes.   Sunken eyes.   Cold hands and feet.   Confusion.  Lethargy and difficulty being awakened.  Fainting (syncope).   Short-term weight loss.   Unconsciousness. DIAGNOSIS  This condition may be diagnosed based on your symptoms. You may also have tests to determine how severe your dehydration is. These tests may include:   Urine tests.   Blood tests.  TREATMENT  Treatment for this condition depends on the severity. Mild or moderate dehydration can often be treated at home. Treatment should  be started right away. Do not wait until dehydration becomes severe. Severe dehydration needs to be treated at the hospital. Treatment for Mild Dehydration  Drinking plenty of water to replace the fluid you have lost.   Replacing minerals in your blood (electrolytes) that you may have lost.  Treatment for Moderate Dehydration  Consuming oral rehydration solution (ORS). Treatment  for Severe Dehydration  Receiving fluid through an IV tube.   Receiving electrolyte solution through a feeding tube that is passed through your nose and into your stomach (nasogastric tube or NG tube).  Correcting any abnormalities in electrolytes. HOME CARE INSTRUCTIONS   Drink enough fluid to keep your urine clear or pale yellow.   Drink water or fluid slowly by taking small sips. You can also try sucking on ice cubes.  Have food or beverages that contain electrolytes. Examples include bananas and sports drinks.  Take over-the-counter and prescription medicines only as told by your health care provider.   Prepare ORS according to the manufacturer's instructions. Take sips of ORS every 5 minutes until your urine returns to normal.  If you have vomiting or diarrhea, continue to try to drink water, ORS, or both.   If you have diarrhea, avoid:   Beverages that contain caffeine.   Fruit juice.   Milk.   Carbonated soft drinks.  Do not take salt tablets. This can lead to the condition of having too much sodium in your body (hypernatremia).  SEEK MEDICAL CARE IF:  You cannot eat or drink without vomiting.  You have had moderate diarrhea during a period of more than 24 hours.  You have a fever. SEEK IMMEDIATE MEDICAL CARE IF:   You have extreme thirst.  You have severe diarrhea.  You have not urinated in 6-8 hours, or you have urinated only a small amount of very dark urine.  You have shriveled skin.  You are dizzy, confused, or both.   This information is not intended to replace advice given to you by your health care provider. Make sure you discuss any questions you have with your health care provider.   Document Released: 10/12/2005 Document Revised: 07/03/2015 Document Reviewed: 02/27/2015 Elsevier Interactive Patient Education [employer].

## 2016-01-29 NOTE — Progress Notes (Signed)
Patient declined AVS 

## 2016-01-30 ENCOUNTER — Ambulatory Visit
Admission: RE | Admit: 2016-01-30 | Discharge: 2016-01-30 | Disposition: A | Discharge status: Home / Self Care | Payer: 59 | Source: Ambulatory Visit | Attending: Radiation Oncology | Admitting: Radiation Oncology

## 2016-01-30 ENCOUNTER — Ambulatory Visit (HOSPITAL_BASED_OUTPATIENT_CLINIC_OR_DEPARTMENT_OTHER): Payer: 59

## 2016-01-30 VITALS — BP 107/57 | HR 69 | Temp 98.9°F | Resp 16

## 2016-01-30 DIAGNOSIS — C090 Malignant neoplasm of tonsillar fossa: Secondary | ICD-10-CM

## 2016-01-30 DIAGNOSIS — K1230 Oral mucositis (ulcerative), unspecified: Secondary | ICD-10-CM

## 2016-01-30 DIAGNOSIS — Z510 Encounter for antineoplastic radiation therapy: Secondary | ICD-10-CM | POA: Diagnosis not present

## 2016-01-30 MED ORDER — HEPARIN SOD (PORK) LOCK FLUSH 100 UNIT/ML IV SOLN
500.0000 [IU] | Freq: Once | INTRAVENOUS | Status: AC | PRN
  Administered 2016-01-30: 500 [IU]
  Filled 2016-01-30: qty 5

## 2016-01-30 MED ORDER — SODIUM CHLORIDE 0.9 % IJ SOLN
10.0000 mL | INTRAMUSCULAR | Status: DC | PRN
  Administered 2016-01-30: 10 mL
  Filled 2016-01-30: qty 10

## 2016-01-30 MED ORDER — SODIUM CHLORIDE 0.9 % IV SOLN
1000.0000 mL | Freq: Once | INTRAVENOUS | Status: AC
  Administered 2016-01-30: 1000 mL via INTRAVENOUS

## 2016-01-30 MED ORDER — SODIUM CHLORIDE 0.9 % IV SOLN
Freq: Once | INTRAVENOUS | Status: AC
  Administered 2016-01-30: 09:00:00 via INTRAVENOUS
  Filled 2016-01-30: qty 4

## 2016-01-30 NOTE — Patient Instructions (Signed)

## 2016-01-31 ENCOUNTER — Other Ambulatory Visit: Payer: Self-pay | Admitting: Hematology and Oncology

## 2016-01-31 ENCOUNTER — Ambulatory Visit
Admission: RE | Admit: 2016-01-31 | Discharge: 2016-01-31 | Disposition: A | Discharge status: Home / Self Care | Payer: 59 | Source: Ambulatory Visit | Attending: Radiation Oncology | Admitting: Radiation Oncology

## 2016-01-31 ENCOUNTER — Ambulatory Visit (HOSPITAL_BASED_OUTPATIENT_CLINIC_OR_DEPARTMENT_OTHER): Payer: 59

## 2016-01-31 VITALS — BP 101/53 | HR 70 | Temp 98.9°F | Resp 18

## 2016-01-31 DIAGNOSIS — Z510 Encounter for antineoplastic radiation therapy: Secondary | ICD-10-CM | POA: Diagnosis not present

## 2016-01-31 DIAGNOSIS — K1230 Oral mucositis (ulcerative), unspecified: Secondary | ICD-10-CM | POA: Diagnosis not present

## 2016-01-31 DIAGNOSIS — C090 Malignant neoplasm of tonsillar fossa: Secondary | ICD-10-CM | POA: Diagnosis not present

## 2016-01-31 MED ORDER — SODIUM CHLORIDE 0.9 % IV SOLN
Freq: Once | INTRAVENOUS | Status: AC
  Administered 2016-01-31: 09:00:00 via INTRAVENOUS
  Filled 2016-01-31: qty 4

## 2016-01-31 MED ORDER — SODIUM CHLORIDE 0.9 % IJ SOLN
10.0000 mL | INTRAMUSCULAR | Status: DC | PRN
  Administered 2016-01-31: 10 mL
  Filled 2016-01-31: qty 10

## 2016-01-31 MED ORDER — HEPARIN SOD (PORK) LOCK FLUSH 100 UNIT/ML IV SOLN
500.0000 [IU] | Freq: Once | INTRAVENOUS | Status: AC | PRN
  Administered 2016-01-31: 500 [IU]
  Filled 2016-01-31: qty 5

## 2016-01-31 MED ORDER — SODIUM CHLORIDE 0.9 % IV SOLN
Freq: Once | INTRAVENOUS | Status: AC
  Administered 2016-01-31: 09:00:00 via INTRAVENOUS

## 2016-01-31 NOTE — Patient Instructions (Signed)

## 2016-02-01 ENCOUNTER — Ambulatory Visit (HOSPITAL_BASED_OUTPATIENT_CLINIC_OR_DEPARTMENT_OTHER): Payer: 59

## 2016-02-01 VITALS — BP 106/67 | HR 72 | Temp 98.4°F | Resp 18

## 2016-02-01 DIAGNOSIS — C090 Malignant neoplasm of tonsillar fossa: Secondary | ICD-10-CM

## 2016-02-01 DIAGNOSIS — K1230 Oral mucositis (ulcerative), unspecified: Secondary | ICD-10-CM

## 2016-02-01 MED ORDER — HEPARIN SOD (PORK) LOCK FLUSH 100 UNIT/ML IV SOLN
500.0000 [IU] | Freq: Once | INTRAVENOUS | Status: AC | PRN
  Administered 2016-02-01: 500 [IU]
  Filled 2016-02-01: qty 5

## 2016-02-01 MED ORDER — SODIUM CHLORIDE 0.9 % IJ SOLN
10.0000 mL | INTRAMUSCULAR | Status: DC | PRN
  Administered 2016-02-01: 10 mL
  Filled 2016-02-01: qty 10

## 2016-02-01 MED ORDER — HYDROMORPHONE HCL 4 MG/ML IJ SOLN
INTRAMUSCULAR | Status: AC
  Filled 2016-02-01: qty 1

## 2016-02-01 MED ORDER — SODIUM CHLORIDE 0.9 % IV SOLN
Freq: Once | INTRAVENOUS | Status: AC
  Administered 2016-02-01: 09:00:00 via INTRAVENOUS

## 2016-02-01 MED ORDER — HYDROMORPHONE HCL 4 MG/ML IJ SOLN
1.0000 mg | INTRAMUSCULAR | Status: DC | PRN
  Administered 2016-02-01: 1 mg via INTRAVENOUS

## 2016-02-03 ENCOUNTER — Encounter: Payer: Self-pay | Admitting: *Deleted

## 2016-02-03 ENCOUNTER — Ambulatory Visit
Admission: RE | Admit: 2016-02-03 | Discharge: 2016-02-03 | Disposition: A | Discharge status: Home / Self Care | Payer: 59 | Source: Ambulatory Visit | Attending: Radiation Oncology | Admitting: Radiation Oncology

## 2016-02-03 ENCOUNTER — Ambulatory Visit (HOSPITAL_BASED_OUTPATIENT_CLINIC_OR_DEPARTMENT_OTHER): Payer: 59

## 2016-02-03 ENCOUNTER — Encounter: Payer: Self-pay | Admitting: Radiation Oncology

## 2016-02-03 VITALS — BP 106/53 | HR 72 | Temp 97.6°F | Resp 16

## 2016-02-03 VITALS — BP 109/64 | HR 83 | Temp 98.0°F | Ht 71.5 in | Wt 183.8 lb

## 2016-02-03 DIAGNOSIS — C090 Malignant neoplasm of tonsillar fossa: Secondary | ICD-10-CM

## 2016-02-03 DIAGNOSIS — K1230 Oral mucositis (ulcerative), unspecified: Secondary | ICD-10-CM

## 2016-02-03 DIAGNOSIS — Z510 Encounter for antineoplastic radiation therapy: Secondary | ICD-10-CM | POA: Diagnosis not present

## 2016-02-03 MED ORDER — HYDROMORPHONE HCL 4 MG/ML IJ SOLN
1.0000 mg | INTRAMUSCULAR | Status: DC | PRN
Start: 2016-02-03 — End: 2016-02-03
  Administered 2016-02-03: 0.5 mg via INTRAVENOUS

## 2016-02-03 MED ORDER — HYDROMORPHONE HCL 4 MG/ML IJ SOLN
INTRAMUSCULAR | Status: AC
Start: 2016-02-03 — End: 2016-02-03
  Filled 2016-02-03: qty 1

## 2016-02-03 MED ORDER — HEPARIN SOD (PORK) LOCK FLUSH 100 UNIT/ML IV SOLN
500.0000 [IU] | Freq: Once | INTRAVENOUS | Status: AC | PRN
  Administered 2016-02-03: 500 [IU]
  Filled 2016-02-03: qty 5

## 2016-02-03 MED ORDER — SODIUM CHLORIDE 0.9 % IJ SOLN
10.0000 mL | INTRAMUSCULAR | Status: DC | PRN
  Administered 2016-02-03: 10 mL
  Filled 2016-02-03: qty 10

## 2016-02-03 MED ORDER — SODIUM CHLORIDE 0.9 % IV SOLN
Freq: Once | INTRAVENOUS | Status: AC
  Administered 2016-02-03: 10:00:00 via INTRAVENOUS

## 2016-02-03 MED ORDER — LORAZEPAM 0.5 MG PO TABS: ORAL_TABLET | ORAL | Status: DC

## 2016-02-03 NOTE — Progress Notes (Signed)
   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  CBCT/MVCT images/Port film x-rays were reviewed.  The chart was checked.  Thick saliva causes nausea, helped a bit by phenergan.  Gag reflex makes it impossible to swallow.  Weight down 2 lb, not orthostatic   Physical Findings:  Wt Readings from Last 3 Encounters:  02/03/16 183 lb 12.8 oz (83.371 kg)  01/27/16 185 lb 12.8 oz (84.278 kg)  01/21/16 189 lb 9.6 oz (86.002 kg)    height is 5' 11.5" (1.816 m) and weight is 183 lb 12.8 oz (83.371 kg). His temperature is 98 F (36.7 C). His blood pressure is 109/64 and his pulse is 83.   Skin dark, erythematous and dry. Large patches of superficial desquamation. Wet due to application of sonafine. Confluent mucositis and Erythema in oropharynx with thick saliva/patchy mucositis. No thrush.      CBC    Component Value Date/Time   WBC 5.2 01/28/2016 0930   WBC 6.8 12/12/2015 1317   RBC 4.46 01/28/2016 0930   RBC 4.63 12/12/2015 1317   HGB 13.7 01/28/2016 0930   HGB 14.4 12/12/2015 1317   HCT 41.6 01/28/2016 0930   HCT 43.5 12/12/2015 1317   PLT 191 01/28/2016 0930   PLT 213 12/12/2015 1317   MCV 93.4 01/28/2016 0930   MCV 94.0 12/12/2015 1317   MCH 30.8 01/28/2016 0930   MCH 31.1 12/12/2015 1317   MCHC 33.0 01/28/2016 0930   MCHC 33.1 12/12/2015 1317   RDW 13.8 01/28/2016 0930   RDW 12.9 12/12/2015 1317   LYMPHSABS 0.1* 01/28/2016 0930   LYMPHSABS 1.6 12/12/2015 1317   MONOABS 0.5 01/28/2016 0930   MONOABS 0.5 12/12/2015 1317   EOSABS 0.1 01/28/2016 0930   EOSABS 0.1 12/12/2015 1317   BASOSABS 0.0 01/28/2016 0930   BASOSABS 0.0 12/12/2015 1317     CMP     Component Value Date/Time   NA 139 01/28/2016 0929   NA 136 12/12/2015 1317   K 4.3 01/28/2016  0929   K 3.9 12/12/2015 1317   CL 103 12/12/2015 1317   CO2 30* 01/28/2016 0929   CO2 24 12/12/2015 1317   GLUCOSE 102 01/28/2016 0929   GLUCOSE 91 12/12/2015 1317   BUN 10.4 01/28/2016 0929   BUN 17 12/12/2015 1317   CREATININE 0.9 01/28/2016 0929   CREATININE 1.11 12/12/2015 1317   CALCIUM 9.6 01/28/2016 0929   CALCIUM 9.0 12/12/2015 1317   PROT 7.1 01/28/2016 0929   PROT 7.0 09/24/2009 1331   ALBUMIN 3.1* 01/28/2016 0929   ALBUMIN 4.1 09/24/2009 1331   AST 16 01/28/2016 0929   AST 18 09/24/2009 1331   ALT 17 01/28/2016 0929   ALT 40 09/24/2009 1331   ALKPHOS 77 01/28/2016 0929   ALKPHOS 123* 09/24/2009 1331   BILITOT 0.53 01/28/2016 0929   BILITOT 0.3 09/24/2009 1331   GFRNONAA >60 12/12/2015 1317   GFRAA >60 12/12/2015 1317    Impression:  The patient has tolerated RT  Plan:  F/u in 2wks - sooner if needed. Try Ativan for nausea PRN  -----------------------------------  Eppie Gibson, MD

## 2016-02-03 NOTE — Progress Notes (Addendum)
Gregory Jenkins is here for his last fraction of radiation to his Left Tonsil and Bilateral neck. He rates his pain a 7/10 inside and outside his throat. He is only using the fentanyl patch for pain relief. He will begin using the carafate and lidocaine rinses later in the week. He is taking nothing orally. They relate his inability to swallow because of his thick saliva which causes nausea. He is using phenergan to help with his nausea. He is instilling 5 cans of osmolite daily through his PEG tube. He is also instilling 24 ounces of water on Sunday, and 16 ounces of water on all other days. He is receiving IV fluids Monday through Saturday in Medical Oncology and will receive them for another 2 weeks. His neck is red, hyperpigmented, with dry areas noted. He is keeping it moist with neosporin and sonafine cream. He is having bowel movements daily, and will use miralax occasionally to relieve constipation.  BP 109/64 mmHg  Pulse 83  Temp(Src) 98 F (36.7 C)  Ht 5' 11.5" (1.816 m)  Wt 183 lb 12.8 oz (83.371 kg)  BMI 25.28 kg/m2   Orthostatics: BP sitting 118/67 pulse 69. BP standing 109/64 pulse 83  Wt Readings from Last 3 Encounters:  02/03/16 183 lb 12.8 oz (83.371 kg)  01/27/16 185 lb 12.8 oz (84.278 kg)  01/21/16 189 lb 9.6 oz (86.002 kg)

## 2016-02-04 ENCOUNTER — Ambulatory Visit (HOSPITAL_BASED_OUTPATIENT_CLINIC_OR_DEPARTMENT_OTHER): Payer: 59

## 2016-02-04 VITALS — BP 120/80 | HR 73 | Temp 98.6°F | Resp 18

## 2016-02-04 DIAGNOSIS — C090 Malignant neoplasm of tonsillar fossa: Secondary | ICD-10-CM | POA: Diagnosis not present

## 2016-02-04 DIAGNOSIS — K1230 Oral mucositis (ulcerative), unspecified: Secondary | ICD-10-CM

## 2016-02-04 MED ORDER — SODIUM CHLORIDE 0.9 % IJ SOLN
10.0000 mL | INTRAMUSCULAR | Status: DC | PRN
  Administered 2016-02-04: 10 mL
  Filled 2016-02-04: qty 10

## 2016-02-04 MED ORDER — HEPARIN SOD (PORK) LOCK FLUSH 100 UNIT/ML IV SOLN
500.0000 [IU] | Freq: Once | INTRAVENOUS | Status: AC | PRN
  Administered 2016-02-04: 500 [IU]
  Filled 2016-02-04: qty 5

## 2016-02-04 MED ORDER — HYDROMORPHONE HCL 4 MG/ML IJ SOLN
1.0000 mg | INTRAMUSCULAR | Status: DC | PRN
  Administered 2016-02-04: 1 mg via INTRAVENOUS

## 2016-02-04 MED ORDER — HYDROMORPHONE HCL 4 MG/ML IJ SOLN
INTRAMUSCULAR | Status: AC
  Filled 2016-02-04: qty 1

## 2016-02-04 MED ORDER — SODIUM CHLORIDE 0.9 % IV SOLN
Freq: Once | INTRAVENOUS | Status: AC
  Administered 2016-02-04: 09:00:00 via INTRAVENOUS

## 2016-02-04 NOTE — Patient Instructions (Signed)

## 2016-02-05 ENCOUNTER — Encounter: Payer: Self-pay | Admitting: Hematology and Oncology

## 2016-02-05 ENCOUNTER — Ambulatory Visit (HOSPITAL_BASED_OUTPATIENT_CLINIC_OR_DEPARTMENT_OTHER): Payer: 59 | Admitting: Hematology and Oncology

## 2016-02-05 ENCOUNTER — Ambulatory Visit: Payer: 59 | Admitting: Nutrition

## 2016-02-05 ENCOUNTER — Other Ambulatory Visit: Payer: Self-pay | Admitting: Hematology and Oncology

## 2016-02-05 ENCOUNTER — Ambulatory Visit (HOSPITAL_BASED_OUTPATIENT_CLINIC_OR_DEPARTMENT_OTHER): Payer: 59

## 2016-02-05 ENCOUNTER — Telehealth: Payer: Self-pay | Admitting: *Deleted

## 2016-02-05 VITALS — BP 116/68 | HR 73 | Temp 98.0°F | Resp 17

## 2016-02-05 DIAGNOSIS — K1230 Oral mucositis (ulcerative), unspecified: Secondary | ICD-10-CM

## 2016-02-05 DIAGNOSIS — L270 Generalized skin eruption due to drugs and medicaments taken internally: Secondary | ICD-10-CM | POA: Diagnosis not present

## 2016-02-05 DIAGNOSIS — C090 Malignant neoplasm of tonsillar fossa: Secondary | ICD-10-CM

## 2016-02-05 DIAGNOSIS — C099 Malignant neoplasm of tonsil, unspecified: Secondary | ICD-10-CM

## 2016-02-05 MED ORDER — FENTANYL 25 MCG/HR TD PT72: 25.0000 ug | MEDICATED_PATCH | TRANSDERMAL | Status: DC

## 2016-02-05 MED ORDER — SODIUM CHLORIDE 0.9 % IV SOLN
Freq: Once | INTRAVENOUS | Status: AC
  Administered 2016-02-05: 09:00:00 via INTRAVENOUS

## 2016-02-05 MED ORDER — HEPARIN SOD (PORK) LOCK FLUSH 100 UNIT/ML IV SOLN
500.0000 [IU] | Freq: Once | INTRAVENOUS | Status: AC | PRN
  Administered 2016-02-05: 500 [IU]
  Filled 2016-02-05: qty 5

## 2016-02-05 MED ORDER — HYDROMORPHONE HCL 4 MG/ML IJ SOLN
1.0000 mg | INTRAMUSCULAR | Status: DC | PRN
  Administered 2016-02-05: 1 mg via INTRAVENOUS

## 2016-02-05 MED ORDER — SODIUM CHLORIDE 0.9 % IJ SOLN
10.0000 mL | INTRAMUSCULAR | Status: DC | PRN
  Administered 2016-02-05: 10 mL
  Filled 2016-02-05: qty 10

## 2016-02-05 MED ORDER — HYDROMORPHONE HCL 4 MG/ML IJ SOLN
INTRAMUSCULAR | Status: AC
  Filled 2016-02-05: qty 1

## 2016-02-05 MED FILL — fentaNYL 25 MCG/HR PT72: 25 | 15 days supply | Qty: 5 | Fill #0

## 2016-02-05 NOTE — Progress Notes (Signed)
Nutrition follow-up completed with patient's wife during IV fluids for tonsil cancer. Patient is now tolerating 5-1/2 cans of Osmolite 1.5 daily. Weight continues to decrease and was documented as 183.8 pounds April 10 decreased from 185.8 pounds April third. Patient is not having nutrition impact symptoms at this time.  Nutrition diagnosis: Inadequate oral intake continues.  Intervention: Provided support and encouragement for wife to continue increasing tube feeding to goal rate of 6.5 cans daily. Recommended adding one packet of Un[place] protein powder providing an additional 100 cal and 20 g protein. Provided samples. Questions were answered.  Teach back method used.  Monitoring, evaluation, goals: Patient will tolerate increase in tube feeding along with protein powder to meet greater than 90% of estimated nutrition needs.  Next visit: Tuesday, April 18, during infusion.  **Disclaimer: This note was dictated with voice recognition software. Similar sounding words can inadvertently be transcribed and this note may contain transcription errors which may not have been corrected upon publication of note.**

## 2016-02-05 NOTE — Progress Notes (Signed)
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 Mild asymmetry of the left tonsil -- correlate with direct visualization to assess for primary neoplasm.   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   11/28/2015 PET scan 1. Left palatine tonsil primary with left worse than right cervical nodal metastasis. 2. No extracervical hypermetabolic metastasis.    12/12/2015 Procedure Peg and port-a-cath placement.   12/18/2015 - 01/29/2016 Chemotherapy He received weekly cetuximab   12/18/2015 - 02/03/2016 Radiation Therapy He received concurrent radiation   12/31/2015 Adverse Reaction Treatment #3 is placed on hold due to worsening mucositis   01/08/2016 Miscellaneous Cetuximab #3 resumed with 50% dose adjustment   01/14/2016 Adverse Reaction Treatment #4 is placed on hold due to worsening mucositis and failure to thrive    INTERVAL HISTORY: Please see below for problem oriented charting. He is seen at the infusion area. His pain is under control with current pain medication regimen. He has significant skin rash from recent treatment, stable Denies nausea vomiting or constipation. No recent infection He is able to tolerate 5 cans of nutritional supplement.  REVIEW OF SYSTEMS:    Constitutional: Denies fevers, chills or abnormal weight loss Eyes: Denies blurriness of vision Ears, nose, mouth, throat, and face: Denies mucositis or sore throat Respiratory: Denies cough, dyspnea or wheezes Cardiovascular: Denies palpitation, chest discomfort or lower extremity swelling Gastrointestinal:  Denies nausea, heartburn or change in bowel habits Lymphatics: Denies new lymphadenopathy or easy bruising Neurological:Denies numbness, tingling or new weaknesses Behavioral/Psych: Mood is stable, no new changes  All other systems were reviewed with the patient and are negative.  I have reviewed the past medical history, past surgical history, social history and family history with the patient and they are unchanged from previous note.  ALLERGIES:  has No Known Allergies.  MEDICATIONS:  Current Outpatient Prescriptions  Medication Sig Dispense Refill  . aspirin EC 81 MG tablet Take 81 mg by mouth daily.    . clindamycin (CLINDAGEL) 1 % gel Apply topically 2 (two) times daily. 30 g 0  . fentaNYL (DURAGESIC - DOSED MCG/HR) 25 MCG/HR patch Place 1 patch (25 mcg total) onto the skin every 3 (three) days. 5 patch 0  . hydrocortisone 1 % ointment Apply 1 application topically 2 (two) times daily. 30 g 0  . ibuprofen (ADVIL,MOTRIN) 200 MG tablet Take 200 mg by mouth as needed. Reported on 01/27/2016    . levothyroxine (SYNTHROID, LEVOTHROID) 75 MCG tablet Take 75 mcg by mouth daily before breakfast.    . lidocaine (XYLOCAINE) 2 % solution Mix 1 part 2%viscous lidocaine,1part H2O.Swish and/or swallow 31mL of this mixture,24min before meals and at bedtime, up to QID (Patient not taking: Reported on 01/20/2016) 100 mL 5  . LORazepam (ATIVAN) 0.5 MG tablet Take 1 tablet up to TID as needed for nausea 30 tablet 0  . losartan (COZAAR) 25 MG  tablet Take 25 mg by mouth every morning. Reported on 01/27/2016  2  . morphine (ROXANOL) 20 MG/ML concentrated solution Place 0.5 mLs (10 mg total) into feeding  tube every 2 (two) hours as needed for severe pain. (Patient not taking: Reported on 01/13/2016) 240 mL 0  . Nutritional Supplements (FEEDING SUPPLEMENT, OSMOLITE 1.5 CAL,) LIQD Begin one can Osmolite 1.5, or equivalent, QID with 30 cc free water before and 60 cc free water after. On day 3, increase to 1 1/2 cans BID and continue 2 cans BID as tolerated.On day 5, increase to 1 1/2 cans QID as tolerated.On day 7, increase to goal rate of 1 1/2 cans TID and two cans once for total of 6.5 cans daily.Flush tube or drink addition 32 oz water daily.Send TF and supplies. 1541 mL 4  . ondansetron (ZOFRAN) 8 MG tablet Take 1 tablet (8 mg total) by mouth every 8 (eight) hours as needed for nausea. 30 tablet 3  . oxyCODONE-acetaminophen (PERCOCET/ROXICET) 5-325 MG tablet Take 1 tablet by mouth every 4 (four) hours as needed for moderate pain. Reported on 12/25/2015 (Patient not taking: Reported on 01/13/2016) 60 tablet 0  . promethazine (PHENERGAN) 25 MG tablet Take 1 tablet (25 mg total) by mouth every 6 (six) hours as needed for nausea. 30 tablet 3  . ranitidine (ZANTAC) 150 MG capsule Take 150 mg by mouth 2 (two) times daily. Reported on 01/27/2016    . scopolamine (TRANSDERM-SCOP) 1 MG/3DAYS Place 1 patch (1.5 mg total) onto the skin every 3 (three) days. (Patient not taking: Reported on 01/20/2016) 10 patch 1  . sodium fluoride (FLUORISHIELD) 1.1 % GEL dental gel Instill one drop of gel per tooth space of fluoride tray. Place over teeth for 5 minutes. Remove. Spit out excess. Repeat nightly. 120 mL PRN  . sucralfate (CARAFATE) 1 g tablet Dissolve 1 tablet in 9mL H2O and swallow up to QID,PRN sore throat. (Patient not taking: Reported on 01/13/2016) 60 tablet 5   No current facility-administered medications for this visit.   Facility-Administered Medications Ordered in Other Visits  Medication Dose Route Frequency Provider Last Rate Last Dose  . heparin lock flush 100 unit/mL  500 Units Intracatheter Once PRN Heath Lark, MD      . HYDROmorphone (DILAUDID) injection 1 mg  1 mg Intravenous PRN Heath Lark, MD   1 mg at 02/05/16 0904  . sodium chloride 0.9 % injection 10 mL  10 mL Intracatheter PRN Heath Lark, MD        PHYSICAL EXAMINATION: ECOG PERFORMANCE STATUS: 1 - Symptomatic but completely ambulatory  Filed Vitals:   02/05/16 0911  BP: 116/68  Pulse: 73  Temp: 98 F (36.7 C)  Resp: 17   There were no vitals filed for this visit.  GENERAL:alert, no distress and comfortable SKIN: He has grade 2 skin rash. No bleeding. EYES: normal, Conjunctiva are pink and non-injected, sclera clear OROPHARYNX: He has grade 1-2 mucositis. No thrush. NECK: supple, thyroid normal size, non-tender, without nodularity LYMPH:  no palpable lymphadenopathy in the cervical, axillary or inguinal LUNGS: clear to auscultation and percussion with normal breathing effort HEART: regular rate & rhythm and no murmurs and no lower extremity edema ABDOMEN:abdomen soft, non-tender and normal bowel sounds. Feeding tube site looks okay Musculoskeletal:no cyanosis of digits and no clubbing  NEURO: alert & oriented x 3 with fluent speech, no focal motor/sensory deficits  LABORATORY DATA:  I have reviewed the data as listed    Component Value Date/Time  NA 139 01/28/2016 0929   NA 136 12/12/2015 1317   K 4.3 01/28/2016 0929   K 3.9 12/12/2015 1317   CL 103 12/12/2015 1317   CO2 30* 01/28/2016 0929   CO2 24 12/12/2015 1317   GLUCOSE 102 01/28/2016 0929   GLUCOSE 91 12/12/2015 1317   BUN 10.4 01/28/2016 0929   BUN 17 12/12/2015 1317   CREATININE 0.9 01/28/2016 0929   CREATININE 1.11 12/12/2015 1317   CALCIUM 9.6 01/28/2016 0929   CALCIUM 9.0 12/12/2015 1317   PROT 7.1 01/28/2016 0929   PROT 7.0 09/24/2009 1331   ALBUMIN 3.1* 01/28/2016 0929   ALBUMIN 4.1 09/24/2009 1331   AST 16 01/28/2016 0929   AST 18 09/24/2009 1331   ALT 17 01/28/2016 0929   ALT 40 09/24/2009 1331   ALKPHOS 77 01/28/2016 0929   ALKPHOS  123* 09/24/2009 1331   BILITOT 0.53 01/28/2016 0929   BILITOT 0.3 09/24/2009 1331   GFRNONAA >60 12/12/2015 1317   GFRAA >60 12/12/2015 1317    No results found for: SPEP, UPEP  Lab Results  Component Value Date   WBC 5.2 01/28/2016   NEUTROABS 4.4 01/28/2016   HGB 13.7 01/28/2016   HCT 41.6 01/28/2016   MCV 93.4 01/28/2016   PLT 191 01/28/2016      Chemistry      Component Value Date/Time   NA 139 01/28/2016 0929   NA 136 12/12/2015 1317   K 4.3 01/28/2016 0929   K 3.9 12/12/2015 1317   CL 103 12/12/2015 1317   CO2 30* 01/28/2016 0929   CO2 24 12/12/2015 1317   BUN 10.4 01/28/2016 0929   BUN 17 12/12/2015 1317   CREATININE 0.9 01/28/2016 0929   CREATININE 1.11 12/12/2015 1317      Component Value Date/Time   CALCIUM 9.6 01/28/2016 0929   CALCIUM 9.0 12/12/2015 1317   ALKPHOS 77 01/28/2016 0929   ALKPHOS 123* 09/24/2009 1331   AST 16 01/28/2016 0929   AST 18 09/24/2009 1331   ALT 17 01/28/2016 0929   ALT 40 09/24/2009 1331   BILITOT 0.53 01/28/2016 0929   BILITOT 0.3 09/24/2009 1331      ASSESSMENT & PLAN:  Tonsil cancer ([place]) He has mild worsening skin toxicity compared to a week ago but overall tolerated treatment fairly well. He has completed both chemotherapy and radiation treatment. I will continue aggressive supportive care and will continue see him on the weekly basis to provide symptom management.  Mucositis oral He has grade 2 mucositis  His pain is well-controlled with current dose of fentanyl  I will see him next week for further assessment    Drug-induced skin rash He has grade 1-2 skin toxicity from cetuximab and radiation I recommend he continues prescription hydrocortisone and topical antibiotic cream. I will reassess next week   No orders of the defined types were placed in this encounter.   All questions were answered. The patient knows to call the clinic with any problems, questions or concerns. No barriers to learning was  detected. I spent 15 minutes counseling the patient face to face. The total time spent in the appointment was 20 minutes and more than 50% was on counseling and review of test results     [facility], [place], MD 02/05/2016 9:27 AM

## 2016-02-05 NOTE — Telephone Encounter (Signed)
Per staff message and POF I have scheduled appts. Advised scheduler of appts. JMW  

## 2016-02-05 NOTE — Assessment & Plan Note (Signed)
He has grade 2 mucositis  His pain is well-controlled with current dose of fentanyl  I will see him next week for further assessment

## 2016-02-05 NOTE — Patient Instructions (Signed)

## 2016-02-05 NOTE — Assessment & Plan Note (Signed)
He has mild worsening skin toxicity compared to a week ago but overall tolerated treatment fairly well. He has completed both chemotherapy and radiation treatment. I will continue aggressive supportive care and will continue see him on the weekly basis to provide symptom management.

## 2016-02-05 NOTE — Assessment & Plan Note (Signed)
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 I will reassess next week

## 2016-02-06 ENCOUNTER — Ambulatory Visit (HOSPITAL_BASED_OUTPATIENT_CLINIC_OR_DEPARTMENT_OTHER): Payer: 59

## 2016-02-06 VITALS — BP 121/75 | HR 72 | Temp 98.7°F | Resp 18

## 2016-02-06 DIAGNOSIS — K1230 Oral mucositis (ulcerative), unspecified: Secondary | ICD-10-CM

## 2016-02-06 DIAGNOSIS — C090 Malignant neoplasm of tonsillar fossa: Secondary | ICD-10-CM

## 2016-02-06 MED ORDER — HYDROMORPHONE HCL 4 MG/ML IJ SOLN
INTRAMUSCULAR | Status: AC
  Filled 2016-02-06: qty 1

## 2016-02-06 MED ORDER — SODIUM CHLORIDE 0.9 % IJ SOLN
10.0000 mL | INTRAMUSCULAR | Status: DC | PRN
  Administered 2016-02-06: 10 mL
  Filled 2016-02-06: qty 10

## 2016-02-06 MED ORDER — HEPARIN SOD (PORK) LOCK FLUSH 100 UNIT/ML IV SOLN
500.0000 [IU] | Freq: Once | INTRAVENOUS | Status: AC | PRN
  Administered 2016-02-06: 500 [IU]
  Filled 2016-02-06: qty 5

## 2016-02-06 MED ORDER — HYDROMORPHONE HCL 4 MG/ML IJ SOLN
1.0000 mg | INTRAMUSCULAR | Status: DC | PRN
  Administered 2016-02-06: 1 mg via INTRAVENOUS

## 2016-02-06 MED ORDER — SODIUM CHLORIDE 0.9 % IV SOLN
Freq: Once | INTRAVENOUS | Status: AC
  Administered 2016-02-06: 08:00:00 via INTRAVENOUS

## 2016-02-06 NOTE — Patient Instructions (Signed)

## 2016-02-06 NOTE — Progress Notes (Signed)
  Oncology Nurse Navigator Documentation  Navigator Location: [facility] Onc (02/03/16 0815) Navigator Encounter Type: Treatment (02/03/16 0815)           Patient Visit Type: UUEKCM (02/03/16 0815) Treatment Phase: Final Radiation Tx (02/03/16 0815)     Met with pt during final RT to offer support and to celebrate end of radiation treatment and afterward during WUT with Dr. Isidore Moos.   He was accompanied by his wife.  I provided wife with a Certificate of Recognition for her supportive care during his treatments.  I provided post-RT guidance:  Importance of keeping follow-up appts with Nutrition and SLP.  Importance of protecting treatment area from sun.  Continuation of Sonafine application 2-3 times daily. They indicated he finds better relief with Neosporin at this point of skin tenderness.  I encouraged them to apply Sonofine when sensitivity resolves.  He reports:  Instillation of 5 cans of Osmolite over the course of 4 feedings daily, instilling 16 oz water on days of IVF, 24 oz when not receiving IVF.  Moderate throat pain that is tolerated with Duragesic patch.  Currently not using lidocaine or sucralfate b/c causes him to gag.  They plan to use as throat soreness diminishes.  Sleeping well.  Nausea controlled with 1/2 tablet phenergan.  I explained that my role as navigator will continue for several more months and that I will be calling and/or joining him during follow-up visits.  I encouraged them to call me with needs/concerns.    Gayleen Orem, RN, BSN, [facility] [place] [phone]                              Time Spent with Patient: 45 (02/03/16 0815)

## 2016-02-07 ENCOUNTER — Ambulatory Visit (HOSPITAL_BASED_OUTPATIENT_CLINIC_OR_DEPARTMENT_OTHER): Payer: 59

## 2016-02-07 VITALS — BP 123/63 | HR 72 | Temp 98.6°F | Resp 18

## 2016-02-07 DIAGNOSIS — K1230 Oral mucositis (ulcerative), unspecified: Secondary | ICD-10-CM | POA: Diagnosis not present

## 2016-02-07 DIAGNOSIS — C090 Malignant neoplasm of tonsillar fossa: Secondary | ICD-10-CM | POA: Diagnosis not present

## 2016-02-07 MED ORDER — HYDROMORPHONE HCL 4 MG/ML IJ SOLN
1.0000 mg | INTRAMUSCULAR | Status: DC | PRN
Start: 2016-02-07 — End: 2016-02-07
  Administered 2016-02-07: 1 mg via INTRAVENOUS

## 2016-02-07 MED ORDER — HYDROMORPHONE HCL 4 MG/ML IJ SOLN
INTRAMUSCULAR | Status: AC
  Filled 2016-02-07: qty 1

## 2016-02-07 MED ORDER — SODIUM CHLORIDE 0.9 % IV SOLN
Freq: Once | INTRAVENOUS | Status: AC
  Administered 2016-02-07: 09:00:00 via INTRAVENOUS

## 2016-02-07 MED ORDER — SODIUM CHLORIDE 0.9 % IJ SOLN
10.0000 mL | INTRAMUSCULAR | Status: DC | PRN
  Administered 2016-02-07: 10 mL
  Filled 2016-02-07: qty 10

## 2016-02-07 MED ORDER — HEPARIN SOD (PORK) LOCK FLUSH 100 UNIT/ML IV SOLN
500.0000 [IU] | Freq: Once | INTRAVENOUS | Status: AC | PRN
  Administered 2016-02-07: 500 [IU]
  Filled 2016-02-07: qty 5

## 2016-02-07 NOTE — Patient Instructions (Signed)

## 2016-02-08 ENCOUNTER — Ambulatory Visit (HOSPITAL_BASED_OUTPATIENT_CLINIC_OR_DEPARTMENT_OTHER): Payer: 59

## 2016-02-08 VITALS — BP 129/62 | HR 67 | Temp 98.6°F

## 2016-02-08 DIAGNOSIS — C090 Malignant neoplasm of tonsillar fossa: Secondary | ICD-10-CM | POA: Diagnosis not present

## 2016-02-08 DIAGNOSIS — K1230 Oral mucositis (ulcerative), unspecified: Secondary | ICD-10-CM

## 2016-02-08 MED ORDER — HYDROMORPHONE HCL 4 MG/ML IJ SOLN
INTRAMUSCULAR | Status: AC
  Filled 2016-02-08: qty 1

## 2016-02-08 MED ORDER — SODIUM CHLORIDE 0.9 % IV SOLN
Freq: Once | INTRAVENOUS | Status: AC
  Administered 2016-02-08: 08:00:00 via INTRAVENOUS

## 2016-02-08 MED ORDER — HEPARIN SOD (PORK) LOCK FLUSH 100 UNIT/ML IV SOLN
250.0000 [IU] | Freq: Once | INTRAVENOUS | Status: DC | PRN
  Filled 2016-02-08: qty 5

## 2016-02-08 MED ORDER — HYDROMORPHONE HCL 4 MG/ML IJ SOLN
1.0000 mg | INTRAMUSCULAR | Status: DC | PRN
  Administered 2016-02-08: 1 mg via INTRAVENOUS

## 2016-02-08 MED ORDER — SODIUM CHLORIDE 0.9 % IV SOLN
Freq: Once | INTRAVENOUS | Status: AC
Start: 2016-02-08 — End: 2016-02-08
  Administered 2016-02-08: 08:00:00 via INTRAVENOUS

## 2016-02-08 MED ORDER — SODIUM CHLORIDE 0.9 % IJ SOLN
10.0000 mL | INTRAMUSCULAR | Status: DC | PRN
  Administered 2016-02-08: 10 mL
  Filled 2016-02-08: qty 10

## 2016-02-08 MED ORDER — HEPARIN SOD (PORK) LOCK FLUSH 100 UNIT/ML IV SOLN
500.0000 [IU] | Freq: Once | INTRAVENOUS | Status: AC | PRN
Start: 2016-02-08 — End: 2016-02-08
  Administered 2016-02-08: 500 [IU]
  Filled 2016-02-08: qty 5

## 2016-02-08 NOTE — Progress Notes (Signed)
Pt rated pain # 5 upon d/c from # 8 before pain med & he states this is tolerable for him.

## 2016-02-08 NOTE — Patient Instructions (Signed)
Oral Mucositis Oral mucositis is a mouth condition that may develop from treatments for cancer. With this condition, sores may appear on your lips, gums, tongue, throat, and the top (roof) or bottom (floor) of your mouth. CAUSES Oral mucositis can happen to anyone who is being treated with cancer therapies, including:  Cancer medicines (chemotherapy).  Radiation therapy.  Bone marrow transplants and stem cell transplants. Cancer treatments can damage the lining of the mouth, and that causes this condition. Oral mucositis is not caused by infection. However, the sores can become infected after they form. Infection can make oral mucositis worse. RISK FACTORS This condition is more likely to develop in people:  With poor oral hygiene.  With dental problems or oral diseases.  Who use any tobacco product, including cigarettes, chewing tobacco, and electronic cigarettes.  Who drink alcohol.  Who have other medical conditions, such as diabetes, HIV, AIDS, or kidney disease.  Who do not drink enough clear fluids.  Who wear dentures that do not fit correctly.  Who have cancers that primarily affect the blood.  Who are male. SYMPTOMS Symptoms can vary from mild to severe. Symptoms are usually seen 7-10 days after cancer treatment has started. Symptoms include:  Mouth sores. These sores may bleed.  Color changes inside the mouth. Red, shiny areas may appear.  White patches or pus in the mouth.  Pain in the mouth and throat. This can make it painful to speak.  Dryness and a burning feeling in the mouth.  Saliva that is dry and thick.  Trouble eating, drinking, and swallowing. This can lead to weight loss. DIAGNOSIS This condition can be diagnosed with a physical exam. TREATMENT Treatment depends on the severity of the condition. Oral mucositis often heals on its own. Sometimes, changes in the cancer treatment can help. Treatment may include medicines, such as:  An antibiotic  medicine to fight infection, if present.  Medicine to help the cells in your mouth heal more quickly. Medicine may also be given to help control pain. This may include:  Pain relievers that are swished around in the mouth. These make the mouth numb to ease the pain (topical anesthetics).   Mouth rinses.  Prescribed, medicated gels. The gel coats the mouth. This protects nerve endings and lessens pain.  Narcotic pain medicines. HOME CARE INSTRUCTIONS Medicines  If you were prescribed an antibiotic medicine, finish all of it even if you start to feel better.  Take or apply medicines only as directed by your health care provider. Lifestyle  Keeping your mouth clean and germ-free is important. To maintain good oral hygiene:  Brush your teeth carefully with a soft, nylon-bristled toothbrush at least two times each day. Use a gentle toothpaste. Ask your health care provider for a toothpaste recommendation.  Floss your teeth every day.  Have your teeth cleaned regularly as recommended by your dentist.  Rinse your mouth after every meal or as directed by your health care provider. Do not use mouthwash that contains alcohol. Ask your health care provider for a mouthwash recommendation.  Do not use any tobacco products, including cigarettes, chewing tobacco, and electronic cigarettes. If you need help quitting, ask your health care provider.  Avoid eating:  Hot and spicy foods.  Citrus.  Foods that have sharp edges, such as chips.  Sugary foods, such as candy.  Do not drink alcohol. General Instructions  Clean the sores as directed by your health care provider.  If your lips are dry or cracked, apply a   water-based moisturizer to your lips as needed.  Try sucking on ice chips or sugar-free frozen pops. This may help with pain. This also keeps your mouth moist.  Drink enough fluid to keep your urine clear or pale yellow.  If you are losing weight, talk with your health care  provider.  If you have dentures, take them out often as directed by your health care provider.  Keep all follow-up visits as directed by your health care provider. This is important. SEEK MEDICAL CARE IF:  You have mouth pain or throat pain.  You are having more trouble swallowing.  Your symptoms get worse.  You have new symptoms.  Your pain is not controlled with medicine. SEEK IMMEDIATE MEDICAL CARE IF:  You have a lot of bleeding in your mouth.  You have trouble speaking.  You develop new, open, or draining sores in your mouth.  You cannot swallow solid food or liquids.  You have a fever.   This information is not intended to replace advice given to you by your health care provider. Make sure you discuss any questions you have with your health care provider.   Document Released: 05/29/2011 Document Revised: 02/26/2015 Document Reviewed: 10/08/2014 Elsevier Interactive Patient Education 2016 [employer] Inc[employer]  

## 2016-02-10 ENCOUNTER — Ambulatory Visit: Payer: Self-pay

## 2016-02-10 ENCOUNTER — Ambulatory Visit: Payer: 59 | Attending: Radiation Oncology

## 2016-02-10 ENCOUNTER — Ambulatory Visit (HOSPITAL_BASED_OUTPATIENT_CLINIC_OR_DEPARTMENT_OTHER): Payer: 59

## 2016-02-10 VITALS — BP 119/69 | HR 72 | Temp 98.4°F | Resp 18 | Ht 71.5 in | Wt 181.3 lb

## 2016-02-10 DIAGNOSIS — K1230 Oral mucositis (ulcerative), unspecified: Secondary | ICD-10-CM | POA: Diagnosis not present

## 2016-02-10 DIAGNOSIS — R1310 Dysphagia, unspecified: Secondary | ICD-10-CM | POA: Diagnosis not present

## 2016-02-10 DIAGNOSIS — C090 Malignant neoplasm of tonsillar fossa: Secondary | ICD-10-CM | POA: Diagnosis not present

## 2016-02-10 MED ORDER — SODIUM CHLORIDE 0.9 % IV SOLN
Freq: Once | INTRAVENOUS | Status: AC
  Administered 2016-02-10: 14:00:00 via INTRAVENOUS

## 2016-02-10 MED ORDER — ALTEPLASE 2 MG IJ SOLR
2.0000 mg | Freq: Once | INTRAMUSCULAR | Status: DC | PRN
  Filled 2016-02-10: qty 2

## 2016-02-10 MED ORDER — HEPARIN SOD (PORK) LOCK FLUSH 100 UNIT/ML IV SOLN
250.0000 [IU] | Freq: Once | INTRAVENOUS | Status: DC | PRN
  Filled 2016-02-10: qty 5

## 2016-02-10 MED ORDER — SODIUM CHLORIDE 0.9 % IJ SOLN
10.0000 mL | INTRAMUSCULAR | Status: DC | PRN
  Administered 2016-02-10: 10 mL
  Filled 2016-02-10: qty 10

## 2016-02-10 MED ORDER — HEPARIN SOD (PORK) LOCK FLUSH 100 UNIT/ML IV SOLN
500.0000 [IU] | Freq: Once | INTRAVENOUS | Status: AC | PRN
  Administered 2016-02-10: 500 [IU]
  Filled 2016-02-10: qty 5

## 2016-02-10 MED ORDER — HYDROMORPHONE HCL 4 MG/ML IJ SOLN
1.0000 mg | INTRAMUSCULAR | Status: DC | PRN
  Administered 2016-02-10 (×2): 1 mg via INTRAVENOUS

## 2016-02-10 MED ORDER — SODIUM CHLORIDE 0.9 % IV SOLN
Freq: Once | INTRAVENOUS | Status: AC
  Administered 2016-02-10: 14:00:00 via INTRAVENOUS
  Filled 2016-02-10: qty 4

## 2016-02-10 MED ORDER — HYDROMORPHONE HCL 4 MG/ML IJ SOLN
INTRAMUSCULAR | Status: AC
  Filled 2016-02-10: qty 1

## 2016-02-10 NOTE — Patient Instructions (Signed)

## 2016-02-10 NOTE — Patient Instructions (Signed)
Remember to try the chin resist exercise.  Try one exercise where you're swallowing once/day. If you can do one rep, try three. If you can do three, try more.

## 2016-02-10 NOTE — Therapy (Signed)
[facility] [address] [place], [place], [address] Phone: [phone]   Fax:  [phone]  Speech Language Pathology Treatment  Patient Details  Name: Gregory Jenkins MRN: [id number] Date of Birth: 01-25-1951 Referring Provider: Eppie Gibson, M.D.  Encounter Date: 02/10/2016      End of Session - 02/10/16 1503    Visit Number 3   Number of Visits 7   Date for SLP Re-Evaluation 06/08/16   SLP Start Time [id number]   SLP Stop Time  [id number]   SLP Time Calculation (min) 25 min   Activity Tolerance Patient limited by pain;Patient limited by lethargy      Past Medical History  Diagnosis Date  . Allergic rhinitis   . Hypertension   . Thyroid disease   . Clotting disorder (HCC)     PE, suspect lupus anticoagulant  . History of pulmonary embolism   . History of nephrolithiasis   . Drug-induced skin rash 12/25/2015  . Mucositis oral 01/08/2016    Past Surgical History  Procedure Laterality Date  . Eye surgery Right 1980's    H/O right orbital "blowout" fracture  . Foot surgery Left     [place] Neuroma  . Knee surgery Right     Arthroscopic    There were no vitals filed for this visit.      Subjective Assessment - 02/10/16 1449    Subjective Pt finished 02-03-16 with rad tx.   Patient is accompained by: Family member  wife               ADULT SLP TREATMENT - 02/10/16 1450    General Information   Behavior/Cognition Cooperative;Lethargic   Patient Positioning Partially reclined   HPI Pt was receiving fluids in treatment room during session, and had been given pain meds prior to SLP entering room.   Treatment Provided   Treatment provided Dysphagia   Dysphagia Treatment   Temperature Spikes Noted No   Respiratory Status Room air   Treatment Methods Patient/caregiver education   Patient observed directly with PO's No   Other treatment/comments Pt has been relying more on PEG for approx last 4 weeks due  to gagging/vomiting when attempting POs. He has been completing vocal adduction and high "eee" because attempts at swallowing result in gagging/emesis. SLP reminded pt and wife of chin resist exercise and encouraged participation with it. SLP told pt/wife for pt to attempt one exercise involving swallowing once a day and if pt can tolerate, do 3 reps. If can do 3 reps, do as many as he can. SLP stressed getting back to performing HEP like he was prior was the goal. SLP encouraged pt to cont to try PO liquids (pt took 4 sips tea yesterday and "It felt good.") as long as there is no coughing/choking/excessive throat clearing.    Assessment / Recommendations / Plan   Plan Continue with current plan of care   Progression Toward Goals   Progression toward goals Progressing toward goals          SLP Education - 02/10/16 1502    Education provided Yes   Education Details chin resist exercise, advancement of HEP   Person(s) Educated Patient;Spouse   Methods Explanation;Demonstration   Comprehension Verbalized understanding;Verbal cues required          SLP Short Term Goals - 02/10/16 1506    SLP SHORT TERM GOAL #1   Title pt will perform HEP with rare min A   Time 1  Period --  visits   Status On-going   SLP SHORT TERM GOAL #2   Title pt will tell SLP why he is completing HEP   Time 1   Period --  visits   Status On-going   SLP SHORT TERM GOAL #3   Title pt will tell SLP 3 s/s aspiration PNA with modified independence   Period --  visits   Status Achieved          SLP Long Term Goals - 02/10/16 1506    SLP LONG TERM GOAL #1   Title pt will perform HEP with modified independence over two sessions   Time 3   Period --  visits   Status On-going   SLP LONG TERM GOAL #2   Title pt will tell SLP how a food journal can expediate return to regular diet following chemo/rad tx   Time 3   Period --  visits   Status On-going   SLP LONG TERM GOAL #3   Title pt will maintain  performance of HEP independently over three sessions   Time 5   Period --  visits   Status On-going   SLP LONG TERM GOAL #4   Title pt will tell SLP date he modifies HEP frequency to 2-3x/week over two sessions   Time 6   Period --  visits   Status On-going          Plan - 02/10/16 1503    Clinical Impression Statement Pt presents today not unlike average patient one week out of RT. SLP encouraged pt/wife to advance HEP frequency and breadth of exercise as pt tolerates. Encouraged POs as tolerated. Pt would benefit from cont'd ST to assess HEP procedure and assess safety with POs.   Speech Therapy Frequency --  approx every four weeks   Duration --  4 visits   Treatment/Interventions Aspiration precaution training;Pharyngeal strengthening exercises;Compensatory techniques;Diet toleration management by SLP;Trials of upgraded texture/liquids;Patient/family education;Compensatory strategies;SLP instruction and feedback   Potential to Achieve Goals Good   Potential Considerations Cooperation/participation level  pain meds (lethargy)- today's visit   [place] provided today   Consulted and Agree with Plan of Care Patient      Patient will benefit from skilled therapeutic intervention in order to improve the following deficits and impairments:   Dysphagia    Problem List Patient Active Problem List   Diagnosis Date Noted  . Gagging episode 01/14/2016  . Mucositis oral 01/08/2016  . Drug-induced skin rash 12/25/2015  . 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 Protein-calorie malnutrition, mild ([place]) 12/25/2015  . Drug-induced hyperkalemia 12/10/2015  . 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 Tonsil cancer ([place]) 11/25/2015    [facility] ,[place], CCC-SLP  02/10/2016, 3:07 PM  [facility] [address] [place], [place], [address] Phone: [phone]   Fax:  [phone]   Name: Gregory Jenkins MRN: [id number] Date of Birth: 01-28-1951

## 2016-02-11 ENCOUNTER — Ambulatory Visit: Payer: 59 | Admitting: Nutrition

## 2016-02-11 ENCOUNTER — Ambulatory Visit (HOSPITAL_BASED_OUTPATIENT_CLINIC_OR_DEPARTMENT_OTHER): Payer: 59

## 2016-02-11 ENCOUNTER — Other Ambulatory Visit: Payer: Self-pay | Admitting: Hematology and Oncology

## 2016-02-11 VITALS — BP 113/61 | HR 60 | Temp 98.9°F | Resp 18

## 2016-02-11 DIAGNOSIS — K1230 Oral mucositis (ulcerative), unspecified: Secondary | ICD-10-CM | POA: Diagnosis not present

## 2016-02-11 DIAGNOSIS — C090 Malignant neoplasm of tonsillar fossa: Secondary | ICD-10-CM | POA: Diagnosis not present

## 2016-02-11 MED ORDER — HEPARIN SOD (PORK) LOCK FLUSH 100 UNIT/ML IV SOLN
500.0000 [IU] | Freq: Once | INTRAVENOUS | Status: AC | PRN
  Administered 2016-02-11: 500 [IU]
  Filled 2016-02-11: qty 5

## 2016-02-11 MED ORDER — HYDROMORPHONE HCL 4 MG/ML IJ SOLN
INTRAMUSCULAR | Status: AC
  Filled 2016-02-11: qty 1

## 2016-02-11 MED ORDER — SODIUM CHLORIDE 0.9 % IV SOLN
Freq: Once | INTRAVENOUS | Status: AC
  Administered 2016-02-11: 09:00:00 via INTRAVENOUS
  Filled 2016-02-11: qty 4

## 2016-02-11 MED ORDER — SODIUM CHLORIDE 0.9 % IV SOLN
Freq: Once | INTRAVENOUS | Status: AC
  Administered 2016-02-11: 09:00:00 via INTRAVENOUS

## 2016-02-11 MED ORDER — HYDROMORPHONE HCL 4 MG/ML IJ SOLN
1.0000 mg | Freq: Once | INTRAMUSCULAR | Status: AC
Start: 2016-02-11 — End: 2016-02-11
  Administered 2016-02-11: 1 mg via INTRAVENOUS

## 2016-02-11 MED ORDER — HYDROMORPHONE HCL 4 MG/ML IJ SOLN
1.0000 mg | INTRAMUSCULAR | Status: DC | PRN
  Administered 2016-02-11: 1 mg via INTRAVENOUS

## 2016-02-11 MED ORDER — SODIUM CHLORIDE 0.9 % IJ SOLN
10.0000 mL | INTRAMUSCULAR | Status: DC | PRN
  Administered 2016-02-11: 10 mL
  Filled 2016-02-11: qty 10

## 2016-02-11 NOTE — Progress Notes (Signed)
Nutrition follow-up completed with patient's wife during IV fluids for tonsil cancer. Patient is now tolerating 6 cans of Osmolite 1.5 daily. Weight continues to decrease and was documented as 181.3 pounds down from 183.8 pounds April 10. Patient continues to have thickened saliva. Patient was able to tolerate sips of water yesterday. Pain continues to be an issue however, it is controlled on current pain medication.  Nutrition diagnosis: Inadequate oral intake continues.  Intervention:  Provided support and encouragement to continue increasing tube feeding to goal rate of 6.5 cans daily. Recommended adding one packet of [place] protein powder providing an additional 100 cal and 20 g protein. Provided samples. Questions were answered. Teach back method used.  Monitoring, evaluation, goals:  Patient will tolerate increase in tube feeding along with protein powder to meet greater than 90% of estimated nutrition needs.  Next visit: Thursday, April 27, during infusion.  **Disclaimer: This note was dictated with voice recognition software. Similar sounding words can inadvertently be transcribed and this note may contain transcription errors which may not have been corrected upon publication of note.**

## 2016-02-11 NOTE — Patient Instructions (Signed)

## 2016-02-12 ENCOUNTER — Ambulatory Visit (HOSPITAL_BASED_OUTPATIENT_CLINIC_OR_DEPARTMENT_OTHER): Payer: 59

## 2016-02-12 ENCOUNTER — Ambulatory Visit (HOSPITAL_BASED_OUTPATIENT_CLINIC_OR_DEPARTMENT_OTHER): Payer: 59 | Admitting: Hematology and Oncology

## 2016-02-12 ENCOUNTER — Other Ambulatory Visit: Payer: Self-pay | Admitting: Hematology and Oncology

## 2016-02-12 ENCOUNTER — Encounter: Payer: Self-pay | Admitting: Hematology and Oncology

## 2016-02-12 VITALS — BP 121/72 | HR 67 | Temp 98.2°F | Resp 16

## 2016-02-12 DIAGNOSIS — L589 Radiodermatitis, unspecified: Secondary | ICD-10-CM | POA: Insufficient documentation

## 2016-02-12 DIAGNOSIS — C099 Malignant neoplasm of tonsil, unspecified: Secondary | ICD-10-CM | POA: Diagnosis not present

## 2016-02-12 DIAGNOSIS — K1230 Oral mucositis (ulcerative), unspecified: Secondary | ICD-10-CM

## 2016-02-12 DIAGNOSIS — R198 Other specified symptoms and signs involving the digestive system and abdomen: Secondary | ICD-10-CM

## 2016-02-12 DIAGNOSIS — E441 Mild protein-calorie malnutrition: Secondary | ICD-10-CM

## 2016-02-12 DIAGNOSIS — C090 Malignant neoplasm of tonsillar fossa: Secondary | ICD-10-CM

## 2016-02-12 MED ORDER — HYDROMORPHONE HCL 4 MG/ML IJ SOLN
INTRAMUSCULAR | Status: AC
  Filled 2016-02-12: qty 1

## 2016-02-12 MED ORDER — HEPARIN SOD (PORK) LOCK FLUSH 100 UNIT/ML IV SOLN
500.0000 [IU] | Freq: Once | INTRAVENOUS | Status: AC | PRN
  Administered 2016-02-12: 500 [IU]
  Filled 2016-02-12: qty 5

## 2016-02-12 MED ORDER — HYDROMORPHONE HCL 4 MG/ML IJ SOLN
2.0000 mg | INTRAMUSCULAR | Status: DC | PRN
  Administered 2016-02-12: 2 mg via INTRAVENOUS

## 2016-02-12 MED ORDER — SODIUM CHLORIDE 0.9 % IV SOLN
Freq: Once | INTRAVENOUS | Status: AC
  Administered 2016-02-12: 08:00:00 via INTRAVENOUS

## 2016-02-12 MED ORDER — SODIUM CHLORIDE 0.9 % IJ SOLN
10.0000 mL | INTRAMUSCULAR | Status: DC | PRN
  Administered 2016-02-12: 10 mL
  Filled 2016-02-12: qty 10

## 2016-02-12 MED ORDER — SODIUM CHLORIDE 0.9 % IV SOLN
Freq: Once | INTRAVENOUS | Status: AC
  Administered 2016-02-12: 08:00:00 via INTRAVENOUS
  Filled 2016-02-12: qty 4

## 2016-02-12 NOTE — Assessment & Plan Note (Signed)
He is struggling with oral intake and has lost weight but overall feeling better since we started him on IV fluid hydration therapy He is 100% dependent on feeding tube now I recommend IV fluid support daily and encourage him to try oral intake

## 2016-02-12 NOTE — Progress Notes (Signed)
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 Mild asymmetry of the left tonsil -- correlate with direct visualization to assess for primary neoplasm.   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   11/28/2015 PET scan 1. Left palatine tonsil primary with left worse than right cervical nodal metastasis. 2. No extracervical hypermetabolic metastasis.    12/12/2015 Procedure Peg and port-a-cath placement.   12/18/2015 - 01/29/2016 Chemotherapy He received weekly cetuximab   12/18/2015 - 02/03/2016 Radiation Therapy He received concurrent radiation   12/31/2015 Adverse Reaction Treatment #3 is placed on hold due to worsening mucositis   01/08/2016 Miscellaneous Cetuximab #3 resumed with 50% dose adjustment   01/14/2016 Adverse Reaction Treatment #4 is placed on hold due to worsening mucositis and failure to thrive    INTERVAL HISTORY: Please see below for problem oriented charting. The patient is seen in the infusion room. His pain appears to be better controlled with current dose of fentanyl patch and IV Dilaudid. He rarely takes for breakthrough pain medicine at home. He is encouraged to try oral intake. He continues to have significant mucus production but less compared to last  week. He is able to get 5-6 cans of nutritional supplement on a regular basis without nausea or vomiting. He has occasional constipation, resolved with laxatives.  REVIEW OF SYSTEMS:   Constitutional: Denies fevers, chills or abnormal weight loss Eyes: Denies blurriness of vision Respiratory: Denies cough, dyspnea or wheezes Cardiovascular: Denies palpitation, chest discomfort or lower extremity swelling Lymphatics: Denies new lymphadenopathy or easy bruising Neurological:Denies numbness, tingling or new weaknesses Behavioral/Psych: Mood is stable, no new changes  All other systems were reviewed with the patient and are negative.  I have reviewed the past medical history, past surgical history, social history and family history with the patient and they are unchanged from previous note.  ALLERGIES:  has No Known Allergies.  MEDICATIONS:  Current Outpatient Prescriptions  Medication Sig Dispense Refill  . aspirin EC 81 MG tablet Take 81 mg by mouth daily.    . clindamycin (CLINDAGEL) 1 % gel Apply topically 2 (two) times daily. 30 g 0  . fentaNYL (DURAGESIC - DOSED MCG/HR) 25 MCG/HR patch Place 1 patch (25 mcg total) onto the skin every 3 (three) days. 5 patch 0  . hydrocortisone 1 % ointment Apply 1 application topically 2 (two) times daily. 30 g 0  . ibuprofen (ADVIL,MOTRIN) 200 MG tablet Take 200 mg by mouth as needed. Reported on 01/27/2016    . levothyroxine (SYNTHROID, LEVOTHROID) 75 MCG tablet Take 75 mcg by mouth daily before breakfast.    . lidocaine (XYLOCAINE) 2 % solution Mix 1 part 2%viscous lidocaine,1part H2O.Swish and/or swallow 71mL of this mixture,80min before meals and at bedtime, up to QID (Patient not taking: Reported on 01/20/2016) 100 mL 5  . LORazepam (ATIVAN) 0.5 MG tablet Take 1 tablet up to TID  as needed for nausea 30 tablet 0  . losartan (COZAAR) 25 MG tablet Take 25 mg by mouth every morning. Reported on 01/27/2016  2  . morphine (ROXANOL) 20 MG/ML concentrated  solution Place 0.5 mLs (10 mg total) into feeding tube every 2 (two) hours as needed for severe pain. (Patient not taking: Reported on 01/13/2016) 240 mL 0  . Nutritional Supplements (FEEDING SUPPLEMENT, OSMOLITE 1.5 CAL,) LIQD Begin one can Osmolite 1.5, or equivalent, QID with 30 cc free water before and 60 cc free water after. On day 3, increase to 1 1/2 cans BID and continue 2 cans BID as tolerated.On day 5, increase to 1 1/2 cans QID as tolerated.On day 7, increase to goal rate of 1 1/2 cans TID and two cans once for total of 6.5 cans daily.Flush tube or drink addition 32 oz water daily.Send TF and supplies. 1541 mL 4  . ondansetron (ZOFRAN) 8 MG tablet Take 1 tablet (8 mg total) by mouth every 8 (eight) hours as needed for nausea. 30 tablet 3  . oxyCODONE-acetaminophen (PERCOCET/ROXICET) 5-325 MG tablet Take 1 tablet by mouth every 4 (four) hours as needed for moderate pain. Reported on 12/25/2015 (Patient not taking: Reported on 01/13/2016) 60 tablet 0  . promethazine (PHENERGAN) 25 MG tablet Take 1 tablet (25 mg total) by mouth every 6 (six) hours as needed for nausea. 30 tablet 3  . ranitidine (ZANTAC) 150 MG capsule Take 150 mg by mouth 2 (two) times daily. Reported on 01/27/2016    . scopolamine (TRANSDERM-SCOP) 1 MG/3DAYS Place 1 patch (1.5 mg total) onto the skin every 3 (three) days. (Patient not taking: Reported on 01/20/2016) 10 patch 1  . sodium fluoride (FLUORISHIELD) 1.1 % GEL dental gel Instill one drop of gel per tooth space of fluoride tray. Place over teeth for 5 minutes. Remove. Spit out excess. Repeat nightly. 120 mL PRN  . sucralfate (CARAFATE) 1 g tablet Dissolve 1 tablet in 52mL H2O and swallow up to QID,PRN sore throat. (Patient not taking: Reported on 01/13/2016) 60 tablet 5   No current facility-administered medications for this visit.   Facility-Administered Medications Ordered in Other Visits  Medication Dose Route Frequency Provider Last Rate Last Dose  . HYDROmorphone  (DILAUDID) injection 2 mg  2 mg Intravenous PRN Heath Lark, MD   2 mg at 02/12/16 0815  . sodium chloride 0.9 % injection 10 mL  10 mL Intracatheter PRN Heath Lark, MD   10 mL at 02/12/16 1016    PHYSICAL EXAMINATION: ECOG PERFORMANCE STATUS: 1 - Symptomatic but completely ambulatory  Filed Vitals:   02/12/16 0838  BP: 121/72  Pulse: 67  Temp: 98.2 F (36.8 C)  Resp: 16   There were no vitals filed for this visit.  GENERAL:alert, no distress and comfortable SKIN: Noted significant grade 1-2 dermatitis around his neck EYES: normal, Conjunctiva are pink and non-injected, sclera clear OROPHARYNX:no exudate, no erythema and lips, buccal mucosa, and tongue normal  NEURO: alert & oriented x 3 with fluent speech, no focal motor/sensory deficits  LABORATORY DATA:  I have reviewed the data as listed    Component Value Date/Time   NA 139 01/28/2016 0929   NA 136 12/12/2015 1317   K 4.3 01/28/2016 0929   K 3.9 12/12/2015 1317   CL 103 12/12/2015 1317   CO2 30* 01/28/2016 0929   CO2 24 12/12/2015 1317   GLUCOSE 102 01/28/2016 0929   GLUCOSE 91 12/12/2015 1317   BUN 10.4 01/28/2016 0929  BUN 17 12/12/2015 1317   CREATININE 0.9 01/28/2016 0929   CREATININE 1.11 12/12/2015 1317   CALCIUM 9.6 01/28/2016 0929   CALCIUM 9.0 12/12/2015 1317   PROT 7.1 01/28/2016 0929   PROT 7.0 09/24/2009 1331   ALBUMIN 3.1* 01/28/2016 0929   ALBUMIN 4.1 09/24/2009 1331   AST 16 01/28/2016 0929   AST 18 09/24/2009 1331   ALT 17 01/28/2016 0929   ALT 40 09/24/2009 1331   ALKPHOS 77 01/28/2016 0929   ALKPHOS 123* 09/24/2009 1331   BILITOT 0.53 01/28/2016 0929   BILITOT 0.3 09/24/2009 1331   GFRNONAA >60 12/12/2015 1317   GFRAA >60 12/12/2015 1317    No results found for: SPEP, UPEP  Lab Results  Component Value Date   WBC 5.2 01/28/2016   NEUTROABS 4.4 01/28/2016   HGB 13.7 01/28/2016   HCT 41.6 01/28/2016   MCV 93.4 01/28/2016   PLT 191 01/28/2016      Chemistry      Component  Value Date/Time   NA 139 01/28/2016 0929   NA 136 12/12/2015 1317   K 4.3 01/28/2016 0929   K 3.9 12/12/2015 1317   CL 103 12/12/2015 1317   CO2 30* 01/28/2016 0929   CO2 24 12/12/2015 1317   BUN 10.4 01/28/2016 0929   BUN 17 12/12/2015 1317   CREATININE 0.9 01/28/2016 0929   CREATININE 1.11 12/12/2015 1317      Component Value Date/Time   CALCIUM 9.6 01/28/2016 0929   CALCIUM 9.0 12/12/2015 1317   ALKPHOS 77 01/28/2016 0929   ALKPHOS 123* 09/24/2009 1331   AST 16 01/28/2016 0929   AST 18 09/24/2009 1331   ALT 17 01/28/2016 0929   ALT 40 09/24/2009 1331   BILITOT 0.53 01/28/2016 0929   BILITOT 0.3 09/24/2009 1331      ASSESSMENT & PLAN:  Tonsil cancer ([place]) He has mild worsening skin toxicity compared to a week ago but overall is improving He has completed both chemotherapy and radiation treatment. I will continue aggressive supportive care and will continue see him on the weekly basis to provide symptom management.  Gagging episode He had frequent gagging from mucous production. 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 Continue conservative management with topical emollient cream.   No orders of the defined types were placed in this encounter.   All questions were answered. The patient knows to call the clinic with any problems, questions or concerns. No barriers to learning was detected. I  spent 15 minutes counseling the patient face to face. The total time spent in the appointment was 20 minutes and more than 50% was on counseling and review of test results     [facility], [place], MD 02/12/2016 11:47 AM

## 2016-02-12 NOTE — Assessment & Plan Note (Signed)
He had frequent gagging from mucous production. This is gradually becoming less frequent Continue aggressive fluid hydration

## 2016-02-12 NOTE — Assessment & Plan Note (Signed)
He has mild worsening skin toxicity compared to a week ago but overall is improving He has completed both chemotherapy and radiation treatment. I will continue aggressive supportive care and will continue see him on the weekly basis to provide symptom management.

## 2016-02-12 NOTE — Patient Instructions (Signed)

## 2016-02-12 NOTE — Assessment & Plan Note (Signed)
He has grade 2 mucositis  His pain is well-controlled with current dose of fentanyl  I will see him next week for further assessment

## 2016-02-12 NOTE — Assessment & Plan Note (Signed)
He continues to have grade 1-2 skin toxicity but overall is slowly healing. Continue conservative management with topical emollient cream.

## 2016-02-13 ENCOUNTER — Ambulatory Visit (HOSPITAL_BASED_OUTPATIENT_CLINIC_OR_DEPARTMENT_OTHER): Payer: 59

## 2016-02-13 VITALS — BP 100/55 | HR 66 | Temp 98.4°F | Resp 18

## 2016-02-13 DIAGNOSIS — K1230 Oral mucositis (ulcerative), unspecified: Secondary | ICD-10-CM

## 2016-02-13 DIAGNOSIS — C090 Malignant neoplasm of tonsillar fossa: Secondary | ICD-10-CM | POA: Diagnosis not present

## 2016-02-13 MED ORDER — HEPARIN SOD (PORK) LOCK FLUSH 100 UNIT/ML IV SOLN
500.0000 [IU] | Freq: Once | INTRAVENOUS | Status: AC | PRN
  Administered 2016-02-13: 500 [IU]
  Filled 2016-02-13: qty 5

## 2016-02-13 MED ORDER — SODIUM CHLORIDE 0.9 % IV SOLN
Freq: Once | INTRAVENOUS | Status: AC
  Administered 2016-02-13: 09:00:00 via INTRAVENOUS
  Filled 2016-02-13: qty 4

## 2016-02-13 MED ORDER — SODIUM CHLORIDE 0.9 % IJ SOLN
10.0000 mL | INTRAMUSCULAR | Status: DC | PRN
  Administered 2016-02-13: 10 mL
  Filled 2016-02-13: qty 10

## 2016-02-13 MED ORDER — HYDROMORPHONE HCL 4 MG/ML IJ SOLN
INTRAMUSCULAR | Status: AC
  Filled 2016-02-13: qty 1

## 2016-02-13 MED ORDER — SODIUM CHLORIDE 0.9 % IV SOLN
Freq: Once | INTRAVENOUS | Status: AC
  Administered 2016-02-13: 09:00:00 via INTRAVENOUS

## 2016-02-13 MED ORDER — HYDROMORPHONE HCL 4 MG/ML IJ SOLN
2.0000 mg | INTRAMUSCULAR | Status: DC | PRN
  Administered 2016-02-13: 2 mg via INTRAVENOUS

## 2016-02-13 NOTE — Patient Instructions (Signed)

## 2016-02-14 ENCOUNTER — Ambulatory Visit (HOSPITAL_BASED_OUTPATIENT_CLINIC_OR_DEPARTMENT_OTHER): Payer: 59

## 2016-02-14 VITALS — BP 99/54 | HR 62 | Temp 98.3°F

## 2016-02-14 DIAGNOSIS — K1230 Oral mucositis (ulcerative), unspecified: Secondary | ICD-10-CM | POA: Diagnosis not present

## 2016-02-14 DIAGNOSIS — C090 Malignant neoplasm of tonsillar fossa: Secondary | ICD-10-CM | POA: Diagnosis not present

## 2016-02-14 MED ORDER — SODIUM CHLORIDE 0.9 % IV SOLN
Freq: Once | INTRAVENOUS | Status: AC
  Administered 2016-02-14: 08:00:00 via INTRAVENOUS

## 2016-02-14 MED ORDER — HEPARIN SOD (PORK) LOCK FLUSH 100 UNIT/ML IV SOLN
500.0000 [IU] | Freq: Once | INTRAVENOUS | Status: AC | PRN
  Administered 2016-02-14: 500 [IU]
  Filled 2016-02-14: qty 5

## 2016-02-14 MED ORDER — SODIUM CHLORIDE 0.9 % IV SOLN
Freq: Once | INTRAVENOUS | Status: AC
  Administered 2016-02-14: 09:00:00 via INTRAVENOUS
  Filled 2016-02-14: qty 4

## 2016-02-14 MED ORDER — HYDROMORPHONE HCL 4 MG/ML IJ SOLN
2.0000 mg | INTRAMUSCULAR | Status: DC | PRN
  Administered 2016-02-14: 2 mg via INTRAVENOUS

## 2016-02-14 MED ORDER — SODIUM CHLORIDE 0.9 % IJ SOLN
10.0000 mL | INTRAMUSCULAR | Status: DC | PRN
  Administered 2016-02-14: 10 mL
  Filled 2016-02-14: qty 10

## 2016-02-14 MED ORDER — HYDROMORPHONE HCL 4 MG/ML IJ SOLN
INTRAMUSCULAR | Status: AC
  Filled 2016-02-14: qty 1

## 2016-02-14 NOTE — Progress Notes (Signed)
Gregory Jenkins presents for follow up of radiation completed 02/03/16 to his Tonsil.    Pain issues, if any: He rates his pain a 5/10 in his tongue, and "where it attaches to his throat". He is using a  25 mcg Fentanyl patch, and Roxicet about 1-2 times a day. He just started the Roxicet in the past week. He is taking the pain medicine before he eats. He is using the lidocaine rinses and carafate also.   Using a feeding tube?: He is instilling Osmolite at 6-6 1/2 cans daily with 16 ounces of water through his PEG tube.  Weight changes, if any:  Wt Readings from Last 3 Encounters:  02/19/16 186 lb 11.2 oz (84.687 kg)  02/10/16 181 lb 4.8 oz (82.237 kg)  02/03/16 183 lb 12.8 oz (83.371 kg)   Swallowing issues, if any: He is able to swallow slightly better after he takes pain medicine. He is taking water, jello, and experimenting with certain other foods to test how he is able to swallow.  Smoking or chewing tobacco? No Using fluoride trays daily? He is using them daily. Last ENT visit was on: Not since diagnosis. Other notable issues, if any: He is receiving IV Fluids this week in the [place] for 6 days. Next week the plan is for him to have IV Fluids for 3 days only, and follow up with Dr. Alvy Bimler.  His skin is improved, it is still red, raw appearing. He is using neosporin and sonafine to his neck twice daily.   BP 105/57 mmHg  Pulse 68  Temp(Src) 97.9 F (36.6 C)  Ht 5' 11.5" (1.816 m)  Wt 186 lb 11.2 oz (84.687 kg)  BMI 25.68 kg/m2  SpO2 98%

## 2016-02-14 NOTE — Patient Instructions (Signed)

## 2016-02-15 ENCOUNTER — Ambulatory Visit (HOSPITAL_BASED_OUTPATIENT_CLINIC_OR_DEPARTMENT_OTHER): Payer: 59

## 2016-02-15 VITALS — BP 109/59 | HR 70 | Temp 98.6°F | Resp 16

## 2016-02-15 DIAGNOSIS — C090 Malignant neoplasm of tonsillar fossa: Secondary | ICD-10-CM

## 2016-02-15 DIAGNOSIS — K1230 Oral mucositis (ulcerative), unspecified: Secondary | ICD-10-CM

## 2016-02-15 MED ORDER — HEPARIN SOD (PORK) LOCK FLUSH 100 UNIT/ML IV SOLN
500.0000 [IU] | Freq: Once | INTRAVENOUS | Status: AC | PRN
  Administered 2016-02-15: 500 [IU]
  Filled 2016-02-15: qty 5

## 2016-02-15 MED ORDER — ALTEPLASE 2 MG IJ SOLR
2.0000 mg | Freq: Once | INTRAMUSCULAR | Status: DC | PRN
  Filled 2016-02-15: qty 2

## 2016-02-15 MED ORDER — SODIUM CHLORIDE 0.9 % IV SOLN
Freq: Once | INTRAVENOUS | Status: AC
  Administered 2016-02-15: 08:00:00 via INTRAVENOUS

## 2016-02-15 MED ORDER — HYDROMORPHONE HCL 4 MG/ML IJ SOLN
INTRAMUSCULAR | Status: AC
  Filled 2016-02-15: qty 1

## 2016-02-15 MED ORDER — SODIUM CHLORIDE 0.9 % IJ SOLN
10.0000 mL | INTRAMUSCULAR | Status: DC | PRN
  Administered 2016-02-15: 10 mL
  Filled 2016-02-15: qty 10

## 2016-02-15 MED ORDER — HYDROMORPHONE HCL 4 MG/ML IJ SOLN
2.0000 mg | INTRAMUSCULAR | Status: DC | PRN
  Administered 2016-02-15: 2 mg via INTRAVENOUS

## 2016-02-15 MED ORDER — SODIUM CHLORIDE 0.9 % IV SOLN
Freq: Once | INTRAVENOUS | Status: AC
  Administered 2016-02-15: 09:00:00 via INTRAVENOUS

## 2016-02-15 MED ORDER — HEPARIN SOD (PORK) LOCK FLUSH 100 UNIT/ML IV SOLN
250.0000 [IU] | Freq: Once | INTRAVENOUS | Status: DC | PRN
  Filled 2016-02-15: qty 5

## 2016-02-15 NOTE — Patient Instructions (Signed)

## 2016-02-15 NOTE — Progress Notes (Signed)
  Radiation Oncology         (336) [phone] ________________________________  Name: Gregory Jenkins MRN: [id number]  Date: 02/03/2016  DOB: 10/17/1951  End of Treatment Note  Diagnosis:    ICD-9-CM ICD-10-CM   1. 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   He received IV fluids as needed.  He had significant mucositis and skin desquamation.  Plan: The patient has completed radiation treatment. The patient will return to radiation oncology clinic for routine followup in about one half month. I advised them to call or return sooner if they have any questions or concerns related to their recovery or treatment.  -----------------------------------  Eppie Gibson, MD

## 2016-02-17 ENCOUNTER — Ambulatory Visit (HOSPITAL_BASED_OUTPATIENT_CLINIC_OR_DEPARTMENT_OTHER): Payer: 59 | Admitting: Hematology and Oncology

## 2016-02-17 ENCOUNTER — Encounter: Payer: Self-pay | Admitting: *Deleted

## 2016-02-17 ENCOUNTER — Encounter: Payer: Self-pay | Admitting: Hematology and Oncology

## 2016-02-17 ENCOUNTER — Ambulatory Visit (HOSPITAL_BASED_OUTPATIENT_CLINIC_OR_DEPARTMENT_OTHER): Payer: 59

## 2016-02-17 VITALS — BP 119/67 | HR 66 | Temp 98.2°F | Resp 17

## 2016-02-17 VITALS — BP 119/67 | HR 66 | Temp 98.2°F | Resp 17 | Ht 71.5 in

## 2016-02-17 DIAGNOSIS — E441 Mild protein-calorie malnutrition: Secondary | ICD-10-CM | POA: Diagnosis not present

## 2016-02-17 DIAGNOSIS — C099 Malignant neoplasm of tonsil, unspecified: Secondary | ICD-10-CM | POA: Diagnosis not present

## 2016-02-17 DIAGNOSIS — K1230 Oral mucositis (ulcerative), unspecified: Secondary | ICD-10-CM | POA: Diagnosis not present

## 2016-02-17 DIAGNOSIS — C090 Malignant neoplasm of tonsillar fossa: Secondary | ICD-10-CM | POA: Diagnosis not present

## 2016-02-17 DIAGNOSIS — L589 Radiodermatitis, unspecified: Secondary | ICD-10-CM | POA: Diagnosis not present

## 2016-02-17 MED ORDER — SODIUM CHLORIDE 0.9 % IV SOLN
Freq: Once | INTRAVENOUS | Status: AC
  Administered 2016-02-17: 14:00:00 via INTRAVENOUS

## 2016-02-17 MED ORDER — HYDROMORPHONE HCL 4 MG/ML IJ SOLN
INTRAMUSCULAR | Status: AC
  Filled 2016-02-17: qty 1

## 2016-02-17 MED ORDER — ALTEPLASE 2 MG IJ SOLR
2.0000 mg | Freq: Once | INTRAMUSCULAR | Status: DC | PRN
  Filled 2016-02-17: qty 2

## 2016-02-17 MED ORDER — FENTANYL 25 MCG/HR TD PT72: 25.0000 ug | MEDICATED_PATCH | TRANSDERMAL | Status: DC

## 2016-02-17 MED ORDER — HYDROMORPHONE HCL 4 MG/ML IJ SOLN
2.0000 mg | INTRAMUSCULAR | Status: DC | PRN
  Administered 2016-02-17 (×2): 2 mg via INTRAVENOUS

## 2016-02-17 MED ORDER — SODIUM CHLORIDE 0.9 % IV SOLN
Freq: Once | INTRAVENOUS | Status: AC
  Administered 2016-02-17: 14:00:00 via INTRAVENOUS
  Filled 2016-02-17: qty 4

## 2016-02-17 MED ORDER — HEPARIN SOD (PORK) LOCK FLUSH 100 UNIT/ML IV SOLN
500.0000 [IU] | Freq: Once | INTRAVENOUS | Status: AC | PRN
  Administered 2016-02-17: 500 [IU]
  Filled 2016-02-17: qty 5

## 2016-02-17 MED ORDER — HEPARIN SOD (PORK) LOCK FLUSH 100 UNIT/ML IV SOLN
250.0000 [IU] | Freq: Once | INTRAVENOUS | Status: DC | PRN
  Filled 2016-02-17: qty 5

## 2016-02-17 MED ORDER — SODIUM CHLORIDE 0.9 % IJ SOLN
10.0000 mL | INTRAMUSCULAR | Status: DC | PRN
  Administered 2016-02-17: 10 mL
  Filled 2016-02-17: qty 10

## 2016-02-17 NOTE — Assessment & Plan Note (Signed)
He has stable skin toxicity compared to a week ago but overall is improving He has completed both chemotherapy and radiation treatment. I will continue aggressive supportive care and will continue see him on the weekly basis to provide symptom management.

## 2016-02-17 NOTE — Assessment & Plan Note (Signed)
He has grade 2 mucositis  His pain is well-controlled with current dose of fentanyl  I will see him next week for further assessment I encouraged him to take liquid morphine as needed for breakthrough pain. I refill his prescription today.

## 2016-02-17 NOTE — Assessment & Plan Note (Addendum)
He is struggling with oral intake but overall feeling better since we started him on IV fluid hydration therapy He is 100% dependent on feeding tube now I recommend reduce IV fluid support to every other day and encourage him to try oral intake

## 2016-02-17 NOTE — Assessment & Plan Note (Signed)
He continues to have grade 1-2 skin toxicity but overall is slowly healing. Continue conservative management with topical emollient cream.

## 2016-02-17 NOTE — Patient Instructions (Signed)

## 2016-02-17 NOTE — Progress Notes (Signed)
[facility] OFFICE PROGRESS NOTE  Patient Care Team: Hulan Fess, MD as PCP - General (Family Medicine) Melida Quitter, MD as Consulting Physician (Otolaryngology) Eppie Gibson, MD as Attending Physician (Radiation Oncology) Heath Lark, MD as Consulting Physician (Hematology and Oncology) Leota Sauers, RN as Oncology Nurse [place], RD as Dietitian (Nutrition)  SUMMARY OF ONCOLOGIC HISTORY:   Tonsil cancer ([place])   11/12/2015 Procedure Accession: [id number] FNA of left neck LN positive for squamous cell cancer   11/18/2015 Imaging Left greater than right cervical lymphadenopathy suspicious for nodal metastatic disease.2. Mild asymmetry of the left tonsil -- correlate with direct visualization to assess for primary neoplasm.   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   11/28/2015 PET scan 1. Left palatine tonsil primary with left worse than right cervical nodal metastasis. 2. No extracervical hypermetabolic metastasis.    12/12/2015 Procedure Peg and port-a-cath placement.   12/18/2015 - 01/29/2016 Chemotherapy He received weekly cetuximab   12/18/2015 - 02/03/2016 Radiation Therapy He received concurrent radiation   12/31/2015 Adverse Reaction Treatment #3 is placed on hold due to worsening mucositis   01/08/2016 Miscellaneous Cetuximab #3 resumed with 50% dose adjustment   01/14/2016 Adverse Reaction Treatment #4 is placed on hold due to worsening mucositis and failure to thrive    INTERVAL HISTORY: Please see below for problem oriented charting. He is is seen in the infusion room as part of his weekly supportive care visit. He continues to have significant throat pain, well controlled with fentanyl patch but still not able to tolerate oral intake despite using viscous lidocaine and Carafate and liquid morphine as needed He denies nausea or  constipation. He continues to have significant mucus production. His skin rashes improving He is able to tolerate 5-6 cans of nutritional supplement along with water flushes  REVIEW OF SYSTEMS:   Constitutional: Denies fevers, chills or abnormal weight loss Eyes: Denies blurriness of vision Respiratory: Denies cough, dyspnea or wheezes Cardiovascular: Denies palpitation, chest discomfort or lower extremity swelling Gastrointestinal:  Denies nausea, heartburn or change in bowel habits Lymphatics: Denies new lymphadenopathy or easy bruising Neurological:Denies numbness, tingling or new weaknesses Behavioral/Psych: Mood is stable, no new changes  All other systems were reviewed with the patient and are negative.  I have reviewed the past medical history, past surgical history, social history and family history with the patient and they are unchanged from previous note.  ALLERGIES:  has No Known Allergies.  MEDICATIONS:  Current Outpatient Prescriptions  Medication Sig Dispense Refill  . aspirin EC 81 MG tablet Take 81 mg by mouth daily.    . clindamycin (CLINDAGEL) 1 % gel Apply topically 2 (two) times daily. 30 g 0  . fentaNYL (DURAGESIC - DOSED MCG/HR) 25 MCG/HR patch Place 1 patch (25 mcg total) onto the skin every 3 (three) days. 5 patch 0  . hydrocortisone 1 % ointment Apply 1 application topically 2 (two) times daily. 30 g 0  . ibuprofen (ADVIL,MOTRIN) 200 MG tablet Take 200 mg by mouth as needed. Reported on 01/27/2016    . levothyroxine (SYNTHROID, LEVOTHROID) 75 MCG tablet Take 75 mcg by mouth daily before breakfast.    . lidocaine (XYLOCAINE) 2 % solution Mix 1 part 2%viscous lidocaine,1part H2O.Swish and/or swallow 11mL of this mixture,50min before meals and at bedtime, up to QID (Patient not taking: Reported on 01/20/2016) 100 mL 5  . LORazepam (ATIVAN)  0.5 MG tablet Take 1 tablet up to TID as needed for nausea 30 tablet 0  . morphine (ROXANOL) 20 MG/ML concentrated solution Place  0.5 mLs (10 mg total) into feeding tube every 2 (two) hours as needed for severe pain. (Patient not taking: Reported on 01/13/2016) 240 mL 0  . Nutritional Supplements (FEEDING SUPPLEMENT, OSMOLITE 1.5 CAL,) LIQD Begin one can Osmolite 1.5, or equivalent, QID with 30 cc free water before and 60 cc free water after. On day 3, increase to 1 1/2 cans BID and continue 2 cans BID as tolerated.On day 5, increase to 1 1/2 cans QID as tolerated.On day 7, increase to goal rate of 1 1/2 cans TID and two cans once for total of 6.5 cans daily.Flush tube or drink addition 32 oz water daily.Send TF and supplies. 1541 mL 4  . ondansetron (ZOFRAN) 8 MG tablet Take 1 tablet (8 mg total) by mouth every 8 (eight) hours as needed for nausea. 30 tablet 3  . promethazine (PHENERGAN) 25 MG tablet Take 1 tablet (25 mg total) by mouth every 6 (six) hours as needed for nausea. 30 tablet 3  . ranitidine (ZANTAC) 150 MG capsule Take 150 mg by mouth 2 (two) times daily. Reported on 01/27/2016    . sodium fluoride (FLUORISHIELD) 1.1 % GEL dental gel Instill one drop of gel per tooth space of fluoride tray. Place over teeth for 5 minutes. Remove. Spit out excess. Repeat nightly. 120 mL PRN  . sucralfate (CARAFATE) 1 g tablet Dissolve 1 tablet in 71mL H2O and swallow up to QID,PRN sore throat. (Patient not taking: Reported on 01/13/2016) 60 tablet 5   No current facility-administered medications for this visit.   Facility-Administered Medications Ordered in Other Visits  Medication Dose Route Frequency Provider Last Rate Last Dose  . 0.9 %  sodium chloride infusion   Intravenous Once Heath Lark, MD      . heparin lock flush 100 unit/mL  500 Units Intracatheter Once PRN Heath Lark, MD      . HYDROmorphone (DILAUDID) injection 2 mg  2 mg Intravenous PRN Heath Lark, MD   2 mg at 02/17/16 1419  . ondansetron (ZOFRAN) 8 mg in sodium chloride 0.9 % 50 mL IVPB   Intravenous Once Yarah Fuente, MD      . sodium chloride 0.9 % injection 10 mL  10  mL Intracatheter PRN Heath Lark, MD        PHYSICAL EXAMINATION: ECOG PERFORMANCE STATUS: 1 - Symptomatic but completely ambulatory  Filed Vitals:   02/17/16 1402  BP: 119/67  Pulse: 66  Temp: 98.2 F (36.8 C)  Resp: 17   There were no vitals filed for this visit.  GENERAL:alert, no distress and comfortable SKIN: He has significant grade 1-2 dermatitis on his skin from radiation treatment EYES: normal, Conjunctiva are pink and non-injected, sclera clear OROPHARYNX:no exudate, no erythema and lips, buccal mucosa, and tongue normal  Musculoskeletal:no cyanosis of digits and no clubbing  NEURO: alert & oriented x 3 with fluent speech, no focal motor/sensory deficits  LABORATORY DATA:  I have reviewed the data as listed    Component Value Date/Time   NA 139 01/28/2016 0929   NA 136 12/12/2015 1317   K 4.3 01/28/2016 0929   K 3.9 12/12/2015 1317   CL 103 12/12/2015 1317   CO2 30* 01/28/2016 0929   CO2 24 12/12/2015 1317   GLUCOSE 102 01/28/2016 0929   GLUCOSE 91 12/12/2015 1317   BUN 10.4 01/28/2016 0929  BUN 17 12/12/2015 1317   CREATININE 0.9 01/28/2016 0929   CREATININE 1.11 12/12/2015 1317   CALCIUM 9.6 01/28/2016 0929   CALCIUM 9.0 12/12/2015 1317   PROT 7.1 01/28/2016 0929   PROT 7.0 09/24/2009 1331   ALBUMIN 3.1* 01/28/2016 0929   ALBUMIN 4.1 09/24/2009 1331   AST 16 01/28/2016 0929   AST 18 09/24/2009 1331   ALT 17 01/28/2016 0929   ALT 40 09/24/2009 1331   ALKPHOS 77 01/28/2016 0929   ALKPHOS 123* 09/24/2009 1331   BILITOT 0.53 01/28/2016 0929   BILITOT 0.3 09/24/2009 1331   GFRNONAA >60 12/12/2015 1317   GFRAA >60 12/12/2015 1317    No results found for: SPEP, UPEP  Lab Results  Component Value Date   WBC 5.2 01/28/2016   NEUTROABS 4.4 01/28/2016   HGB 13.7 01/28/2016   HCT 41.6 01/28/2016   MCV 93.4 01/28/2016   PLT 191 01/28/2016      Chemistry      Component Value Date/Time   NA 139 01/28/2016 0929   NA 136 12/12/2015 1317   K 4.3  01/28/2016 0929   K 3.9 12/12/2015 1317   CL 103 12/12/2015 1317   CO2 30* 01/28/2016 0929   CO2 24 12/12/2015 1317   BUN 10.4 01/28/2016 0929   BUN 17 12/12/2015 1317   CREATININE 0.9 01/28/2016 0929   CREATININE 1.11 12/12/2015 1317      Component Value Date/Time   CALCIUM 9.6 01/28/2016 0929   CALCIUM 9.0 12/12/2015 1317   ALKPHOS 77 01/28/2016 0929   ALKPHOS 123* 09/24/2009 1331   AST 16 01/28/2016 0929   AST 18 09/24/2009 1331   ALT 17 01/28/2016 0929   ALT 40 09/24/2009 1331   BILITOT 0.53 01/28/2016 0929   BILITOT 0.3 09/24/2009 1331      ASSESSMENT & PLAN:   Tonsil cancer ([place]) He has stable skin toxicity compared to a week ago but overall is improving He has completed both chemotherapy and radiation treatment. I will continue aggressive supportive care and will continue see him on the weekly basis to provide symptom management.  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 Continue conservative management with topical emollient cream.  Protein-calorie malnutrition, mild ([place]) He is struggling with oral intake but overall feeling better since we started him on IV fluid hydration therapy He is 100% dependent on feeding tube now I recommend reduce IV fluid support to every other day and encourage him to try oral intake    Mucositis oral He has grade 2 mucositis  His pain is well-controlled with current dose of fentanyl  I will see him next week for further assessment I encouraged him to take liquid morphine as needed for breakthrough pain. I refill his prescription today.      All questions were answered. The patient knows to call the clinic with any problems, questions or concerns. No barriers to learning was detected. I spent 15 minutes counseling the patient face to face. The total time spent in the appointment was 20 minutes and more than 50% was on counseling and review of test results     [facility],  [place], MD 02/17/2016 2:21 PM

## 2016-02-18 ENCOUNTER — Ambulatory Visit (HOSPITAL_BASED_OUTPATIENT_CLINIC_OR_DEPARTMENT_OTHER): Payer: 59

## 2016-02-18 VITALS — BP 106/70 | HR 69 | Temp 98.7°F | Resp 18

## 2016-02-18 DIAGNOSIS — K1230 Oral mucositis (ulcerative), unspecified: Secondary | ICD-10-CM | POA: Diagnosis not present

## 2016-02-18 DIAGNOSIS — C090 Malignant neoplasm of tonsillar fossa: Secondary | ICD-10-CM

## 2016-02-18 MED ORDER — HYDROMORPHONE HCL 4 MG/ML IJ SOLN
2.0000 mg | INTRAMUSCULAR | Status: DC | PRN
  Administered 2016-02-18: 2 mg via INTRAVENOUS

## 2016-02-18 MED ORDER — SODIUM CHLORIDE 0.9 % IJ SOLN
10.0000 mL | INTRAMUSCULAR | Status: DC | PRN
  Administered 2016-02-18: 10 mL
  Filled 2016-02-18: qty 10

## 2016-02-18 MED ORDER — SODIUM CHLORIDE 0.9 % IV SOLN
Freq: Once | INTRAVENOUS | Status: AC
  Administered 2016-02-18: 14:00:00 via INTRAVENOUS

## 2016-02-18 MED ORDER — HYDROMORPHONE HCL 4 MG/ML IJ SOLN
INTRAMUSCULAR | Status: AC
  Filled 2016-02-18: qty 1

## 2016-02-18 MED ORDER — SODIUM CHLORIDE 0.9 % IV SOLN
Freq: Once | INTRAVENOUS | Status: AC
  Administered 2016-02-18: 14:00:00 via INTRAVENOUS
  Filled 2016-02-18: qty 4

## 2016-02-18 MED ORDER — HEPARIN SOD (PORK) LOCK FLUSH 100 UNIT/ML IV SOLN
500.0000 [IU] | Freq: Once | INTRAVENOUS | Status: AC | PRN
  Administered 2016-02-18: 500 [IU]
  Filled 2016-02-18: qty 5

## 2016-02-18 NOTE — Progress Notes (Signed)
  Oncology Nurse Navigator Documentation  Navigator Location: [facility]-Med Onc (02/17/16 1510) Navigator Encounter Type: Treatment (02/17/16 1510)           Patient Visit Type: MedOnc (02/17/16 1510)     Met with Gregory Jenkins in Infusion where he was receiving scheduled IVF.  He was accompanied by his wife.  He reported:  Feeling better since completing XRT 2 weeks ago though he is still experiencing pain with swallowing.  Maintaining nutritional supplement goal.  Significant fatigue.   Some resolution of thickened saliva though still experiencing copious amounts of secretions.   After this week, he will begin TIW IVF rather than daily. His skin is noticeably improved with minimal desquamation and erythema.   We discussed that he can expect throat soreness to resolve further during the upcoming weeks.  He voiced understanding that resolution of fatigue and return of taste sensations will be gradual over upcoming months.  They understand I can be contacted with needs/concerns.  Gayleen Orem, RN, BSN, [place] [facility] [place] [phone]                             Time Spent with Patient: 15 (02/17/16 1510)

## 2016-02-18 NOTE — Patient Instructions (Signed)

## 2016-02-19 ENCOUNTER — Ambulatory Visit
Admission: RE | Admit: 2016-02-19 | Discharge: 2016-02-19 | Disposition: A | Discharge status: Home / Self Care | Payer: 59 | Source: Ambulatory Visit | Attending: Radiation Oncology | Admitting: Radiation Oncology

## 2016-02-19 ENCOUNTER — Encounter: Payer: Self-pay | Admitting: Radiation Oncology

## 2016-02-19 ENCOUNTER — Ambulatory Visit (HOSPITAL_BASED_OUTPATIENT_CLINIC_OR_DEPARTMENT_OTHER): Payer: 59

## 2016-02-19 VITALS — BP 105/57 | HR 68 | Temp 97.9°F | Ht 71.5 in | Wt 186.7 lb

## 2016-02-19 VITALS — BP 106/62 | HR 72 | Temp 98.6°F | Resp 18

## 2016-02-19 DIAGNOSIS — K1230 Oral mucositis (ulcerative), unspecified: Secondary | ICD-10-CM | POA: Diagnosis not present

## 2016-02-19 DIAGNOSIS — C090 Malignant neoplasm of tonsillar fossa: Secondary | ICD-10-CM

## 2016-02-19 DIAGNOSIS — R634 Abnormal weight loss: Secondary | ICD-10-CM

## 2016-02-19 MED ORDER — HYDROMORPHONE HCL 4 MG/ML IJ SOLN
INTRAMUSCULAR | Status: AC
  Filled 2016-02-19: qty 1

## 2016-02-19 MED ORDER — HYDROMORPHONE HCL 4 MG/ML IJ SOLN
2.0000 mg | INTRAMUSCULAR | Status: DC | PRN
  Administered 2016-02-19: 2 mg via INTRAVENOUS

## 2016-02-19 MED ORDER — SODIUM CHLORIDE 0.9 % IJ SOLN
10.0000 mL | INTRAMUSCULAR | Status: DC | PRN
  Administered 2016-02-19: 10 mL
  Filled 2016-02-19: qty 10

## 2016-02-19 MED ORDER — SODIUM CHLORIDE 0.9 % IV SOLN
Freq: Once | INTRAVENOUS | Status: AC
  Administered 2016-02-19: 13:00:00 via INTRAVENOUS
  Filled 2016-02-19: qty 4

## 2016-02-19 MED ORDER — SODIUM CHLORIDE 0.9 % IV SOLN
Freq: Once | INTRAVENOUS | Status: AC
  Administered 2016-02-19: 13:00:00 via INTRAVENOUS

## 2016-02-19 MED ORDER — HEPARIN SOD (PORK) LOCK FLUSH 100 UNIT/ML IV SOLN
500.0000 [IU] | Freq: Once | INTRAVENOUS | Status: AC | PRN
  Administered 2016-02-19: 500 [IU]
  Filled 2016-02-19: qty 5

## 2016-02-19 NOTE — Patient Instructions (Signed)

## 2016-02-20 ENCOUNTER — Ambulatory Visit (HOSPITAL_BASED_OUTPATIENT_CLINIC_OR_DEPARTMENT_OTHER): Payer: 59

## 2016-02-20 ENCOUNTER — Ambulatory Visit: Payer: 59 | Admitting: Nutrition

## 2016-02-20 VITALS — BP 105/66 | HR 57 | Temp 98.3°F | Resp 18

## 2016-02-20 DIAGNOSIS — K1230 Oral mucositis (ulcerative), unspecified: Secondary | ICD-10-CM

## 2016-02-20 DIAGNOSIS — C090 Malignant neoplasm of tonsillar fossa: Secondary | ICD-10-CM

## 2016-02-20 MED ORDER — HYDROMORPHONE HCL 4 MG/ML IJ SOLN
2.0000 mg | INTRAMUSCULAR | Status: DC | PRN
  Administered 2016-02-20: 2 mg via INTRAVENOUS

## 2016-02-20 MED ORDER — HEPARIN SOD (PORK) LOCK FLUSH 100 UNIT/ML IV SOLN
500.0000 [IU] | Freq: Once | INTRAVENOUS | Status: AC | PRN
  Administered 2016-02-20: 500 [IU]
  Filled 2016-02-20: qty 5

## 2016-02-20 MED ORDER — HYDROMORPHONE HCL 4 MG/ML IJ SOLN
INTRAMUSCULAR | Status: AC
  Filled 2016-02-20: qty 1

## 2016-02-20 MED ORDER — SODIUM CHLORIDE 0.9 % IJ SOLN
10.0000 mL | INTRAMUSCULAR | Status: DC | PRN
  Administered 2016-02-20: 10 mL
  Filled 2016-02-20: qty 10

## 2016-02-20 MED ORDER — SODIUM CHLORIDE 0.9 % IV SOLN
Freq: Once | INTRAVENOUS | Status: AC
  Administered 2016-02-20: 13:00:00 via INTRAVENOUS

## 2016-02-20 MED ORDER — SODIUM CHLORIDE 0.9 % IV SOLN
Freq: Once | INTRAVENOUS | Status: AC
  Administered 2016-02-20: 13:00:00 via INTRAVENOUS
  Filled 2016-02-20: qty 4

## 2016-02-20 MED FILL — fentaNYL 25 MCG/HR PT72: 25 | 15 days supply | Qty: 5 | Fill #0

## 2016-02-20 NOTE — Progress Notes (Signed)
Nutrition follow-up completed with patient during IV fluids for tonsil cancer. Patient tolerating Osmolite 1.5 between 6 and 6.5 cans daily. He denies tolerance issues. Weight improved documented as 186.7 pounds April 26 improved from 181.3 pounds April 18. Patient states he was able to tolerate 8 ounces of water by mouth. He also ate Jell-O.  Nutrition diagnosis: Inadequate oral intake continues.  Intervention: I encouraged patient to continue tube feeding at goal rate of 6.5 cans daily. Educated patient to continue to try to take fluids and soft foods by mouth as tolerated. Encouraged increased fluid consumption. Teach back method used.  Monitoring, evaluation, goals: Patient will tolerate tube feeding and oral intake to meet minimum estimated nutrition needs to promote healing.  Next visit: Monday, May 8, during IV fluids.  **Disclaimer: This note was dictated with voice recognition software. Similar sounding words can inadvertently be transcribed and this note may contain transcription errors which may not have been corrected upon publication of note.**

## 2016-02-20 NOTE — Patient Instructions (Signed)

## 2016-02-21 ENCOUNTER — Telehealth: Payer: Self-pay | Admitting: *Deleted

## 2016-02-21 ENCOUNTER — Ambulatory Visit (HOSPITAL_BASED_OUTPATIENT_CLINIC_OR_DEPARTMENT_OTHER): Payer: 59

## 2016-02-21 VITALS — BP 116/67 | HR 78 | Temp 98.7°F | Resp 18

## 2016-02-21 DIAGNOSIS — C090 Malignant neoplasm of tonsillar fossa: Secondary | ICD-10-CM | POA: Diagnosis not present

## 2016-02-21 DIAGNOSIS — K1230 Oral mucositis (ulcerative), unspecified: Secondary | ICD-10-CM

## 2016-02-21 MED ORDER — SODIUM CHLORIDE 0.9 % IV SOLN
Freq: Once | INTRAVENOUS | Status: AC
  Administered 2016-02-21: 14:00:00 via INTRAVENOUS

## 2016-02-21 MED ORDER — HYDROMORPHONE HCL 4 MG/ML IJ SOLN
2.0000 mg | INTRAMUSCULAR | Status: DC | PRN
  Administered 2016-02-21: 2 mg via INTRAVENOUS

## 2016-02-21 MED ORDER — SODIUM CHLORIDE 0.9 % IJ SOLN
10.0000 mL | INTRAMUSCULAR | Status: DC | PRN
  Administered 2016-02-21: 10 mL
  Filled 2016-02-21: qty 10

## 2016-02-21 MED ORDER — SODIUM CHLORIDE 0.9 % IV SOLN
Freq: Once | INTRAVENOUS | Status: AC
  Administered 2016-02-21: 15:00:00 via INTRAVENOUS
  Filled 2016-02-21: qty 4

## 2016-02-21 MED ORDER — HEPARIN SOD (PORK) LOCK FLUSH 100 UNIT/ML IV SOLN
500.0000 [IU] | Freq: Once | INTRAVENOUS | Status: AC | PRN
  Administered 2016-02-21: 500 [IU]
  Filled 2016-02-21: qty 5

## 2016-02-21 MED ORDER — HYDROMORPHONE HCL 4 MG/ML IJ SOLN
INTRAMUSCULAR | Status: AC
  Filled 2016-02-21: qty 1

## 2016-02-21 NOTE — Progress Notes (Signed)
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 Loss of weight 783.21 R63.4 TSH  2. Carcinoma of tonsillar fossa (HCC) 146.1 C09.0 NM PET Image Restag (PS) Skull Base To Thigh     TSH     Narrative:  The patient returns today for routine follow-up.  Gregory Jenkins presents for follow up of radiation completed 02/03/16 to his Tonsil.  Pain issues, if any: He rates his pain a 5/10 in his tongue, and "where it attaches to his throat". He is using a  25 mcg Fentanyl patch, and Roxicet about 1-2 times a day. He just started the Roxicet in the past week. He is taking the pain medicine before he eats. He is using the lidocaine rinses and carafate also.   Using a feeding tube?: He is instilling Osmolite at 6-6 1/2 cans daily with 16 ounces of water through his PEG tube.   Swallowing issues, if any: He is able to swallow slightly better after he takes pain medicine. He is taking water, jello, and experimenting with certain other foods to test how he is able to swallow.  Smoking or chewing tobacco? No Using fluoride trays daily? He is using them daily. Last ENT visit was on: Not since diagnosis. Other notable issues, if any: He is receiving IV Fluids this week in the [place] for 6 days. Next week the plan is for him to have IV Fluids for 3 days only, and follow up with Dr. Alvy Jenkins.  His skin is improved. He is using neosporin and sonafine to his neck twice daily.                      ALLERGIES:  has No Known Allergies.  Meds: Current Outpatient Prescriptions  Medication Sig Dispense Refill  . aspirin EC 81 MG tablet Take 81 mg by mouth daily.    . clindamycin (CLINDAGEL) 1 % gel Apply topically 2 (two) times daily. 30 g 0    . fentaNYL (DURAGESIC - DOSED MCG/HR) 25 MCG/HR patch Place 1 patch (25 mcg total) onto the skin every 3 (three) days. 5 patch 0  . hydrocortisone 1 % ointment Apply 1 application topically 2 (two) times daily. 30 g 0  . ibuprofen (ADVIL,MOTRIN) 200 MG tablet Take 200 mg by mouth as needed. Reported on 01/27/2016    . levothyroxine (SYNTHROID, LEVOTHROID) 75 MCG tablet Take 75 mcg by mouth daily before breakfast.    . LORazepam (ATIVAN) 0.5 MG tablet Take 1 tablet up to TID as needed for nausea 30 tablet 0  . morphine (ROXANOL) 20 MG/ML concentrated solution Place 0.5 mLs (10 mg total) into feeding tube every 2 (two) hours as needed for severe pain. 240 mL 0  . Nutritional Supplements (FEEDING SUPPLEMENT, OSMOLITE 1.5 CAL,) LIQD Begin one can Osmolite 1.5, or equivalent, QID with 30 cc free water before and 60 cc free water after. On day 3, increase to 1 1/2 cans BID and continue 2 cans BID as tolerated.On day 5, increase to 1 1/2 cans QID as tolerated.On day 7, increase to goal rate of 1 1/2 cans TID and two cans once for total of 6.5 cans daily.Flush tube or drink  addition 32 oz water daily.Send TF and supplies. 1541 mL 4  . ondansetron (ZOFRAN) 8 MG tablet Take 1 tablet (8 mg total) by mouth every 8 (eight) hours as needed for nausea. 30 tablet 3  . promethazine (PHENERGAN) 25 MG tablet Take 1 tablet (25 mg total) by mouth every 6 (six) hours as needed for nausea. 30 tablet 3  . ranitidine (ZANTAC) 150 MG capsule Take 150 mg by mouth 2 (two) times daily. Reported on 01/27/2016    . sodium fluoride (FLUORISHIELD) 1.1 % GEL dental gel Instill one drop of gel per tooth space of fluoride tray. Place over teeth for 5 minutes. Remove. Spit out excess. Repeat nightly. 120 mL PRN  . lidocaine (XYLOCAINE) 2 % solution Mix 1 part 2%viscous lidocaine,1part H2O.Swish and/or swallow 70mL of this mixture,45min before meals and at bedtime, up to QID (Patient not taking: Reported on 01/20/2016) 100 mL 5  . sucralfate  (CARAFATE) 1 g tablet Dissolve 1 tablet in 45mL H2O and swallow up to QID,PRN sore throat. (Patient not taking: Reported on 01/13/2016) 60 tablet 5   No current facility-administered medications for this encounter.    Physical Findings: The patient is in no acute distress. Patient is alert and oriented. Wt Readings from Last 3 Encounters:  02/19/16 186 lb 11.2 oz (84.687 kg)  02/10/16 181 lb 4.8 oz (82.237 kg)  02/03/16 183 lb 12.8 oz (83.371 kg)    height is 5' 11.5" (1.816 m) and weight is 186 lb 11.2 oz (84.687 kg). His temperature is 97.9 F (36.6 C). His blood pressure is 105/57 and his pulse is 68. His oxygen saturation is 98%. .  General: Alert and oriented, in no acute distress HEENT: Head is normocephalic. Extraocular movements are intact. Oropharynx is notable for no visible tumor but resolving confluent mucositis. No thrush Neck: Neck is notable for resolving desquamation of skin Skin: Skin in treatment fields shows satisfactory healing so far, still healing, however Psychiatric: Judgment and insight are intact. Affect is appropriate.   Lab Findings: Lab Results  Component Value Date   WBC 5.2 01/28/2016   HGB 13.7 01/28/2016   HCT 41.6 01/28/2016   MCV 93.4 01/28/2016   PLT 191 01/28/2016     Radiographic Findings: No results found.  Impression/Plan:    1) Head and Neck Cancer Status: healing  2) Nutritional Status: making up for prior loss of weight PEG tube: using  3) Risk Factors: The patient has been educated about risk factors including alcohol and tobacco abuse; they understand that avoidance of alcohol and tobacco is important to prevent recurrences as well as other cancers  4) Swallowing: soft foods  5) Dental: Encouraged to continue regular followup with dentistry, and dental hygiene including fluoride rinses.   6) Thyroid function: check in 3 mo  7) Other: He'll see Dr Gregory Jenkins regularly for acute recovery  8) Follow-up in 3-3.5 months with  restaging TSH and PET. The patient was encouraged to call with any issues or questions before then.  . _____________________________________   Eppie Gibson, MD

## 2016-02-21 NOTE — Telephone Encounter (Signed)
CALLED PATIENT TO INFORM OF PET SCAN ON 05-26-16 AND FU APPT. WITH DR. Isidore Moos ON 05-27-16, LVM FOR A RETURN CALL

## 2016-02-21 NOTE — Patient Instructions (Signed)

## 2016-02-22 ENCOUNTER — Ambulatory Visit (HOSPITAL_BASED_OUTPATIENT_CLINIC_OR_DEPARTMENT_OTHER): Payer: 59

## 2016-02-22 DIAGNOSIS — K1230 Oral mucositis (ulcerative), unspecified: Secondary | ICD-10-CM | POA: Diagnosis not present

## 2016-02-22 DIAGNOSIS — C090 Malignant neoplasm of tonsillar fossa: Secondary | ICD-10-CM

## 2016-02-22 MED ORDER — HYDROMORPHONE HCL 4 MG/ML IJ SOLN
2.0000 mg | INTRAMUSCULAR | Status: DC | PRN
  Administered 2016-02-22: 2 mg via INTRAVENOUS

## 2016-02-22 MED ORDER — SODIUM CHLORIDE 0.9 % IV SOLN
Freq: Once | INTRAVENOUS | Status: AC
  Administered 2016-02-22: 08:00:00 via INTRAVENOUS

## 2016-02-22 MED ORDER — SODIUM CHLORIDE 0.9 % IJ SOLN
10.0000 mL | INTRAMUSCULAR | Status: DC | PRN
Start: 2016-02-22 — End: 2016-02-22
  Administered 2016-02-22: 10 mL
  Filled 2016-02-22: qty 10

## 2016-02-22 MED ORDER — HYDROMORPHONE HCL 4 MG/ML IJ SOLN
INTRAMUSCULAR | Status: AC
  Filled 2016-02-22: qty 1

## 2016-02-22 MED ORDER — SODIUM CHLORIDE 0.9 % IV SOLN
Freq: Once | INTRAVENOUS | Status: AC
  Administered 2016-02-22: 09:00:00 via INTRAVENOUS

## 2016-02-22 MED ORDER — HEPARIN SOD (PORK) LOCK FLUSH 100 UNIT/ML IV SOLN
500.0000 [IU] | Freq: Once | INTRAVENOUS | Status: AC | PRN
  Administered 2016-02-22: 500 [IU]
  Filled 2016-02-22: qty 5

## 2016-02-24 ENCOUNTER — Ambulatory Visit (HOSPITAL_BASED_OUTPATIENT_CLINIC_OR_DEPARTMENT_OTHER): Payer: 59

## 2016-02-24 VITALS — BP 113/63 | HR 81 | Temp 98.2°F | Resp 16

## 2016-02-24 DIAGNOSIS — K1230 Oral mucositis (ulcerative), unspecified: Secondary | ICD-10-CM | POA: Diagnosis not present

## 2016-02-24 DIAGNOSIS — C090 Malignant neoplasm of tonsillar fossa: Secondary | ICD-10-CM

## 2016-02-24 MED ORDER — SODIUM CHLORIDE 0.9 % IJ SOLN
10.0000 mL | INTRAMUSCULAR | Status: DC | PRN
  Administered 2016-02-24: 10 mL
  Filled 2016-02-24: qty 10

## 2016-02-24 MED ORDER — HEPARIN SOD (PORK) LOCK FLUSH 100 UNIT/ML IV SOLN
250.0000 [IU] | Freq: Once | INTRAVENOUS | Status: AC | PRN
  Administered 2016-02-24: 250 [IU]
  Filled 2016-02-24: qty 5

## 2016-02-24 MED ORDER — SODIUM CHLORIDE 0.9 % IV SOLN
Freq: Once | INTRAVENOUS | Status: AC
  Administered 2016-02-24: 14:00:00 via INTRAVENOUS

## 2016-02-24 MED ORDER — SODIUM CHLORIDE 0.9 % IV SOLN
Freq: Once | INTRAVENOUS | Status: AC
  Administered 2016-02-24: 15:00:00 via INTRAVENOUS
  Filled 2016-02-24: qty 4

## 2016-02-24 MED ORDER — HYDROMORPHONE HCL 4 MG/ML IJ SOLN
2.0000 mg | INTRAMUSCULAR | Status: DC | PRN
  Administered 2016-02-24: 2 mg via INTRAVENOUS

## 2016-02-24 MED ORDER — HYDROMORPHONE HCL 4 MG/ML IJ SOLN
INTRAMUSCULAR | Status: AC
  Filled 2016-02-24: qty 1

## 2016-02-24 NOTE — Patient Instructions (Signed)

## 2016-02-26 ENCOUNTER — Ambulatory Visit (HOSPITAL_BASED_OUTPATIENT_CLINIC_OR_DEPARTMENT_OTHER): Payer: 59

## 2016-02-26 ENCOUNTER — Telehealth: Payer: Self-pay | Admitting: Hematology and Oncology

## 2016-02-26 ENCOUNTER — Ambulatory Visit (HOSPITAL_BASED_OUTPATIENT_CLINIC_OR_DEPARTMENT_OTHER): Payer: 59 | Admitting: Hematology and Oncology

## 2016-02-26 VITALS — BP 104/73 | HR 66 | Temp 98.0°F | Resp 17 | Wt 179.4 lb

## 2016-02-26 VITALS — BP 105/51 | HR 65 | Temp 97.8°F | Resp 18

## 2016-02-26 DIAGNOSIS — C090 Malignant neoplasm of tonsillar fossa: Secondary | ICD-10-CM

## 2016-02-26 DIAGNOSIS — L589 Radiodermatitis, unspecified: Secondary | ICD-10-CM

## 2016-02-26 DIAGNOSIS — E441 Mild protein-calorie malnutrition: Secondary | ICD-10-CM

## 2016-02-26 DIAGNOSIS — K1230 Oral mucositis (ulcerative), unspecified: Secondary | ICD-10-CM | POA: Diagnosis not present

## 2016-02-26 DIAGNOSIS — C099 Malignant neoplasm of tonsil, unspecified: Secondary | ICD-10-CM | POA: Diagnosis not present

## 2016-02-26 DIAGNOSIS — K9281 Gastrointestinal mucositis (ulcerative): Secondary | ICD-10-CM

## 2016-02-26 DIAGNOSIS — K9429 Other complications of gastrostomy: Secondary | ICD-10-CM

## 2016-02-26 MED ORDER — SODIUM CHLORIDE 0.9 % IV SOLN
Freq: Once | INTRAVENOUS | Status: AC
  Administered 2016-02-26: 13:00:00 via INTRAVENOUS

## 2016-02-26 MED ORDER — HEPARIN SOD (PORK) LOCK FLUSH 100 UNIT/ML IV SOLN
500.0000 [IU] | Freq: Once | INTRAVENOUS | Status: AC | PRN
  Administered 2016-02-26: 500 [IU]
  Filled 2016-02-26: qty 5

## 2016-02-26 MED ORDER — HYDROMORPHONE HCL 4 MG/ML IJ SOLN
2.0000 mg | INTRAMUSCULAR | Status: DC | PRN
  Administered 2016-02-26: 2 mg via INTRAVENOUS

## 2016-02-26 MED ORDER — ONDANSETRON HCL 40 MG/20ML IJ SOLN
Freq: Once | INTRAMUSCULAR | Status: AC
  Administered 2016-02-26: 13:00:00 via INTRAVENOUS
  Filled 2016-02-26: qty 4

## 2016-02-26 MED ORDER — SODIUM CHLORIDE 0.9 % IJ SOLN
10.0000 mL | INTRAMUSCULAR | Status: DC | PRN
  Administered 2016-02-26: 10 mL
  Filled 2016-02-26: qty 10

## 2016-02-26 MED ORDER — HYDROMORPHONE HCL 4 MG/ML IJ SOLN
INTRAMUSCULAR | Status: AC
  Filled 2016-02-26: qty 1

## 2016-02-26 NOTE — Assessment & Plan Note (Signed)
The patient is fairly reluctant to take oral morphine sulfate. He is attempting to wean himself off fentanyl patch as he felt he does not have pain on a consistent basis. I encouraged him to take pain medicine before he eats.

## 2016-02-26 NOTE — Progress Notes (Signed)
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 Mild asymmetry of the left tonsil -- correlate with direct visualization to assess for primary neoplasm.   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   11/28/2015 PET scan 1. Left palatine tonsil primary with left worse than right cervical nodal metastasis. 2. No extracervical hypermetabolic metastasis.    12/12/2015 Procedure Peg and port-a-cath placement.   12/18/2015 - 01/29/2016 Chemotherapy He received weekly cetuximab   12/18/2015 - 02/03/2016 Radiation Therapy He received concurrent radiation   12/31/2015 Adverse Reaction Treatment #3 is placed on hold due to worsening mucositis   01/08/2016 Miscellaneous Cetuximab #3 resumed with 50% dose adjustment   01/14/2016 Adverse Reaction Treatment #4 is placed on hold due to worsening mucositis and failure to thrive    INTERVAL HISTORY: Please see below for problem oriented charting. He returns for his weekly visit. He is able to tolerate reduce IV fluid support. He is attempting to wean himself off pain medicine and is reluctant to take liquid morphine sulfate. However, the oral mucositis is preventing him from swallowing food as it is causing significant pain when he tried to even drink  liquids He is frustrated with slow progress made. The mucous production from his oropharynx is slowly reduced. He is able to tolerate nutritional supplement well through the feeding tube.  REVIEW OF SYSTEMS:   Constitutional: Denies fevers, chills or abnormal weight loss Eyes: Denies blurriness of vision Respiratory: Denies cough, dyspnea or wheezes Cardiovascular: Denies palpitation, chest discomfort or lower extremity swelling Gastrointestinal:  Denies nausea, heartburn or change in bowel habits Lymphatics: Denies new lymphadenopathy or easy bruising Neurological:Denies numbness, tingling or new weaknesses Behavioral/Psych: Mood is stable, no new changes  All other systems were reviewed with the patient and are negative.  I have reviewed the past medical history, past surgical history, social history and family history with the patient and they are unchanged from previous note.  ALLERGIES:  has No Known Allergies.  MEDICATIONS:  Current Outpatient Prescriptions  Medication Sig Dispense Refill  . aspirin EC 81 MG tablet Take 81 mg by mouth daily.    . clindamycin (CLINDAGEL) 1 % gel Apply topically 2 (two) times daily. 30 g 0  . fentaNYL (DURAGESIC - DOSED MCG/HR) 25 MCG/HR patch Place 1 patch (25 mcg total) onto the skin every 3 (three) days. 5 patch 0  . hydrocortisone 1 % ointment Apply 1 application topically 2 (two) times daily. 30 g 0  . ibuprofen (ADVIL,MOTRIN) 200 MG tablet Take 200 mg by mouth as needed. Reported on 01/27/2016    . levothyroxine (SYNTHROID, LEVOTHROID) 75 MCG tablet Take 75 mcg by mouth daily before breakfast.    . lidocaine (XYLOCAINE) 2 % solution Mix 1 part 2%viscous lidocaine,1part H2O.Swish and/or swallow 62mL of this mixture,18min before meals and at bedtime, up to QID (Patient not taking: Reported on 01/20/2016) 100  mL 5  . LORazepam (ATIVAN) 0.5 MG tablet Take 1 tablet up to TID as needed for nausea 30 tablet 0  . morphine (ROXANOL) 20 MG/ML concentrated  solution Place 0.5 mLs (10 mg total) into feeding tube every 2 (two) hours as needed for severe pain. 240 mL 0  . Nutritional Supplements (FEEDING SUPPLEMENT, OSMOLITE 1.5 CAL,) LIQD Begin one can Osmolite 1.5, or equivalent, QID with 30 cc free water before and 60 cc free water after. On day 3, increase to 1 1/2 cans BID and continue 2 cans BID as tolerated.On day 5, increase to 1 1/2 cans QID as tolerated.On day 7, increase to goal rate of 1 1/2 cans TID and two cans once for total of 6.5 cans daily.Flush tube or drink addition 32 oz water daily.Send TF and supplies. 1541 mL 4  . ondansetron (ZOFRAN) 8 MG tablet Take 1 tablet (8 mg total) by mouth every 8 (eight) hours as needed for nausea. 30 tablet 3  . promethazine (PHENERGAN) 25 MG tablet Take 1 tablet (25 mg total) by mouth every 6 (six) hours as needed for nausea. 30 tablet 3  . ranitidine (ZANTAC) 150 MG capsule Take 150 mg by mouth 2 (two) times daily. Reported on 01/27/2016    . sodium fluoride (FLUORISHIELD) 1.1 % GEL dental gel Instill one drop of gel per tooth space of fluoride tray. Place over teeth for 5 minutes. Remove. Spit out excess. Repeat nightly. 120 mL PRN  . sucralfate (CARAFATE) 1 g tablet Dissolve 1 tablet in 32mL H2O and swallow up to QID,PRN sore throat. (Patient not taking: Reported on 01/13/2016) 60 tablet 5   No current facility-administered medications for this visit.   Facility-Administered Medications Ordered in Other Visits  Medication Dose Route Frequency Provider Last Rate Last Dose  . heparin lock flush 100 unit/mL  500 Units Intracatheter Once PRN Heath Lark, MD      . HYDROmorphone (DILAUDID) injection 2 mg  2 mg Intravenous PRN Heath Lark, MD   2 mg at 02/26/16 1247  . sodium chloride 0.9 % injection 10 mL  10 mL Intracatheter PRN Heath Lark, MD        PHYSICAL EXAMINATION: ECOG PERFORMANCE STATUS: 1 - Symptomatic but completely ambulatory  Filed Vitals:   02/26/16 1206  BP: 104/73  Pulse: 66  Temp: 98  F (36.7 C)  Resp: 17   Filed Weights   02/26/16 1206  Weight: 179 lb 6.4 oz (81.375 kg)    GENERAL:alert, no distress and comfortable SKIN: The skin around his neck is healing well. EYES: normal, Conjunctiva are pink and non-injected, sclera clear OROPHARYNX:no exudate, no erythema and lips, buccal mucosa, and tongue normal . Persistent mild oropharyngeal mucositis is noted NECK: supple, thyroid normal size, non-tender, without nodularity Musculoskeletal:no cyanosis of digits and no clubbing  NEURO: alert & oriented x 3 with fluent speech, no focal motor/sensory deficits  LABORATORY DATA:  I have reviewed the data as listed    Component Value Date/Time   NA 139 01/28/2016 0929   NA 136 12/12/2015 1317   K 4.3 01/28/2016 0929   K 3.9 12/12/2015 1317   CL 103 12/12/2015 1317   CO2 30* 01/28/2016 0929   CO2 24 12/12/2015 1317   GLUCOSE 102 01/28/2016 0929   GLUCOSE 91 12/12/2015 1317   BUN 10.4 01/28/2016 0929   BUN 17 12/12/2015 1317   CREATININE 0.9 01/28/2016 0929   CREATININE 1.11 12/12/2015 1317   CALCIUM 9.6 01/28/2016 0929  CALCIUM 9.0 12/12/2015 1317   PROT 7.1 01/28/2016 0929   PROT 7.0 09/24/2009 1331   ALBUMIN 3.1* 01/28/2016 0929   ALBUMIN 4.1 09/24/2009 1331   AST 16 01/28/2016 0929   AST 18 09/24/2009 1331   ALT 17 01/28/2016 0929   ALT 40 09/24/2009 1331   ALKPHOS 77 01/28/2016 0929   ALKPHOS 123* 09/24/2009 1331   BILITOT 0.53 01/28/2016 0929   BILITOT 0.3 09/24/2009 1331   GFRNONAA >60 12/12/2015 1317   GFRAA >60 12/12/2015 1317    No results found for: SPEP, UPEP  Lab Results  Component Value Date   WBC 5.2 01/28/2016   NEUTROABS 4.4 01/28/2016   HGB 13.7 01/28/2016   HCT 41.6 01/28/2016   MCV 93.4 01/28/2016   PLT 191 01/28/2016      Chemistry      Component Value Date/Time   NA 139 01/28/2016 0929   NA 136 12/12/2015 1317   K 4.3 01/28/2016 0929   K 3.9 12/12/2015 1317   CL 103 12/12/2015 1317   CO2 30* 01/28/2016 0929   CO2  24 12/12/2015 1317   BUN 10.4 01/28/2016 0929   BUN 17 12/12/2015 1317   CREATININE 0.9 01/28/2016 0929   CREATININE 1.11 12/12/2015 1317      Component Value Date/Time   CALCIUM 9.6 01/28/2016 0929   CALCIUM 9.0 12/12/2015 1317   ALKPHOS 77 01/28/2016 0929   ALKPHOS 123* 09/24/2009 1331   AST 16 01/28/2016 0929   AST 18 09/24/2009 1331   ALT 17 01/28/2016 0929   ALT 40 09/24/2009 1331   BILITOT 0.53 01/28/2016 0929   BILITOT 0.3 09/24/2009 1331      ASSESSMENT & PLAN:  Tonsil cancer ([place]) He is improving I will continue aggressive supportive care and will continue see him on the weekly basis to provide symptom management.    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 He is attempting to wean himself off fentanyl patch as he felt he does not have pain on a consistent basis. I encouraged him to take pain medicine before he eats.  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 Continue conservative management with topical emollient cream.     No orders of the defined types were placed in this encounter.   All questions were answered. The patient knows to call the clinic with any problems, questions or concerns. No barriers to learning was detected. I spent 15 minutes counseling the patient face to face. The total time spent in the appointment was 20 minutes and more than 50% was on counseling and review of test results     [facility], Cardin Nitschke, MD 02/26/2016 1:46 PM

## 2016-02-26 NOTE — Assessment & Plan Note (Signed)
He is struggling with oral intake but overall feeling better He is 100% dependent on feeding tube now I recommend reduce IV fluid support to every other day and encourage him to try oral intake

## 2016-02-26 NOTE — Assessment & Plan Note (Signed)
He continues to have grade 1 skin toxicity but overall is slowly healing. Continue conservative management with topical emollient cream.

## 2016-02-26 NOTE — Telephone Encounter (Signed)
per pof to sch pt appt-pt to get updated copy of avs b4 leaving trmt

## 2016-02-26 NOTE — Assessment & Plan Note (Signed)
He is improving I will continue aggressive supportive care and will continue see him on the weekly basis to provide symptom management.

## 2016-02-26 NOTE — Patient Instructions (Signed)

## 2016-02-28 ENCOUNTER — Ambulatory Visit (HOSPITAL_BASED_OUTPATIENT_CLINIC_OR_DEPARTMENT_OTHER): Payer: 59

## 2016-02-28 VITALS — BP 118/73 | HR 76 | Temp 98.8°F | Resp 16

## 2016-02-28 DIAGNOSIS — K1230 Oral mucositis (ulcerative), unspecified: Secondary | ICD-10-CM | POA: Diagnosis not present

## 2016-02-28 DIAGNOSIS — C090 Malignant neoplasm of tonsillar fossa: Secondary | ICD-10-CM

## 2016-02-28 MED ORDER — HYDROMORPHONE HCL 4 MG/ML IJ SOLN
2.0000 mg | INTRAMUSCULAR | Status: DC | PRN
  Administered 2016-02-28: 2 mg via INTRAVENOUS

## 2016-02-28 MED ORDER — HEPARIN SOD (PORK) LOCK FLUSH 100 UNIT/ML IV SOLN
500.0000 [IU] | Freq: Once | INTRAVENOUS | Status: AC | PRN
  Administered 2016-02-28: 500 [IU]
  Filled 2016-02-28: qty 5

## 2016-02-28 MED ORDER — HYDROMORPHONE HCL 4 MG/ML IJ SOLN
INTRAMUSCULAR | Status: AC
  Filled 2016-02-28: qty 1

## 2016-02-28 MED ORDER — SODIUM CHLORIDE 0.9 % IJ SOLN
10.0000 mL | INTRAMUSCULAR | Status: DC | PRN
  Administered 2016-02-28: 10 mL
  Filled 2016-02-28: qty 10

## 2016-02-28 MED ORDER — ONDANSETRON HCL 40 MG/20ML IJ SOLN
Freq: Once | INTRAMUSCULAR | Status: AC
  Administered 2016-02-28: 15:00:00 via INTRAVENOUS
  Filled 2016-02-28: qty 4

## 2016-02-28 MED ORDER — SODIUM CHLORIDE 0.9 % IV SOLN
Freq: Once | INTRAVENOUS | Status: AC
  Administered 2016-02-28: 15:00:00 via INTRAVENOUS

## 2016-02-28 NOTE — Patient Instructions (Signed)

## 2016-03-02 ENCOUNTER — Telehealth (HOSPITAL_COMMUNITY): Payer: Self-pay

## 2016-03-02 ENCOUNTER — Ambulatory Visit: Payer: 59 | Admitting: Nutrition

## 2016-03-02 ENCOUNTER — Ambulatory Visit (HOSPITAL_BASED_OUTPATIENT_CLINIC_OR_DEPARTMENT_OTHER): Payer: 59

## 2016-03-02 ENCOUNTER — Telehealth (HOSPITAL_COMMUNITY): Payer: Self-pay | Admitting: Dentistry

## 2016-03-02 ENCOUNTER — Encounter: Payer: Self-pay | Admitting: Nutrition

## 2016-03-02 VITALS — BP 129/73 | HR 74 | Temp 98.1°F | Resp 18

## 2016-03-02 DIAGNOSIS — K1230 Oral mucositis (ulcerative), unspecified: Secondary | ICD-10-CM

## 2016-03-02 DIAGNOSIS — C090 Malignant neoplasm of tonsillar fossa: Secondary | ICD-10-CM | POA: Diagnosis not present

## 2016-03-02 MED ORDER — HEPARIN SOD (PORK) LOCK FLUSH 100 UNIT/ML IV SOLN
500.0000 [IU] | Freq: Once | INTRAVENOUS | Status: AC | PRN
  Administered 2016-03-02: 500 [IU]
  Filled 2016-03-02: qty 5

## 2016-03-02 MED ORDER — SODIUM CHLORIDE 0.9 % IJ SOLN
10.0000 mL | INTRAMUSCULAR | Status: DC | PRN
  Administered 2016-03-02: 10 mL
  Filled 2016-03-02: qty 10

## 2016-03-02 MED ORDER — SODIUM CHLORIDE 0.9 % IV SOLN
Freq: Once | INTRAVENOUS | Status: AC
  Administered 2016-03-02: 08:00:00 via INTRAVENOUS

## 2016-03-02 MED ORDER — ONDANSETRON HCL 40 MG/20ML IJ SOLN
Freq: Once | INTRAMUSCULAR | Status: AC
  Administered 2016-03-02: 09:00:00 via INTRAVENOUS
  Filled 2016-03-02: qty 4

## 2016-03-02 NOTE — Telephone Encounter (Signed)
03/02/2016  Patient:            Gregory Jenkins Date of Birth:  Aug 30, 1951 MRN:                [id number]   I called and spoke with the patient regarding dental appointment for S/P XRT scheduled on 03/04/16 with Dr. Enrique Sack. Patient not feeling well and cancelled the appointment. Patient will call back to reschedule. L. Ingram/Dr. Enrique Sack

## 2016-03-02 NOTE — Patient Instructions (Signed)

## 2016-03-02 NOTE — Telephone Encounter (Signed)
03/02/16    Called and spoke w/pt. regarding S/P XRT CK appt. on 03/04/16 w/Dr.Kulinski.  Patient will cancell at this time due to not feeling well and will reschedule at a later date.  LRI

## 2016-03-02 NOTE — Progress Notes (Signed)
Nutrition follow-up completed with patient and wife status post treatment for tonsil cancer. Patient is utilizing Osmolite 1.5-6 cans daily via PEG. Denies issues with tube feeding tolerance. States he is able to eat some very soft foods and can drink plenty of water by mouth. Patient continues to have mouth sores which are healing slowly. Weight was documented as 179 pounds May 3.  Nutrition diagnosis: Inadequate oral intake continues.  Intervention: Recommended patient continue 6 cans Osmolite 1.5 daily, via feeding tube. Educated patient and wife on soft moist foods the patient can try to improve oral intake. Encouraged small frequent meals throughout the day. Encouraged patient to continue baking soda and salt water rinses to improve mouth sores. Questions were answered.  Teach back method used.  Monitoring, evaluation, goals: Patient will tolerate tube feeding plus oral intake to promote weight maintenance and promote healing.  Next visit: To be scheduled in approximately one month.  Patient will contact me by phone if needed before that.  **Disclaimer: This note was dictated with voice recognition software. Similar sounding words can inadvertently be transcribed and this note may contain transcription errors which may not have been corrected upon publication of note.**

## 2016-03-04 ENCOUNTER — Ambulatory Visit (HOSPITAL_BASED_OUTPATIENT_CLINIC_OR_DEPARTMENT_OTHER): Payer: 59

## 2016-03-04 ENCOUNTER — Inpatient Hospital Stay: Admission: RE | Admit: 2016-03-04 | Payer: Self-pay | Source: Ambulatory Visit | Admitting: Radiation Oncology

## 2016-03-04 ENCOUNTER — Ambulatory Visit (HOSPITAL_COMMUNITY): Payer: Self-pay | Admitting: Dentistry

## 2016-03-04 ENCOUNTER — Telehealth: Payer: Self-pay | Admitting: Hematology and Oncology

## 2016-03-04 ENCOUNTER — Ambulatory Visit: Payer: Self-pay | Admitting: Radiation Oncology

## 2016-03-04 ENCOUNTER — Encounter: Payer: Self-pay | Admitting: Hematology and Oncology

## 2016-03-04 ENCOUNTER — Ambulatory Visit (HOSPITAL_BASED_OUTPATIENT_CLINIC_OR_DEPARTMENT_OTHER): Payer: 59 | Admitting: Hematology and Oncology

## 2016-03-04 VITALS — BP 112/75 | HR 77 | Temp 97.8°F | Resp 18

## 2016-03-04 VITALS — BP 117/72 | HR 71 | Temp 98.0°F | Resp 18 | Wt 178.4 lb

## 2016-03-04 DIAGNOSIS — J392 Other diseases of pharynx: Secondary | ICD-10-CM

## 2016-03-04 DIAGNOSIS — C090 Malignant neoplasm of tonsillar fossa: Secondary | ICD-10-CM | POA: Diagnosis not present

## 2016-03-04 DIAGNOSIS — E441 Mild protein-calorie malnutrition: Secondary | ICD-10-CM | POA: Diagnosis not present

## 2016-03-04 DIAGNOSIS — K1230 Oral mucositis (ulcerative), unspecified: Secondary | ICD-10-CM | POA: Diagnosis not present

## 2016-03-04 DIAGNOSIS — C099 Malignant neoplasm of tonsil, unspecified: Secondary | ICD-10-CM | POA: Diagnosis not present

## 2016-03-04 DIAGNOSIS — R198 Other specified symptoms and signs involving the digestive system and abdomen: Secondary | ICD-10-CM

## 2016-03-04 MED ORDER — ONDANSETRON HCL 40 MG/20ML IJ SOLN
Freq: Once | INTRAMUSCULAR | Status: AC
  Administered 2016-03-04: 11:00:00 via INTRAVENOUS
  Filled 2016-03-04: qty 4

## 2016-03-04 MED ORDER — SODIUM CHLORIDE 0.9 % IJ SOLN
10.0000 mL | INTRAMUSCULAR | Status: DC | PRN
  Administered 2016-03-04: 10 mL
  Filled 2016-03-04: qty 10

## 2016-03-04 MED ORDER — SODIUM CHLORIDE 0.9 % IV SOLN
Freq: Once | INTRAVENOUS | Status: AC
  Administered 2016-03-04: 11:00:00 via INTRAVENOUS

## 2016-03-04 MED ORDER — HEPARIN SOD (PORK) LOCK FLUSH 100 UNIT/ML IV SOLN
500.0000 [IU] | Freq: Once | INTRAVENOUS | Status: AC | PRN
  Administered 2016-03-04: 500 [IU]
  Filled 2016-03-04: qty 5

## 2016-03-04 NOTE — Assessment & Plan Note (Signed)
He is improving I will continue aggressive supportive care and will stop IV fluids this week I plan to see him back next month and schedule [facility] clinic at the same time.

## 2016-03-04 NOTE — Assessment & Plan Note (Signed)
He had frequent gagging from mucous production but overall is improving This is gradually becoming less frequent He denies dysphagia or aspiration with recent oral intake.

## 2016-03-04 NOTE — Progress Notes (Signed)
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 Mild asymmetry of the left tonsil -- correlate with direct visualization to assess for primary neoplasm.   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   11/28/2015 PET scan 1. Left palatine tonsil primary with left worse than right cervical nodal metastasis. 2. No extracervical hypermetabolic metastasis.    12/12/2015 Procedure Peg and port-a-cath placement.   12/18/2015 - 01/29/2016 Chemotherapy He received weekly cetuximab   12/18/2015 - 02/03/2016 Radiation Therapy He received concurrent radiation   12/31/2015 Adverse Reaction Treatment #3 is placed on hold due to worsening mucositis   01/08/2016 Miscellaneous Cetuximab #3 resumed with 50% dose adjustment   01/14/2016 Adverse Reaction Treatment #4 is placed on hold due to worsening mucositis and failure to thrive    INTERVAL HISTORY: Please see below for problem oriented charting. I saw him in infusion area. He continues to improve with less pain and less mucus gagging. He is able to tolerate some soup He had one episode of severe abdominal yesterday but resolved spontaneously. Denies constipation or nausea. He barely use any pain medicine recently.  REVIEW OF SYSTEMS:    Constitutional: Denies fevers, chills or abnormal weight loss Eyes: Denies blurriness of vision Respiratory: Denies cough, dyspnea or wheezes Cardiovascular: Denies palpitation, chest discomfort or lower extremity swelling Skin: Denies abnormal skin rashes Lymphatics: Denies new lymphadenopathy or easy bruising Neurological:Denies numbness, tingling or new weaknesses Behavioral/Psych: Mood is stable, no new changes  All other systems were reviewed with the patient and are negative.  I have reviewed the past medical history, past surgical history, social history and family history with the patient and they are unchanged from previous note.  ALLERGIES:  has No Known Allergies.  MEDICATIONS:  Current Outpatient Prescriptions  Medication Sig Dispense Refill  . aspirin EC 81 MG tablet Take 81 mg by mouth daily.    . clindamycin (CLINDAGEL) 1 % gel Apply topically 2 (two) times daily. 30 g 0  . hydrocortisone 1 % ointment Apply 1 application topically 2 (two) times daily. 30 g 0  . ibuprofen (ADVIL,MOTRIN) 200 MG tablet Take 200 mg by mouth as needed. Reported on 01/27/2016    . levothyroxine (SYNTHROID, LEVOTHROID) 75 MCG tablet Take 75 mcg by mouth daily before breakfast.    . lidocaine (XYLOCAINE) 2 % solution Mix 1 part 2%viscous lidocaine,1part H2O.Swish and/or swallow 9mL of this mixture,63min before meals and at bedtime, up to QID (Patient not taking: Reported on 01/20/2016) 100 mL 5  . LORazepam (ATIVAN) 0.5 MG tablet Take 1 tablet up to TID as needed for nausea 30 tablet 0  . morphine (ROXANOL) 20 MG/ML concentrated solution Place 0.5 mLs (10 mg total) into feeding tube every 2 (two) hours as needed for severe pain. 240 mL 0  . Nutritional Supplements (FEEDING SUPPLEMENT, OSMOLITE 1.5 CAL,) LIQD Begin one can Osmolite 1.5, or equivalent,  QID with 30 cc free water before and 60 cc free water after. On day 3, increase to 1 1/2 cans BID and continue 2 cans BID as tolerated.On day 5, increase  to 1 1/2 cans QID as tolerated.On day 7, increase to goal rate of 1 1/2 cans TID and two cans once for total of 6.5 cans daily.Flush tube or drink addition 32 oz water daily.Send TF and supplies. 1541 mL 4  . ondansetron (ZOFRAN) 8 MG tablet Take 1 tablet (8 mg total) by mouth every 8 (eight) hours as needed for nausea. 30 tablet 3  . promethazine (PHENERGAN) 25 MG tablet Take 1 tablet (25 mg total) by mouth every 6 (six) hours as needed for nausea. 30 tablet 3  . ranitidine (ZANTAC) 150 MG capsule Take 150 mg by mouth 2 (two) times daily. Reported on 01/27/2016    . sodium fluoride (FLUORISHIELD) 1.1 % GEL dental gel Instill one drop of gel per tooth space of fluoride tray. Place over teeth for 5 minutes. Remove. Spit out excess. Repeat nightly. 120 mL PRN  . sucralfate (CARAFATE) 1 g tablet Dissolve 1 tablet in 78mL H2O and swallow up to QID,PRN sore throat. (Patient not taking: Reported on 01/13/2016) 60 tablet 5   No current facility-administered medications for this visit.   Facility-Administered Medications Ordered in Other Visits  Medication Dose Route Frequency Provider Last Rate Last Dose  . sodium chloride 0.9 % injection 10 mL  10 mL Intracatheter PRN Heath Lark, MD   10 mL at 03/04/16 1257    PHYSICAL EXAMINATION: ECOG PERFORMANCE STATUS: 1 - Symptomatic but completely ambulatory  Filed Vitals:   03/04/16 1106  BP: 117/72  Pulse: 71  Temp: 98 F (36.7 C)  Resp: 18   Filed Weights   03/04/16 1106  Weight: 178 lb 6.4 oz (80.922 kg)    GENERAL:alert, no distress and comfortable SKIN: skin color, texture, turgor are normal, no rashes or significant lesions. Prior radiation-induced skin injury has healed EYES: normal, Conjunctiva are pink and non-injected, sclera clear ABDOMEN:abdomen soft, feeding tube site looks okay Musculoskeletal:no cyanosis of digits and no clubbing  NEURO: alert & oriented x 3 with fluent speech, no focal motor/sensory deficits  LABORATORY DATA:  I  have reviewed the data as listed    Component Value Date/Time   NA 139 01/28/2016 0929   NA 136 12/12/2015 1317   K 4.3 01/28/2016 0929   K 3.9 12/12/2015 1317   CL 103 12/12/2015 1317   CO2 30* 01/28/2016 0929   CO2 24 12/12/2015 1317   GLUCOSE 102 01/28/2016 0929   GLUCOSE 91 12/12/2015 1317   BUN 10.4 01/28/2016 0929   BUN 17 12/12/2015 1317   CREATININE 0.9 01/28/2016 0929   CREATININE 1.11 12/12/2015 1317   CALCIUM 9.6 01/28/2016 0929   CALCIUM 9.0 12/12/2015 1317   PROT 7.1 01/28/2016 0929   PROT 7.0 09/24/2009 1331   ALBUMIN 3.1* 01/28/2016 0929   ALBUMIN 4.1 09/24/2009 1331   AST 16 01/28/2016 0929   AST 18 09/24/2009 1331   ALT 17 01/28/2016 0929   ALT 40 09/24/2009 1331   ALKPHOS 77 01/28/2016 0929   ALKPHOS 123* 09/24/2009 1331   BILITOT 0.53 01/28/2016 0929   BILITOT 0.3 09/24/2009 1331   GFRNONAA >60 12/12/2015 1317   GFRAA >60 12/12/2015 1317    No results found for: SPEP, UPEP  Lab Results  Component Value Date   WBC 5.2 01/28/2016   NEUTROABS 4.4 01/28/2016   HGB  13.7 01/28/2016   HCT 41.6 01/28/2016   MCV 93.4 01/28/2016   PLT 191 01/28/2016      Chemistry      Component Value Date/Time   NA 139 01/28/2016 0929   NA 136 12/12/2015 1317   K 4.3 01/28/2016 0929   K 3.9 12/12/2015 1317   CL 103 12/12/2015 1317   CO2 30* 01/28/2016 0929   CO2 24 12/12/2015 1317   BUN 10.4 01/28/2016 0929   BUN 17 12/12/2015 1317   CREATININE 0.9 01/28/2016 0929   CREATININE 1.11 12/12/2015 1317      Component Value Date/Time   CALCIUM 9.6 01/28/2016 0929   CALCIUM 9.0 12/12/2015 1317   ALKPHOS 77 01/28/2016 0929   ALKPHOS 123* 09/24/2009 1331   AST 16 01/28/2016 0929   AST 18 09/24/2009 1331   ALT 17 01/28/2016 0929   ALT 40 09/24/2009 1331   BILITOT 0.53 01/28/2016 0929   BILITOT 0.3 09/24/2009 1331      ASSESSMENT & PLAN:  Tonsil cancer ([place]) He is improving I will continue aggressive supportive care and will stop IV fluids this week I plan  to see him back next month and schedule [facility] clinic at the same time.  Mucositis oral He has grade 1 mucositis  His pain is well-controlled since discontinuation of pain medicine He will continue to take morphine intermittently as needed for breakthrough pain  Gagging episode He had frequent gagging from mucous production but overall is improving This is gradually becoming less frequent He denies dysphagia or aspiration with recent oral intake.   Protein-calorie malnutrition, mild ([place]) He is struggling with oral intake but overall feeling better He is 100% dependent on feeding tube now but has attempted to eat by mouth I recommend and encourage him to try oral intake as tolerated     No orders of the defined types were placed in this encounter.   All questions were answered. The patient knows to call the clinic with any problems, questions or concerns. No barriers to learning was detected. I spent 15 minutes counseling the patient face to face. The total time spent in the appointment was 20 minutes and more than 50% was on counseling and review of test results     [facility], [place], MD 03/04/2016 3:22 PM

## 2016-03-04 NOTE — Assessment & Plan Note (Signed)
He is struggling with oral intake but overall feeling better He is 100% dependent on feeding tube now but has attempted to eat by mouth I recommend and encourage him to try oral intake as tolerated

## 2016-03-04 NOTE — Assessment & Plan Note (Signed)
He has grade 1 mucositis  His pain is well-controlled since discontinuation of pain medicine He will continue to take morphine intermittently as needed for breakthrough pain

## 2016-03-04 NOTE — Telephone Encounter (Signed)
left msg for 6/13 apt per provider per pof

## 2016-03-04 NOTE — Patient Instructions (Signed)

## 2016-03-06 ENCOUNTER — Ambulatory Visit (HOSPITAL_BASED_OUTPATIENT_CLINIC_OR_DEPARTMENT_OTHER): Payer: 59

## 2016-03-06 VITALS — BP 134/74 | HR 68 | Temp 98.2°F | Resp 18

## 2016-03-06 DIAGNOSIS — C090 Malignant neoplasm of tonsillar fossa: Secondary | ICD-10-CM | POA: Diagnosis not present

## 2016-03-06 DIAGNOSIS — K1230 Oral mucositis (ulcerative), unspecified: Secondary | ICD-10-CM | POA: Diagnosis not present

## 2016-03-06 MED ORDER — HEPARIN SOD (PORK) LOCK FLUSH 100 UNIT/ML IV SOLN
500.0000 [IU] | Freq: Once | INTRAVENOUS | Status: AC | PRN
  Administered 2016-03-06: 500 [IU]
  Filled 2016-03-06: qty 5

## 2016-03-06 MED ORDER — SODIUM CHLORIDE 0.9 % IJ SOLN
10.0000 mL | INTRAMUSCULAR | Status: DC | PRN
  Administered 2016-03-06: 10 mL
  Filled 2016-03-06: qty 10

## 2016-03-06 MED ORDER — SODIUM CHLORIDE 0.9 % IV SOLN
Freq: Once | INTRAVENOUS | Status: AC
  Administered 2016-03-06: 14:00:00 via INTRAVENOUS

## 2016-03-06 NOTE — Patient Instructions (Signed)

## 2016-03-12 ENCOUNTER — Telehealth: Payer: Self-pay | Admitting: *Deleted

## 2016-03-12 NOTE — Telephone Encounter (Addendum)
  Oncology Nurse Navigator Documentation  Navigator Location: [facility] (03/12/16 1414) Navigator Encounter Type: Telephone (03/12/16 1414) Telephone: Outgoing Call;Patient Update (03/12/16 1414)           Treatment Phase: Post-Tx Follow-up (03/12/16 1414)                       Called Mr. Hefley to check on well being.  He completed chemoradiation 02/03/2016.   He reported:  - Pain:  Soreness posterior roof of mouth, attributes to increased oral intake of solid foods.  Has not needed/taken PRN pain medication for the past 2 weeks - Nutrition/Hydration: Eating soft foods eg scrambled eggs, cheese omelettes, pancakes, sausage from a pizza, chicken pie.  Drinking 3-4 Ensure daily, "lots" of water, sweet tea.  Instilling 1-2 cans Osmolite 1.5 in the evening.  Has lost a couple of pounds but he attributes this to increased activity. - BMs: Regular. - Energy Level:  More active now but finds that he needs to take an afternoon nap. - Mouth:  Thickened saliva persists but is slowly resolving. - Swallowing Exercises:  Stopped when tmt finished.  I encouraged him to resume BID, explained the importance of continuing for 6 months post-tmt at this level until guided by Garald Balding SLP to reduce frequency. - Fluoride Trays:   Using occasionally.  I encouraged resumption of daily use at bedtime, explained the importance of this practice to minimize dental caries r/t reduction in saliva production.  He indicated preference to attend the 6/13 [employer] for his next follow-ups with Glendell Docker SLP and [facility].  I indicated I would inform them.  I reviewed upcoming appts.    He understands he can contact me with needs/concerns.  Gayleen Orem, RN, BSN, [place] at [place] [phone]           Time Spent with Patient: 30 (03/12/16 1414)

## 2016-03-18 ENCOUNTER — Telehealth: Payer: Self-pay

## 2016-03-18 ENCOUNTER — Other Ambulatory Visit (HOSPITAL_BASED_OUTPATIENT_CLINIC_OR_DEPARTMENT_OTHER): Payer: 59

## 2016-03-18 ENCOUNTER — Ambulatory Visit (HOSPITAL_BASED_OUTPATIENT_CLINIC_OR_DEPARTMENT_OTHER): Payer: 59 | Admitting: Nurse Practitioner

## 2016-03-18 VITALS — BP 116/63 | HR 83 | Temp 100.9°F | Resp 20

## 2016-03-18 DIAGNOSIS — C090 Malignant neoplasm of tonsillar fossa: Secondary | ICD-10-CM

## 2016-03-18 DIAGNOSIS — C099 Malignant neoplasm of tonsil, unspecified: Secondary | ICD-10-CM | POA: Diagnosis not present

## 2016-03-18 DIAGNOSIS — D696 Thrombocytopenia, unspecified: Secondary | ICD-10-CM

## 2016-03-18 DIAGNOSIS — R509 Fever, unspecified: Secondary | ICD-10-CM

## 2016-03-18 DIAGNOSIS — Z95828 Presence of other vascular implants and grafts: Secondary | ICD-10-CM

## 2016-03-18 LAB — CBC WITH DIFFERENTIAL/PLATELET
BASO%: 0.2 % (ref 0.0–2.0)
BASOS ABS: 0 10*3/uL (ref 0.0–0.1)
EOS%: 0.5 % (ref 0.0–7.0)
Eosinophils Absolute: 0 10*3/uL (ref 0.0–0.5)
HEMATOCRIT: 39.5 % (ref 38.4–49.9)
HEMOGLOBIN: 13.3 g/dL (ref 13.0–17.1)
LYMPH#: 0.5 10*3/uL — AB (ref 0.9–3.3)
LYMPH%: 12 % — AB (ref 14.0–49.0)
MCH: 31.6 pg (ref 27.2–33.4)
MCHC: 33.7 g/dL (ref 32.0–36.0)
MCV: 93.8 fL (ref 79.3–98.0)
MONO#: 0.3 10*3/uL (ref 0.1–0.9)
MONO%: 6.1 % (ref 0.0–14.0)
NEUT#: 3.6 10*3/uL (ref 1.5–6.5)
NEUT%: 81.2 % — AB (ref 39.0–75.0)
PLATELETS: 78 10*3/uL — AB (ref 140–400)
RBC: 4.21 10*6/uL (ref 4.20–5.82)
RDW: 13.3 % (ref 11.0–14.6)
WBC: 4.4 10*3/uL (ref 4.0–10.3)
nRBC: 0 % (ref 0–0)

## 2016-03-18 LAB — URINALYSIS, MICROSCOPIC - CHCC
BILIRUBIN (URINE): NEGATIVE
BLOOD: NEGATIVE
GLUCOSE UR CHCC: NEGATIVE mg/dL
Ketones: NEGATIVE mg/dL
Nitrite: NEGATIVE
PH: 7.5 (ref 4.6–8.0)
Protein: NEGATIVE mg/dL
SPECIFIC GRAVITY, URINE: 1.01 (ref 1.003–1.035)
Urobilinogen, UR: 0.2 mg/dL (ref 0.2–1)

## 2016-03-18 LAB — COMPREHENSIVE METABOLIC PANEL
ALK PHOS: 86 U/L (ref 40–150)
ALT: 35 U/L (ref 0–55)
ANION GAP: 9 meq/L (ref 3–11)
AST: 26 U/L (ref 5–34)
Albumin: 3.3 g/dL — ABNORMAL LOW (ref 3.5–5.0)
BUN: 17.3 mg/dL (ref 7.0–26.0)
CALCIUM: 8.1 mg/dL — AB (ref 8.4–10.4)
CO2: 21 mEq/L — ABNORMAL LOW (ref 22–29)
Chloride: 106 mEq/L (ref 98–109)
Creatinine: 0.8 mg/dL (ref 0.7–1.3)
Glucose: 88 mg/dl (ref 70–140)
POTASSIUM: 3.9 meq/L (ref 3.5–5.1)
Sodium: 136 mEq/L (ref 136–145)
Total Bilirubin: 0.38 mg/dL (ref 0.20–1.20)
Total Protein: 6.3 g/dL — ABNORMAL LOW (ref 6.4–8.3)

## 2016-03-18 MED ORDER — AMOXICILLIN-POT CLAVULANATE 875-125 MG PO TABS: 1.0000 | ORAL_TABLET | Freq: Two times a day (BID) | ORAL | Status: DC

## 2016-03-18 MED ORDER — SODIUM CHLORIDE 0.9% FLUSH
10.0000 mL | INTRAVENOUS | Status: DC | PRN
  Administered 2016-03-18: 10 mL via INTRAVENOUS
  Filled 2016-03-18: qty 10

## 2016-03-18 MED ORDER — HEPARIN SOD (PORK) LOCK FLUSH 100 UNIT/ML IV SOLN
500.0000 [IU] | Freq: Once | INTRAVENOUS | Status: DC
  Filled 2016-03-18: qty 5

## 2016-03-18 NOTE — Telephone Encounter (Signed)
Beth called for pt with pt in background. He had temp last night of 100. He took tylenol via peg tube. Today fever is 101.8, pt is feeling generally poorly, weak, he has a dull ache in his stomach about 3-4 inches from peg, peg tube site looks good. He denies any respiratory, or gu symptoms. He switched back to tube feeds 2 days ago thinking eating solid foods was causing the stomach ache. His throat is not sore and no sores in his mouth. S/w Cyndee and called pt back to come in right away d/t time constraints. Instructed pt to put EMLA cream on his port. We will be drawing cultures from his port.

## 2016-03-19 ENCOUNTER — Encounter: Payer: Self-pay | Admitting: Hematology and Oncology

## 2016-03-19 ENCOUNTER — Other Ambulatory Visit: Payer: Self-pay | Admitting: Hematology and Oncology

## 2016-03-19 ENCOUNTER — Encounter: Payer: Self-pay | Admitting: Nurse Practitioner

## 2016-03-19 ENCOUNTER — Telehealth: Payer: Self-pay | Admitting: *Deleted

## 2016-03-19 DIAGNOSIS — R509 Fever, unspecified: Secondary | ICD-10-CM

## 2016-03-19 DIAGNOSIS — R1013 Epigastric pain: Secondary | ICD-10-CM

## 2016-03-19 DIAGNOSIS — C099 Malignant neoplasm of tonsil, unspecified: Secondary | ICD-10-CM

## 2016-03-19 DIAGNOSIS — R109 Unspecified abdominal pain: Secondary | ICD-10-CM

## 2016-03-19 HISTORY — DX: Unspecified abdominal pain: R10.9

## 2016-03-19 LAB — URINE CULTURE: ORGANISM ID, BACTERIA: NO GROWTH

## 2016-03-19 NOTE — Assessment & Plan Note (Signed)
Patient completed his Erbitux therapy approximately 8 weeks ago.  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  He is currently undergoing observation only.  See notes regarding today's visit.  Patient is scheduled to return on 04/07/2016 for follow-up visit.  He is scheduled for restaging PET scan on 05/26/2016.

## 2016-03-19 NOTE — Assessment & Plan Note (Signed)
Patient states that he developed a fever yesterday that was low-grade at approximately 100.0.  He was feeling achy all over as well.  He denied any URI, UTI, or GI symptoms.  Patient states that he does experience some intermittent, low-grade generalized abdominal discomfort-but this is been an ongoing complaint for the past several months.  He also states he suffers with some occasional constipation; but states that he took some Maalox and he has had 3 bowel movements today.  Patient states that his temperature reached 101.8 earlier this afternoon.  Initial check of patient's temperature today was 100.9.  Heart rate was 83; and blood pressure was stable.  O2 sat was 98%.  Patient does appear fatigued and uncomfortable.  Abdomen was soft and bowel sounds positive.  There was no obvious tenderness with palpation.  Lungs were clear bilaterally.  Labs obtained today were all within normal limits; with the exception of thrombocytopenia with a platelet count of 78.  Urinalysis obtained today was essentially clear of infection; but will await urine culture results.  Was also able to obtain 1 peripheral stick blood culture which is pending results as well.  Reviewed all findings with Dr. Alvy Bimler; and decision was made to treat patient with Augmentin antibiotics just in case there is a bacterial component to the fever.  Most likely, though, patient's fever is associated with a virus.  Patient was also encouraged to take Tylenol as directed.  Patient was advised to call/return or go directly to the emergency department for any worsening symptoms whatsoever.

## 2016-03-19 NOTE — Telephone Encounter (Signed)
Wife reports pt c/o abd pain upper left which has been intermittent for past few days.  Wife is at work and says pt called her reporting he started driving to our clinic because his abd pain was worse, but then it went away as he was driving here so he returned home.  Pt did have some constipation a few days ago relieved w/ Miralax and now having loose stools which they assume is d/t antibiotic.    Dr. Alvy Bimler discussed w/ Selena Lesser, NP and arranged for pt to have Abd and Chest Xray and see Cyndee again tomorrow morning.   Instructed wife to have pt take his Morphine as needed for pain,  Take tylenol as needed for fevers,  Encourage increase fluid intake- more important to take water at this point than osmolite.   Call us if pt has fever despite tylenol or pain despite the morphine.   Go to [facility] by 9 am tomorrow for xrays and then come back to check in at [facility] for 9:45 am appt w/ Selena Lesser.  Wife verbalized understanding.

## 2016-03-19 NOTE — Progress Notes (Signed)
SYMPTOM MANAGEMENT CLINIC    Chief Complaint: Fever  HPI:  Gregory Jenkins 65 y.o. 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  Patient is currently undergoing observation only.   Patient states that he developed a fever yesterday that was low-grade at approximately 100.0.  He was feeling achy all over as well.  He denied any URI, UTI, or GI symptoms.  Patient states that he does experience some intermittent, low-grade generalized abdominal discomfort-but this is been an ongoing complaint for the past several months.  He also states he suffers with some occasional constipation; but states that he took some Maalox and he has had 3 bowel movements today.  Patient states that his temperature reached 101.8 earlier this afternoon.  Initial check of patient's temperature today was 100.9.  Heart rate was 83; and blood pressure was stable.  O2 sat was 98%.  Patient does appear fatigued and uncomfortable.  Abdomen was soft and bowel sounds positive.  There was no obvious tenderness with palpation.  Lungs were clear bilaterally.  Labs obtained today were all within normal limits; with the exception of thrombocytopenia with a platelet count of 78.  Urinalysis obtained today was essentially clear of infection; but will await urine culture results.  Was also able to obtain 1 peripheral stick blood culture which is pending results as well.  Reviewed all findings with Dr. Alvy Bimler; and decision was made to treat patient with Augmentin antibiotics just in case there is a bacterial component to the fever.  Most likely, though, patient's fever is associated with a virus.  Patient was also encouraged to take Tylenol as directed.  Patient was advised to call/return or go directly to the emergency department for any worsening symptoms whatsoever.    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 Mild asymmetry of the left tonsil -- correlate with direct visualization to assess for primary neoplasm.   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   11/28/2015 PET scan 1. Left palatine tonsil primary with left worse than right cervical nodal metastasis. 2. No extracervical hypermetabolic metastasis.    12/12/2015 Procedure Peg and port-a-cath placement.   12/18/2015 - 01/29/2016 Chemotherapy He received weekly cetuximab   12/18/2015 - 02/03/2016 Radiation Therapy He received concurrent radiation   12/31/2015 Adverse Reaction Treatment #3 is placed on hold due to worsening mucositis   01/08/2016 Miscellaneous Cetuximab #3 resumed with 50% dose adjustment   01/14/2016 Adverse Reaction Treatment #4 is placed on hold due to worsening mucositis and failure to thrive    Review of Systems  Constitutional: Positive for fever, chills and malaise/fatigue.  Neurological: Positive for weakness.  All other systems reviewed and are negative.   Past Medical History  Diagnosis Date  . Allergic rhinitis   . Hypertension   . Thyroid disease   . Clotting disorder (HCC)     PE, suspect lupus anticoagulant  . History of pulmonary embolism   . History of nephrolithiasis   . Drug-induced skin rash 12/25/2015  . Mucositis oral 01/08/2016    Past Surgical History  Procedure Laterality Date  . Eye surgery Right 1980's    H/O right orbital "blowout" fracture  . Foot surgery Left     [facility] Neuroma  . Knee surgery Right  Arthroscopic    has Tonsil cancer ([place]); Protein-calorie malnutrition, mild ([place]); Gagging episode; and Fever on his problem list.    has No Known Allergies.    Medication List       This list is accurate as of: 03/18/16 11:59 PM.  Always use your most recent med list.               amoxicillin-clavulanate  875-125 MG tablet  Commonly known as:  AUGMENTIN  Take 1 tablet by mouth 2 (two) times daily.     aspirin EC 81 MG tablet  Take 81 mg by mouth daily.     clindamycin 1 % gel  Commonly known as:  CLINDAGEL  Apply topically 2 (two) times daily.     feeding supplement (OSMOLITE 1.5 CAL) Liqd  Begin one can Osmolite 1.5, or equivalent, QID with 30 cc free water before and 60 cc free water after. On day 3, increase to 1 1/2 cans BID and continue 2 cans BID as tolerated.On day 5, increase to 1 1/2 cans QID as tolerated.On day 7, increase to goal rate of 1 1/2 cans TID and two cans once for total of 6.5 cans daily.Flush tube or drink addition 32 oz water daily.Send TF and supplies.     hydrocortisone 1 % ointment  Apply 1 application topically 2 (two) times daily.     ibuprofen 200 MG tablet  Commonly known as:  ADVIL,MOTRIN  Take 200 mg by mouth as needed. Reported on 01/27/2016     levothyroxine 75 MCG tablet  Commonly known as:  SYNTHROID, LEVOTHROID  Take 75 mcg by mouth daily before breakfast.     lidocaine 2 % solution  Commonly known as:  XYLOCAINE  Mix 1 part 2%viscous lidocaine,1part H2O.Swish and/or swallow 57m of this mixture,311m before meals and at bedtime, up to QID     LORazepam 0.5 MG tablet  Commonly known as:  ATIVAN  Take 1 tablet up to TID as needed for nausea     morphine 20 MG/ML concentrated solution  Commonly known as:  ROXANOL  Place 0.5 mLs (10 mg total) into feeding tube every 2 (two) hours as needed for severe pain.     ondansetron 8 MG tablet  Commonly known as:  ZOFRAN  Take 1 tablet (8 mg total) by mouth every 8 (eight) hours as needed for nausea.     promethazine 25 MG tablet  Commonly known as:  PHENERGAN  Take 1 tablet (25 mg total) by mouth every 6 (six) hours as needed for nausea.     ranitidine 150 MG capsule  Commonly known as:  ZANTAC  Take 150 mg by mouth 2 (two) times daily. Reported on 01/27/2016     sodium fluoride 1.1 % Gel dental  gel  Commonly known as:  FLUORISHIELD  Instill one drop of gel per tooth space of fluoride tray. Place over teeth for 5 minutes. Remove. Spit out excess. Repeat nightly.     sucralfate 1 g tablet  Commonly known as:  CARAFATE  Dissolve 1 tablet in 1066m2O and swallow up to QID,PRN sore throat.         PHYSICAL EXAMINATION  Oncology Vitals 03/06/2016 03/06/2016  Weight - -  Weight (lbs) - -  Temp - 98.2  Pulse 68 74  Resp 18 18  SpO2 100 97   BP Readings from Last 2 Encounters:  03/06/16 134/74  03/04/16 117/72    Physical Exam  Constitutional: He is oriented to  person, place, and time. He appears unhealthy.  HENT:  Head: Normocephalic and atraumatic.  Mouth/Throat: Oropharynx is clear and moist.  Eyes: Conjunctivae and EOM are normal. Pupils are equal, round, and reactive to light. Right eye exhibits no discharge. Left eye exhibits no discharge. No scleral icterus.  Neck: Normal range of motion. Neck supple. No JVD present. No tracheal deviation present. No thyromegaly present.  Cardiovascular: Normal rate, regular rhythm, normal heart sounds and intact distal pulses.   Pulmonary/Chest: Effort normal and breath sounds normal. No respiratory distress. He has no wheezes. He has no rales. He exhibits no tenderness.  Abdominal: Soft. Bowel sounds are normal. He exhibits no distension and no mass. There is no tenderness. There is no rebound and no guarding.  PEG tube intact; with PEG insertion site well-healed with no evidence of infection.  Musculoskeletal: Normal range of motion. He exhibits no edema or tenderness.  Lymphadenopathy:    He has no cervical adenopathy.  Neurological: He is alert and oriented to person, place, and time. Gait normal.  Skin: Skin is warm and dry. No rash noted. No erythema. No pallor.  Psychiatric: Affect normal.  Nursing note and vitals reviewed.   LABORATORY DATA:. Appointment on 03/18/2016  Component Date Value Ref Range Status  . Glucose  03/18/2016 Negative  Negative mg/dL Final  . Bilirubin (Urine) 03/18/2016 Negative  Negative Final  . Ketones 03/18/2016 Negative  Negative mg/dL Final  . Specific Gravity, Urine 03/18/2016 1.010  1.003 - 1.035 Final  . Blood 03/18/2016 Negative  Negative Final  . pH 03/18/2016 7.5  4.6 - 8.0 Final  . Protein 03/18/2016 Negative  Negative- <30 mg/dL Final  . Urobilinogen, UR 03/18/2016 0.2  0.2 - 1 mg/dL Final  . Nitrite 03/18/2016 Negative  Negative Final  . Leukocyte Esterase 03/18/2016 Trace  Negative Final  . RBC / HPF 03/18/2016 0-2  0 - 2 Final  . WBC, UA 03/18/2016 0-2  0 - 2 Final  . Bacteria, UA 03/18/2016 Few  Negative- Trace Final  . Epithelial Cells 03/18/2016 Occasional  Negative- Few Final  . Mucus, UA 03/18/2016 Small  Negative- Small Final  . WBC 03/18/2016 4.4  4.0 - 10.3 10e3/uL Final  . NEUT# 03/18/2016 3.6  1.5 - 6.5 10e3/uL Final  . HGB 03/18/2016 13.3  13.0 - 17.1 g/dL Final  . HCT 03/18/2016 39.5  38.4 - 49.9 % Final  . Platelets 03/18/2016 78* 140 - 400 10e3/uL Final  . MCV 03/18/2016 93.8  79.3 - 98.0 fL Final  . MCH 03/18/2016 31.6  27.2 - 33.4 pg Final  . MCHC 03/18/2016 33.7  32.0 - 36.0 g/dL Final  . RBC 03/18/2016 4.21  4.20 - 5.82 10e6/uL Final  . RDW 03/18/2016 13.3  11.0 - 14.6 % Final  . lymph# 03/18/2016 0.5* 0.9 - 3.3 10e3/uL Final  . MONO# 03/18/2016 0.3  0.1 - 0.9 10e3/uL Final  . Eosinophils Absolute 03/18/2016 0.0  0.0 - 0.5 10e3/uL Final  . Basophils Absolute 03/18/2016 0.0  0.0 - 0.1 10e3/uL Final  . NEUT% 03/18/2016 81.2* 39.0 - 75.0 % Final  . LYMPH% 03/18/2016 12.0* 14.0 - 49.0 % Final  . MONO% 03/18/2016 6.1  0.0 - 14.0 % Final  . EOS% 03/18/2016 0.5  0.0 - 7.0 % Final  . BASO% 03/18/2016 0.2  0.0 - 2.0 % Final  . nRBC 03/18/2016 0  0 - 0 % Final  . Sodium 03/18/2016 136  136 - 145 mEq/L Final  . Potassium 03/18/2016  3.9  3.5 - 5.1 mEq/L Final  . Chloride 03/18/2016 106  98 - 109 mEq/L Final  . CO2 03/18/2016 21* 22 - 29 mEq/L Final    . Glucose 03/18/2016 88  70 - 140 mg/dl Final   Glucose reference range is for nonfasting patients. Fasting glucose reference range is 70- 100.  Marland Kitchen BUN 03/18/2016 17.3  7.0 - 26.0 mg/dL Final  . Creatinine 03/18/2016 0.8  0.7 - 1.3 mg/dL Final  . Total Bilirubin 03/18/2016 0.38  0.20 - 1.20 mg/dL Final  . Alkaline Phosphatase 03/18/2016 86  40 - 150 U/L Final  . AST 03/18/2016 26  5 - 34 U/L Final  . ALT 03/18/2016 35  0 - 55 U/L Final  . Total Protein 03/18/2016 6.3* 6.4 - 8.3 g/dL Final  . Albumin 03/18/2016 3.3* 3.5 - 5.0 g/dL Final  . Calcium 03/18/2016 8.1* 8.4 - 10.4 mg/dL Final  . Anion Gap 03/18/2016 9  3 - 11 mEq/L Final  . EGFR 03/18/2016 >90  >90 ml/min/1.73 m2 Final   eGFR is calculated using the CKD-EPI Creatinine Equation (2009)    RADIOGRAPHIC STUDIES: No results found.  ASSESSMENT/PLAN:    Tonsil cancer [facility]) Patient completed his Erbitux therapy approximately 8 weeks ago.  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  He is currently undergoing observation only.  See notes regarding today's visit.  Patient is scheduled to return on 04/07/2016 for follow-up visit.  He is scheduled for restaging PET scan on 05/26/2016.  Fever Patient states that he developed a fever yesterday that was low-grade at approximately 100.0.  He was feeling achy all over as well.  He denied any URI, UTI, or GI symptoms.  Patient states that he does experience some intermittent, low-grade generalized abdominal discomfort-but this is been an ongoing complaint for the past several months.  He also states he suffers with some occasional constipation; but states that he took some Maalox and he has had 3 bowel movements today.  Patient states that his temperature reached 101.8 earlier this afternoon.  Initial check of patient's temperature today was 100.9.  Heart rate was 83; and blood pressure was stable.  O2 sat was 98%.  Patient does appear  fatigued and uncomfortable.  Abdomen was soft and bowel sounds positive.  There was no obvious tenderness with palpation.  Lungs were clear bilaterally.  Labs obtained today were all within normal limits; with the exception of thrombocytopenia with a platelet count of 78.  Urinalysis obtained today was essentially clear of infection; but will await urine culture results.  Was also able to obtain 1 peripheral stick blood culture which is pending results as well.  Reviewed all findings with Dr. Alvy Bimler; and decision was made to treat patient with Augmentin antibiotics just in case there is a bacterial component to the fever.  Most likely, though, patient's fever is associated with a virus.  Patient was also encouraged to take Tylenol as directed.  Patient was advised to call/return or go directly to the emergency department for any worsening symptoms whatsoever.       Patient stated understanding of all instructions; and was in agreement with this plan of care. The patient knows to call the clinic with any problems, questions or concerns.   Total time spent with patient was 25 minutes;  with greater than 75 percent of that time spent in face to face counseling regarding patient's symptoms,  and coordination of care and follow up.  Disclaimer:This  dictation was prepared with Dragon/digital dictation along with [employer]. Any transcriptional errors that result from this process are unintentional.  Drue Second, NP 03/19/2016

## 2016-03-20 ENCOUNTER — Telehealth: Payer: Self-pay | Admitting: *Deleted

## 2016-03-20 ENCOUNTER — Ambulatory Visit (HOSPITAL_COMMUNITY)
Admission: RE | Admit: 2016-03-20 | Discharge: 2016-03-20 | Disposition: A | Discharge status: Home / Self Care | Payer: 59 | Source: Ambulatory Visit | Attending: Nurse Practitioner | Admitting: Nurse Practitioner

## 2016-03-20 ENCOUNTER — Encounter: Payer: Self-pay | Admitting: Nurse Practitioner

## 2016-03-20 ENCOUNTER — Ambulatory Visit (HOSPITAL_BASED_OUTPATIENT_CLINIC_OR_DEPARTMENT_OTHER): Payer: 59 | Admitting: Nurse Practitioner

## 2016-03-20 ENCOUNTER — Ambulatory Visit (HOSPITAL_COMMUNITY)
Admission: RE | Admit: 2016-03-20 | Discharge: 2016-03-20 | Disposition: A | Discharge status: Home / Self Care | Payer: 59 | Source: Ambulatory Visit | Attending: Hematology and Oncology | Admitting: Hematology and Oncology

## 2016-03-20 VITALS — BP 104/55 | HR 77 | Temp 97.6°F | Resp 18 | Ht 71.5 in | Wt 172.5 lb

## 2016-03-20 DIAGNOSIS — D696 Thrombocytopenia, unspecified: Secondary | ICD-10-CM

## 2016-03-20 DIAGNOSIS — R1084 Generalized abdominal pain: Secondary | ICD-10-CM | POA: Diagnosis not present

## 2016-03-20 DIAGNOSIS — R509 Fever, unspecified: Secondary | ICD-10-CM

## 2016-03-20 DIAGNOSIS — C099 Malignant neoplasm of tonsil, unspecified: Secondary | ICD-10-CM

## 2016-03-20 DIAGNOSIS — R1013 Epigastric pain: Secondary | ICD-10-CM | POA: Insufficient documentation

## 2016-03-20 DIAGNOSIS — K8020 Calculus of gallbladder without cholecystitis without obstruction: Secondary | ICD-10-CM | POA: Diagnosis not present

## 2016-03-20 DIAGNOSIS — R935 Abnormal findings on diagnostic imaging of other abdominal regions, including retroperitoneum: Secondary | ICD-10-CM | POA: Insufficient documentation

## 2016-03-20 DIAGNOSIS — R109 Unspecified abdominal pain: Secondary | ICD-10-CM

## 2016-03-20 DIAGNOSIS — Z931 Gastrostomy status: Secondary | ICD-10-CM | POA: Insufficient documentation

## 2016-03-20 MED ORDER — IOPAMIDOL (ISOVUE-300) INJECTION 61%
100.0000 mL | Freq: Once | INTRAVENOUS | Status: AC | PRN
  Administered 2016-03-20: 100 mL via INTRAVENOUS

## 2016-03-20 MED ORDER — DIATRIZOATE MEGLUMINE & SODIUM 66-10 % PO SOLN
30.0000 mL | Freq: Once | ORAL | Status: AC
  Administered 2016-03-20: 30 mL via ORAL

## 2016-03-20 NOTE — Telephone Encounter (Signed)
FYI Call received from [place] with [facility] that a message was left earlier today with results.  Wants to ensure receipt.  No message received via triage voicemail. Results printed for ordering provider.

## 2016-03-22 ENCOUNTER — Encounter: Payer: Self-pay | Admitting: Nurse Practitioner

## 2016-03-22 NOTE — Progress Notes (Signed)
SYMPTOM MANAGEMENT CLINIC    Chief Complaint: Fever  HPI:  Gregory Jenkins 65 y.o. 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  Patient is currently undergoing observation only.  Patient in today for follow-up of his intermittent fevers and abdominal discomfort.    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 Mild asymmetry of the left tonsil -- correlate with direct visualization to assess for primary neoplasm.   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   11/28/2015 PET scan 1. Left palatine tonsil primary with left worse than right cervical nodal metastasis. 2. No extracervical hypermetabolic metastasis.    12/12/2015 Procedure Peg and port-a-cath placement.   12/18/2015 - 01/29/2016 Chemotherapy He received weekly cetuximab   12/18/2015 - 02/03/2016 Radiation Therapy He received concurrent radiation   12/31/2015 Adverse Reaction Treatment #3 is placed on hold due to worsening mucositis   01/08/2016 Miscellaneous Cetuximab #3 resumed with 50% dose adjustment   01/14/2016 Adverse Reaction Treatment #4 is placed on hold due to worsening mucositis and failure to thrive    Review of Systems  Constitutional: Positive for fever, chills and malaise/fatigue.  Gastrointestinal: Positive for abdominal pain.  Neurological: Positive for weakness.  All other systems reviewed and are negative.   Past Medical History  Diagnosis Date  . Allergic rhinitis   . Hypertension   . Thyroid disease   . Clotting disorder (HCC)     PE, suspect lupus anticoagulant  . History of pulmonary embolism   . History of nephrolithiasis   . Drug-induced skin rash 12/25/2015  . Mucositis oral 01/08/2016  . Abdominal pain  03/19/2016    Past Surgical History  Procedure Laterality Date  . Eye surgery Right 1980's    H/O right orbital "blowout" fracture  . Foot surgery Left     [facility] Neuroma  . Knee surgery Right     Arthroscopic    has Tonsil cancer ([place]); Protein-calorie malnutrition, mild ([place]); Gagging episode; and Fever on his problem list.    has No Known Allergies.    Medication List       This list is accurate as of: 03/20/16 11:59 PM.  Always use your most recent med list.               amoxicillin-clavulanate 875-125 MG tablet  Commonly known as:  AUGMENTIN  Take 1 tablet by mouth 2 (two) times daily.     aspirin EC 81 MG tablet  Take 81 mg by mouth daily.     clindamycin 1 % gel  Commonly known as:  CLINDAGEL  Apply topically 2 (two) times daily.     feeding supplement (OSMOLITE 1.5 CAL) Liqd  Begin one can Osmolite 1.5, or equivalent, QID with 30 cc free water before and 60 cc free water after. On day 3, increase to 1 1/2 cans BID and continue 2 cans BID as tolerated.On day 5, increase to 1 1/2 cans QID as tolerated.On day 7, increase to goal rate of 1 1/2 cans TID and two cans once for total of 6.5 cans daily.Flush tube or drink addition 32 oz water daily.Send TF and supplies.     hydrocortisone 1 % ointment  Apply 1 application topically 2 (two) times daily.     ibuprofen 200 MG  tablet  Commonly known as:  ADVIL,MOTRIN  Take 200 mg by mouth as needed. Reported on 01/27/2016     levothyroxine 75 MCG tablet  Commonly known as:  SYNTHROID, LEVOTHROID  Take 75 mcg by mouth daily before breakfast.     lidocaine 2 % solution  Commonly known as:  XYLOCAINE  Mix 1 part 2%viscous lidocaine,1part H2O.Swish and/or swallow 34m of this mixture,386m before meals and at bedtime, up to QID     lidocaine-prilocaine cream  Commonly known as:  EMLA     LORazepam 0.5 MG tablet  Commonly known as:  ATIVAN  Take 1 tablet up to TID as needed for nausea     morphine 20 MG/ML  concentrated solution  Commonly known as:  ROXANOL  Place 0.5 mLs (10 mg total) into feeding tube every 2 (two) hours as needed for severe pain.     ondansetron 8 MG tablet  Commonly known as:  ZOFRAN  Take 1 tablet (8 mg total) by mouth every 8 (eight) hours as needed for nausea.     oxyCODONE-acetaminophen 5-325 MG tablet  Commonly known as:  PERCOCET/ROXICET  Take 1 tablet by mouth. Reported on 03/20/2016     promethazine 25 MG tablet  Commonly known as:  PHENERGAN  Take 1 tablet (25 mg total) by mouth every 6 (six) hours as needed for nausea.     ranitidine 150 MG capsule  Commonly known as:  ZANTAC  Take 150 mg by mouth 2 (two) times daily. Reported on 03/20/2016     sodium fluoride 1.1 % Gel dental gel  Commonly known as:  FLUORISHIELD  Instill one drop of gel per tooth space of fluoride tray. Place over teeth for 5 minutes. Remove. Spit out excess. Repeat nightly.     sucralfate 1 g tablet  Commonly known as:  CARAFATE  Dissolve 1 tablet in 1028m2O and swallow up to QID,PRN sore throat.         PHYSICAL EXAMINATION  Oncology Vitals 03/20/2016 03/19/2016  Height 182 cm -  Weight 78.245 kg -  Weight (lbs) 172 lbs 8 oz -  BMI (kg/m2) 23.72 kg/m2 -  Temp 97.6 100.9  Pulse 77 83  Resp 18 20  SpO2 100 98  BSA (m2) 1.99 m2 -   BP Readings from Last 2 Encounters:  03/20/16 104/55  03/19/16 116/63    Physical Exam  Constitutional: He is oriented to person, place, and time. He appears unhealthy.  HENT:  Head: Normocephalic and atraumatic.  Mouth/Throat: Oropharynx is clear and moist.  Eyes: Conjunctivae and EOM are normal. Pupils are equal, round, and reactive to light. Right eye exhibits no discharge. Left eye exhibits no discharge. No scleral icterus.  Neck: Normal range of motion. Neck supple. No JVD present. No tracheal deviation present. No thyromegaly present.  Cardiovascular: Normal rate, regular rhythm, normal heart sounds and intact distal pulses.     Pulmonary/Chest: Effort normal and breath sounds normal. No respiratory distress. He has no wheezes. He has no rales. He exhibits no tenderness.  Abdominal: Soft. Bowel sounds are normal. He exhibits no distension and no mass. There is no tenderness. There is no rebound and no guarding.  PEG tube intact; with PEG insertion site well-healed with no evidence of infection.  Musculoskeletal: Normal range of motion. He exhibits no edema or tenderness.  Lymphadenopathy:    He has no cervical adenopathy.  Neurological: He is alert and oriented to person, place, and time. Gait normal.  Skin: Skin  is warm and dry. No rash noted. No erythema. No pallor.  Psychiatric: Affect normal.  Nursing note and vitals reviewed.   LABORATORY DATA:. Appointment on 03/18/2016  Component Date Value Ref Range Status  . Glucose 03/18/2016 Negative  Negative mg/dL Final  . Bilirubin (Urine) 03/18/2016 Negative  Negative Final  . Ketones 03/18/2016 Negative  Negative mg/dL Final  . Specific Gravity, Urine 03/18/2016 1.010  1.003 - 1.035 Final  . Blood 03/18/2016 Negative  Negative Final  . pH 03/18/2016 7.5  4.6 - 8.0 Final  . Protein 03/18/2016 Negative  Negative- <30 mg/dL Final  . Urobilinogen, UR 03/18/2016 0.2  0.2 - 1 mg/dL Final  . Nitrite 03/18/2016 Negative  Negative Final  . Leukocyte Esterase 03/18/2016 Trace  Negative Final  . RBC / HPF 03/18/2016 0-2  0 - 2 Final  . WBC, UA 03/18/2016 0-2  0 - 2 Final  . Bacteria, UA 03/18/2016 Few  Negative- Trace Final  . Epithelial Cells 03/18/2016 Occasional  Negative- Few Final  . Mucus, UA 03/18/2016 Small  Negative- Small Final  . Urine Culture, Routine 03/18/2016 Final report   Final  . Urine Culture result 1 03/18/2016 No growth   Final  . WBC 03/18/2016 4.4  4.0 - 10.3 10e3/uL Final  . NEUT# 03/18/2016 3.6  1.5 - 6.5 10e3/uL Final  . HGB 03/18/2016 13.3  13.0 - 17.1 g/dL Final  . HCT 03/18/2016 39.5  38.4 - 49.9 % Final  . Platelets 03/18/2016 78* 140  - 400 10e3/uL Final  . MCV 03/18/2016 93.8  79.3 - 98.0 fL Final  . MCH 03/18/2016 31.6  27.2 - 33.4 pg Final  . MCHC 03/18/2016 33.7  32.0 - 36.0 g/dL Final  . RBC 03/18/2016 4.21  4.20 - 5.82 10e6/uL Final  . RDW 03/18/2016 13.3  11.0 - 14.6 % Final  . lymph# 03/18/2016 0.5* 0.9 - 3.3 10e3/uL Final  . MONO# 03/18/2016 0.3  0.1 - 0.9 10e3/uL Final  . Eosinophils Absolute 03/18/2016 0.0  0.0 - 0.5 10e3/uL Final  . Basophils Absolute 03/18/2016 0.0  0.0 - 0.1 10e3/uL Final  . NEUT% 03/18/2016 81.2* 39.0 - 75.0 % Final  . LYMPH% 03/18/2016 12.0* 14.0 - 49.0 % Final  . MONO% 03/18/2016 6.1  0.0 - 14.0 % Final  . EOS% 03/18/2016 0.5  0.0 - 7.0 % Final  . BASO% 03/18/2016 0.2  0.0 - 2.0 % Final  . nRBC 03/18/2016 0  0 - 0 % Final  . Sodium 03/18/2016 136  136 - 145 mEq/L Final  . Potassium 03/18/2016 3.9  3.5 - 5.1 mEq/L Final  . Chloride 03/18/2016 106  98 - 109 mEq/L Final  . CO2 03/18/2016 21* 22 - 29 mEq/L Final  . Glucose 03/18/2016 88  70 - 140 mg/dl Final   Glucose reference range is for nonfasting patients. Fasting glucose reference range is 70- 100.  Marland Kitchen BUN 03/18/2016 17.3  7.0 - 26.0 mg/dL Final  . Creatinine 03/18/2016 0.8  0.7 - 1.3 mg/dL Final  . Total Bilirubin 03/18/2016 0.38  0.20 - 1.20 mg/dL Final  . Alkaline Phosphatase 03/18/2016 86  40 - 150 U/L Final  . AST 03/18/2016 26  5 - 34 U/L Final  . ALT 03/18/2016 35  0 - 55 U/L Final  . Total Protein 03/18/2016 6.3* 6.4 - 8.3 g/dL Final  . Albumin 03/18/2016 3.3* 3.5 - 5.0 g/dL Final  . Calcium 03/18/2016 8.1* 8.4 - 10.4 mg/dL Final  . [place] 03/18/2016  9  3 - 11 mEq/L Final  . EGFR 03/18/2016 >90  >90 ml/min/1.73 m2 Final   eGFR is calculated using the CKD-EPI Creatinine Equation (2009)  . BLOOD CULTURE, ROUTINE 03/18/2016 Preliminary report   Preliminary  . RESULT 1 03/18/2016 Comment   Preliminary   No growth in 36 - 48 hours.    RADIOGRAPHIC STUDIES: Dg Chest 2 View  03/20/2016  CLINICAL DATA:  Tonsil cancer.   Fever EXAM: CHEST  2 VIEW COMPARISON:  CT chest 10/10/2010 FINDINGS: Heart size and vascularity normal. Lungs are clear without infiltrate or effusion. Negative for mass or lung nodule. Port-A-Cath tip in the SVC in good position. No significant skeletal abnormality. IMPRESSION: No active cardiopulmonary disease. Electronically Signed   By: Franchot Gallo M.D.   On: 03/20/2016 09:09   Ct Abdomen Pelvis W Contrast  03/20/2016  CLINICAL DATA:  Head neck cancer. Percutaneous gastrostomy tube. Abdominal pain 3 to 4 days. EXAM: CT ABDOMEN AND PELVIS WITH CONTRAST TECHNIQUE: Multidetector CT imaging of the abdomen and pelvis was performed using the standard protocol following bolus administration of intravenous contrast. CONTRAST:  126m ISOVUE-300 IOPAMIDOL (ISOVUE-300) INJECTION 61% COMPARISON:  PET-CT 11/28/2015, CT 09/18/09 FINDINGS: Lower chest: Lung bases are clear. Hepatobiliary: No focal hepatic lesion. The gallbladder wall thickening to 5 mm with a small amount of pericholecystic fluid. Several gallstones within the gallbladder. The gallbladder is not distended measuring 3 cm in diameter. Pancreas: Pancreas is normal. No ductal dilatation. No pancreatic inflammation. Spleen: Normal spleen Adrenals/urinary tract: Adrenal glands and kidneys are normal. The ureters and bladder normal. Stomach/Bowel: Percutaneous gastrostomy tube retention balloon within the gastric antrum. Stomach, small bowel, appendix, and cecum are normal. The colon and rectosigmoid colon are normal. Vascular/Lymphatic: Abdominal aorta is normal caliber. There is no retroperitoneal or periportal lymphadenopathy. No pelvic lymphadenopathy. Reproductive: Prostate normal Other: No free fluid. Musculoskeletal: No aggressive osseous lesion. 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 Hemangioma L2 vertebral body. IMPRESSION: Mild gallbladder wall thickening with pericholecystic fluid and several gallstones. The gallbladder is  nondistended. Recommend clinical correlation for acute or chronic cholecystitis. Percutaneous gastrostomy in proper orientation. These results will be called to the ordering clinician or representative by the Radiologist Assistant, and communication documented in the PACS or zVision Dashboard. Electronically Signed   By: SSuzy BouchardM.D.   On: 03/20/2016 13:34    ASSESSMENT/PLAN:    Tonsil cancer ([facility] Patient completed his Erbitux therapy approximately 8 weeks ago.  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  He is currently undergoing observation only.  See notes regarding today's visit.  Patient is scheduled to return on 04/07/2016 for follow-up visit.  He is scheduled for restaging PET scan on 05/26/2016.    Fever Patient states that he developed a fever yesterday that was low-grade at approximately 100.0.  He was feeling achy all over as well.  He denied any URI, UTI, or GI symptoms.  Patient states that he does experience some intermittent, low-grade generalized abdominal discomfort-but this is been an ongoing complaint for the past several months.  He also states he suffers with some occasional constipation; but states that he took some Maalox and he has had 3 bowel movements today.  Patient states that his temperature reached 101.8 earlier this afternoon.  Initial check of patient's temperature today was 100.9.  Heart rate was 83; and blood pressure was stable.  O2 sat was 98%.  Patient does appear fatigued and uncomfortable.  Abdomen  was soft and bowel sounds positive.  There was no obvious tenderness with palpation.  Lungs were clear bilaterally.  Labs obtained today were all within normal limits; with the exception of thrombocytopenia with a platelet count of 78.  Urinalysis obtained today was essentially clear of infection; but will await urine culture results.  Was also able to obtain 1 peripheral stick blood culture which is  pending results as well.  Reviewed all findings with Dr. Alvy Bimler; and decision was made to treat patient with Augmentin antibiotics just in case there is a bacterial component to the fever.  Most likely, though, patient's fever is associated with a virus.  Patient was also encouraged to take Tylenol as directed.  Patient was advised to call/return or go directly to the emergency department for any worsening symptoms whatsoever. _________________________________________________________________  Patient in for follow-up.  Patient states that all he developed increasing abdominal discomfort yesterday; and has started driving to the [place].  The pain completely resolved; he turned around and went back home.  He continues to experience intermittent episodes of vague central abdominal discomfort.  He continues to deny any nausea or vomiting.  He states that he has been taking the Augmentin as directed; but still had a fever of 100.3 earlier today.  On exam today.  Patient was afebrile.  Chest x-ray obtained today revealed no acute findings.  Patient then underwent a CT with contrast of the abdomen/pelvis for further evaluation of fever source and pain.  CT of the abdomen/pelvis revealed:  IMPRESSION: Mild gallbladder wall thickening with pericholecystic fluid and several gallstones. The gallbladder is nondistended. Recommend clinical correlation for acute or chronic cholecystitis.  Percutaneous gastrostomy in proper orientation.  Reviewed all findings in detail with Dr. Burr Medico on call physician.Dr. Burr Medico in to briefly examine pt as well; and CT scan results were reviewed with patient and his wife.  Patient has a known history of gallstone; but no specific pain on exam to the right upper quadrant.  Fever could be likely to a viral syndrome; but patient was also advised to monitor for any worsening symptoms whatsoever That could be associated with cholecystitis.  He was advised to  call/return or go directly to the emergency department over the weekend for any worsening issues whatsoever.          Patient stated understanding of all instructions; and was in agreement with this plan of care. The patient knows to call the clinic with any problems, questions or concerns.   Total time spent with patient was 40 minutes;  with greater than 75 percent of that time spent in face to face counseling regarding patient's symptoms,  and coordination of care and follow up.  Disclaimer:This dictation was prepared with Dragon/digital dictation along with [employer]. Any transcriptional errors that result from this process are unintentional.  Drue Second, NP 03/22/2016

## 2016-03-22 NOTE — Assessment & Plan Note (Signed)
Patient states that he developed a fever yesterday that was low-grade at approximately 100.0.  He was feeling achy all over as well.  He denied any URI, UTI, or GI symptoms.  Patient states that he does experience some intermittent, low-grade generalized abdominal discomfort-but this is been an ongoing complaint for the past several months.  He also states he suffers with some occasional constipation; but states that he took some Maalox and he has had 3 bowel movements today.  Patient states that his temperature reached 101.8 earlier this afternoon.  Initial check of patient's temperature today was 100.9.  Heart rate was 83; and blood pressure was stable.  O2 sat was 98%.  Patient does appear fatigued and uncomfortable.  Abdomen was soft and bowel sounds positive.  There was no obvious tenderness with palpation.  Lungs were clear bilaterally.  Labs obtained today were all within normal limits; with the exception of thrombocytopenia with a platelet count of 78.  Urinalysis obtained today was essentially clear of infection; but will await urine culture results.  Was also able to obtain 1 peripheral stick blood culture which is pending results as well.  Reviewed all findings with Dr. Alvy Bimler; and decision was made to treat patient with Augmentin antibiotics just in case there is a bacterial component to the fever.  Most likely, though, patient's fever is associated with a virus.  Patient was also encouraged to take Tylenol as directed.  Patient was advised to call/return or go directly to the emergency department for any worsening symptoms whatsoever. _________________________________________________________________  Patient in for follow-up.  Patient states that all he developed increasing abdominal discomfort yesterday; and has started driving to the [place].  The pain completely resolved; he turned around and went back home.  He continues to experience intermittent episodes of vague central  abdominal discomfort.  He continues to deny any nausea or vomiting.  He states that he has been taking the Augmentin as directed; but still had a fever of 100.3 earlier today.  On exam today.  Patient was afebrile.  Chest x-ray obtained today revealed no acute findings.  Patient then underwent a CT with contrast of the abdomen/pelvis for further evaluation of fever source and pain.  CT of the abdomen/pelvis revealed:  IMPRESSION: Mild gallbladder wall thickening with pericholecystic fluid and several gallstones. The gallbladder is nondistended. Recommend clinical correlation for acute or chronic cholecystitis.  Percutaneous gastrostomy in proper orientation.  Reviewed all findings in detail with Dr. Burr Medico on call physician.Dr. Burr Medico in to briefly examine pt as well; and CT scan results were reviewed with patient and his wife.  Patient has a known history of gallstone; but no specific pain on exam to the right upper quadrant.  Fever could be likely to a viral syndrome; but patient was also advised to monitor for any worsening symptoms whatsoever That could be associated with cholecystitis.  He was advised to call/return or go directly to the emergency department over the weekend for any worsening issues whatsoever.

## 2016-03-22 NOTE — Assessment & Plan Note (Signed)
Patient completed his Erbitux therapy approximately 8 weeks ago.  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  He is currently undergoing observation only.  See notes regarding today's visit.  Patient is scheduled to return on 04/07/2016 for follow-up visit.  He is scheduled for restaging PET scan on 05/26/2016.

## 2016-03-24 ENCOUNTER — Telehealth: Payer: Self-pay | Admitting: *Deleted

## 2016-03-24 LAB — CULTURE, BLOOD (SINGLE)

## 2016-03-24 NOTE — Telephone Encounter (Signed)
Pt's wife states pt is feeling better.  No abd pain since early Saturday morning.  He stayed on tube feedings all weekend to give his stomach a break.  He plans to slowly re-introduce light foods by mouth starting today.  He had temp 99.5 F yesterday and no temp this morning.   He will complete his antibiotic.   Wife says she will call us if his fever goes up over 100.5 F or he has any return of his abdominal pain or any other new symptoms or concerns.  If pt continues to improve they will keep appt w/ Dr. Alvy Bimler as scheduled on 04/07/16.

## 2016-03-24 NOTE — Telephone Encounter (Signed)
-----   Message from Heath Lark, MD sent at 03/24/2016  7:50 AM EDT ----- Regarding: call patient for update Can you call and ask how he is doing? Thanks ----- Message -----    From: Susanne Borders, NP    Sent: 03/22/2016   2:01 PM      To: Heath Lark, MD  Pt back in for follow up this past Friday.  He was currently having no abdominal discomfort whatsoever; but continues to have some intermittent pain.  He also states he had a fever to 100.3 earlier this morning; despite taking Augmentin antibiotics.    Chest x-ray was okay.  CT abdomen and pelvis with contrast revealed questionable mild cholecystitis.  Had Dr. Burr Medico briefly examined.  Patient is well; and she did not feel the patient was experiencing acute cholecystitis.  Patient was advised to continue taking his antibiotics and to report to the emergency department over the weekend with any worsening symptoms whatsoever.  He is scheduled to see you on 04/07/2016.  He may want to have your nurse follow-up with the patient on Tuesday morning to make sure he is doing better.

## 2016-03-24 NOTE — Telephone Encounter (Signed)
Voicemail retrieved from [facility] scheduling.  "Patient reports a 'Mix Up' in the office with radiology test ordering.  Please call to clarify orders. "  Voicemail transferred to ext [phone].

## 2016-04-02 ENCOUNTER — Telehealth: Payer: Self-pay | Admitting: *Deleted

## 2016-04-02 NOTE — Telephone Encounter (Signed)
  Oncology Nurse Navigator Documentation  Navigator Location: [facility] (04/02/16 1603) Navigator Encounter Type: Telephone (04/02/16 1603) Telephone: Appt Confirmation/Clarification (04/02/16 1603)                     Spoke with Mr. Pink, confirmed his attendance at next Tuesday morning's [facility] with an 0800 arrival.  He voiced understanding.  Gayleen Orem, RN, BSN, [place] at [place] [phone]                       Time Spent with Patient: 15 (04/02/16 1603)

## 2016-04-07 ENCOUNTER — Encounter: Payer: Self-pay | Admitting: *Deleted

## 2016-04-07 ENCOUNTER — Other Ambulatory Visit: Payer: Self-pay | Admitting: Hematology and Oncology

## 2016-04-07 ENCOUNTER — Ambulatory Visit: Payer: 59 | Attending: Radiation Oncology

## 2016-04-07 ENCOUNTER — Encounter: Payer: Self-pay | Admitting: Hematology and Oncology

## 2016-04-07 ENCOUNTER — Ambulatory Visit
Admission: RE | Admit: 2016-04-07 | Discharge: 2016-04-07 | Disposition: A | Discharge status: Home / Self Care | Payer: 59 | Source: Ambulatory Visit | Attending: Radiation Oncology | Admitting: Radiation Oncology

## 2016-04-07 ENCOUNTER — Ambulatory Visit: Payer: 59 | Admitting: Nutrition

## 2016-04-07 ENCOUNTER — Ambulatory Visit (HOSPITAL_BASED_OUTPATIENT_CLINIC_OR_DEPARTMENT_OTHER): Payer: 59 | Admitting: Hematology and Oncology

## 2016-04-07 VITALS — BP 102/69 | HR 62 | Temp 97.7°F | Resp 18 | Wt 175.5 lb

## 2016-04-07 DIAGNOSIS — C099 Malignant neoplasm of tonsil, unspecified: Secondary | ICD-10-CM

## 2016-04-07 DIAGNOSIS — Z931 Gastrostomy status: Secondary | ICD-10-CM | POA: Diagnosis not present

## 2016-04-07 DIAGNOSIS — R1310 Dysphagia, unspecified: Secondary | ICD-10-CM | POA: Diagnosis not present

## 2016-04-07 NOTE — Progress Notes (Signed)
Patient was seen in head and neck clinic.  Nutrition follow-up completed with patient and wife status post treatment for tonsil cancer.  Patient completed treatment 10 weeks ago. Patient is consuming 6 bottles of equate plus by mouth every day in addition to soft foods. He has been using 2 cans of Osmolite 1.5 at nighttime via PEG simply because he has them available and has to flush his feeding tube anyway. Patient reports continued dry mouth. Denies mouth sores, nausea and vomiting or constipation/Diarrhea. Weight was documented as 175.5 pounds down slightly from 179 pounds May 3.  Nutrition diagnosis: Inadequate oral intake improved.  Intervention:  Recommended patient continue to work to increase calories and protein in soft foods throughout the day. Recommended patient continue to drink oral nutrition supplements 6-8 bottles daily. Recommended patient discontinue use of his feeding tube but to continue flushing with water on a daily basis until tube can be removed. Stressed the importance of weight maintenance. Questions were answered.  Teach back method used.  Monitoring, evaluation, goals: Patient will tolerate oral intake by mouth to promote weight maintenance.  Next visit: To be scheduled as needed.  **Disclaimer: This note was dictated with voice recognition software. Similar sounding words can inadvertently be transcribed and this note may contain transcription errors which may not have been corrected upon publication of note.**

## 2016-04-07 NOTE — Assessment & Plan Note (Signed)
He has almost recovered fully from side effects from treatment. He has appointment scheduled for PET/CT scan in August If PET/CT scan is negative, I will write orders for port removal I will transition his care to cancer survivorship clinic

## 2016-04-07 NOTE — Progress Notes (Addendum)
  Oncology Nurse Navigator Documentation  Navigator Location: [facility] (04/07/16 0815) Navigator Encounter Type: Clinic/MDC (04/07/16 0815)                       Met with Mr. Koska during H&N MDC.  He was accompanied by his wife.  Arrived him/her to Nursing, provided verbal and written overview of [place], the clinicians who will be seeing him, encouraged him to ask questions during their time with him.  He was seen by Nutrition, SLP and LCSW as post-treatment follow-up.  Spoke with them after [place], they denied having any questions.  Gayleen Orem, RN, BSN, [place] at [place] [phone]                         Time Spent with Patient: 45 (04/07/16 0815)

## 2016-04-07 NOTE — Assessment & Plan Note (Signed)
He is eating 80% of intake by mouth. He continues to use feeding tube and is gaining weight. I recommend feeding tube removal in the new future. He will call the office when he is ready

## 2016-04-07 NOTE — Therapy (Signed)
[facility] [address] [place], [place], [address] Phone: [phone]   Fax:  [phone]  Speech Language Pathology Treatment  Patient Details  Name: Gregory Jenkins MRN: [id number] Date of Birth: 1951-08-16 Referring Provider: Eppie Gibson, M.D.  Encounter Date: 04/07/2016      End of Session - 04/07/16 1257    Visit Number 4   Number of Visits 7   Date for SLP Re-Evaluation 06/08/16   SLP Start Time 0923   SLP Stop Time  1001   SLP Time Calculation (min) 38 min   Activity Tolerance Patient tolerated treatment well      Past Medical History  Diagnosis Date  . Allergic rhinitis   . Hypertension   . Thyroid disease   . Clotting disorder (HCC)     PE, suspect lupus anticoagulant  . History of pulmonary embolism   . History of nephrolithiasis   . Drug-induced skin rash 12/25/2015  . Mucositis oral 01/08/2016  . Abdominal pain 03/19/2016    Past Surgical History  Procedure Laterality Date  . Eye surgery Right 1980's    H/O right orbital "blowout" fracture  . Foot surgery Left     [place] Neuroma  . Knee surgery Right     Arthroscopic    There were no vitals filed for this visit.      Subjective Assessment - 04/07/16 0941    Subjective Pt is eating a good variety of dys II-III foods.   Patient is accompained by: --  wife               ADULT SLP TREATMENT - 04/07/16 0943    General Information   Behavior/Cognition Alert;Cooperative;Pleasant mood   Treatment Provided   Treatment provided Dysphagia   Dysphagia Treatment   Temperature Spikes Noted Yes  virus last part of May 2017   Treatment Methods Skilled observation;Therapeutic exercise   Patient observed directly with PO's Yes   Other treatment/comments Pt is having 2 cans tube feed/day and has a good variety of dys II-III items in very small amounts. SLP encouraged pt to eat more PO, three times a day. Pt did not have lunch normally  pre-radiation. He admitted to completing HEP 2-3 times a week and SLP reminded pt of reason for HEP completion after pt provided semi-accurate reason for HEP completion. Pt req'd min A rarely for completion of HEP. He provided 3 s/s aspiration PNA wtih modified independence.   Assessment / Recommendations / Plan   Plan Continue with current plan of care   Dysphagia Recommendations   Diet recommendations Thin liquid;Other(comment)  as tolerated   Liquids provided via Cup   Medication Administration Other (Comment)  with liquid or puree   Progression Toward Goals   Progression toward goals Progressing toward goals          SLP Education - 04/07/16 1256    Education provided Yes   Education Details reminding of need of frequency of HEP daily, s/s aspiration PNA   Person(s) Educated Patient;Spouse   Methods Explanation   Comprehension Verbalized understanding          SLP Short Term Goals - 04/07/16 1259    SLP SHORT TERM GOAL #1   Title pt will perform HEP with rare min A   Status Achieved   SLP SHORT TERM GOAL #2   Title pt will tell SLP why he is completing HEP   Status Not Met   SLP SHORT TERM GOAL #3  Title pt will tell SLP 3 s/s aspiration PNA with modified independence   Period --  visits   Status Achieved          SLP Long Term Goals - 04/07/16 1259    SLP LONG TERM GOAL #1   Title pt will perform HEP with modified independence over two sessions   Time 2   Period --  visits   Status On-going   SLP LONG TERM GOAL #2   Title pt will tell SLP how a food journal can expediate return to regular diet following chemo/rad tx   Time 2   Period --  visits   Status On-going   SLP LONG TERM GOAL #3   Title pt will maintain performance of HEP independently over three sessions   Time 4   Period --  visits   Status On-going   SLP LONG TERM GOAL #4   Title pt will tell SLP date he modifies HEP frequency to 2-3x/week over two sessions   Time 5   Period --   visits   Status On-going          Plan - 04/07/16 1258    Clinical Impression Statement Pt presents today progressing back onto PO diet, without overt s/s aspiration/ aspiration PNA. Pt would benefit from cont'd ST to assess HEP procedure and assess safety with POs.   Speech Therapy Frequency --  approx every four weeks   Duration --  3 visits   Treatment/Interventions Aspiration precaution training;Pharyngeal strengthening exercises;Compensatory techniques;Diet toleration management by SLP;Trials of upgraded texture/liquids;Patient/family education;Compensatory strategies;SLP instruction and feedback   Potential to Achieve Goals Good   Potential Considerations Cooperation/participation level  pain meds (lethargy)- today's visit   [place] provided today   Consulted and Agree with Plan of Care Patient      Patient will benefit from skilled therapeutic intervention in order to improve the following deficits and impairments:   Dysphagia    Problem List Patient Active Problem List   Diagnosis Date Noted  . S/P percutaneous endoscopic gastrostomy (PEG) tube placement ([place]) 04/07/2016  . Fever 03/18/2016  . Gagging episode 01/14/2016  . Protein-calorie malnutrition, mild ([place]) 12/25/2015  . 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 [place], [place], [address] Phone: [phone]   Fax:  [phone]   Name: Gregory Jenkins MRN: [id number] Date of Birth: [birth year]

## 2016-04-07 NOTE — Progress Notes (Signed)
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 Mild asymmetry of the left tonsil -- correlate with direct visualization to assess for primary neoplasm.   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   11/28/2015 PET scan 1. Left palatine tonsil primary with left worse than right cervical nodal metastasis. 2. No extracervical hypermetabolic metastasis.    12/12/2015 Procedure Peg and port-a-cath placement.   12/18/2015 - 01/29/2016 Chemotherapy Gregory Jenkins received weekly cetuximab   12/18/2015 - 02/03/2016 Radiation Therapy Received Helicle IMRT Tomotherapy:  Left tonsil and bilateral neck / 70 Gy in 35 fractions to gross disease, 63 Gy in 35 fractions to high risk nodal echelons, and 56 Gy in 35 fractions to intermediate risk nodal echelons.   12/31/2015 Adverse Reaction Treatment #3 is placed on hold due to worsening mucositis   01/08/2016 Miscellaneous Cetuximab #3 resumed with 50% dose adjustment   01/14/2016 Adverse Reaction Treatment #4 is placed on hold due to worsening mucositis and failure to thrive   03/20/2016 Imaging CT abdomen showed mild gallbladder wall thickening with pericholecystic fluid and several gallstones. The gallbladder is nondistended. Recommend clinical correlation for acute  or chronic cholecystitis.    INTERVAL HISTORY: Please see below for problem oriented charting. Gregory Jenkins returns for further follow-up. Gregory Jenkins is doing well. Gregory Jenkins is eating a lot of soft food by mouth Gregory Jenkins has no problems swallowing Gregory Jenkins denies mucositis pain Gregory Jenkins is no longer spitting phlegm No recent fever No recent abdominal pain or nausea No new lymphadenopathy Gregory Jenkins is gaining weight  REVIEW OF SYSTEMS:   Constitutional: Denies fevers, chills or abnormal weight loss Eyes: Denies blurriness of vision Ears, nose, mouth, throat, and face: Denies mucositis or sore throat Respiratory: Denies cough, dyspnea or wheezes Cardiovascular: Denies palpitation, chest discomfort or lower extremity swelling Gastrointestinal:  Denies nausea, heartburn or change in bowel habits Skin: Denies abnormal skin rashes Lymphatics: Denies new lymphadenopathy or easy bruising Neurological:Denies numbness, tingling or new weaknesses Behavioral/Psych: Mood is stable, no new changes  All other systems were reviewed with the patient and are negative.  I have reviewed the past medical history, past surgical history, social history and family history with the patient and they are unchanged from previous note.  ALLERGIES:  has No Known Allergies.  MEDICATIONS:  Current Outpatient Prescriptions  Medication Sig Dispense Refill  . aspirin EC 81 MG tablet Take 81 mg by mouth daily.    Marland Kitchen ibuprofen (ADVIL,MOTRIN) 200 MG tablet Take 200 mg by mouth as needed. Reported on 01/27/2016    . levothyroxine (SYNTHROID, LEVOTHROID) 75 MCG tablet Take 75 mcg by mouth daily before breakfast.    . lidocaine (XYLOCAINE) 2 % solution Mix 1 part 2%viscous lidocaine,1part H2O.Swish and/or swallow 47mL of this mixture,25min before meals and at bedtime, up to QID 100 mL 5  . Nutritional Supplements (FEEDING SUPPLEMENT, OSMOLITE 1.5 CAL,) LIQD Begin one can Osmolite 1.5,  or equivalent, QID with 30 cc free water before and 60 cc free water after. On day  3, increase to 1 1/2 cans BID and continue 2 cans BID as tolerated.On day 5, increase to 1 1/2 cans QID as tolerated.On day 7, increase to goal rate of 1 1/2 cans TID and two cans once for total of 6.5 cans daily.Flush tube or drink addition 32 oz water daily.Send TF and supplies. 1541 mL 4  . sodium fluoride (FLUORISHIELD) 1.1 % GEL dental gel Instill one drop of gel per tooth space of fluoride tray. Place over teeth for 5 minutes. Remove. Spit out excess. Repeat nightly. 120 mL PRN   No current facility-administered medications for this visit.    PHYSICAL EXAMINATION: ECOG PERFORMANCE STATUS: 0 - Asymptomatic  Filed Vitals:   04/07/16 1032  BP: 102/69  Pulse: 62  Temp: 97.7 F (36.5 C)  Resp: 18   Filed Weights   04/07/16 1032  Weight: 175 lb 8 oz (79.606 kg)    GENERAL:alert, no distress and comfortable SKIN: skin color, texture, turgor are normal, no rashes or significant lesions. Noted mild skin discoloration EYES: normal, Conjunctiva are pink and non-injected, sclera clear OROPHARYNX:no exudate, no erythema and lips, buccal mucosa, and tongue normal  NECK: supple, thyroid normal size, non-tender, without nodularity LYMPH:  no palpable lymphadenopathy in the cervical, axillary or inguinal LUNGS: clear to auscultation and percussion with normal breathing effort HEART: regular rate & rhythm and no murmurs and no lower extremity edema ABDOMEN:abdomen soft, non-tender and normal bowel sounds. Feeding tube looks okay Musculoskeletal:no cyanosis of digits and no clubbing  NEURO: alert & oriented x 3 with fluent speech, no focal motor/sensory deficits  LABORATORY DATA:  I have reviewed the data as listed    Component Value Date/Time   NA 136 03/18/2016 1603   NA 136 12/12/2015 1317   K 3.9 03/18/2016 1603   K 3.9 12/12/2015 1317   CL 103 12/12/2015 1317   CO2 21* 03/18/2016 1603   CO2 24 12/12/2015 1317   GLUCOSE 88 03/18/2016 1603   GLUCOSE 91 12/12/2015 1317   BUN 17.3  03/18/2016 1603   BUN 17 12/12/2015 1317   CREATININE 0.8 03/18/2016 1603   CREATININE 1.11 12/12/2015 1317   CALCIUM 8.1* 03/18/2016 1603   CALCIUM 9.0 12/12/2015 1317   PROT 6.3* 03/18/2016 1603   PROT 7.0 09/24/2009 1331   ALBUMIN 3.3* 03/18/2016 1603   ALBUMIN 4.1 09/24/2009 1331   AST 26 03/18/2016 1603   AST 18 09/24/2009 1331   ALT 35 03/18/2016 1603   ALT 40 09/24/2009 1331   ALKPHOS 86 03/18/2016 1603   ALKPHOS 123* 09/24/2009 1331   BILITOT 0.38 03/18/2016 1603   BILITOT 0.3 09/24/2009 1331   GFRNONAA >60 12/12/2015 1317   GFRAA >60 12/12/2015 1317    No results found for: SPEP, UPEP  Lab Results  Component Value Date   WBC 4.4 03/18/2016   NEUTROABS 3.6 03/18/2016   HGB 13.3 03/18/2016   HCT 39.5 03/18/2016   MCV 93.8 03/18/2016   PLT 78* 03/18/2016      Chemistry      Component Value Date/Time   NA 136 03/18/2016 1603   NA 136 12/12/2015 1317   K 3.9 03/18/2016 1603   K 3.9 12/12/2015 1317   CL 103 12/12/2015 1317   CO2 21* 03/18/2016 1603   CO2 24 12/12/2015 1317   BUN 17.3 03/18/2016 1603   BUN 17 12/12/2015 1317   CREATININE 0.8  03/18/2016 1603   CREATININE 1.11 12/12/2015 1317      Component Value Date/Time   CALCIUM 8.1* 03/18/2016 1603   CALCIUM 9.0 12/12/2015 1317   ALKPHOS 86 03/18/2016 1603   ALKPHOS 123* 09/24/2009 1331   AST 26 03/18/2016 1603   AST 18 09/24/2009 1331   ALT 35 03/18/2016 1603   ALT 40 09/24/2009 1331   BILITOT 0.38 03/18/2016 1603   BILITOT 0.3 09/24/2009 1331      ASSESSMENT & PLAN:  Tonsil cancer ([place]) Gregory Jenkins has almost recovered fully from side effects from treatment. Gregory Jenkins has appointment scheduled for PET/CT scan in August If PET/CT scan is negative, I will write orders for port removal I will transition his care to cancer survivorship clinic    S/P percutaneous endoscopic gastrostomy (PEG) tube placement [facility]) Gregory Jenkins is eating 80% of intake by mouth. Gregory Jenkins continues to use feeding tube and is gaining weight. I  recommend feeding tube removal in the new future. Gregory Jenkins will call the office when Gregory Jenkins is ready   Orders Placed This Encounter  Procedures  . Amb Referral to Survivorship Long term    Referral Priority:  Routine    Referral Type:  Consultation    Referred to Provider:  Holley Bouche, NP    Number of Visits Requested:  1   All questions were answered. The patient knows to call the clinic with any problems, questions or concerns. No barriers to learning was detected. I spent 15 minutes counseling the patient face to face. The total time spent in the appointment was 20 minutes and more than 50% was on counseling and review of test results     [facility], Julina Altmann, MD 04/07/2016 11:22 AM

## 2016-04-07 NOTE — Progress Notes (Signed)
Head & Neck Multidisciplinary Clinic Clinical Social Work  Clinical Social Work met with patient/family at head & neck multidisciplinary clinic to offer support and assess for psychosocial needs.  Mr. Carillo was accompanied by his spouse.  The patient has finished treatment and shared he is adequately recovering from his treatment.  CSW and patient/family discussed patient's overall experience with cancer treatment and how they are coping.  Clinical Social Work briefly discussed Clinical Social Work role and [employer] support programs/services.  Clinical Social Work encouraged patient to call with any additional questions or concerns.   Polo Riley, MSW, LCSW, OSW-C Clinical Social Worker [facility] [phone]

## 2016-04-10 ENCOUNTER — Other Ambulatory Visit: Payer: Self-pay | Admitting: Hematology and Oncology

## 2016-04-10 ENCOUNTER — Telehealth: Payer: Self-pay | Admitting: *Deleted

## 2016-04-10 DIAGNOSIS — Z931 Gastrostomy status: Secondary | ICD-10-CM

## 2016-04-10 NOTE — Telephone Encounter (Signed)
I placed order for PEG removal Rick, pls follow

## 2016-04-10 NOTE — Telephone Encounter (Signed)
Will do!

## 2016-04-10 NOTE — Telephone Encounter (Signed)
Patient's wife calling to request peg tube removal. Note to dr [employer] desk.

## 2016-04-15 ENCOUNTER — Ambulatory Visit (HOSPITAL_COMMUNITY)
Admission: RE | Admit: 2016-04-15 | Discharge: 2016-04-15 | Disposition: A | Discharge status: Home / Self Care | Payer: 59 | Source: Ambulatory Visit | Attending: Hematology and Oncology | Admitting: Hematology and Oncology

## 2016-04-15 DIAGNOSIS — Z931 Gastrostomy status: Secondary | ICD-10-CM

## 2016-04-15 DIAGNOSIS — Z85819 Personal history of malignant neoplasm of unspecified site of lip, oral cavity, and pharynx: Secondary | ICD-10-CM | POA: Insufficient documentation

## 2016-04-15 DIAGNOSIS — Z431 Encounter for attention to gastrostomy: Secondary | ICD-10-CM | POA: Insufficient documentation

## 2016-04-15 MED ORDER — LIDOCAINE VISCOUS 2 % MT SOLN
OROMUCOSAL | Status: AC
  Filled 2016-04-15: qty 15

## 2016-04-15 NOTE — Procedures (Signed)
31 French gastrostomy tube removed in its entirety without immediate complications. Medication used- viscous lidocaine into gastrostomy tube insertion site tract. Gauze dressing applied over site.

## 2016-05-11 ENCOUNTER — Ambulatory Visit: Payer: Self-pay

## 2016-05-11 ENCOUNTER — Ambulatory Visit: Payer: 59 | Attending: Radiation Oncology

## 2016-05-11 DIAGNOSIS — R1310 Dysphagia, unspecified: Secondary | ICD-10-CM | POA: Insufficient documentation

## 2016-05-13 ENCOUNTER — Telehealth: Payer: Self-pay | Admitting: *Deleted

## 2016-05-13 NOTE — Telephone Encounter (Signed)
  Oncology Nurse Navigator Documentation  Navigator Location: [facility] (05/13/16 1444) Navigator Encounter Type: Telephone (05/13/16 1444) Telephone: Outgoing Call (05/13/16 1444)                 Interventions: Coordination of Care (05/13/16 1444)       Spoke with Gregory Jenkins re attendance at next Tuesday morning's H&N MDC.   He agreed to attend, I provided him an arrival of 0800. He reported:  With exception of eggs, he is not eating solid food "because everything tastes bad".  We discussed how return of taste can be delayed for months following EOT.  (He completed weekly Cetuximab 01/29/16, Tomo IMRT 02/03/16.)    Drinking 6-8 cans of supplement daily.  He denied swallowing problems.  Staying active, energy level slowly returning. I emphasized importance of continuing monthly follow-ups with Nutrition and SLP for the foreseeable future.  He voiced understanding.  Gayleen Orem, RN, BSN, [place] at [place] [phone]                    Time Spent with Patient: 15 (05/13/16 1444)

## 2016-05-13 NOTE — Telephone Encounter (Signed)
  Oncology Nurse Navigator Documentation  Navigator Location: [facility] (05/13/16 1009) Navigator Encounter Type: Telephone (05/13/16 1009) Telephone: [place] Call (05/13/16 1009)                 Interventions: Coordination of Care (05/13/16 1009)       Called Gregory Jenkins to check on his availability to attend next Tuesday morning's H&N MDC to see Nutrition and SLP.  LVM requesting return call.  Gayleen Orem, RN, BSN, [place] at [place] [phone]                   Time Spent with Patient: 15 (05/13/16 1009)

## 2016-05-18 ENCOUNTER — Telehealth: Payer: Self-pay | Admitting: *Deleted

## 2016-05-18 NOTE — Telephone Encounter (Signed)
  Oncology Nurse Navigator Documentation  Navigator Location: [facility] (05/18/16 1456) Navigator Encounter Type: Telephone (05/18/16 1456) Telephone: [place] Call;Appt Confirmation/Clarification (05/18/16 1456)                 Interventions: Coordination of Care (05/18/16 1456)       Called Mr. Giglia to remind him of his 0800 arrival tomorrow morning for H&N MDC.  LVMM.  Gayleen Orem, RN, BSN, [place] at [place] [phone]                   Time Spent with Patient: 15 (05/18/16 1456)

## 2016-05-19 ENCOUNTER — Encounter: Payer: Self-pay | Admitting: *Deleted

## 2016-05-19 ENCOUNTER — Ambulatory Visit: Payer: 59

## 2016-05-19 ENCOUNTER — Ambulatory Visit: Payer: 59 | Admitting: Nutrition

## 2016-05-19 DIAGNOSIS — R1310 Dysphagia, unspecified: Secondary | ICD-10-CM | POA: Diagnosis not present

## 2016-05-19 NOTE — Progress Notes (Signed)
Patient was seen for nutrition follow-up, during Head and Neck clinic. Patient presents to clinic alone. Weight is stable and documented as 175 pounds. Patient reports he is drinking approximately 6 bottles of Ensure Plus equivalent daily. He drinks between 6 and 8 bottles of water every day. Patient is eating sporadically but has tolerated eggs, grits, ice tea, and Dr. Malachi Bonds Patient's main complaint is dry mouth and taste alterations.  Nutrition diagnosis: Inadequate oral intake improved.  Intervention:  Reviewed importance of patient beginning to increase oral intake starting with liquids and increasing to soft solid foods. Recommended patient keep a journal of foods he tolerates. Recommended patient attempt bites and sips of foods 3 times a day at mealtime. Patient should continue to drink Ensure Plus, 6 bottles daily for weight maintenance. Questions were answered and teach back method used.  Monitoring, evaluation, goals: Patient will tolerate increased oral intake in order to decrease oral nutrition supplements while promoting weight loss and healing.  Next visit: To be scheduled in 4- 6 weeks.  **Disclaimer: This note was dictated with voice recognition software. Similar sounding words can inadvertently be transcribed and this note may contain transcription errors which may not have been corrected upon publication of note.**

## 2016-05-19 NOTE — Patient Instructions (Signed)
Food journal can help your return to more consistent intake of regular foods.  Do your exercises at least twice a day. There is increased danger of muscle hardening if you do not.  Try different kinds of soups, have more of what you can tolerate, stay away from those you cannot.

## 2016-05-19 NOTE — Therapy (Signed)
[facility] [address] [place], [place], [address] Phone: [phone]   Fax:  [phone]  Speech Language Pathology Treatment  Patient Details  Name: Gregory Jenkins MRN: [id number] Date of Birth: 1950/12/10 Referring Provider: Eppie Gibson, M.D.  Encounter Date: 05/19/2016      End of Session - 05/19/16 1129    Visit Number 5   Number of Visits 7   Date for SLP Re-Evaluation 06/08/16   SLP Start Time 0830   SLP Stop Time  0910   SLP Time Calculation (min) 40 min      Past Medical History:  Diagnosis Date  . Abdominal pain 03/19/2016  . Allergic rhinitis   . Clotting disorder (HCC)    PE, suspect lupus anticoagulant  . Drug-induced skin rash 12/25/2015  . History of nephrolithiasis   . History of pulmonary embolism   . Hypertension   . Mucositis oral 01/08/2016  . Thyroid disease     Past Surgical History:  Procedure Laterality Date  . EYE SURGERY Right 1980's   H/O right orbital "blowout" fracture  . FOOT SURGERY Left    [place] Neuroma  . KNEE SURGERY Right    Arthroscopic    There were no vitals filed for this visit.      Subjective Assessment - 05/19/16 0846    Subjective "Everything tastes bitter."   Currently in Pain? No/denies               ADULT SLP TREATMENT - 05/19/16 0847      General Information   Behavior/Cognition Alert;Cooperative;Lethargic;Hard of hearing     Treatment Provided   Treatment provided Dysphagia     Dysphagia Treatment   Temperature Spikes Noted No   Treatment Methods Skilled observation;Therapeutic exercise   Patient observed directly with PO's Yes   Type of PO's observed Dysphagia 1 (puree);Thin liquids   Oral Phase Signs & Symptoms --  none noted   Pharyngeal Phase Signs & Symptoms --  none noted   Other treatment/comments SLP facilitated safe swallowing with pt today. He performed his HEP with independence. He told SLP why he was completing  HEP with independence. Pt admitted to frequency of HEP completion that had not changed since last visit. SLP reminded pt of the chance of muscle fibrosis incr'ing with lack of completion of HEP, and SLP provided suggestions of how to fit in HEP into pt's day, given that until October, SLP reminded pt, is the greatest chance of occurrence of muscle fibrosis. SLP handed pt s/s aspiration PNA and educated pt re: this. SLP facilitated a more variable PO intake from pt by suggesting food types that would be good at his stage of recovery, pt complained of dysgeusia as well as xerostomia hindering PO (solid food) intake. SLP further facilitated pt's PO safePO intake by suggesting a food journal and pt was able to tell SLP why this might be helpful in the future.     Assessment / Recommendations / Plan   Plan Continue with current plan of care     Dysphagia Recommendations   Diet recommendations Thin liquid  solids as tolerated; pt safe with Dys I item today   Liquids provided via Cup   Medication Administration --  liquid or puree     Progression Toward Goals   Progression toward goals Progressing toward goals          SLP Education - 05/19/16 0921    Education provided Yes   Education Details  s/s aspiration PNA, muscle fibrosis, HEP needs x2/day   Person(s) Educated Patient   Methods Explanation;Handout   Comprehension Verbalized understanding          SLP Short Term Goals - 04/07/16 1259      SLP SHORT TERM GOAL #1   Title pt will perform HEP with rare min A   Status Achieved     SLP SHORT TERM GOAL #2   Title pt will tell SLP why he is completing HEP   Status Not Met     SLP SHORT TERM GOAL #3   Title pt will tell SLP 3 s/s aspiration PNA with modified independence   Period --  visits   Status Achieved          SLP Long Term Goals - 05/19/16 1135      SLP LONG TERM GOAL #1   Title pt will perform HEP with modified independence over two sessions   Baseline to LTG 3    Time 1   Period --  visits   Status Deferred     SLP LONG TERM GOAL #2   Title pt will tell SLP how a food journal can expediate return to regular diet following chemo/rad tx over two sessions   Baseline one session 05-19-16   Time 1   Period --  visits   Status Revised     SLP LONG TERM GOAL #3   Title pt will maintain performance of HEP independently over three sessions   Baseline one session 05-19-16   Time 3   Period --  visits   Status On-going     SLP LONG TERM GOAL #4   Title pt will tell SLP date he modifies HEP frequency to 2-3x/week over two sessions   Time 4   Period --  visits   Status On-going          Plan - 05/19/16 1130    Clinical Impression Statement Pt presents today with very slow progression back onto PO diet, primarily due to reported xerostomia and dysgeusia, without overt s/s aspiration or aspiration PNA. Pt was independcnt with HEP however reports not copmleting >a few times per week. Pt would benefit from cont'd ST to assess HEP procedure and assess safety with POs.   Speech Therapy Frequency --  approx every four weeks   Duration --  2 visits   Treatment/Interventions Aspiration precaution training;Pharyngeal strengthening exercises;Compensatory techniques;Diet toleration management by SLP;Trials of upgraded texture/liquids;Patient/family education;Compensatory strategies;SLP instruction and feedback   Potential to Achieve Goals Good   Potential Considerations Cooperation/participation level  pain meds (lethargy)- today's visit   [place] provided today   Consulted and Agree with Plan of Care Patient      Patient will benefit from skilled therapeutic intervention in order to improve the following deficits and impairments:   Dysphagia    Problem List Patient Active Problem List   Diagnosis Date Noted  . S/P percutaneous endoscopic gastrostomy (PEG) tube placement ([place]) 04/07/2016  . Fever 03/18/2016  . Gagging episode  01/14/2016  . Protein-calorie malnutrition, mild ([place]) 12/25/2015  . Tonsil cancer ([place]) 11/25/2015    [facility] ,MS, [place]  05/19/2016, 11:38 AM  [facility] [address] [place], [place], [address] Phone: [phone]   Fax:  [phone]   Name: Gregory Jenkins MRN: [id number] Date of Birth: 1951-06-12

## 2016-05-21 NOTE — Progress Notes (Signed)
Head & Neck Multidisciplinary Clinic Clinical Social Work  Clinical Social Work met with patient at head & neck multidisciplinary clinic to offer support and assess for psychosocial needs.  CSW is familiar with patient and has met with him at previous [place].  Mr. Gregory Jenkins shared he has "good days and bad days".  The patient reports he has more good days than bad, on bad days he tends to "stay busy" to take his mind off "bad thoughts".  The patient continues to experience treatment related side effects and his mother is seriously ill.  Patient became tearful when discussing his mother's situation.  Patient's mother in law passed away during patient's radiation treatment, they plan to travel to [place] when he is "better" for [employer].  CSW provided brief emotional support and encouraged patient to express emotions surrounding his grief and many losses over the past year.   CSW normalized symptoms of depression during cancer treatment and encouraged patient to seek support through counseling and/or medication management.  [name] was appreciative of visit, but feels he is managing his emotions adequately.  Clinical Social Work encouraged patient to call with any additional questions or concerns.   Polo Riley, MSW, LCSW, OSW-C Clinical Social Worker [facility] [phone]

## 2016-05-21 NOTE — Progress Notes (Signed)
Gregory Jenkins presents for follow up of radiation completed 02/03/16 to his Left Tonsil and Bilateral Neck.   Pain issues, if any: He denies pain.  Using a feeding tube?: PEG removed 04/15/16 Weight changes, if any: Wt Readings from Last 3 Encounters:  05/27/16 174 lb 11.2 oz (79.2 kg)  05/19/16 175 lb (79.4 kg)  04/07/16 175 lb 8 oz (79.6 kg)    Swallowing issues, if any: He is swallowing 8 ensures a day. He has tried scrambled eggs, hamburger patty, chicken. He states all food tastes bitter. He has been trying to eat as directed by [facility] a little at each meal, but have ensure close by. He reports a dry mouth also prevents him from being able to swallow mashed potatoes, bread, and other items. He is drinking 5-6 sixteen ounces of water plus other liquids like tea, soda. Smoking or chewing tobacco? No Using fluoride trays daily? He uses them occasionally.  Last ENT visit was on: Dr. Redmond Baseman: not since diagnosis.  Other notable issues, if any:  He will see his primary care doctor in September for his annual physical.   Temp 98.4 F (36.9 C)   Ht 5' 11.5" (1.816 m)   Wt 174 lb 11.2 oz (79.2 kg)   SpO2 99% Comment: room air  BMI 24.03 kg/m    Orthostatics: BP sitting 90/54 pulse 66, BP standing 86/62, pulse 84 He was NPO from midnight Monday to noon on Tuesday for his PET scan.

## 2016-05-22 NOTE — Progress Notes (Signed)
  Oncology Nurse Navigator Documentation  Met with Gregory Jenkins during H&N [name].    Arrived him to Nursing, provided verbal and written overview of [name], the clinicians who will be seeing him, encouraged him to ask questions during their time with him.  He was seen by Nutrition, SLP and SW.  Spoke with him at end of [facility], he verbalized key learning points.   He understands I can be contacted with needs/concerns.  Gayleen Orem, RN, BSN, [place] at [facility] [phone]      Navigator Location: [facility] On[facility] (05/19/16 0835) Navigator Encounter Type: Clinic/MDC (05/19/16 0034)                                          Time Spent with Patient: 75 (05/19/16 0835)

## 2016-05-26 ENCOUNTER — Ambulatory Visit: Payer: 59

## 2016-05-26 ENCOUNTER — Encounter (HOSPITAL_COMMUNITY)
Admission: RE | Admit: 2016-05-26 | Discharge: 2016-05-26 | Disposition: A | Discharge status: Home / Self Care | Payer: 59 | Source: Ambulatory Visit | Attending: Radiation Oncology | Admitting: Radiation Oncology

## 2016-05-26 ENCOUNTER — Other Ambulatory Visit (HOSPITAL_BASED_OUTPATIENT_CLINIC_OR_DEPARTMENT_OTHER): Payer: 59

## 2016-05-26 ENCOUNTER — Ambulatory Visit
Admission: RE | Admit: 2016-05-26 | Discharge: 2016-05-26 | Disposition: A | Discharge status: Home / Self Care | Payer: 59 | Source: Ambulatory Visit | Attending: Radiation Oncology | Admitting: Radiation Oncology

## 2016-05-26 DIAGNOSIS — C099 Malignant neoplasm of tonsil, unspecified: Secondary | ICD-10-CM

## 2016-05-26 DIAGNOSIS — C090 Malignant neoplasm of tonsillar fossa: Secondary | ICD-10-CM

## 2016-05-26 DIAGNOSIS — K1230 Oral mucositis (ulcerative), unspecified: Secondary | ICD-10-CM

## 2016-05-26 DIAGNOSIS — R634 Abnormal weight loss: Secondary | ICD-10-CM

## 2016-05-26 DIAGNOSIS — Z79899 Other long term (current) drug therapy: Secondary | ICD-10-CM

## 2016-05-26 LAB — CBC WITH DIFFERENTIAL/PLATELET
BASO%: 0 % (ref 0.0–2.0)
Basophils Absolute: 0 10*3/uL (ref 0.0–0.1)
EOS%: 1 % (ref 0.0–7.0)
Eosinophils Absolute: 0 10*3/uL (ref 0.0–0.5)
HCT: 40.6 % (ref 38.4–49.9)
HGB: 13.9 g/dL (ref 13.0–17.1)
LYMPH#: 0.4 10*3/uL — AB (ref 0.9–3.3)
LYMPH%: 9.1 % — ABNORMAL LOW (ref 14.0–49.0)
MCH: 33 pg (ref 27.2–33.4)
MCHC: 34.2 g/dL (ref 32.0–36.0)
MCV: 96.4 fL (ref 79.3–98.0)
MONO#: 0.5 10*3/uL (ref 0.1–0.9)
MONO%: 11.6 % (ref 0.0–14.0)
NEUT%: 78.3 % — ABNORMAL HIGH (ref 39.0–75.0)
NEUTROS ABS: 3.2 10*3/uL (ref 1.5–6.5)
NRBC: 0 % (ref 0–0)
Platelets: 149 10*3/uL (ref 140–400)
RBC: 4.21 10*6/uL (ref 4.20–5.82)
RDW: 13.4 % (ref 11.0–14.6)
WBC: 4.1 10*3/uL (ref 4.0–10.3)

## 2016-05-26 LAB — COMPREHENSIVE METABOLIC PANEL
ALK PHOS: 81 U/L (ref 40–150)
ALT: 15 U/L (ref 0–55)
AST: 16 U/L (ref 5–34)
Albumin: 3.7 g/dL (ref 3.5–5.0)
Anion Gap: 6 mEq/L (ref 3–11)
BUN: 16.2 mg/dL (ref 7.0–26.0)
CHLORIDE: 104 meq/L (ref 98–109)
CO2: 28 mEq/L (ref 22–29)
Calcium: 9.9 mg/dL (ref 8.4–10.4)
Creatinine: 0.8 mg/dL (ref 0.7–1.3)
EGFR: >90 mL/min/{1.73_m2} (ref >90)
Glucose: 97 mg/dl (ref 70–140)
POTASSIUM: 4.5 meq/L (ref 3.5–5.1)
SODIUM: 139 meq/L (ref 136–145)
TOTAL PROTEIN: 7.2 g/dL (ref 6.4–8.3)
Total Bilirubin: 0.74 mg/dL (ref 0.20–1.20)

## 2016-05-26 LAB — TSH: TSH: 1.496 m(IU)/L (ref 0.320–4.118)

## 2016-05-26 LAB — GLUCOSE, CAPILLARY: GLUCOSE-CAPILLARY: 99 mg/dL (ref 65–99)

## 2016-05-26 MED ORDER — HEPARIN SOD (PORK) LOCK FLUSH 100 UNIT/ML IV SOLN
500.0000 [IU] | Freq: Once | INTRAVENOUS | Status: AC | PRN
  Administered 2016-05-26: 500 [IU]
  Filled 2016-05-26: qty 5

## 2016-05-26 MED ORDER — FLUDEOXYGLUCOSE F - 18 (FDG) INJECTION
9.8000 | Freq: Once | INTRAVENOUS | Status: AC | PRN
  Administered 2016-05-26: 9.8 via INTRAVENOUS

## 2016-05-26 MED ORDER — SODIUM CHLORIDE 0.9 % IJ SOLN
10.0000 mL | INTRAMUSCULAR | Status: DC | PRN
  Administered 2016-05-26: 10 mL
  Filled 2016-05-26: qty 10

## 2016-05-26 NOTE — Patient Instructions (Signed)

## 2016-05-27 ENCOUNTER — Encounter: Payer: Self-pay | Admitting: Radiation Oncology

## 2016-05-27 ENCOUNTER — Ambulatory Visit
Admission: RE | Admit: 2016-05-27 | Discharge: 2016-05-27 | Disposition: A | Discharge status: Home / Self Care | Payer: 59 | Source: Ambulatory Visit | Attending: Radiation Oncology | Admitting: Radiation Oncology

## 2016-05-27 DIAGNOSIS — R1310 Dysphagia, unspecified: Secondary | ICD-10-CM | POA: Insufficient documentation

## 2016-05-27 DIAGNOSIS — C099 Malignant neoplasm of tonsil, unspecified: Secondary | ICD-10-CM | POA: Insufficient documentation

## 2016-05-27 NOTE — Progress Notes (Addendum)
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     Narrative:  The patient returns today for routine follow-up. He is swallowing 8 ensures a day. He has tried scrambled eggs, hamburger patty, chicken. He states all food tastes bitter. He has been trying to eat as directed by [facility] a little at each meal, but have ensure close by. He reports a dry mouth also prevents him from being able to swallow mashed potatoes, bread, and other items. He is drinking 5-6 sixteen ounces of water plus other liquids like tea, soda. The patient has not seen his ENT since diagnosis.  He is scheduled to see his GP in September for his annual physical. Patient reports that some days there is a "film" sensation in his mouth that is accompanied by spitting thick mucous early in the morning.  He reports that over the past 3-4 nights he hasn't had to get up to spit.            ALLERGIES:  has No Known Allergies.  Meds: Current Outpatient Prescriptions  Medication Sig Dispense Refill  . aspirin EC 81 MG tablet Take 81 mg by mouth daily.    Marland Kitchen ibuprofen (ADVIL,MOTRIN) 200 MG tablet Take 200 mg by mouth as needed. Reported on 01/27/2016    . levothyroxine (SYNTHROID, LEVOTHROID) 75 MCG tablet Take 75 mcg by mouth daily before breakfast.    . sodium fluoride (FLUORISHIELD) 1.1 % GEL dental gel Instill one drop of gel per tooth space of fluoride tray. Place over teeth for 5 minutes. Remove. Spit out excess. Repeat nightly. 120 mL PRN  . lidocaine (XYLOCAINE) 2 % solution Mix 1 part 2%viscous lidocaine,1part H2O.Swish and/or swallow 25mL of this  mixture,14min before meals and at bedtime, up to QID (Patient not taking: Reported on 05/27/2016) 100 mL 5   No current facility-administered medications for this encounter.     Physical Findings: The patient is in no acute distress. Patient is alert and oriented. Wt Readings from Last 3 Encounters:  05/27/16 174 lb 11.2 oz (79.2 kg)  05/19/16 175 lb (79.4 kg)  04/07/16 175 lb 8 oz (79.6 kg)    height is 5' 11.5" (1.816 m) and weight is 174 lb 11.2 oz (79.2 kg). His temperature is 98.4 F (36.9 C). His oxygen saturation is 99%. .  General: Alert and oriented, in no acute distress HEENT: Head is normocephalic. Extraocular movements are intact. Oropharynx is notable for a little erythema but no sign of tumor nor thrush.  Neck: Neck is notable for no palpable supraclavicular or cervical lymphadenopathy. Skin: Skin in treatment fields shows satisfactory healing.  Patient has hyperpigmentation on his neck but some of this may be due to sun exposure.  Heart: Regular in rate and rhythm with no murmurs, rubs, or gallops. Chest: Clear to auscultation bilaterally, with no rhonchi, wheezes, or rales. Abdomen: Soft, nontender, nondistended, with no rigidity or guarding. Patient has right upper chest porta cath placed.  Lymphatics: see Neck Exam     Lab Findings: Lab Results  Component Value Date   WBC 4.1 05/26/2016   HGB 13.9 05/26/2016  HCT 40.6 05/26/2016   MCV 96.4 05/26/2016   PLT 149 05/26/2016    Lab Results  Component Value Date   TSH 1.496 05/26/2016    Radiographic Findings: Nm Pet Image Restag (ps) Skull Base To Thigh  Result Date: 05/26/2016 CLINICAL DATA:  Subsequent treatment strategy for tonsillar cancer. EXAM: NUCLEAR MEDICINE PET SKULL BASE TO THIGH TECHNIQUE: 9.8 mCi F-18 FDG was injected intravenously. Full-ring PET imaging was performed from the skull base to thigh after the radiotracer. CT data was obtained and used for attenuation correction and anatomic  localization. FASTING BLOOD GLUCOSE:  Value: 99 mg/dl COMPARISON:  11/28/2015. FINDINGS: NECK Marked interval response to therapy. The left palatini tonsil hypermetabolism seen previously has resolved in the interval and is symmetric on today's study. The left level 2-3 lymph node measured previously 2.2 cm with SUV max = 15.2 has decreased substantially in the interval, measuring 6 mm short axis (image 43 series 4) with SUV max = 3.2. An adjacent 4 mm level 2-3 left-sided lymph node measures 4 mm short axis with SUV max = 3. High left level 2 lymph node measuring 1.3 cm previously now measures 4 mm. This lesion was hypermetabolic previously but shows no FDG uptake above background soft tissue levels on the current study. 4 mm Right level 2-3 lymph node measured previously at 1.2 cm short axis and SUV max = 10.0 shows no hypermetabolism on today's exam with 4 mm short axis diameter currently (image 45 series 4). CHEST No hypermetabolic mediastinal or hilar nodes. No suspicious pulmonary nodules on the CT scan. Focal FDG accumulation is identified at the tip of the Port-A-Cath, likely representing in trapped and within some fibrin associated with the tip of the catheter. ABDOMEN/PELVIS No abnormal hypermetabolic activity within the liver, pancreas, adrenal glands, or spleen. No hypermetabolic lymph nodes in the abdomen or pelvis. Gallstones are seen in the lumen of the gallbladder. Stable appearance 2.7 cm cystic structure in the region of the common duct may represent choledochal cyst or potentially a GI duplication cyst. SKELETON No focal hypermetabolic activity to suggest skeletal metastasis. IMPRESSION: 1. Marked interval improvement with resolution of left tonsillar hypermetabolism and right cervical lymph node hypermetabolism. Left cervical lymph nodes have decreased substantially in size and there is a subtle degree of FDG hypermetabolism still associated with these left-sided cervical nodes. Electronically  Signed   By: Misty Stanley M.D.   On: 05/26/2016 12:39    Impression/Plan:    1) Head and Neck Cancer Status: No evidence of disease.  He has mild hypermetabolic activity in some left sided nodes that are no longer pathologically enlarged. Will present the patient at tumor board. I would consider him to be in remission, but as a precaution I will order another CT scan in 4 months at his next follow up.   2) Nutritional Status: Weight has remained stable, his PEG tube has been removed.   3) Risk Factors: The patient has been educated about risk factors including alcohol and tobacco abuse; they understand that avoidance of alcohol and tobacco is important to prevent recurrences as well as other cancers  4) Swallowing: Functional, but difficult with dry solid foods. I recommended he try using more sauces and gravies.   5) Dental: Encouraged to continue regular followup with dentistry, and dental hygiene including fluoride rinses.   6) Thyroid function:  Lab Results  Component Value Date   TSH 1.496 05/26/2016      8) Follow-up in 4 months. The patient was  encouraged to call with any issues or questions before then.  I have recommended therabreath lozenges to the patient for help during the day with his sx. I have also recommended a sunscreen of SPF 50 or greater for the patient to use while working outside. I also instructed the patients to call medical oncology in two weeks to ask whether or not the porta cath could be removed. I have also recommended the patient to continue following up with Dr. Redmond Baseman, with the first appointment being in 2 months.  .    _____________________________________   Eppie Gibson, MD   This document serves as a record of services personally performed by Eppie Gibson , MD. It was created on her behalf by Truddie Hidden, a trained medical scribe. The creation of this record is based on the scribe's personal observations and the provider's statements to them.  This document has been checked and approved by the attending provider.

## 2016-05-29 ENCOUNTER — Other Ambulatory Visit: Payer: Self-pay | Admitting: Radiation Oncology

## 2016-05-29 DIAGNOSIS — C090 Malignant neoplasm of tonsillar fossa: Secondary | ICD-10-CM

## 2016-06-01 ENCOUNTER — Telehealth: Payer: Self-pay | Admitting: *Deleted

## 2016-06-01 NOTE — Telephone Encounter (Signed)
  Oncology Nurse Navigator Documentation  Per Dr. Pearlie Oyster guidance, called [facility] ENT to arrange routine post-treatment follow-up visit.  Spoke with Joseph Art, requested that patient be contacted and appt arranged to see Dr. Redmond Baseman in 2 months.   She verbalized understanding.  Navigator Location: [facility] (06/01/16 1403) Navigator Encounter Type: Telephone (06/01/16 1403) Telephone: [place] Call (06/01/16 1403)                 Interventions: Coordination of Care (06/01/16 1403)                      Time Spent with Patient: 15 (06/01/16 1403)

## 2016-06-08 ENCOUNTER — Ambulatory Visit: Payer: Self-pay

## 2016-06-19 ENCOUNTER — Telehealth: Payer: Self-pay

## 2016-06-19 NOTE — Telephone Encounter (Signed)
Gregory Jenkins wife called asking about his porta cath and questioned if it could be removed. Dr. Isidore Moos has communicated with Dr. Alvy Bimler and she would like for it to remain in until the next scan confirms that all looks good, just as a precaution. I left this information as a voice mail for Gregory Jenkins and advised that they could call Dr. Alvy Bimler if they had any further questions regarding the portacath. I also left my phone number if they had any further questions for me.

## 2016-06-30 ENCOUNTER — Other Ambulatory Visit: Payer: Self-pay | Admitting: Hematology and Oncology

## 2016-07-03 ENCOUNTER — Telehealth: Payer: Self-pay | Admitting: Hematology and Oncology

## 2016-07-03 NOTE — Telephone Encounter (Signed)
lvm to inform pt of flush appt date/times per LOS

## 2016-07-21 ENCOUNTER — Telehealth: Payer: Self-pay | Admitting: *Deleted

## 2016-07-21 NOTE — Telephone Encounter (Signed)
Oncology Nurse Navigator Documentation  Returned call to patient's wife, provided clarification of CT scan to be scheduled prior to 12/8 appt with Dr. Isidore Moos.  Explained appt next week with ENT Dr. Redmond Baseman appropriate follow-up at this point in post-treatment.  She voiced understanding.  Gayleen Orem, RN, BSN, [place] at [place] [phone]

## 2016-07-27 ENCOUNTER — Ambulatory Visit (HOSPITAL_BASED_OUTPATIENT_CLINIC_OR_DEPARTMENT_OTHER): Payer: 59

## 2016-07-27 DIAGNOSIS — Z452 Encounter for adjustment and management of vascular access device: Secondary | ICD-10-CM

## 2016-07-27 DIAGNOSIS — Z95828 Presence of other vascular implants and grafts: Secondary | ICD-10-CM

## 2016-07-27 DIAGNOSIS — C099 Malignant neoplasm of tonsil, unspecified: Secondary | ICD-10-CM | POA: Diagnosis not present

## 2016-07-27 MED ORDER — HEPARIN SOD (PORK) LOCK FLUSH 100 UNIT/ML IV SOLN
500.0000 [IU] | Freq: Once | INTRAVENOUS | Status: AC
  Administered 2016-07-27: 500 [IU] via INTRAVENOUS
  Filled 2016-07-27: qty 5

## 2016-07-27 MED ORDER — SODIUM CHLORIDE 0.9% FLUSH
10.0000 mL | INTRAVENOUS | Status: DC | PRN
  Administered 2016-07-27: 10 mL via INTRAVENOUS
  Filled 2016-07-27: qty 10

## 2016-08-05 ENCOUNTER — Encounter: Payer: Self-pay | Admitting: *Deleted

## 2016-08-05 IMAGING — XA IR PERC PLACEMENT GASTROSTOMY
2 series · 12 of 12 positions shown · non-contrast
Comparison: None.

INDICATION: History of tonsillar cancer, in gastrostomy tube placement for
enteric nutrition supplementation and durable intravenous access for
chemotherapy administration.

EXAM:
1. IMPLANTED PORT A CATH PLACEMENT WITH ULTRASOUND AND FLUOROSCOPIC
GUIDANCE
2. FLUOROSCOPIC GUIDED GASTROSTOMY TUBE PLACEMENT

[Series 7: care single · 1 of 1 slices shown]
[im 1/1]
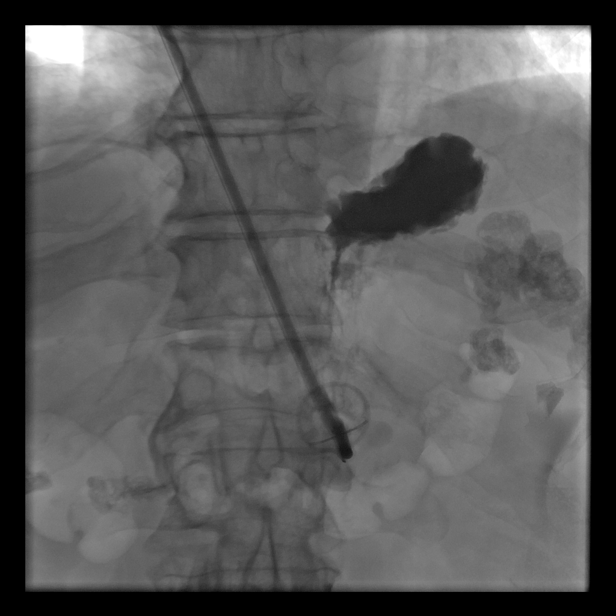

[Series 300: tube placements · 11 of 11 slices shown]
[im 1/11]
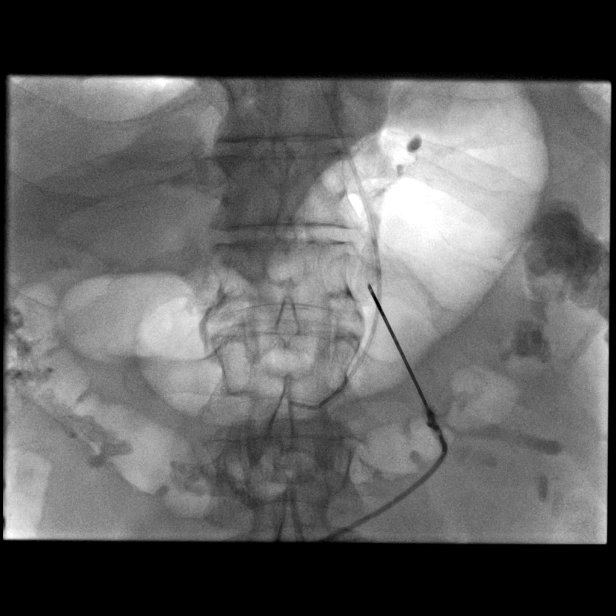
[im 2/11]
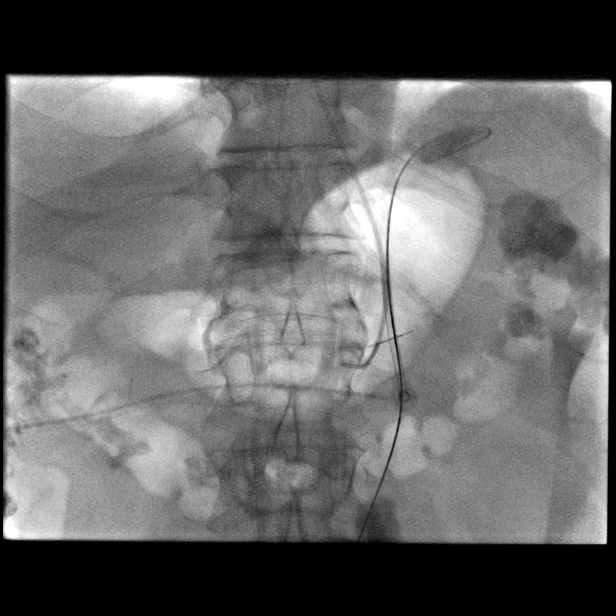
[im 3/11]
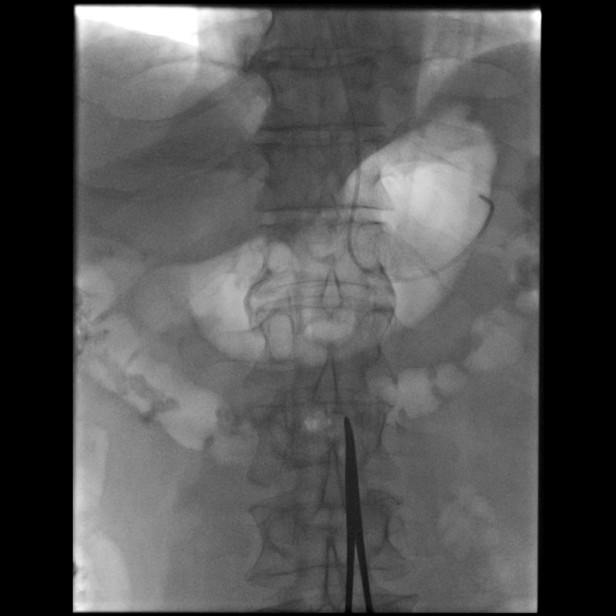
[im 4/11]
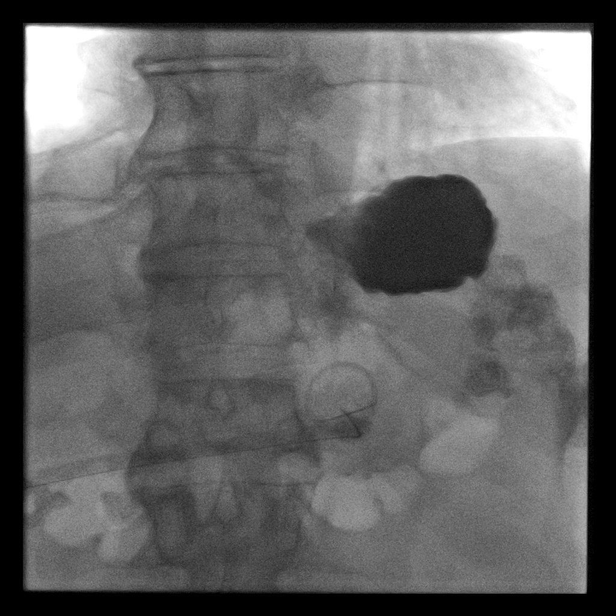
[im 5/11]
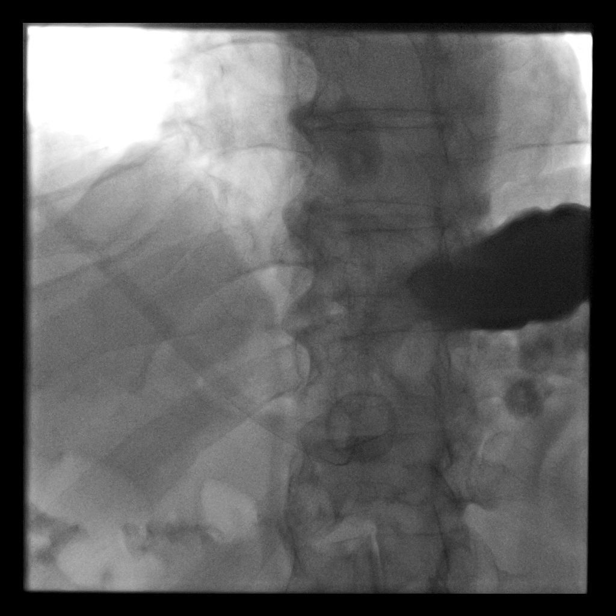
[im 6/11]
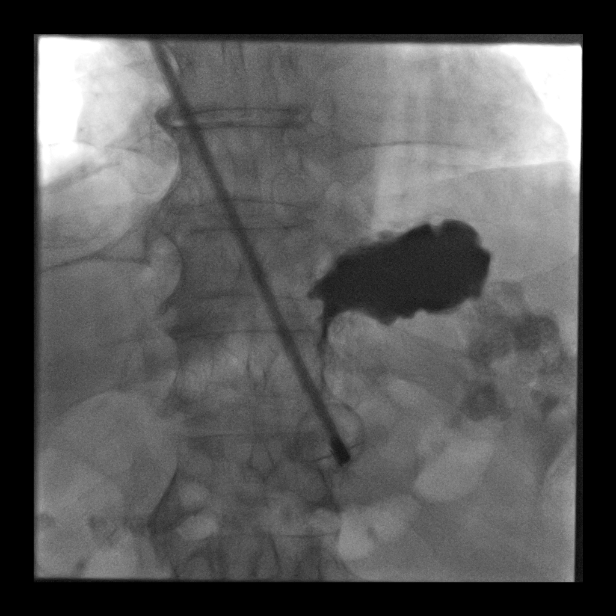
[im 7/11]
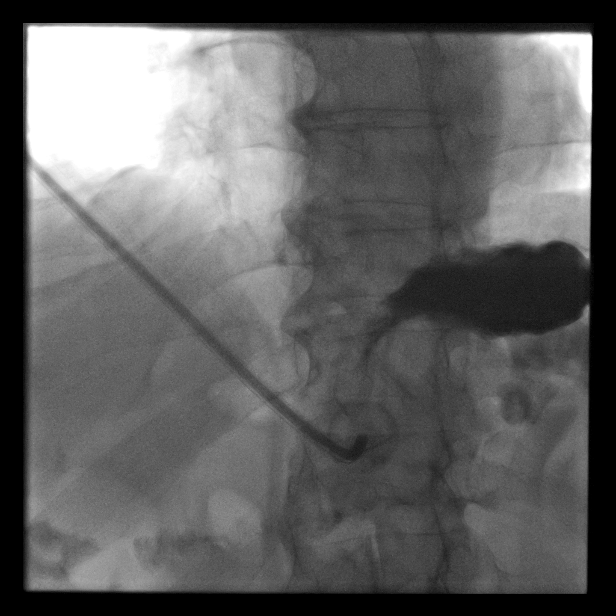
[im 8/11]
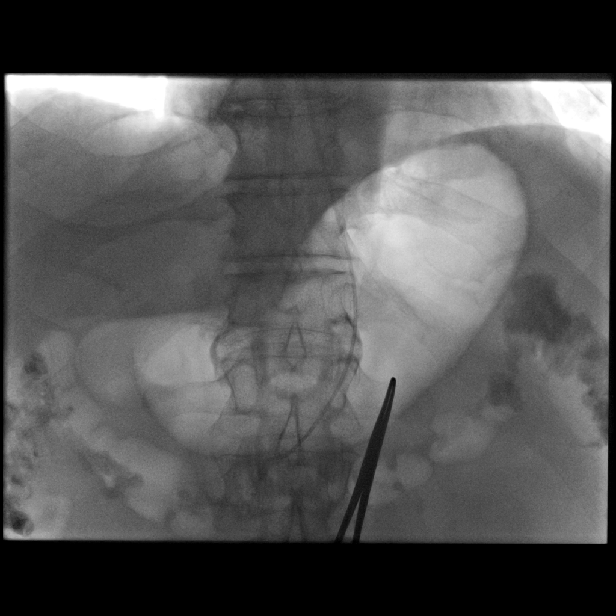
[im 9/11]
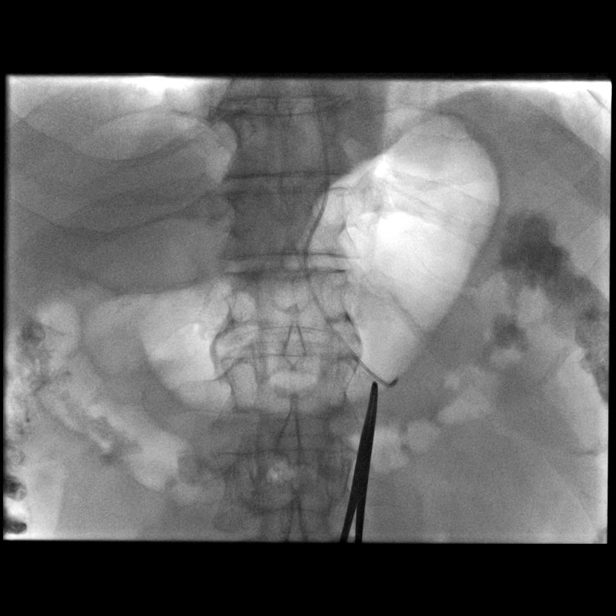
[im 10/11]
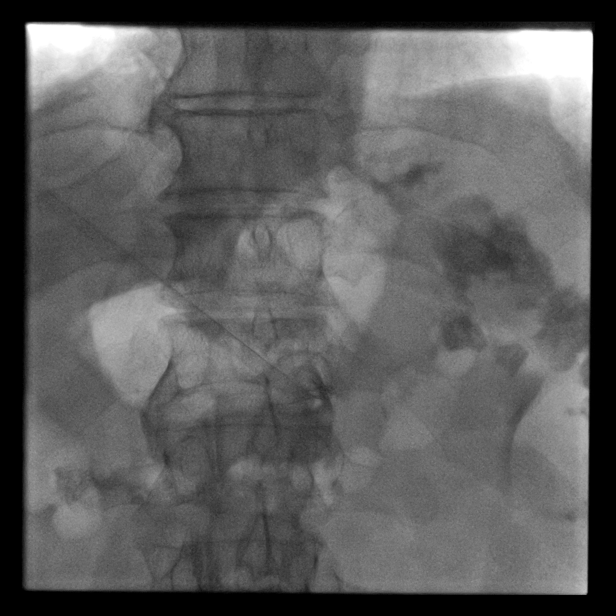
[im 11/11]
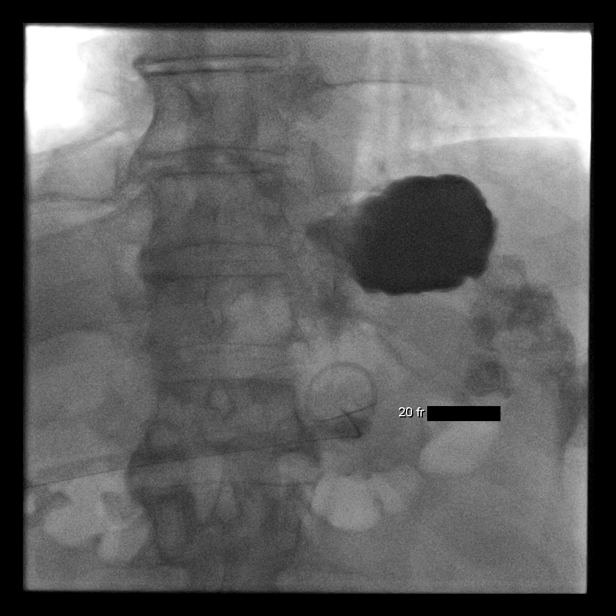

[12 of 12 positions shown; findings below may reference images not displayed]

MEDICATIONS:
Ancef 2 gm IV; The antibiotic was administered within an appropriate
time interval prior to skin puncture.

ANESTHESIA/SEDATION:
Moderate (conscious) sedation was employed during this procedure. A
total of Versed 3 mg and Fentanyl 100 mcg was administered
intravenously.

Moderate Sedation Time: 25 minutes. The patient's level of
consciousness and vital signs were monitored continuously by
radiology nursing throughout the procedure under my direct
supervision.

CONTRAST:  None

FLUOROSCOPY TIME:  12 seconds (3 mGy)

COMPLICATIONS:
None immediate.

PROCEDURE:
The procedure, risks, benefits, and alternatives were explained to
the patient. Questions regarding the procedure were encouraged and
answered. The patient understands and consents to the procedure.

The right neck and chest were prepped with chlorhexidine in a
sterile fashion, and a sterile drape was applied covering the
operative field. Maximum barrier sterile technique with sterile
gowns and gloves were used for the procedure. A timeout was
performed prior to the initiation of the procedure. Local anesthesia
was provided with 1% lidocaine with epinephrine.

After creating a small venotomy incision, a micropuncture kit was
utilized to access the internal jugular vein under direct, real-time
ultrasound guidance. Ultrasound image documentation was performed.
The microwire was kinked to measure appropriate catheter length.

A subcutaneous port pocket was then created along the upper chest
wall utilizing a combination of sharp and blunt dissection. The
pocket was irrigated with sterile saline. A single lumen ISP power
injectable port was chosen for placement. The 8 Fr catheter was
tunneled from the port pocket site to the venotomy incision. The
port was placed in the pocket. The external catheter was trimmed to
appropriate length. At the venotomy, an 8 Fr peel-away sheath was
placed over a guidewire under fluoroscopic guidance. The catheter
was then placed through the sheath and the sheath was removed. Final
catheter positioning was confirmed and documented with a
fluoroscopic spot radiograph. The port was accessed with Jimbo Kanaan
needle, aspirated and flushed with heparinized saline.

The venotomy site was closed with an interrupted 4-0 Vicryl suture.
The port pocket incision was closed with interrupted 2-0 Vicryl
suture and the skin was opposed with a running subcuticular 4-0
Vicryl suture. Dermabond and [place] were applied to both
incisions. Dressings were placed. The patient tolerated the
procedure well without immediate post procedural complication.

----------------------------------------------------------------

Attention was now paid towards the gastrostomy tube placement.
Ultrasound scanning was performed to demarcate the edge of the left
lobe of the liver. Maximal barrier sterile technique utilized
including caps, mask, sterile gowns, sterile gloves, large sterile
drape, hand hygiene and Betadine prep.

The left upper quadrant was sterilely prepped and draped. An oral
gastric catheter was inserted into the stomach under fluoroscopy.
The existing nasogastric feeding tube was removed. The left costal
margin and air opacified transverse colon were identified and
avoided. Air was injected into the stomach for insufflation and
visualization under fluoroscopy. Under sterile conditions a 17 gauge
trocar needle was utilized to access the stomach percutaneously
beneath the left subcostal margin after the overlying soft tissues
were anesthetized with 1% Lidocaine with epinephrine. Needle
position was confirmed within the stomach with aspiration of air and
injection of small amount of contrast. A single T tack was deployed
for gastropexy. Over an Amplatz guide wire, a 9-French sheath was
inserted into the stomach. A snare device was utilized to capture
the oral gastric catheter. The snare device was pulled retrograde
from the stomach up the esophagus and out the oropharynx. The
20-French pull-through gastrostomy was connected to the snare device
and pulled antegrade through the oropharynx down the esophagus into
the stomach and then through the percutaneous tract external to the
patient. The gastrostomy was assembled externally. Contrast
injection confirms position in the stomach. Several spot
radiographic images were obtained in various obliquities for
documentation. The patient tolerated procedure well without
immediate post procedural complication.
FINDINGS: After catheter placement, the tip lies within the superior
cavoatrial junction. The catheter aspirates and flushes normally and
is ready for immediate use.

After successful fluoroscopic guided placement, the gastrostomy tube
is appropriately positioned with internal disc against the ventral
aspect of the gastric lumen.
IMPRESSION: 1. Successful placement of a right internal jugular approach power
injectable Port-A-Cath. The catheter is ready for immediate use.
2. Successful fluoroscopic insertion of a 20-French pull-through
gastrostomy tube. The gastrostomy may be used immediately for
medication administration and in 24 hrs for the initiation of feeds.

## 2016-08-05 NOTE — Progress Notes (Signed)
Oncology Nurse Navigator Documentation  Mr. Lardieri and his wife attended the Fall 2017 [facility] survivorship program (5 consecutive Tuesday evening sessions, 6:00-7:15 pm, [facility], beginning 07/07/16).  Gayleen Orem, RN, BSN, [place] at [place] [phone]

## 2016-09-15 ENCOUNTER — Telehealth: Payer: Self-pay | Admitting: *Deleted

## 2016-09-15 NOTE — Telephone Encounter (Signed)
Called patient to inform of Ct on 10/01/16 - arrival time - 11:15 am @ [facility] Radiology, pt. to be npo 4 hrs. Prior to test, lvm for a return call

## 2016-09-18 ENCOUNTER — Ambulatory Visit (HOSPITAL_BASED_OUTPATIENT_CLINIC_OR_DEPARTMENT_OTHER): Payer: 59

## 2016-09-18 DIAGNOSIS — Z452 Encounter for adjustment and management of vascular access device: Secondary | ICD-10-CM | POA: Diagnosis not present

## 2016-09-18 DIAGNOSIS — K1230 Oral mucositis (ulcerative), unspecified: Secondary | ICD-10-CM

## 2016-09-18 DIAGNOSIS — C090 Malignant neoplasm of tonsillar fossa: Secondary | ICD-10-CM | POA: Diagnosis not present

## 2016-09-18 MED ORDER — SODIUM CHLORIDE 0.9 % IJ SOLN
10.0000 mL | INTRAMUSCULAR | Status: DC | PRN
  Administered 2016-09-18: 10 mL
  Filled 2016-09-18: qty 10

## 2016-09-18 MED ORDER — HEPARIN SOD (PORK) LOCK FLUSH 100 UNIT/ML IV SOLN
500.0000 [IU] | Freq: Once | INTRAVENOUS | Status: AC | PRN
  Administered 2016-09-18: 500 [IU]
  Filled 2016-09-18: qty 5

## 2016-09-29 NOTE — Progress Notes (Signed)
  Gregory Jenkins presents for follow up of radiation completed 02/03/16 to his Left Tonsil and Bilateral Neck.   Pain issues, if any: He denies Using a feeding tube?: Removed Weight changes, if any:  Wt Readings from Last 3 Encounters:  10/02/16 171 lb 3.2 oz (77.7 kg)  05/27/16 174 lb 11.2 oz (79.2 kg)  05/19/16 175 lb (79.4 kg)   Swallowing issues, if any: He takes a long time to eat. He needs to chew his food well and moisten it. He reports continued taste changes. He reports a decreased appetite, and "just doesn't care to eat anymore". He denies ever feeling hungry.  He is drinking 6-8 Ensures daily to supplement his intake.  Smoking or chewing tobacco? No Using fluoride trays daily? He is not using, but plans to start.  Last ENT visit was on: Dr. Redmond Baseman 07/27/16. He plans to schedule another appointment soon.  Other notable issues, if any:  CT Neck 10/01/16  Temp 97.7 F (36.5 C)   Ht 5' 11.5" (1.816 m)   Wt 171 lb 3.2 oz (77.7 kg)   SpO2 96% Comment: room air  BMI 23.54 kg/m    Orthostatics: BP sitting 104/61, pulse 67, BP standing 104/70, pulse 82

## 2016-09-30 ENCOUNTER — Encounter: Payer: Self-pay | Admitting: Radiation Oncology

## 2016-10-01 ENCOUNTER — Ambulatory Visit
Admission: RE | Admit: 2016-10-01 | Discharge: 2016-10-01 | Disposition: A | Discharge status: Home / Self Care | Payer: 59 | Source: Ambulatory Visit | Attending: Radiation Oncology | Admitting: Radiation Oncology

## 2016-10-01 ENCOUNTER — Ambulatory Visit (HOSPITAL_COMMUNITY)
Admission: RE | Admit: 2016-10-01 | Discharge: 2016-10-01 | Disposition: A | Discharge status: Home / Self Care | Payer: 59 | Source: Ambulatory Visit | Attending: Radiation Oncology | Admitting: Radiation Oncology

## 2016-10-01 DIAGNOSIS — C090 Malignant neoplasm of tonsillar fossa: Secondary | ICD-10-CM | POA: Diagnosis not present

## 2016-10-01 LAB — BUN AND CREATININE (CC13)
BUN: 18.8 mg/dL (ref 7.0–26.0)
CREATININE: 1 mg/dL (ref 0.7–1.3)
EGFR: 81 mL/min/{1.73_m2} — ABNORMAL LOW (ref >90)

## 2016-10-01 MED ORDER — IOPAMIDOL (ISOVUE-300) INJECTION 61%: 75.0000 mL | Freq: Once | INTRAVENOUS | Status: DC | PRN

## 2016-10-02 ENCOUNTER — Encounter: Payer: Self-pay | Admitting: *Deleted

## 2016-10-02 ENCOUNTER — Ambulatory Visit
Admission: RE | Admit: 2016-10-02 | Discharge: 2016-10-02 | Disposition: A | Discharge status: Home / Self Care | Payer: 59 | Source: Ambulatory Visit | Attending: Radiation Oncology | Admitting: Radiation Oncology

## 2016-10-02 ENCOUNTER — Other Ambulatory Visit: Payer: Self-pay | Admitting: Hematology and Oncology

## 2016-10-02 ENCOUNTER — Encounter: Payer: Self-pay | Admitting: Radiation Oncology

## 2016-10-02 DIAGNOSIS — Z923 Personal history of irradiation: Secondary | ICD-10-CM | POA: Insufficient documentation

## 2016-10-02 DIAGNOSIS — Z7982 Long term (current) use of aspirin: Secondary | ICD-10-CM | POA: Diagnosis not present

## 2016-10-02 DIAGNOSIS — C090 Malignant neoplasm of tonsillar fossa: Secondary | ICD-10-CM

## 2016-10-02 DIAGNOSIS — Z79899 Other long term (current) drug therapy: Secondary | ICD-10-CM | POA: Diagnosis not present

## 2016-10-02 DIAGNOSIS — Z85818 Personal history of malignant neoplasm of other sites of lip, oral cavity, and pharynx: Secondary | ICD-10-CM | POA: Insufficient documentation

## 2016-10-02 DIAGNOSIS — Z5189 Encounter for other specified aftercare: Secondary | ICD-10-CM | POA: Insufficient documentation

## 2016-10-02 HISTORY — DX: Personal history of irradiation: Z92.3

## 2016-10-02 NOTE — Progress Notes (Addendum)
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    CHIEF COMPLAINT: I am here to hear about my scan  Narrative:  The patient returns today for routine follow-up.   The patient denies pain. His feeding tube has been removed. He reports taking a long time to eat. He reports needing to chew his food well and moisten it.  Very Dry mouth. He reports continued taste changes. Additionally, the patient reports he decreased appetite, and "just doesn't care to eat anymore." He denies ever feeling hungry. The patient denies smoking or chewing tobacco. He is not using daily fluoride trays, but plans to start. His last ENT visit was with Dr. Redmond Baseman on 07/27/16; he reports planning to schedule another appointment soon. He is accompanied to the clinic today by his wife.  The patient had a neck CT on 10/01/16, showing no evidence of  progression or recurrence.         ALLERGIES:  has No Known Allergies.  Meds: Current Outpatient Prescriptions  Medication Sig Dispense Refill  . aspirin EC 81 MG tablet Take 81 mg by mouth daily.    Marland Kitchen ibuprofen (ADVIL,MOTRIN) 200 MG tablet Take 200 mg by mouth as needed. Reported on 01/27/2016    . levothyroxine (SYNTHROID, LEVOTHROID) 75 MCG tablet Take 75 mcg by mouth daily before breakfast.    . lidocaine (XYLOCAINE) 2 % solution Mix 1 part 2%viscous lidocaine,1part H2O.Swish and/or swallow 22mL of this mixture,35min before meals and at bedtime, up to QID (Patient not taking: Reported on 10/02/2016) 100 mL 5  . sodium fluoride (FLUORISHIELD) 1.1 % GEL dental gel Instill one drop of gel per tooth  space of fluoride tray. Place over teeth for 5 minutes. Remove. Spit out excess. Repeat nightly. (Patient not taking: Reported on 10/02/2016) 120 mL PRN   No current facility-administered medications for this encounter.     Physical Findings: The patient is in no acute distress. Patient is alert and oriented. Wt Readings from Last 3 Encounters:  10/02/16 171 lb 3.2 oz (77.7 kg)  05/27/16 174 lb 11.2 oz (79.2 kg)  05/19/16 175 lb (79.4 kg)    height is 5' 11.5" (1.816 m) and weight is 171 lb 3.2 oz (77.7 kg). His temperature is 97.7 F (36.5 C). His oxygen saturation is 96%.  General: Alert and oriented, in no acute distress. HEENT: Oral cavity is a little dry, some salivary production. No evidence of thrush, and no oral lesions noted. Neck: Neck is supple, no palpable cervical or supraclavicular lymphadenopathy or masses. Some fibrosis in the anterior neck noted. Heart: Regular in rate and rhythm with no murmurs. Chest: Clear to auscultation bilaterally, with no rhonchi, wheezes, or rales. Abdomen: Soft, nontender, nondistended, with no rigidity or guarding. Extremities: No edema. Lymphatics: see Neck Exam Skin: No concerning lesions. The skin over his neck has healed very well. Neurologic: No obvious focalities. Speech is fluent. Coordination is intact. Psychiatric: Judgment and insight are intact. Affect is appropriate.  Lab Findings: Lab Results  Component Value Date   WBC 4.1 05/26/2016   HGB 13.9 05/26/2016   HCT 40.6 05/26/2016  MCV 96.4 05/26/2016   PLT 149 05/26/2016    Lab Results  Component Value Date   TSH 1.496 05/26/2016    Radiographic Findings: I personally reviewed the images Ct Soft Tissue Neck W Contrast  Result Date: 10/01/2016 CLINICAL DATA:  Tonsil cancer diagnosed February 2017 with chemo radiation finished April 2017. Mild hyper metabolism in left cervical lymph nodes on PET-CT 05/26/2016 EXAM: CT NECK WITH CONTRAST TECHNIQUE: Multidetector CT  imaging of the neck was performed using the standard protocol following the bolus administration of intravenous contrast. CONTRAST:  75 cc Isovue 300 intravenous COMPARISON:  11/18/2015 FINDINGS: Pharynx and larynx: Symmetric submucosal edematous appearance consistent with treatment change. No asymmetric enhancement or mass effect in the tonsillar fossae. Salivary glands: Expected post treatment changes. No primary disease. Thyroid: Negative Lymph nodes: Bilateral cervical chain adenopathy has normalized in size since neck CT comparison. Dystrophic appearing calcifications present in 2 left upper cervical chain lymph nodes. No visible necrosis within cervical lymph nodes. A visible right lateral retropharyngeal node is within normal limits in size and stable from previous PET-CT. The nodes with mild hypermetabolism on the previous PET-CT are difficult to compare to prior given differences in technique, but these nodes appear to have further regressed in size and are more flat in shape. A measured left-sided node at the level of the glottis previously recorded at 6 mm, today in measurea at 5 mm. Left upper cervical chain node with dystrophic calcification was previously measured at 4 mm, today 3 mm short axis. No newly enlarged lymph node. Vascular: Prominent noncalcified plaque in the cervical carotids and proximal left subclavian artery. Limited intracranial: Negative Visualized orbits: Negative Mastoids and visualized paranasal sinuses: Mild chronic mucosal thickening. No acute finding. Skeleton: No acute or aggressive finding. Degenerative changes with notable less C3-4 and C4-5 foraminal stenosis. Upper chest: No visualized nodules. IMPRESSION: No evidence of new or residual disease in the neck. Mildly hypermetabolic left cervical lymph nodes on PET-CT 05/26/2016 have regressed in size since that study. Electronically Signed   By: Monte Fantasia M.D.   On: 10/01/2016 14:19   Impression/Plan:    1) Head and  Neck Cancer Status: No evidence of disease recurrence.  2) Nutritional Status: Some weight loss noted, his PEG tube has been removed. I recommended the patient try lubricating his mouth with a bit of cannola oil when eating to add more calories to his diet while lubricating food.  3) Risk Factors: The patient has been educated about risk factors including alcohol and tobacco abuse; they understand that avoidance of alcohol and tobacco is important to prevent recurrences as well as other cancers.  Abstaining   4) Swallowing: Functional, but difficult with dry solid foods. I recommended he try using Biotene gel/mouthwash or ACT dry mouth mouthwash.  5) Dental: Encouraged to continue regular followup with dentistry, and dental hygiene including fluoride rinses.   6) Thyroid function: labs WNL, on supplement.  Continue to monitor TSH q 6-12 mo here or at PCP's. Lab Results  Component Value Date   TSH 1.496 05/26/2016    7) Other: At home, I recommend the patient massage downward on his neck lightly, which could loosen the tissues and relieve some of his discomfort.  Gayleen Orem, RN, our Head and Neck Oncology Navigator will reach out to Dr. Alvy Bimler to discuss potential removal of the patient's port. He will update the patient as indicated.  8) I recommend the patient schedule an appointment with Dr. Redmond Baseman in 3 months. The  patient should see Mike Craze in approximately 6 months. After the patient sees Elzie Rings in June, he should see Dr. Redmond Baseman again in October. Following that appointment the patient should schedule to follow up with me in Radiation Oncology four months later.   At this time there is no call for additional scans unless something should change. If the patient has any concerns before our next visit, I encouraged him to contact me for follow up or to schedule additional scans.  _____________________________________   Eppie Gibson, MD  This document serves as a record of  services personally performed by Eppie Gibson, MD. It was created on her behalf by Maryla Morrow, a trained medical scribe. The creation of this record is based on the scribe's personal observations and the provider's statements to them. This document has been checked and approved by the attending provider.

## 2016-10-02 NOTE — Progress Notes (Signed)
Oncology Nurse Navigator Documentation  Met with Gregory Jenkins and his wife during follow-up with Dr. Isidore Moos. They voiced understanding of favorable results of 10/01/16 CT, expressed relief. Reported:  Lack of appetite, desire to eat, some return of taste buds.    Drinking 6-7 cans supplement daily to maintain weight.  Activity level has returned to baseline.  Had appt with Dr. Redmond Baseman in October, to see him again in March per Dr. Isidore Moos. They denied navigation needs, understand I can be contacted.  Gayleen Orem, RN, BSN, [place] Neck Oncology Nurse [place] at [place] [phone]

## 2016-10-07 ENCOUNTER — Telehealth: Payer: Self-pay | Admitting: *Deleted

## 2016-10-07 NOTE — Telephone Encounter (Signed)
Oncology Nurse Navigator Documentation  Received call from patient's wife with questions regarding sedation for [facility] removal scheduled for 12/18.  I provided her phone # for [facility] IR.  Gayleen Orem, RN, BSN, [place] Neck Oncology Nurse [place] at [place] [phone]

## 2016-10-09 ENCOUNTER — Other Ambulatory Visit: Payer: Self-pay | Admitting: Student

## 2016-10-09 ENCOUNTER — Other Ambulatory Visit: Payer: Self-pay | Admitting: Radiology

## 2016-10-12 ENCOUNTER — Ambulatory Visit (HOSPITAL_COMMUNITY)
Admission: RE | Admit: 2016-10-12 | Discharge: 2016-10-12 | Disposition: A | Discharge status: Home / Self Care | Payer: 59 | Source: Ambulatory Visit | Attending: Hematology and Oncology | Admitting: Hematology and Oncology

## 2016-10-12 ENCOUNTER — Encounter (HOSPITAL_COMMUNITY): Payer: Self-pay

## 2016-10-12 DIAGNOSIS — I10 Essential (primary) hypertension: Secondary | ICD-10-CM | POA: Insufficient documentation

## 2016-10-12 DIAGNOSIS — Z87442 Personal history of urinary calculi: Secondary | ICD-10-CM | POA: Insufficient documentation

## 2016-10-12 DIAGNOSIS — E079 Disorder of thyroid, unspecified: Secondary | ICD-10-CM | POA: Insufficient documentation

## 2016-10-12 DIAGNOSIS — Z9221 Personal history of antineoplastic chemotherapy: Secondary | ICD-10-CM | POA: Diagnosis not present

## 2016-10-12 DIAGNOSIS — J309 Allergic rhinitis, unspecified: Secondary | ICD-10-CM | POA: Diagnosis not present

## 2016-10-12 DIAGNOSIS — Z452 Encounter for adjustment and management of vascular access device: Secondary | ICD-10-CM | POA: Diagnosis not present

## 2016-10-12 DIAGNOSIS — Z7982 Long term (current) use of aspirin: Secondary | ICD-10-CM | POA: Diagnosis not present

## 2016-10-12 DIAGNOSIS — Z86711 Personal history of pulmonary embolism: Secondary | ICD-10-CM | POA: Insufficient documentation

## 2016-10-12 DIAGNOSIS — Z923 Personal history of irradiation: Secondary | ICD-10-CM | POA: Insufficient documentation

## 2016-10-12 DIAGNOSIS — C090 Malignant neoplasm of tonsillar fossa: Secondary | ICD-10-CM

## 2016-10-12 DIAGNOSIS — Z85810 Personal history of malignant neoplasm of tongue: Secondary | ICD-10-CM | POA: Insufficient documentation

## 2016-10-12 HISTORY — PX: IR GENERIC HISTORICAL: IMG1180011

## 2016-10-12 LAB — BASIC METABOLIC PANEL
Anion gap: 5 (ref 5–15)
BUN: 19 mg/dL (ref 6–20)
CHLORIDE: 104 mmol/L (ref 101–111)
CO2: 31 mmol/L (ref 22–32)
CREATININE: 0.94 mg/dL (ref 0.61–1.24)
Calcium: 9.1 mg/dL (ref 8.9–10.3)
GFR calc Af Amer: >60 mL/min (ref >60)
GFR calc non Af Amer: >60 mL/min (ref >60)
GLUCOSE: 106 mg/dL — AB (ref 65–99)
Potassium: 4.6 mmol/L (ref 3.5–5.1)
SODIUM: 140 mmol/L (ref 135–145)

## 2016-10-12 LAB — CBC
HEMATOCRIT: 43.3 % (ref 39.0–52.0)
Hemoglobin: 14.7 g/dL (ref 13.0–17.0)
MCH: 33 pg (ref 26.0–34.0)
MCHC: 33.9 g/dL (ref 30.0–36.0)
MCV: 97.3 fL (ref 78.0–100.0)
PLATELETS: 157 10*3/uL (ref 150–400)
RBC: 4.45 MIL/uL (ref 4.22–5.81)
RDW: 13 % (ref 11.5–15.5)
WBC: 7.4 10*3/uL (ref 4.0–10.5)

## 2016-10-12 LAB — APTT: aPTT: 24 seconds (ref 24–36)

## 2016-10-12 LAB — PROTIME-INR
INR: 0.95
Prothrombin Time: 12.7 seconds (ref 11.4–15.2)

## 2016-10-12 MED ORDER — FENTANYL CITRATE (PF) 100 MCG/2ML IJ SOLN
INTRAMUSCULAR | Status: AC
  Filled 2016-10-12: qty 4

## 2016-10-12 MED ORDER — SODIUM CHLORIDE 0.9 % IV SOLN: INTRAVENOUS | Status: DC

## 2016-10-12 MED ORDER — FENTANYL CITRATE (PF) 100 MCG/2ML IJ SOLN
INTRAMUSCULAR | Status: AC | PRN
  Administered 2016-10-12: 50 ug via INTRAVENOUS

## 2016-10-12 MED ORDER — MIDAZOLAM HCL 2 MG/2ML IJ SOLN
INTRAMUSCULAR | Status: AC
  Filled 2016-10-12: qty 6

## 2016-10-12 MED ORDER — MIDAZOLAM HCL 2 MG/2ML IJ SOLN
INTRAMUSCULAR | Status: AC | PRN
  Administered 2016-10-12 (×3): 1 mg via INTRAVENOUS

## 2016-10-12 MED ORDER — CEFAZOLIN SODIUM-DEXTROSE 2-4 GM/100ML-% IV SOLN
2.0000 g | INTRAVENOUS | Status: AC
  Administered 2016-10-12: 2 g via INTRAVENOUS
  Filled 2016-10-12: qty 100

## 2016-10-12 MED ORDER — LIDOCAINE HCL 1 % IJ SOLN
INTRAMUSCULAR | Status: AC
  Filled 2016-10-12: qty 20

## 2016-10-12 NOTE — H&P (Signed)
Referring Physician(s): Heath Lark  Supervising Physician: Corrie Mckusick  Patient Status:  [facility] OP  Chief Complaint:  "I'm getting my port out"  Subjective: Pt familiar to IR service from prior gastrostomy tube and Port-A-Cath placements on 12/12/15. He has a history of tongue cancer and has completed chemoradiation. He has no evidence of new or residual disease in his neck and presents today for Port-A-Cath removal. He currently denies fever, headache, chest pain, dyspnea, cough, abdominal/back pain, nausea, vomiting or abnormal bleeding. He does have some right shoulder discomfort and sore throat. Past Medical History:  Diagnosis Date  . Abdominal pain 03/19/2016  . Allergic rhinitis   . Clotting disorder (HCC)    PE, suspect lupus anticoagulant  . Drug-induced skin rash 12/25/2015  . History of nephrolithiasis   . History of pulmonary embolism   . History of radiation therapy 12/17/2015- 02/03/16   Left Tonsil and Bilateral Neck  . Hypertension   . Mucositis oral 01/08/2016  . Thyroid disease    Past Surgical History:  Procedure Laterality Date  . EYE SURGERY Right 1980's   H/O right orbital "blowout" fracture  . FOOT SURGERY Left    [facility] Neuroma  . KNEE SURGERY Right    Arthroscopic      Allergies: Patient has no known allergies.  Medications: Prior to Admission medications   Medication Sig Start Date End Date Taking? Authorizing Provider  aspirin EC 81 MG tablet Take 81 mg by mouth daily.    Historical Provider, MD  ibuprofen (ADVIL,MOTRIN) 200 MG tablet Take 200 mg by mouth as needed. Reported on 01/27/2016    Historical Provider, MD  levothyroxine (SYNTHROID, LEVOTHROID) 75 MCG tablet Take 75 mcg by mouth daily before breakfast.    Historical Provider, MD  lidocaine (XYLOCAINE) 2 % solution Mix 1 part 2%viscous lidocaine,1part H2O.Swish and/or swallow 58mL of this mixture,38min before meals and at bedtime, up to QID Patient not taking: Reported on 10/02/2016  12/23/15   Eppie Gibson, MD  sodium fluoride (FLUORISHIELD) 1.1 % GEL dental gel Instill one drop of gel per tooth space of fluoride tray. Place over teeth for 5 minutes. Remove. Spit out excess. Repeat nightly. Patient not taking: Reported on 10/02/2016 12/02/15   Lenn Cal, DDS     Vital Signs: BP 109/60 (BP Location: Right Arm)   Pulse (!) 59   Temp 97.5 F (36.4 C) (Oral)   Resp 18   SpO2 100%   Physical Exam awake, alert. Clean, intact right chest wall Port-A-Cath. Chest with distant but clear breath sounds bilaterally. Heart with regular rate and rhythm. Abdomen soft, positive bowel sounds, nontender. Lower extremities -no edema.  Imaging: No results found.  Labs:  CBC:  Recent Labs  01/21/16 0934 01/28/16 0930 03/18/16 1603 05/26/16 1111  WBC 5.6 5.2 4.4 4.1  HGB 14.0 13.7 13.3 13.9  HCT 41.2 41.6 39.5 40.6  PLT 186 191 78* 149    COAGS:  Recent Labs  12/12/15 1317  INR 0.99    BMP:  Recent Labs  12/12/15 1317  01/21/16 0933 01/28/16 0929 03/18/16 1603 05/26/16 1111 10/01/16 0956  NA 136  < > 138 139 136 139  --   K 3.9  < > 4.5 4.3 3.9 4.5  --   CL 103  --   --   --   --   --   --   CO2 24  < > 28 30* 21* 28  --   GLUCOSE 91  < >  98 102 88 97  --   BUN 17  < > 10.6 10.4 17.3 16.2 18.8  CALCIUM 9.0  < > 9.3 9.6 8.1* 9.9  --   CREATININE 1.11  < > 0.9 0.9 0.8 0.8 1.0  GFRNONAA >60  --   --   --   --   --   --   GFRAA >60  --   --   --   --   --   --   < > = values in this interval not displayed.  LIVER FUNCTION TESTS:  Recent Labs  01/21/16 0933 01/28/16 0929 03/18/16 1603 05/26/16 1111  BILITOT 0.46 0.53 0.38 0.74  AST 15 16 26 16   ALT 14 17 35 15  ALKPHOS 70 77 86 81  PROT 7.0 7.1 6.3* 7.2  ALBUMIN 3.0* 3.1* 3.3* 3.7    Assessment and Plan: Pt with history of tongue cancer ; has completed chemoradiation. He has no evidence of new or residual disease in his neck and presents today for Port-A-Cath removal. Details/risks of  procedure, including but not limited to, internal bleeding, infection, injury to adjacent structures, discussed with patient and wife with their understanding and consent.   Electronically Signed: D. Rowe Robert 10/12/2016, 11:54 AM   I spent a total of 15 minutes at the the patient's bedside AND on the patient's hospital floor or unit, greater than 50% of which was counseling/coordinating care for port a cath removal

## 2016-10-12 NOTE — Discharge Instructions (Signed)
Incision Care, Adult °An incision is a surgical cut that is made through your skin. Most incisions are closed after surgery. Your incision may be closed with stitches (sutures), staples, skin glue, or adhesive strips. You may need to return to your health care provider to have sutures or staples removed. This may occur several days to several weeks after your surgery. The incision needs to be cared for properly to prevent infection. °How to care for your incision °Incision care  °· Follow instructions from your health care provider about how to take care of your incision. Make sure you: °¨ Wash your hands with soap and water before you change the bandage (dressing). If soap and water are not available, use hand sanitizer. °¨ Change your dressing as told by your health care provider. °¨ Leave sutures, skin glue, or adhesive strips in place. These skin closures may need to stay in place for 2 weeks or longer. If adhesive strip edges start to loosen and curl up, you may trim the loose edges. Do not remove adhesive strips completely unless your health care provider tells you to do that. °· Check your incision area every day for signs of infection. Check for: °¨ More redness, swelling, or pain. °¨ More fluid or blood. °¨ Warmth. °¨ Pus or a bad smell. °· Ask your health care provider how to clean the incision. This may include: °¨ Using mild soap and water. °¨ Using a clean towel to pat the incision dry after cleaning it. °¨ Applying a cream or ointment. Do this only as told by your health care provider. °¨ Covering the incision with a clean dressing. °· Ask your health care provider when you can leave the incision uncovered. °· Do not take baths, swim, or use a hot tub until your health care provider approves. Ask your health care provider if you can take showers. You may only be allowed to take sponge baths for bathing. °Medicines °· If you were prescribed an antibiotic medicine, cream, or ointment, take or apply the  antibiotic as told by your health care provider. Do not stop taking or applying the antibiotic even if your condition improves. °· Take over-the-counter and prescription medicines only as told by your health care provider. °General instructions °· Limit movement around your incision to improve healing. °¨ Avoid straining, lifting, or exercise for the first month, or for as long as told by your health care provider. °¨ Follow instructions from your health care provider about returning to your normal activities. °¨ Ask your health care provider what activities are safe. °· Protect your incision from the sun when you are outside for the first 6 months, or for as long as told by your health care provider. Apply sunscreen around the scar or cover it up. °· Keep all follow-up visits as told by your health care provider. This is important. °Contact a health care provider if: °· Your have more redness, swelling, or pain around the incision. °· You have more fluid or blood coming from the incision. °· Your incision feels warm to the touch. °· You have pus or a bad smell coming from the incision. °· You have a fever or shaking chills. °· You are nauseous or you vomit. °· You are dizzy. °· Your sutures or staples come undone. °Get help right away if: °· You have a red streak coming from your incision. °· Your incision bleeds through the dressing and the bleeding does not stop with gentle pressure. °· The edges of   your incision open up and separate. °· You have severe pain. °· You have a rash. °· You are confused. °· You faint. °· You have trouble breathing and a fast heartbeat. °This information is not intended to replace advice given to you by your health care provider. Make sure you discuss any questions you have with your health care provider. °Document Released: 05/01/2005 Document Revised: 06/19/2016 Document Reviewed: 04/29/2016 °Elsevier Interactive Patient Education © 2017 [employer] °Moderate Conscious Sedation,  Adult °Sedation is the use of medicines to promote relaxation and relieve discomfort and anxiety. Moderate conscious sedation is a type of sedation. Under moderate conscious sedation, you are less alert than normal, but you are still able to respond to instructions, touch, or both. °Moderate conscious sedation is used during short medical and dental procedures. It is milder than deep sedation, which is a type of sedation under which you cannot be easily woken up. It is also milder than general anesthesia, which is the use of medicines to make you unconscious. Moderate conscious sedation allows you to return to your regular activities sooner. °Tell a health care provider about: °· Any allergies you have. °· All medicines you are taking, including vitamins, herbs, eye drops, creams, and over-the-counter medicines. °· Use of steroids (by mouth or creams). °· Any problems you or family members have had with sedatives and anesthetic medicines. °· Any blood disorders you have. °· Any surgeries you have had. °· Any medical conditions you have, such as sleep apnea. °· Whether you are pregnant or may be pregnant. °· Any use of cigarettes, alcohol, marijuana, or street drugs. °What are the risks? °Generally, this is a safe procedure. However, problems may occur, including: °· Getting too much medicine (oversedation). °· Nausea. °· Allergic reaction to medicines. °· Trouble breathing. If this happens, a breathing tube may be used to help with breathing. It will be removed when you are awake and breathing on your own. °· Heart trouble. °· Lung trouble. °What happens before the procedure? °Staying hydrated  °Follow instructions from your health care provider about hydration, which may include: °· Up to 2 hours before the procedure - you may continue to drink clear liquids, such as water, clear fruit juice, black coffee, and plain tea. °Eating and drinking restrictions  °Follow instructions from your health care provider about  eating and drinking, which may include: °· 8 hours before the procedure - stop eating heavy meals or foods such as meat, fried foods, or fatty foods. °· 6 hours before the procedure - stop eating light meals or foods, such as toast or cereal. °· 6 hours before the procedure - stop drinking milk or drinks that contain milk. °· 2 hours before the procedure - stop drinking clear liquids. °Medicine  °Ask your health care provider about: °· Changing or stopping your regular medicines. This is especially important if you are taking diabetes medicines or blood thinners. °· Taking medicines such as aspirin and ibuprofen. These medicines can thin your blood. Do not take these medicines before your procedure if your health care provider instructs you not to. °Tests and exams °· You will have a physical exam. °· You may have blood tests done to show: °¨ How well your kidneys and liver are working. °¨ How well your blood can clot. °General instructions °· Plan to have someone take you home from the hospital or clinic. °· If you will be going home right after the procedure, plan to have someone with you for 24 hours. °  What happens during the procedure? °· An IV tube will be inserted into one of your veins. °· Medicine to help you relax (sedative) will be given through the IV tube. °· The medical or dental procedure will be performed. °What happens after the procedure? °· Your blood pressure, heart rate, breathing rate, and blood oxygen level will be monitored often until the medicines you were given have worn off. °· Do not drive for 24 hours. °This information is not intended to replace advice given to you by your health care provider. Make sure you discuss any questions you have with your health care provider. °Document Released: 07/07/2001 Document Revised: 03/17/2016 Document Reviewed: 02/01/2016 °Elsevier Interactive Patient Education © 2017 Elsevier Inc. ° °

## 2016-10-12 NOTE — Procedures (Signed)
Interventional Radiology Procedure Note  Procedure: Removal of a right IJ approach single lumen PowerPort.   Removed in entirety.    Complications: None Recommendations:  - Ok to shower tomorrow - Do not submerge for 7 days - Routine wound care   Signed,  Ezechiel Stooksbury S. Reshad Saab, DO    

## 2016-11-12 IMAGING — CR DG CHEST 2V
2 series · 2 of 2 positions shown · non-contrast
Comparison: CT chest 10/10/2010

CLINICAL DATA: Tonsil cancer.  Fever

EXAM:
CHEST  2 VIEW

[w chest pa]
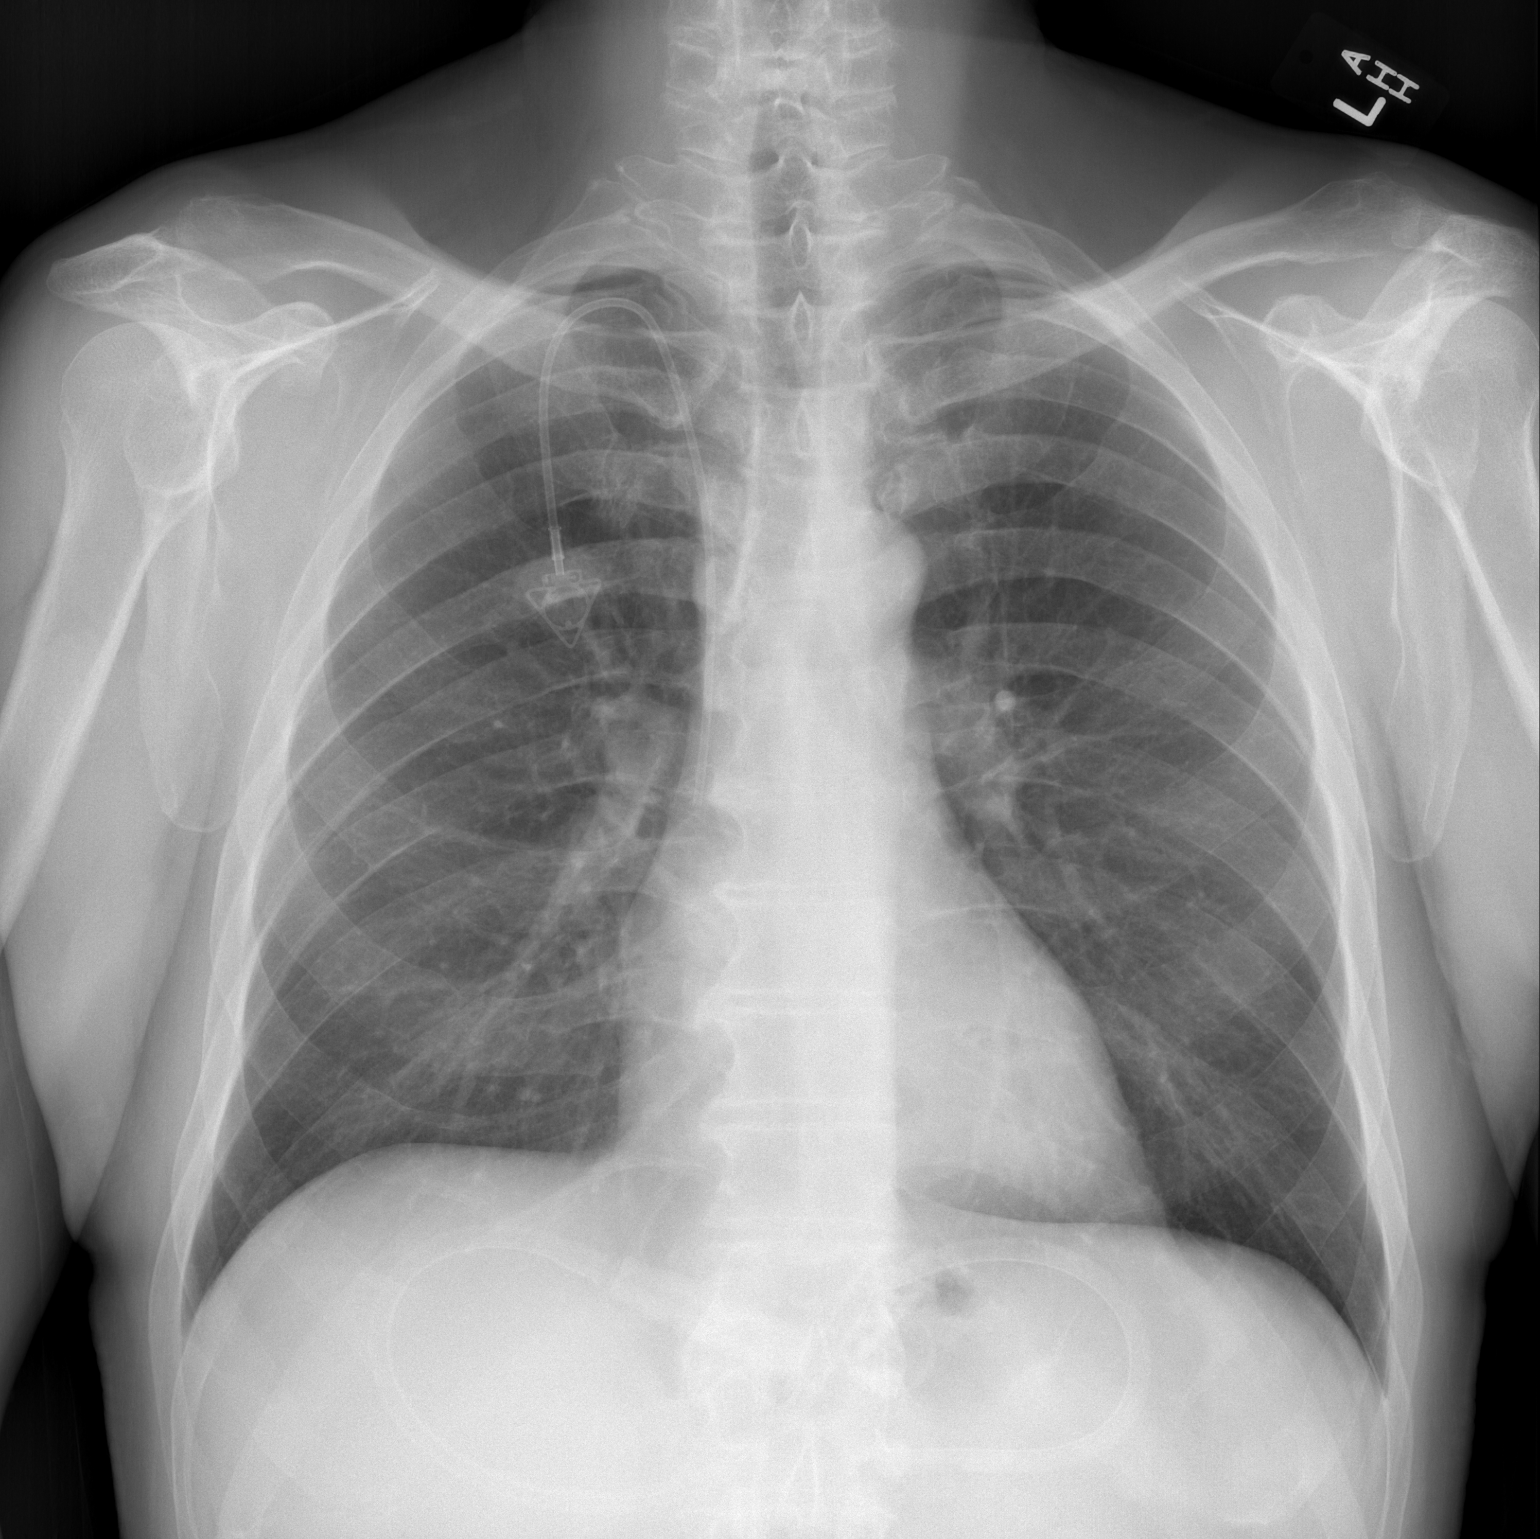

[w chest lat]
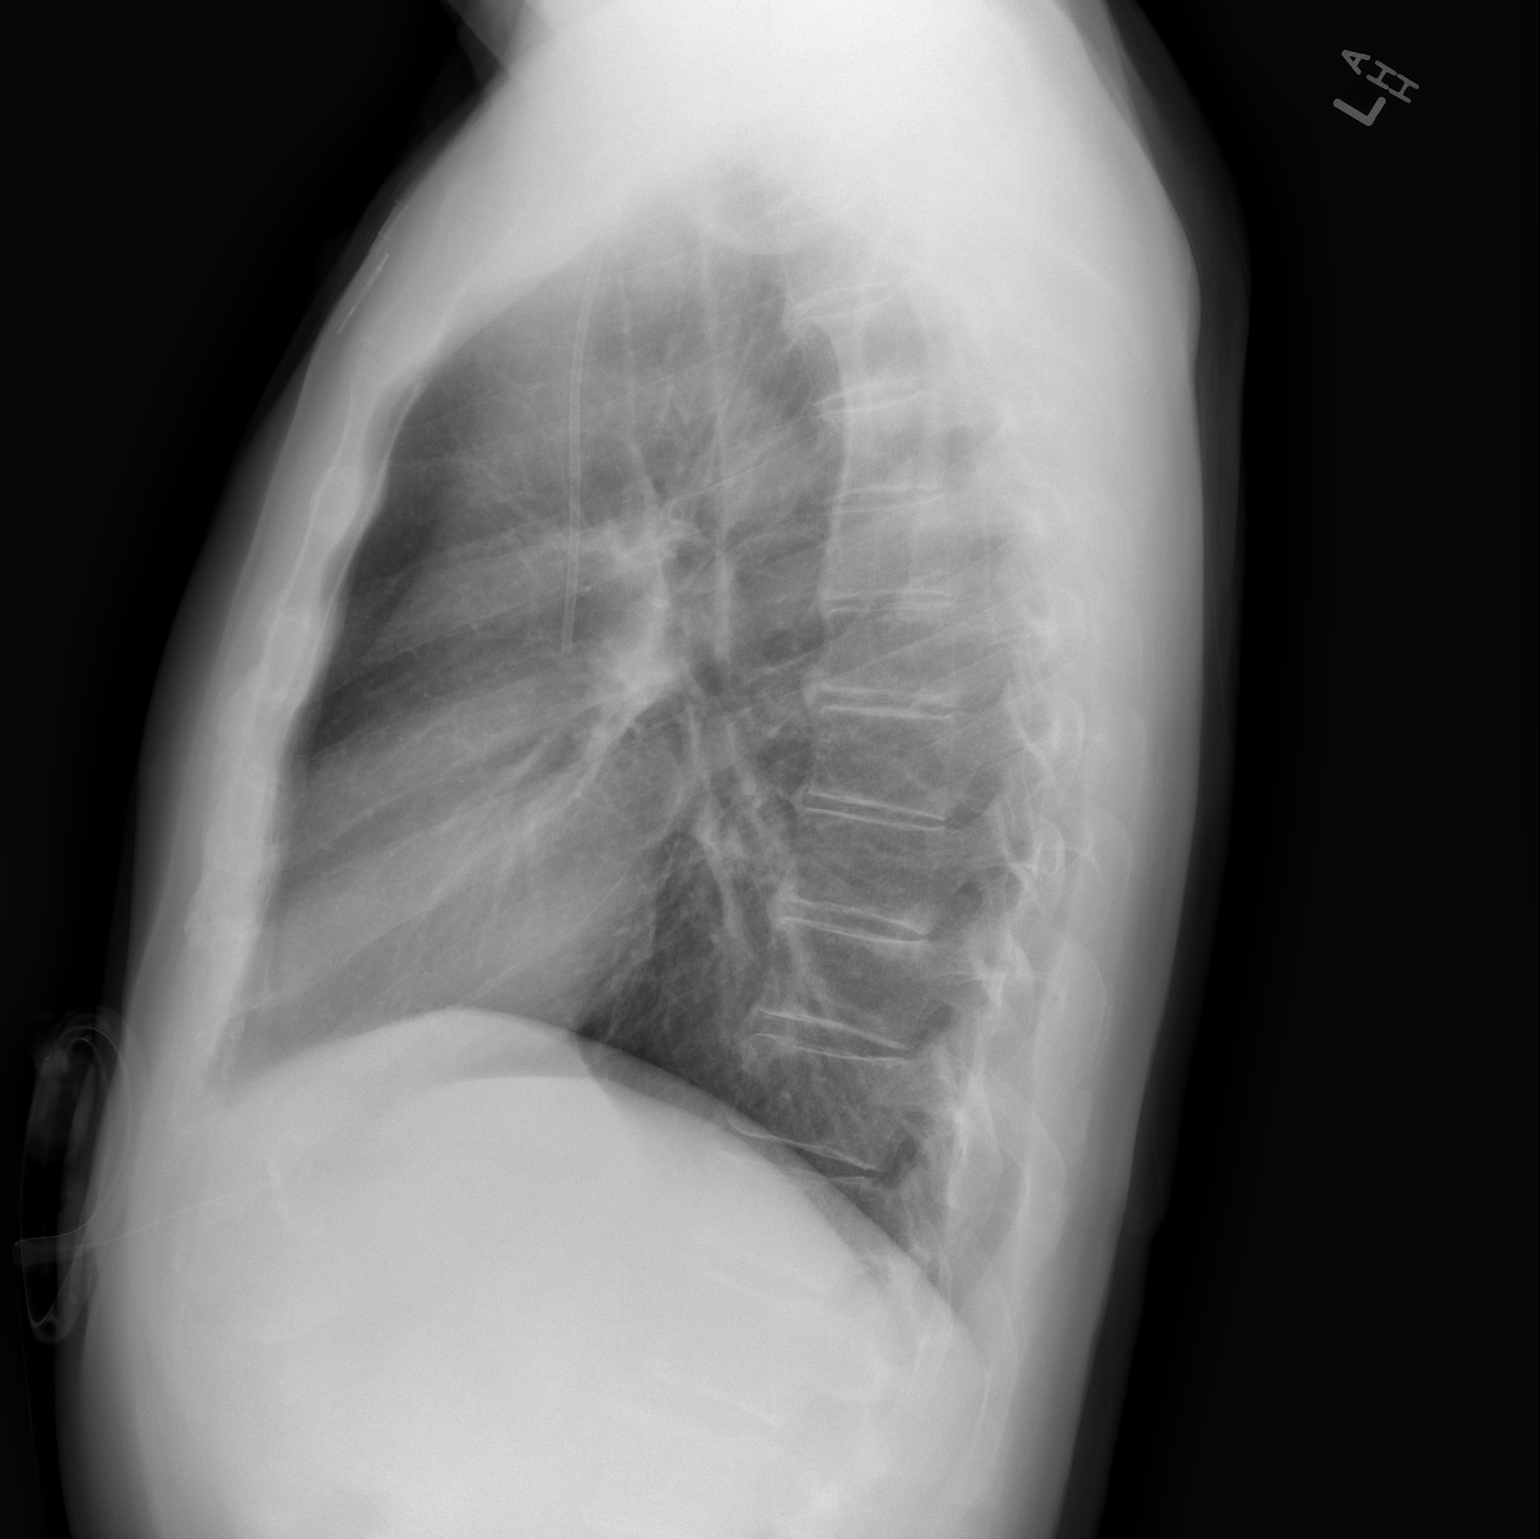

[2 of 2 positions shown; findings below may reference images not displayed]

FINDINGS: Heart size and vascularity normal. Lungs are clear without
infiltrate or effusion. Negative for mass or lung nodule.

Port-A-Cath tip in the SVC in good position. No significant skeletal
abnormality.
IMPRESSION: No active cardiopulmonary disease.

## 2016-12-02 ENCOUNTER — Other Ambulatory Visit: Payer: Self-pay | Admitting: Otolaryngology

## 2016-12-02 ENCOUNTER — Ambulatory Visit
Admission: RE | Admit: 2016-12-02 | Discharge: 2016-12-02 | Disposition: A | Discharge status: Home / Self Care | Payer: 59 | Source: Ambulatory Visit | Attending: Otolaryngology | Admitting: Otolaryngology

## 2016-12-02 DIAGNOSIS — Z85810 Personal history of malignant neoplasm of tongue: Secondary | ICD-10-CM

## 2016-12-04 NOTE — Therapy (Signed)
[facility] [address] [place], [place], [address] Phone: [phone]   Fax:  [phone]  Patient Details  Name: Gregory Jenkins MRN: [id number] Date of Birth: 05-03-1951 Referring Provider:  No ref. provider found  Encounter Date: 12/04/2016  SPEECH THERAPY DISCHARGE SUMMARY  Visits from Start of Care: approx 4  Current functional level related to goals / functional outcomes: Pt seen for approx 4 ST sessions addressing dysphagia during head/neck radiation. Pt's last visit was on 05-19-16. See that note for details. On 06-17-16, pt was phoned for scheduling follow up ST and told scheduler he no longer needed ST, and that he was swallowing well.   Remaining deficits: Unknown, pt last seen 05-19-16.   Education / Equipment: HEP for swallowing, late effects head/neck radiation.   Plan: Patient agrees to discharge.  Patient goals were partially met. Patient is being discharged due to not returning since the last visit.  ?????       [facility] ,MS, CCC-SLP  12/04/2016, 9:31 AM  [facility] [address] [place], [place], [address] Phone: [phone]   Fax:  [phone]

## 2017-04-02 ENCOUNTER — Telehealth: Payer: Self-pay | Admitting: Hematology and Oncology

## 2017-04-02 ENCOUNTER — Ambulatory Visit (HOSPITAL_BASED_OUTPATIENT_CLINIC_OR_DEPARTMENT_OTHER): Payer: 59 | Admitting: Hematology and Oncology

## 2017-04-02 ENCOUNTER — Encounter: Payer: Self-pay | Admitting: Hematology and Oncology

## 2017-04-02 DIAGNOSIS — E039 Hypothyroidism, unspecified: Secondary | ICD-10-CM

## 2017-04-02 DIAGNOSIS — K117 Disturbances of salivary secretion: Secondary | ICD-10-CM | POA: Diagnosis not present

## 2017-04-02 DIAGNOSIS — E079 Disorder of thyroid, unspecified: Secondary | ICD-10-CM | POA: Insufficient documentation

## 2017-04-02 DIAGNOSIS — Y842 Radiological procedure and radiotherapy as the cause of abnormal reaction of the patient, or of later complication, without mention of misadventure at the time of the procedure: Secondary | ICD-10-CM | POA: Insufficient documentation

## 2017-04-02 DIAGNOSIS — C090 Malignant neoplasm of tonsillar fossa: Secondary | ICD-10-CM | POA: Diagnosis not present

## 2017-04-02 MED ORDER — PILOCARPINE HCL 7.5 MG PO TABS: 7.5000 mg | ORAL_TABLET | Freq: Three times a day (TID) | ORAL | 1 refills | Status: DC

## 2017-04-02 NOTE — Assessment & Plan Note (Signed)
Clinically, he has no signs of disease He will continue to see me once a year. I reinforced importance of close follow-up with ENT

## 2017-04-02 NOTE — Assessment & Plan Note (Signed)
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 Some of the expected risk is discussed with the patient dizziness, sedation and headaches

## 2017-04-02 NOTE — Assessment & Plan Note (Signed)
He has hypothyroidism and is on thyroid replacement therapy. His primary care doctor is managing his medication

## 2017-04-02 NOTE — Progress Notes (Signed)
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 Mild asymmetry of the left tonsil -- correlate with direct visualization to assess for primary neoplasm.      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      11/28/2015 PET scan    1. Left palatine tonsil primary with left worse than right cervical nodal metastasis. 2. No extracervical hypermetabolic metastasis.       12/12/2015 Procedure    Peg and port-a-cath placement.      12/18/2015 - 01/29/2016 Chemotherapy    He received weekly cetuximab      12/18/2015 - 02/03/2016 Radiation Therapy    Received Hel[facility] IMRT Tomotherapy:  Left tonsil and bilateral neck / 70 Gy in 35 fractions to gross disease, 63 Gy in 35 fractions to high risk nodal echelons, and 56 Gy in 35 fractions to intermediate risk nodal echelons.      12/31/2015 Adverse Reaction    Treatment #3 is placed on hold due to worsening mucositis      01/08/2016 Miscellaneous    Cetuximab #3 resumed with 50% dose adjustment      01/14/2016 Adverse Reaction    Treatment #4 is placed on hold due to worsening mucositis and failure to thrive      03/20/2016 Imaging    CT abdomen showed mild gallbladder wall  thickening with pericholecystic fluid and several gallstones. The gallbladder is nondistended. Recommend clinical correlation for acute or chronic cholecystitis.      04/15/2016 Procedure    PEG removed.      05/26/2016 Imaging    Restaging PET:  Marked interval improvement with resolution of left tonsillar hypermetabolism and right cervical lymph node hypermetabolism. Left cervical lymph nodes have decreased substantially in size and there is a subtle degree of FDG hypermetabolism still associated with these left-sided cervical nodes.      10/12/2016 Procedure    Port-a-cath removed.       INTERVAL HISTORY: Please see below for problem oriented charting. He is seen for further follow-up. He is doing well He denies recent dysphagia.  No new lymphadenopathy.  He has chronic dry mouth. No recent voice changes or weight loss. He denies chronic neuropathy or hearing deficit since chemotherapy is completed He denies pain  REVIEW OF SYSTEMS:   Constitutional: Denies fevers, chills or abnormal weight loss Eyes: Denies blurriness of vision Ears, nose, mouth, throat, and face: Denies mucositis or sore throat Respiratory: Denies cough, dyspnea or wheezes Cardiovascular: Denies palpitation, chest discomfort or lower extremity swelling Gastrointestinal:  Denies nausea, heartburn or change in bowel habits Skin: Denies abnormal skin rashes Lymphatics: Denies new lymphadenopathy or easy bruising Neurological:Denies numbness, tingling or new weaknesses Behavioral/Psych: Mood is stable, no new changes  All other systems were reviewed  with the patient and are negative.  I have reviewed the past medical history, past surgical history, social history and family history with the patient and they are unchanged from previous note.  ALLERGIES:  has No Known Allergies.  MEDICATIONS:  Current Outpatient Prescriptions  Medication Sig Dispense Refill  . aspirin EC 81 MG tablet Take 81 mg by mouth  daily.    Marland Kitchen ibuprofen (ADVIL,MOTRIN) 200 MG tablet Take 200 mg by mouth as needed. Reported on 01/27/2016    . levothyroxine (SYNTHROID, LEVOTHROID) 75 MCG tablet Take 75 mcg by mouth daily before breakfast.    . lidocaine (XYLOCAINE) 2 % solution Mix 1 part 2%viscous lidocaine,1part H2O.Swish and/or swallow 76mL of this mixture,97min before meals and at bedtime, up to QID (Patient not taking: Reported on 10/02/2016) 100 mL 5   No current facility-administered medications for this visit.     PHYSICAL EXAMINATION: ECOG PERFORMANCE STATUS: 0 - Asymptomatic  Vitals:   04/02/17 0836  BP: 126/77  Pulse: (!) 57  Resp: 18  Temp: 98.2 F (36.8 C)   Filed Weights   04/02/17 0836  Weight: 176 lb 14.4 oz (80.2 kg)    GENERAL:alert, no distress and comfortable SKIN: skin color, texture, turgor are normal, no rashes or significant lesions EYES: normal, Conjunctiva are pink and non-injected, sclera clear OROPHARYNX:no exudate, no erythema and lips, buccal mucosa, and tongue normal  NECK: Mild lymphedema and skin discoloration is noted around his neck LYMPH:  no palpable lymphadenopathy in the cervical, axillary or inguinal LUNGS: clear to auscultation and percussion with normal breathing effort HEART: regular rate & rhythm and no murmurs and no lower extremity edema ABDOMEN:abdomen soft, non-tender and normal bowel sounds Musculoskeletal:no cyanosis of digits and no clubbing  NEURO: alert & oriented x 3 with fluent speech, no focal motor/sensory deficits  LABORATORY DATA:  I have reviewed the data as listed    Component Value Date/Time   NA 140 10/12/2016 1202   NA 139 05/26/2016 1111   K 4.6 10/12/2016 1202   K 4.5 05/26/2016 1111   CL 104 10/12/2016 1202   CO2 31 10/12/2016 1202   CO2 28 05/26/2016 1111   GLUCOSE 106 (H) 10/12/2016 1202   GLUCOSE 97 05/26/2016 1111   BUN 19 10/12/2016 1202   BUN 18.8 10/01/2016 0956   CREATININE 0.94 10/12/2016 1202   CREATININE 1.0 10/01/2016  0956   CALCIUM 9.1 10/12/2016 1202   CALCIUM 9.9 05/26/2016 1111   PROT 7.2 05/26/2016 1111   ALBUMIN 3.7 05/26/2016 1111   AST 16 05/26/2016 1111   ALT 15 05/26/2016 1111   ALKPHOS 81 05/26/2016 1111   BILITOT 0.74 05/26/2016 1111   GFRNONAA >60 10/12/2016 1202   GFRAA >60 10/12/2016 1202    No results found for: SPEP, UPEP  Lab Results  Component Value Date   WBC 7.4 10/12/2016   NEUTROABS 3.2 05/26/2016   HGB 14.7 10/12/2016   HCT 43.3 10/12/2016   MCV 97.3 10/12/2016   PLT 157 10/12/2016      Chemistry      Component Value Date/Time   NA 140 10/12/2016 1202   NA 139 05/26/2016 1111   K 4.6 10/12/2016 1202   K 4.5 05/26/2016 1111   CL 104 10/12/2016 1202   CO2 31 10/12/2016 1202   CO2 28 05/26/2016 1111   BUN 19 10/12/2016 1202   BUN 18.8 10/01/2016 0956   CREATININE 0.94 10/12/2016 1202   CREATININE 1.0 10/01/2016 0956  Component Value Date/Time   CALCIUM 9.1 10/12/2016 1202   CALCIUM 9.9 05/26/2016 1111   ALKPHOS 81 05/26/2016 1111   AST 16 05/26/2016 1111   ALT 15 05/26/2016 1111   BILITOT 0.74 05/26/2016 1111     ASSESSMENT & PLAN:  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 Some of the expected risk is discussed with the patient dizziness, sedation and headaches  Thyroid disease He has hypothyroidism and is on thyroid replacement therapy. His primary care doctor is managing his medication   No orders of the defined types were placed in this encounter.  All questions were answered. The patient knows to call the clinic with any problems, questions or concerns. No barriers to learning was detected. I spent 15 minutes counseling the patient face to face. The total time spent in the appointment was  20 minutes and more than 50% was on counseling and review of test results     Heath Lark, MD 04/02/2017 10:55 AM

## 2017-04-02 NOTE — Telephone Encounter (Signed)
Appointments scheduled per 04/03/17 los. F/U 15 mins, per Dr Alvy Bimler. Patient was given a copy of the AVS report and appointment schedule, per 04/02/17 los.

## 2017-04-23 ENCOUNTER — Encounter: Payer: Self-pay | Admitting: Adult Health

## 2017-05-28 ENCOUNTER — Telehealth: Payer: Self-pay | Admitting: *Deleted

## 2017-05-28 NOTE — Telephone Encounter (Signed)
On 05-28-17 fax medical records to the [facility], it was consult note, end of tx note, sim and planning note, follow up note, and a picture

## 2018-01-01 ENCOUNTER — Encounter (HOSPITAL_COMMUNITY)
Admission: EM | Disposition: A | Discharge status: Home / Self Care | Payer: Self-pay | Source: Home / Self Care | Attending: Emergency Medicine

## 2018-01-01 ENCOUNTER — Ambulatory Visit (HOSPITAL_COMMUNITY)
Admission: EM | Admit: 2018-01-01 | Discharge: 2018-01-01 | Disposition: A | Discharge status: Home / Self Care | Payer: 59 | Attending: Emergency Medicine | Admitting: Emergency Medicine

## 2018-01-01 ENCOUNTER — Emergency Department (HOSPITAL_COMMUNITY): Payer: 59 | Admitting: Anesthesiology

## 2018-01-01 ENCOUNTER — Encounter (HOSPITAL_COMMUNITY): Payer: Self-pay | Admitting: Emergency Medicine

## 2018-01-01 ENCOUNTER — Emergency Department (HOSPITAL_COMMUNITY): Payer: 59

## 2018-01-01 DIAGNOSIS — Z7982 Long term (current) use of aspirin: Secondary | ICD-10-CM | POA: Diagnosis not present

## 2018-01-01 DIAGNOSIS — Z808 Family history of malignant neoplasm of other organs or systems: Secondary | ICD-10-CM | POA: Insufficient documentation

## 2018-01-01 DIAGNOSIS — J309 Allergic rhinitis, unspecified: Secondary | ICD-10-CM | POA: Diagnosis not present

## 2018-01-01 DIAGNOSIS — I10 Essential (primary) hypertension: Secondary | ICD-10-CM | POA: Insufficient documentation

## 2018-01-01 DIAGNOSIS — Z87891 Personal history of nicotine dependence: Secondary | ICD-10-CM | POA: Diagnosis not present

## 2018-01-01 DIAGNOSIS — Z800 Family history of malignant neoplasm of digestive organs: Secondary | ICD-10-CM | POA: Diagnosis not present

## 2018-01-01 DIAGNOSIS — S61314A Laceration without foreign body of right ring finger with damage to nail, initial encounter: Secondary | ICD-10-CM | POA: Diagnosis present

## 2018-01-01 DIAGNOSIS — W3189XA Contact with other specified machinery, initial encounter: Secondary | ICD-10-CM | POA: Insufficient documentation

## 2018-01-01 DIAGNOSIS — E039 Hypothyroidism, unspecified: Secondary | ICD-10-CM | POA: Diagnosis not present

## 2018-01-01 DIAGNOSIS — Z801 Family history of malignant neoplasm of trachea, bronchus and lung: Secondary | ICD-10-CM | POA: Diagnosis not present

## 2018-01-01 DIAGNOSIS — S68625A Partial traumatic transphalangeal amputation of left ring finger, initial encounter: Secondary | ICD-10-CM | POA: Diagnosis not present

## 2018-01-01 DIAGNOSIS — K117 Disturbances of salivary secretion: Secondary | ICD-10-CM | POA: Insufficient documentation

## 2018-01-01 DIAGNOSIS — Z923 Personal history of irradiation: Secondary | ICD-10-CM | POA: Insufficient documentation

## 2018-01-01 DIAGNOSIS — S62634B Displaced fracture of distal phalanx of right ring finger, initial encounter for open fracture: Secondary | ICD-10-CM | POA: Diagnosis present

## 2018-01-01 DIAGNOSIS — Z79899 Other long term (current) drug therapy: Secondary | ICD-10-CM | POA: Diagnosis not present

## 2018-01-01 DIAGNOSIS — D689 Coagulation defect, unspecified: Secondary | ICD-10-CM | POA: Diagnosis not present

## 2018-01-01 DIAGNOSIS — Z87442 Personal history of urinary calculi: Secondary | ICD-10-CM | POA: Insufficient documentation

## 2018-01-01 DIAGNOSIS — Z803 Family history of malignant neoplasm of breast: Secondary | ICD-10-CM | POA: Insufficient documentation

## 2018-01-01 DIAGNOSIS — Z86711 Personal history of pulmonary embolism: Secondary | ICD-10-CM | POA: Insufficient documentation

## 2018-01-01 DIAGNOSIS — Z85818 Personal history of malignant neoplasm of other sites of lip, oral cavity, and pharynx: Secondary | ICD-10-CM | POA: Insufficient documentation

## 2018-01-01 HISTORY — PX: AMPUTATION: SHX166

## 2018-01-01 HISTORY — DX: Malignant neoplasm of tonsil, unspecified: C09.9

## 2018-01-01 SURGERY — AMPUTATION DIGIT
Anesthesia: Monitor Anesthesia Care | Site: Finger | Laterality: Left

## 2018-01-01 MED ORDER — MIDAZOLAM HCL 5 MG/5ML IJ SOLN
INTRAMUSCULAR | Status: DC | PRN
  Administered 2018-01-01 (×2): 1 mg via INTRAVENOUS

## 2018-01-01 MED ORDER — LACTATED RINGERS IV SOLN
INTRAVENOUS | Status: DC | PRN
  Administered 2018-01-01: 19:00:00 via INTRAVENOUS

## 2018-01-01 MED ORDER — PROPOFOL 10 MG/ML IV BOLUS
INTRAVENOUS | Status: AC
  Filled 2018-01-01: qty 20

## 2018-01-01 MED ORDER — ONDANSETRON HCL 4 MG/2ML IJ SOLN
INTRAMUSCULAR | Status: DC | PRN
  Administered 2018-01-01: 4 mg via INTRAVENOUS

## 2018-01-01 MED ORDER — OXYCODONE HCL 5 MG PO TABS: 5.0000 mg | ORAL_TABLET | Freq: Once | ORAL | Status: DC | PRN

## 2018-01-01 MED ORDER — BUPIVACAINE HCL (PF) 0.25 % IJ SOLN
INTRAMUSCULAR | Status: AC
  Filled 2018-01-01: qty 30

## 2018-01-01 MED ORDER — PROPOFOL 500 MG/50ML IV EMUL
INTRAVENOUS | Status: DC | PRN
  Administered 2018-01-01: 75 ug/kg/min via INTRAVENOUS

## 2018-01-01 MED ORDER — CEFAZOLIN SODIUM-DEXTROSE 2-4 GM/100ML-% IV SOLN
2.0000 g | INTRAVENOUS | Status: AC
  Administered 2018-01-01: 2 g via INTRAVENOUS

## 2018-01-01 MED ORDER — FENTANYL CITRATE (PF) 100 MCG/2ML IJ SOLN
INTRAMUSCULAR | Status: DC | PRN
  Administered 2018-01-01: 25 ug via INTRAVENOUS

## 2018-01-01 MED ORDER — LACTATED RINGERS IV SOLN
INTRAVENOUS | Status: DC
  Administered 2018-01-01: 19:00:00 via INTRAVENOUS

## 2018-01-01 MED ORDER — CEFAZOLIN SODIUM-DEXTROSE 2-4 GM/100ML-% IV SOLN
INTRAVENOUS | Status: AC
  Filled 2018-01-01: qty 100

## 2018-01-01 MED ORDER — LIDOCAINE HCL (PF) 1 % IJ SOLN
INTRAMUSCULAR | Status: DC | PRN
  Administered 2018-01-01: 5 mL

## 2018-01-01 MED ORDER — FENTANYL CITRATE (PF) 250 MCG/5ML IJ SOLN
INTRAMUSCULAR | Status: AC
  Filled 2018-01-01: qty 5

## 2018-01-01 MED ORDER — FENTANYL CITRATE (PF) 100 MCG/2ML IJ SOLN: 25.0000 ug | INTRAMUSCULAR | Status: DC | PRN

## 2018-01-01 MED ORDER — MIDAZOLAM HCL 2 MG/2ML IJ SOLN
INTRAMUSCULAR | Status: AC
  Filled 2018-01-01: qty 2

## 2018-01-01 MED ORDER — OXYCODONE HCL 5 MG/5ML PO SOLN: 5.0000 mg | Freq: Once | ORAL | Status: DC | PRN

## 2018-01-01 MED ORDER — CEPHALEXIN 500 MG PO CAPS: 500.0000 mg | ORAL_CAPSULE | Freq: Four times a day (QID) | ORAL | 0 refills | Status: AC

## 2018-01-01 MED ORDER — BUPIVACAINE HCL (PF) 0.25 % IJ SOLN
INTRAMUSCULAR | Status: DC | PRN
  Administered 2018-01-01: 5 mL

## 2018-01-01 MED ORDER — CHLORHEXIDINE GLUCONATE 4 % EX LIQD: 60.0000 mL | Freq: Once | CUTANEOUS | Status: DC

## 2018-01-01 MED ORDER — LIDOCAINE HCL (PF) 1 % IJ SOLN
INTRAMUSCULAR | Status: AC
  Filled 2018-01-01: qty 30

## 2018-01-01 SURGICAL SUPPLY — 46 items
BANDAGE ACE 3X5.8 VEL STRL LF (GAUZE/BANDAGES/DRESSINGS) IMPLANT
BANDAGE ACE 4X5 VEL STRL LF (GAUZE/BANDAGES/DRESSINGS) IMPLANT
BNDG COHESIVE 1X5 TAN STRL LF (GAUZE/BANDAGES/DRESSINGS) ×2 IMPLANT
BNDG CONFORM 2 STRL LF (GAUZE/BANDAGES/DRESSINGS) ×2 IMPLANT
BNDG ELASTIC 2X5.8 VLCR STR LF (GAUZE/BANDAGES/DRESSINGS) ×2 IMPLANT
CORDS BIPOLAR (ELECTRODE) ×2 IMPLANT
COVER SURGICAL LIGHT HANDLE (MISCELLANEOUS) ×2 IMPLANT
CUFF TOURNIQUET SINGLE 18IN (TOURNIQUET CUFF) ×2 IMPLANT
DRAPE SURG 17X23 STRL (DRAPES) ×2 IMPLANT
DRSG EMULSION OIL 3X3 NADH (GAUZE/BANDAGES/DRESSINGS) ×2 IMPLANT
GAUZE SPONGE 2X2 8PLY STRL LF (GAUZE/BANDAGES/DRESSINGS) IMPLANT
GAUZE SPONGE 4X4 12PLY STRL (GAUZE/BANDAGES/DRESSINGS) ×2 IMPLANT
GLOVE BIOGEL PI IND STRL 7.0 (GLOVE) ×1 IMPLANT
GLOVE BIOGEL PI IND STRL 8 (GLOVE) ×1 IMPLANT
GLOVE BIOGEL PI IND STRL 8.5 (GLOVE) ×1 IMPLANT
GLOVE BIOGEL PI INDICATOR 7.0 (GLOVE) ×1
GLOVE BIOGEL PI INDICATOR 8 (GLOVE) ×1
GLOVE BIOGEL PI INDICATOR 8.5 (GLOVE) ×1
GLOVE SURG ORTHO 8.0 STRL STRW (GLOVE) ×2 IMPLANT
GLOVE SURG SS PI 7.0 STRL IVOR (GLOVE) ×2 IMPLANT
GOWN STRL REUS W/ TWL LRG LVL3 (GOWN DISPOSABLE) ×2 IMPLANT
GOWN STRL REUS W/ TWL XL LVL3 (GOWN DISPOSABLE) ×1 IMPLANT
GOWN STRL REUS W/TWL LRG LVL3 (GOWN DISPOSABLE) ×4
GOWN STRL REUS W/TWL XL LVL3 (GOWN DISPOSABLE) ×2
KIT BASIN OR (CUSTOM PROCEDURE TRAY) ×2 IMPLANT
KIT ROOM TURNOVER OR (KITS) ×2 IMPLANT
MANIFOLD NEPTUNE II (INSTRUMENTS) ×2 IMPLANT
NEEDLE HYPO 25GX1X1/2 BEV (NEEDLE) IMPLANT
NS IRRIG 1000ML POUR BTL (IV SOLUTION) ×2 IMPLANT
PACK ORTHO EXTREMITY (CUSTOM PROCEDURE TRAY) ×2 IMPLANT
PAD ARMBOARD 7.5X6 YLW CONV (MISCELLANEOUS) ×2 IMPLANT
PAD CAST 4YDX4 CTTN HI CHSV (CAST SUPPLIES) IMPLANT
PADDING CAST COTTON 4X4 STRL (CAST SUPPLIES)
SOAP 2 % CHG 4 OZ (WOUND CARE) ×2 IMPLANT
SPECIMEN JAR SMALL (MISCELLANEOUS) ×2 IMPLANT
SPONGE GAUZE 2X2 STER 10/PKG (GAUZE/BANDAGES/DRESSINGS)
SUCTION FRAZIER HANDLE 10FR (MISCELLANEOUS)
SUCTION TUBE FRAZIER 10FR DISP (MISCELLANEOUS) IMPLANT
SUT MERSILENE 4 0 P 3 (SUTURE) IMPLANT
SUT PROLENE 4 0 P 3 18 (SUTURE) ×4 IMPLANT
SUT PROLENE 4 0 PS 2 18 (SUTURE) IMPLANT
SYR CONTROL 10ML LL (SYRINGE) IMPLANT
TOWEL OR 17X24 6PK STRL BLUE (TOWEL DISPOSABLE) ×2 IMPLANT
TOWEL OR 17X26 10 PK STRL BLUE (TOWEL DISPOSABLE) ×2 IMPLANT
TUBE CONNECTING 12X1/4 (SUCTIONS) IMPLANT
WATER STERILE IRR 1000ML POUR (IV SOLUTION) ×2 IMPLANT

## 2018-01-01 NOTE — Transfer of Care (Signed)
Immediate Anesthesia Transfer of Care Note  Patient: Gregory Jenkins  Procedure(s) Performed: REVISION AMPUTATION RING FINGER (Left Finger)  Patient Location: PACU  Anesthesia Type:MAC  Level of Consciousness: awake, alert  and oriented  Airway & Oxygen Therapy: Patient Spontanous Breathing  Post-op Assessment: Report given to RN and Post -op Vital signs reviewed and stable  Post vital signs: Reviewed and stable  Last Vitals:  Vitals:   01/01/18 1739 01/01/18 1928  BP: 110/74 (P) 118/69  Pulse: 65 (P) 62  Resp: 17 (P) 14  Temp:  (P) 36.5 C  SpO2: 97% (P) 100%    Last Pain:  Vitals:   01/01/18 1741  TempSrc:   PainSc: 2       Patients Stated Pain Goal: 3 (09/15/61 4469)  Complications: No apparent anesthesia complications

## 2018-01-01 NOTE — Op Note (Signed)
PREOPERATIVE DIAGNOSIS: Left ring finger amputation through the level of the distal phalanx with open distal phalanx fracture  POSTOPERATIVE DIAGNOSIS: Same  ATTENDING SURGEON: Dr. Iran Planas who was scrubbed and present for the entire procedure  ASSISTANT SURGEON: None  ANESTHESIA: 1% Xylocaine 1/4% Marcaine with IV sedation  OPERATIVE PROCEDURE: #1: Open debridement of skin and subcutaneous tissue associated with open fracture of the ring finger #2: Revision amputation left ring finger with primary closure #3: Primary excision of the nail matrix and bed  IMPLANTS:None  RADIOGRAPHIC INTERPRETATION:None  SURGICAL INDICATIONS:Patient is a right-hand-dominant gentleman who sustained injury to his left ring finger. Patient was seen and evaluated in the emergency department and hospital and recommended undergo the above procedure. Risks of surgery include but not limited to bleeding infection damage to nearby nerves arteries or tendons loss of motion of the wrists and digits incomplete relief of symptoms and need for further surgical intervention  Alger is properly identified in the preoperative holding area and a mark with a permanent marker made on the left ring finger to indicate correct operative site. Patient then brought back to operating room placed supine on anesthesia and table where the IV sedation was administered. The local anesthetic was administered. A well-padded tourniquet placed on the left forearm and sealed with the appropriate drape. A timeout was called the correct site was identified and the procedure then begun. Limb was then elevated tourniquet insufflated. Excisional debridement was then carried out of the skin and subcutaneous tissue as well as the devitalized bone. This was then carried out with a knife and Roger. Thorough where wound irrigation then carried out. The patient had very little remaining of the nail plate the nail plate was then removed  and the underlying nail matrix was then excised. The bone was then shortened with a bone cutter. The wound was then thoroughly irrigated. Skin flap was then brought from a volar to dorsal direction and primary closure was able to be done. This was then closed with Prolene suture. Thorough wound irrigation done and the tourniquet deflated there is good perfusion of the finger. Adaptic dressing a sterile compressive bandage then applied. Patient was then placed in a small finger splint taken recovery room in good condition.  POSTOPERATIVE PLAN Patient to be discharged to home, patient be seen back now office and proxy 10-11 days for wound check x-rays sutures out around the 3 week mark. Radiographs only first visit.:

## 2018-01-01 NOTE — ED Notes (Signed)
Pt transferring by private vehicle with spouse to [facility] ED. Pt will be taken to surgery from there. Per Dr Eulis Foster, the [facility] and surgeon are waiting on pt to arrive. Pt leaves ED with spouse, ambulatory w/o difficulty and with steady gait.

## 2018-01-01 NOTE — Anesthesia Procedure Notes (Signed)
Procedure Name: MAC Date/Time: 01/01/2018 6:51 PM Performed by: Suzy Bouchard, CRNA Pre-anesthesia Checklist: Patient identified, Emergency Drugs available, Suction available, Patient being monitored and Timeout performed Patient Re-evaluated:Patient Re-evaluated prior to induction Oxygen Delivery Method: Simple face mask

## 2018-01-01 NOTE — Discharge Instructions (Signed)
Go to the [facility] emergency department now, to see Dr. Caralyn Guile where he will take you to the operating room for surgical repair of the left ring finger laceration

## 2018-01-01 NOTE — ED Provider Notes (Signed)
[facility] DEPT Provider Note   CSN: [id number] Arrival date & time: 01/01/18  1514     History   Chief Complaint Chief Complaint  Patient presents with  . Laceration    HPI Gregory Jenkins is a 67 y.o. male.  The patient complains of injury to left ring finger, by a joiner, just prior to arrival.  No other injury.  He states his last tetanus booster was 3 years ago.  He denies headache, nausea, vomiting, weakness or dizziness.  He last ate at 1 PM.  He is right-handed.  There are no other known modifying factors.  HPI  Past Medical History:  Diagnosis Date  . Abdominal pain 03/19/2016  . Allergic rhinitis   . Clotting disorder (HCC)    PE, suspect lupus anticoagulant  . Drug-induced skin rash 12/25/2015  . History of nephrolithiasis   . History of pulmonary embolism   . History of radiation therapy 12/17/2015- 02/03/16   Left Tonsil and Bilateral Neck  . Hypertension   . Mucositis oral 01/08/2016  . Thyroid disease     Patient Active Problem List   Diagnosis Date Noted  . 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 Thyroid disease 04/02/2017  . 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 EYE SURGERY Right 1980's   H/O right orbital "blowout" fracture  . FOOT SURGERY Left    [facility] Neuroma  . IR GENERIC HISTORICAL  10/12/2016   IR REMOVAL TUN ACCESS W/ PORT W/O FL MOD SED 10/12/2016 Gregory Mckusick, DO [facility]  . KNEE SURGERY Right    Arthroscopic       Home Medications    Prior to Admission medications   Medication Sig Start Date End Date Taking? Authorizing Provider  aspirin EC 81 MG tablet Take 81 mg by mouth daily.    [provider]  ibuprofen (ADVIL,MOTRIN) 200 MG tablet Take 200 mg by mouth as needed. Reported on 01/27/2016    [provider]  levothyroxine (SYNTHROID, LEVOTHROID) 75 MCG tablet Take 75 mcg by mouth daily before  breakfast.    [provider]  lidocaine (XYLOCAINE) 2 % solution Mix 1 part 2%viscous lidocaine,1part H2O.Swish and/or swallow 58mL of this mixture,17min before meals and at bedtime, up to QID Patient not taking: Reported on 10/02/2016 12/23/15   Eppie Gibson, MD    Family History Family History  Problem Relation Age of Onset  . Cancer Father        esophageal ca  . Cancer Maternal Grandmother        brain ca  . Cancer Maternal Grandfather        lung ca  . COPD Mother   . Cancer Maternal Aunt        breast ca    Social History Social History   Tobacco Use  . Smoking status: Never Smoker  . Smokeless tobacco: Former Systems developer    Types: Chew  Substance Use Topics  . Alcohol use: Yes    Alcohol/week: 0.0 oz    Comment: He reports he is a social drinker  . Drug use: No     Allergies   Patient has no known allergies.   Review of Systems Review of Systems  All other systems reviewed and are negative.    Physical Exam Updated Vital Signs BP (!) 144/71 (BP Location: Right Arm)   Pulse 60   Temp 98.1 F (36.7 C) (Oral)  Resp 18   SpO2 100%   Physical Exam  Constitutional: He is oriented to person, place, and time. He appears well-developed and well-nourished.  HENT:  Head: Normocephalic and atraumatic.  Right Ear: External ear normal.  Left Ear: External ear normal.  Eyes: Conjunctivae and EOM are normal. Pupils are equal, round, and reactive to light.  Neck: Normal range of motion and phonation normal. Neck supple.  Cardiovascular: Normal rate.  Pulmonary/Chest: Effort normal. He exhibits no bony tenderness.  Musculoskeletal:  Left index finger with distal amputation, through proximal nail and nail bed, somewhat tangential orientation.  Viable distal flap.  Intact DIP and IP flexion left ring finger.  Neurological: He is alert and oriented to person, place, and time. No cranial nerve deficit or sensory deficit. He exhibits normal muscle tone.  Coordination normal.  Skin: Skin is warm, dry and intact.  Psychiatric: He has a normal mood and affect. His behavior is normal. Judgment and thought content normal.  Nursing note and vitals reviewed.    ED Treatments / Results  Labs (all labs ordered are listed, but only abnormal results are displayed) Labs Reviewed - No data to display  EKG  EKG Interpretation None       Radiology Dg Finger Ring Left  Result Date: 01/01/2018 CLINICAL DATA:  Cut left ring finger with saw EXAM: LEFT RING FINGER 2+V COMPARISON:  None. FINDINGS: There is a soft tissue defect and amputation of the tip of the distal phalanx of the left ring finger. No subluxation or dislocation. IMPRESSION: Amputation of the tip of the distal phalanx of left ring finger. Electronically Signed   By: Rolm Baptise M.D.   On: 01/01/2018 15:57    Procedures Procedures (including critical care time)  Medications Ordered in ED Medications - No data to display   Initial Impression / Assessment and Plan / ED Course  I have reviewed the triage vital signs and the nursing notes.  Pertinent labs & imaging results that were available during my care of the patient were reviewed by me and considered in my medical decision making (see chart for details).  Clinical Course as of Jan 01 1726  Sat Jan 01, 2018  1702 Discussed with orthopedics, Dr. Caralyn Guile, who requested the patient go to the [facility] emergency department now, and he will post the patient for a procedure in the operating room ASAP.  Patient is to stay n.p.o.  [EW]    Clinical Course User Index [EW] Daleen Bo, MD     Patient Vitals for the past 24 hrs:  BP Temp Temp src Pulse Resp SpO2  01/01/18 1527 (!) 144/71 98.1 F (36.7 C) Oral 60 18 100 %    5:30 PM Reevaluation with update and discussion. After initial assessment and treatment, an updated evaluation reveals he remains comfortable, and understands plan.  Wife informed and agrees to take him  to [facility] hospital for further treatment. Daleen Bo      Final Clinical Impressions(s) / ED Diagnoses   Final diagnoses:  Open displaced fracture of distal phalanx of right ring finger, initial encounter  Laceration of right ring finger without foreign body with damage to nail, initial encounter    Left ring finger distal amputation, open fracture with laceration.  Patient likely needs bone rongeur and closure over fracture.  Care transferred to orthopedic hand specialist for disposition.  Nursing Notes Reviewed/ Care Coordinated Applicable Imaging Reviewed Interpretation of Laboratory Data incorporated into ED treatment   Plan-transfer by private vehicle  to [facility]  ED Discharge Orders    None       Daleen Bo, MD 01/01/18 [phone]

## 2018-01-01 NOTE — Anesthesia Preprocedure Evaluation (Signed)
Anesthesia Evaluation  Patient identified by MRN, date of birth, ID band Patient awake    Reviewed: Allergy & Precautions, NPO status , Patient's Chart, lab work & pertinent test results  History of Anesthesia Complications Negative for: history of anesthetic complications  Airway Mallampati: IV  TM Distance: >3 FB Neck ROM: Limited    Dental  (+) Missing, Partial Upper   Pulmonary neg pulmonary ROS,    breath sounds clear to auscultation       Cardiovascular hypertension, (-) angina(-) Past MI and (-) CHF  Rhythm:Regular     Neuro/Psych negative neurological ROS  negative psych ROS   GI/Hepatic negative GI ROS, Neg liver ROS,   Endo/Other  Hypothyroidism   Renal/GU negative Renal ROS     Musculoskeletal   Abdominal   Peds  Hematology negative hematology ROS (+)   Anesthesia Other Findings Neck radiation for h/o tonsillar Ca  Reproductive/Obstetrics                             Anesthesia Physical Anesthesia Plan  ASA: III  Anesthesia Plan: MAC   Post-op Pain Management:    Induction:   PONV Risk Score and Plan: 1 and Treatment may vary due to age or medical condition  Airway Management Planned: Nasal Cannula  Additional Equipment: None  Intra-op Plan:   Post-operative Plan:   Informed Consent: I have reviewed the patients History and Physical, chart, labs and discussed the procedure including the risks, benefits and alternatives for the proposed anesthesia with the patient or authorized representative who has indicated his/her understanding and acceptance.   Dental advisory given  Plan Discussed with: CRNA and Surgeon  Anesthesia Plan Comments:         Anesthesia Quick Evaluation

## 2018-01-01 NOTE — H&P (Signed)
Gregory Jenkins is an 67 y.o. male.   Chief Complaint: Left ring finger injury HPI: The patient complains of injury to left ring finger, by a joiner, just prior to arrival.  No other injury.  He states his last tetanus booster was 3 years ago.  He denies headache, nausea, vomiting, weakness or dizziness.  He last ate at 1 PM.  He is right-handed.  There are no other known modifying factors.    Past Medical History:  Diagnosis Date  . Abdominal pain 03/19/2016  . Allergic rhinitis   . Clotting disorder (HCC)    PE, suspect lupus anticoagulant  . Drug-induced skin rash 12/25/2015  . History of nephrolithiasis   . History of pulmonary embolism   . History of radiation therapy 12/17/2015- 02/03/16   Left Tonsil and Bilateral Neck  . Hypertension   . Mucositis oral 01/08/2016  . Thyroid disease   . Tonsil cancer [facility])     Past Surgical History:  Procedure Laterality Date  . EYE SURGERY Right 1980's   H/O right orbital "blowout" fracture  . FOOT SURGERY Left    [facility] Neuroma  . GASTROSTOMY TUBE PLACEMENT    . IR GASTROSTOMY TUBE REMOVAL    . IR GENERIC HISTORICAL  10/12/2016   IR REMOVAL TUN ACCESS W/ PORT W/O FL MOD SED 10/12/2016 Gregory Mckusick, DO [facility]INTER[facility]  . KNEE SURGERY Right    Arthroscopic  . PORTA CATH INSERTION      Family History  Problem Relation Age of Onset  . Cancer Father        esophageal ca  . Cancer Maternal Grandmother        brain ca  . Cancer Maternal Grandfather        lung ca  . COPD Mother   . Cancer Maternal Aunt        breast ca   Social History:  reports that  has never smoked. He quit smokeless tobacco use about 11 years ago. His smokeless tobacco use included chew. He reports that he drinks alcohol. He reports that he does not use drugs.  Allergies: No Known Allergies  Medications Prior to Admission  Medication Sig Dispense Refill  . aspirin EC 81 MG tablet Take 81 mg by mouth daily.    Marland Kitchen ibuprofen (ADVIL,MOTRIN) 200 MG tablet Take 200 mg  by mouth as needed. Reported on 01/27/2016    . levothyroxine (SYNTHROID, LEVOTHROID) 75 MCG tablet Take 75 mcg by mouth daily before breakfast.    . lidocaine (XYLOCAINE) 2 % solution Mix 1 part 2%viscous lidocaine,1part H2O.Swish and/or swallow 31mL of this mixture,70min before meals and at bedtime, up to QID (Patient not taking: Reported on 10/02/2016) 100 mL 5    No results found for this or any previous visit (from the past 48 hour(s)). Dg Finger Ring Left  Result Date: 01/01/2018 CLINICAL DATA:  Cut left ring finger with saw EXAM: LEFT RING FINGER 2+V COMPARISON:  None. FINDINGS: There is a soft tissue defect and amputation of the tip of the distal phalanx of the left ring finger. No subluxation or dislocation. IMPRESSION: Amputation of the tip of the distal phalanx of left ring finger. Electronically Signed   By: Rolm Baptise M.D.   On: 01/01/2018 15:57    ROS AS ABOVE  Blood pressure 110/74, pulse 65, temperature 98.1 F (36.7 C), temperature source Oral, resp. rate 17, height 5' 11.5" (1.816 m), weight 180 lb (81.6 kg), SpO2 97 %. Physical Exam  General Appearance:  Alert, cooperative, no distress, appears stated age  Head:  Normocephalic, without obvious abnormality, atraumatic  Eyes:  Pupils equal, conjunctiva/corneas clear,         Throat:   Neck: N     Lungs:   respirations unlabored  Chest Wall:  No tenderness or deformity  Heart:  Regular rate and rhythm,  Abdomen:   Soft, non-tender,         Extremities: LEFT RING FINGER IN DRESSING, NO WOUNDS TO LONG/INDEX/SMALL AND THUMB GOOD WRIST AND DIGITAL MOTION  Pulses: 2+ and symmetric  Skin: Skin color, texture, turgor normal, no rashes or lesions     Neurologic: Normal    Assessment/Plan LEFT RING FINGER OPEN DISTAL PHALANX FRACTURE AND AMPUTATION  LEFT RING FINGER DEBRIDEMENT AND REVISION AMPUTATION  R/B/A DISCUSSED WITH PT IN HOSPITAL.  PT VOICED UNDERSTANDING OF PLAN CONSENT SIGNED DAY OF SURGERY PT SEEN AND  EXAMINED PRIOR TO OPERATIVE PROCEDURE/DAY OF SURGERY SITE MARKED. QUESTIONS ANSWERED WILL GO HOME FOLLOWING SURGERY  WE ARE PLANNING SURGERY FOR YOUR UPPER EXTREMITY. THE RISKS AND BENEFITS OF SURGERY INCLUDE BUT NOT LIMITED TO BLEEDING INFECTION, DAMAGE TO NEARBY NERVES ARTERIES TENDONS, FAILURE OF SURGERY TO ACCOMPLISH ITS INTENDED GOALS, PERSISTENT SYMPTOMS AND NEED FOR FURTHER SURGICAL INTERVENTION. WITH THIS IN MIND WE WILL PROCEED. I HAVE DISCUSSED WITH THE PATIENT THE PRE AND POSTOPERATIVE REGIMEN AND THE DOS AND DON'TS. PT VOICED UNDERSTANDING AND INFORMED CONSENT SIGNED.  Linna Hoff 01/01/2018, 6:38 PM

## 2018-01-01 NOTE — ED Triage Notes (Signed)
Pt reports cut left ring finger on saw. Tip of finger is appears to be cut off. Wrapped in gauze and [place].

## 2018-01-02 ENCOUNTER — Encounter (HOSPITAL_COMMUNITY): Payer: Self-pay | Admitting: Orthopedic Surgery

## 2018-01-02 NOTE — Anesthesia Postprocedure Evaluation (Signed)
Anesthesia Post Note  Patient: Gregory Jenkins  Procedure(s) Performed: REVISION AMPUTATION RING FINGER (Left Finger)     Patient location during evaluation: PACU Anesthesia Type: MAC Level of consciousness: awake and alert Pain management: pain level controlled Vital Signs Assessment: post-procedure vital signs reviewed and stable Respiratory status: spontaneous breathing, nonlabored ventilation, respiratory function stable and patient connected to nasal cannula oxygen Cardiovascular status: stable and blood pressure returned to baseline Postop Assessment: no apparent nausea or vomiting Anesthetic complications: no    Last Vitals:  Vitals:   01/01/18 1945 01/01/18 2000  BP: 116/67 129/77  Pulse: (!) 57 62  Resp: 11 15  Temp:  36.5 C  SpO2: 100% 100%    Last Pain:  Vitals:   01/01/18 2000  TempSrc:   PainSc: 0-No pain                 Kimberly Coye

## 2018-03-31 ENCOUNTER — Other Ambulatory Visit: Payer: Self-pay | Admitting: Hematology and Oncology

## 2018-03-31 DIAGNOSIS — E079 Disorder of thyroid, unspecified: Secondary | ICD-10-CM

## 2018-03-31 DIAGNOSIS — C090 Malignant neoplasm of tonsillar fossa: Secondary | ICD-10-CM

## 2018-04-01 ENCOUNTER — Encounter: Payer: Self-pay | Admitting: Hematology and Oncology

## 2018-04-01 ENCOUNTER — Inpatient Hospital Stay: Payer: 59

## 2018-04-01 ENCOUNTER — Telehealth: Payer: Self-pay

## 2018-04-01 ENCOUNTER — Inpatient Hospital Stay: Payer: 59 | Attending: Hematology and Oncology | Admitting: Hematology and Oncology

## 2018-04-01 DIAGNOSIS — E079 Disorder of thyroid, unspecified: Secondary | ICD-10-CM

## 2018-04-01 DIAGNOSIS — R946 Abnormal results of thyroid function studies: Secondary | ICD-10-CM | POA: Insufficient documentation

## 2018-04-01 DIAGNOSIS — C090 Malignant neoplasm of tonsillar fossa: Secondary | ICD-10-CM | POA: Diagnosis not present

## 2018-04-01 LAB — CBC WITH DIFFERENTIAL/PLATELET
BASOS ABS: 0 10*3/uL (ref 0.0–0.1)
Basophils Relative: 1 %
Eosinophils Absolute: 0.1 10*3/uL (ref 0.0–0.5)
Eosinophils Relative: 2 %
HEMATOCRIT: 41.3 % (ref 38.4–49.9)
Hemoglobin: 13.9 g/dL (ref 13.0–17.1)
LYMPHS PCT: 17 %
Lymphs Abs: 0.5 10*3/uL — ABNORMAL LOW (ref 0.9–3.3)
MCH: 32 pg (ref 27.2–33.4)
MCHC: 33.8 g/dL (ref 32.0–36.0)
MCV: 94.7 fL (ref 79.3–98.0)
Monocytes Absolute: 0.4 10*3/uL (ref 0.1–0.9)
Monocytes Relative: 13 %
NEUTROS ABS: 2 10*3/uL (ref 1.5–6.5)
Neutrophils Relative %: 67 %
Platelets: 164 10*3/uL (ref 140–400)
RBC: 4.36 MIL/uL (ref 4.20–5.82)
RDW: 14 % (ref 11.0–14.6)
WBC: 2.9 10*3/uL — AB (ref 4.0–10.3)

## 2018-04-01 LAB — COMPREHENSIVE METABOLIC PANEL
ALT: 13 U/L (ref 0–55)
AST: 19 U/L (ref 5–34)
Albumin: 3.8 g/dL (ref 3.5–5.0)
Alkaline Phosphatase: 74 U/L (ref 40–150)
Anion gap: 8 (ref 3–11)
BILIRUBIN TOTAL: 0.6 mg/dL (ref 0.2–1.2)
BUN: 18 mg/dL (ref 7–26)
CHLORIDE: 104 mmol/L (ref 98–109)
CO2: 28 mmol/L (ref 22–29)
CREATININE: 1.15 mg/dL (ref 0.70–1.30)
Calcium: 9.2 mg/dL (ref 8.4–10.4)
GFR calc Af Amer: >60 mL/min (ref >60)
Glucose, Bld: 101 mg/dL (ref 70–140)
Potassium: 4.6 mmol/L (ref 3.5–5.1)
Sodium: 140 mmol/L (ref 136–145)
TOTAL PROTEIN: 6.9 g/dL (ref 6.4–8.3)

## 2018-04-01 LAB — TSH: TSH: 5.731 u[IU]/mL — AB (ref 0.320–4.118)

## 2018-04-01 NOTE — Progress Notes (Signed)
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 I reinforced importance of close follow-up with ENT I recommend the patient to refrain from alcohol intake  Thyroid disease His TSH is mildly elevated I recommend he consult with his primary care doctor to see if further dose adjustment of his thyroid medication is needed   No orders of the defined types were placed in this encounter.   INTERVAL HISTORY: Please see below for problem oriented charting. 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 Mild asymmetry of the left tonsil -- correlate with direct visualization to assess for primary neoplasm.      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      11/28/2015 PET scan    1. Left palatine tonsil primary with left worse than right cervical nodal metastasis. 2. No extracervical hypermetabolic metastasis.       12/12/2015 Procedure    Peg and port-a-cath placement.      12/18/2015 - 01/29/2016 Chemotherapy    He received weekly cetuximab      12/18/2015 - 02/03/2016 Radiation Therapy    Received Helicle IMRT Tomotherapy:  Left tonsil and bilateral neck / 70 Gy in 35 fractions to gross disease, 63 Gy in 35 fractions to high risk nodal echelons, and 56 Gy in 35 fractions to intermediate risk nodal echelons.      12/31/2015 Adverse Reaction    Treatment #3 is placed on hold due to worsening mucositis      01/08/2016 Miscellaneous    Cetuximab #3 resumed with 50% dose adjustment      01/14/2016 Adverse Reaction    Treatment #4 is placed on hold due to worsening mucositis and failure to thrive      03/20/2016 Imaging    CT abdomen showed mild gallbladder wall thickening with pericholecystic fluid and several gallstones. The gallbladder is nondistended. Recommend clinical correlation for acute or chronic cholecystitis.      04/15/2016 Procedure    PEG removed.      05/26/2016 Imaging    Restaging PET:  Marked interval improvement with resolution of left tonsillar hypermetabolism and right cervical lymph node hypermetabolism. Left cervical lymph nodes have decreased substantially in size and there is a subtle degree of FDG hypermetabolism still associated with  these left-sided cervical nodes.      10/12/2016 Procedure    Port-a-cath removed.       REVIEW OF SYSTEMS:   Constitutional: Denies fevers, chills or abnormal weight loss Eyes: Denies blurriness of vision Ears, nose, mouth, throat, and face: Denies mucositis or sore throat Respiratory: Denies cough, dyspnea or wheezes Cardiovascular: Denies palpitation, chest discomfort or lower extremity swelling Gastrointestinal:  Denies nausea, heartburn or  change in bowel habits Skin: Denies abnormal skin rashes Lymphatics: Denies new lymphadenopathy or easy bruising Neurological:Denies numbness, tingling or new weaknesses Behavioral/Psych: Mood is stable, no new changes  All other systems were reviewed with the patient and are negative.  I have reviewed the past medical history, past surgical history, social history and family history with the patient and they are unchanged from previous note.  ALLERGIES:  has No Known Allergies.  MEDICATIONS:  Current Outpatient Medications  Medication Sig Dispense Refill  . aspirin EC 81 MG tablet Take 81 mg by mouth daily.    Marland Kitchen ibuprofen (ADVIL,MOTRIN) 200 MG tablet Take 200 mg by mouth as needed. Reported on 01/27/2016    . levothyroxine (SYNTHROID, LEVOTHROID) 100 MCG tablet Take 100 mcg by mouth daily.  3   No current facility-administered medications for this visit.     PHYSICAL EXAMINATION: ECOG PERFORMANCE STATUS: 1 - Symptomatic but completely ambulatory  Vitals:   04/01/18 0902  BP: 111/71  Pulse: (!) 56  Resp: 18  Temp: 98.5 F (36.9 C)  SpO2: 100%   Filed Weights   04/01/18 0902  Weight: 174 lb (78.9 kg)    GENERAL:alert, no distress and comfortable SKIN: skin color, texture, turgor are normal, no rashes or significant lesions EYES: normal, Conjunctiva are pink and non-injected, sclera clear OROPHARYNX:no exudate, no erythema and lips, buccal mucosa, and tongue normal  NECK: supple, thyroid normal size, non-tender, without nodularity LYMPH:  no palpable lymphadenopathy in the cervical, axillary or inguinal LUNGS: clear to auscultation and percussion with normal breathing effort HEART: regular rate & rhythm and no murmurs and no lower extremity edema ABDOMEN:abdomen soft, non-tender and normal bowel sounds Musculoskeletal:no cyanosis of digits and no clubbing  NEURO: alert & oriented x 3 with fluent speech, no focal motor/sensory deficits  LABORATORY DATA:  I have reviewed  the data as listed    Component Value Date/Time   NA 140 04/01/2018 0820   NA 139 05/26/2016 1111   K 4.6 04/01/2018 0820   K 4.5 05/26/2016 1111   CL 104 04/01/2018 0820   CO2 28 04/01/2018 0820   CO2 28 05/26/2016 1111   GLUCOSE 101 04/01/2018 0820   GLUCOSE 97 05/26/2016 1111   BUN 18 04/01/2018 0820   BUN 18.8 10/01/2016 0956   CREATININE 1.15 04/01/2018 0820   CREATININE 1.0 10/01/2016 0956   CALCIUM 9.2 04/01/2018 0820   CALCIUM 9.9 05/26/2016 1111   PROT 6.9 04/01/2018 0820   PROT 7.2 05/26/2016 1111   ALBUMIN 3.8 04/01/2018 0820   ALBUMIN 3.7 05/26/2016 1111   AST 19 04/01/2018 0820   AST 16 05/26/2016 1111   ALT 13 04/01/2018 0820   ALT 15 05/26/2016 1111   ALKPHOS 74 04/01/2018 0820   ALKPHOS 81 05/26/2016 1111   BILITOT 0.6 04/01/2018 0820   BILITOT 0.74 05/26/2016 1111   GFRNONAA >60 04/01/2018 0820   GFRAA >60 04/01/2018 0820    No results found for: SPEP, UPEP  Lab Results  Component Value Date   WBC 2.9 (L) 04/01/2018   NEUTROABS 2.0  04/01/2018   HGB 13.9 04/01/2018   HCT 41.3 04/01/2018   MCV 94.7 04/01/2018   PLT 164 04/01/2018      Chemistry      Component Value Date/Time   NA 140 04/01/2018 0820   NA 139 05/26/2016 1111   K 4.6 04/01/2018 0820   K 4.5 05/26/2016 1111   CL 104 04/01/2018 0820   CO2 28 04/01/2018 0820   CO2 28 05/26/2016 1111   BUN 18 04/01/2018 0820   BUN 18.8 10/01/2016 0956   CREATININE 1.15 04/01/2018 0820   CREATININE 1.0 10/01/2016 0956      Component Value Date/Time   CALCIUM 9.2 04/01/2018 0820   CALCIUM 9.9 05/26/2016 1111   ALKPHOS 74 04/01/2018 0820   ALKPHOS 81 05/26/2016 1111   AST 19 04/01/2018 0820   AST 16 05/26/2016 1111   ALT 13 04/01/2018 0820   ALT 15 05/26/2016 1111   BILITOT 0.6 04/01/2018 0820   BILITOT 0.74 05/26/2016 1111      All questions were answered. The patient knows to call the clinic with any problems, questions or concerns. No barriers to learning was detected.  I spent 10  minutes counseling the patient face to face. The total time spent in the appointment was 15 minutes and more than 50% was on counseling and review of test results  Heath Lark, MD 04/01/2018 11:13 AM

## 2018-04-01 NOTE — Assessment & Plan Note (Addendum)
Clinically, he has no signs of disease He will continue to see me once a year. I reinforced importance of close follow-up with ENT I recommend the patient to refrain from alcohol intake

## 2018-04-01 NOTE — Assessment & Plan Note (Signed)
His TSH is mildly elevated I recommend he consult with his primary care doctor to see if further dose adjustment of his thyroid medication is needed

## 2018-04-01 NOTE — Telephone Encounter (Signed)
-----   Message from Heath Lark, MD sent at 04/01/2018 10:00 AM EDT ----- Regarding: TSH pls let patient know TSH is high Not sure how long his thyroid medicine was recently adjusted Please tell him to call Dr. Eddie Dibbles office for instructions and also fax a copy of labs to his office Thanks ----- Message ----- From: Buel Ream, Lab In [place] Sent: 04/01/2018   8:36 AM To: Heath Lark, MD

## 2018-04-01 NOTE — Telephone Encounter (Signed)
Called with below message. Verbalized understanding.  Faxed TSH results to Dr. Eddie Dibbles office at [phone].

## 2018-04-04 ENCOUNTER — Telehealth: Payer: Self-pay | Admitting: Hematology and Oncology

## 2018-04-04 NOTE — Telephone Encounter (Signed)
Mailed patient calendar of  upcoming June 2020 appointments.

## 2019-01-03 ENCOUNTER — Telehealth: Payer: Self-pay

## 2019-01-03 NOTE — Telephone Encounter (Signed)
Pt's wife called to report pt has a new lump on the right side of his neck about the size of a quarter.  He just noticed in the past few days.  No other symptoms to report.  He has made an appt to see his ENT on 3/13 to have it assessed. Wife wants to know what Dr Alvy Bimler suggest.  Msg has been sent to Dr Alvy Bimler.

## 2019-02-09 ENCOUNTER — Other Ambulatory Visit: Payer: Self-pay | Admitting: Otolaryngology

## 2019-02-09 DIAGNOSIS — C099 Malignant neoplasm of tonsil, unspecified: Secondary | ICD-10-CM

## 2019-02-09 DIAGNOSIS — R221 Localized swelling, mass and lump, neck: Secondary | ICD-10-CM

## 2019-02-13 ENCOUNTER — Other Ambulatory Visit: Payer: Self-pay

## 2019-02-13 ENCOUNTER — Ambulatory Visit
Admission: RE | Admit: 2019-02-13 | Discharge: 2019-02-13 | Disposition: A | Discharge status: Home / Self Care | Payer: 59 | Source: Ambulatory Visit | Attending: Otolaryngology | Admitting: Otolaryngology

## 2019-02-13 DIAGNOSIS — R221 Localized swelling, mass and lump, neck: Secondary | ICD-10-CM

## 2019-02-13 DIAGNOSIS — C099 Malignant neoplasm of tonsil, unspecified: Secondary | ICD-10-CM

## 2019-02-13 MED ORDER — IOPAMIDOL (ISOVUE-300) INJECTION 61%
75.0000 mL | Freq: Once | INTRAVENOUS | Status: AC | PRN
  Administered 2019-02-13: 75 mL via INTRAVENOUS

## 2019-04-07 ENCOUNTER — Encounter: Payer: Self-pay | Admitting: Hematology and Oncology

## 2019-04-07 ENCOUNTER — Inpatient Hospital Stay: Payer: 59 | Attending: Hematology and Oncology

## 2019-04-07 ENCOUNTER — Other Ambulatory Visit: Payer: Self-pay

## 2019-04-07 ENCOUNTER — Inpatient Hospital Stay (HOSPITAL_BASED_OUTPATIENT_CLINIC_OR_DEPARTMENT_OTHER): Payer: 59 | Admitting: Hematology and Oncology

## 2019-04-07 ENCOUNTER — Other Ambulatory Visit: Payer: Self-pay | Admitting: Hematology and Oncology

## 2019-04-07 DIAGNOSIS — K117 Disturbances of salivary secretion: Secondary | ICD-10-CM | POA: Insufficient documentation

## 2019-04-07 DIAGNOSIS — E079 Disorder of thyroid, unspecified: Secondary | ICD-10-CM

## 2019-04-07 DIAGNOSIS — C090 Malignant neoplasm of tonsillar fossa: Secondary | ICD-10-CM

## 2019-04-07 LAB — CBC WITH DIFFERENTIAL/PLATELET
Abs Immature Granulocytes: 0.01 10*3/uL (ref 0.00–0.07)
Basophils Absolute: 0 10*3/uL (ref 0.0–0.1)
Basophils Relative: 0 %
Eosinophils Absolute: 0.1 10*3/uL (ref 0.0–0.5)
Eosinophils Relative: 2 %
HCT: 41 % (ref 39.0–52.0)
Hemoglobin: 13.3 g/dL (ref 13.0–17.0)
Immature Granulocytes: 0 %
Lymphocytes Relative: 14 %
Lymphs Abs: 0.5 10*3/uL — ABNORMAL LOW (ref 0.7–4.0)
MCH: 31.5 pg (ref 26.0–34.0)
MCHC: 32.4 g/dL (ref 30.0–36.0)
MCV: 97.2 fL (ref 80.0–100.0)
Monocytes Absolute: 0.3 10*3/uL (ref 0.1–1.0)
Monocytes Relative: 10 %
Neutro Abs: 2.5 10*3/uL (ref 1.7–7.7)
Neutrophils Relative %: 74 %
Platelets: 165 10*3/uL (ref 150–400)
RBC: 4.22 MIL/uL (ref 4.22–5.81)
RDW: 12.8 % (ref 11.5–15.5)
WBC: 3.4 10*3/uL — ABNORMAL LOW (ref 4.0–10.5)
nRBC: 0 % (ref 0.0–0.2)

## 2019-04-07 LAB — COMPREHENSIVE METABOLIC PANEL
ALT: 16 U/L (ref 0–44)
AST: 16 U/L (ref 15–41)
Albumin: 3.5 g/dL (ref 3.5–5.0)
Alkaline Phosphatase: 70 U/L (ref 38–126)
Anion gap: 6 (ref 5–15)
BUN: 18 mg/dL (ref 8–23)
CO2: 26 mmol/L (ref 22–32)
Calcium: 9 mg/dL (ref 8.9–10.3)
Chloride: 108 mmol/L (ref 98–111)
Creatinine, Ser: 1.03 mg/dL (ref 0.61–1.24)
GFR calc Af Amer: >60 mL/min (ref >60)
GFR calc non Af Amer: >60 mL/min (ref >60)
Glucose, Bld: 108 mg/dL — ABNORMAL HIGH (ref 70–99)
Potassium: 4.3 mmol/L (ref 3.5–5.1)
Sodium: 140 mmol/L (ref 135–145)
Total Bilirubin: 0.4 mg/dL (ref 0.3–1.2)
Total Protein: 6.6 g/dL (ref 6.5–8.1)

## 2019-04-07 LAB — TSH: TSH: 1.222 u[IU]/mL (ref 0.320–4.118)

## 2019-04-07 NOTE — Progress Notes (Signed)
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   INTERVAL HISTORY: Please see below for problem oriented charting. 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 Mild asymmetry of the left tonsil -- correlate with direct visualization to assess for primary neoplasm.   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   11/28/2015 PET scan    1. Left palatine tonsil primary with left worse than right cervical nodal metastasis. 2. No extracervical hypermetabolic metastasis.    12/12/2015 Procedure   Peg and port-a-cath placement.   12/18/2015 - 01/29/2016 Chemotherapy   He received weekly cetuximab   12/18/2015 - 02/03/2016 Radiation Therapy   Received Helicle IMRT Tomotherapy:  Left tonsil and bilateral neck / 70 Gy in 35 fractions to gross disease, 63 Gy in 35 fractions to high risk nodal echelons, and 56 Gy in 35 fractions to intermediate risk nodal echelons.   12/31/2015 Adverse Reaction   Treatment #3 is placed on hold due to worsening mucositis   01/08/2016 Miscellaneous   Cetuximab #3 resumed with 50% dose adjustment   01/14/2016 Adverse Reaction   Treatment #4 is placed on hold due to worsening mucositis and failure to thrive   03/20/2016 Imaging   CT abdomen showed mild gallbladder wall thickening with pericholecystic fluid and several gallstones. The gallbladder is nondistended. Recommend clinical correlation for acute or chronic cholecystitis.   04/15/2016 Procedure   PEG removed.   05/26/2016 Imaging   Restaging PET:  Marked interval improvement with resolution of left tonsillar hypermetabolism and right cervical lymph node hypermetabolism. Left cervical lymph nodes have decreased substantially in size and there is a subtle degree of FDG hypermetabolism still associated with these left-sided cervical nodes.   10/12/2016 Procedure   Port-a-cath removed.   02/14/2019 Imaging   CT neck 1. Satisfactory post treatment appearance of the neck. Zollie Clemence-Rads category 1. 2. No mass or abnormality to correspond to the marked area of concern at the level of the right submandibular gland.       REVIEW OF SYSTEMS:  Constitutional: Denies fevers, chills or abnormal weight loss Eyes: Denies blurriness of vision Ears, nose, mouth, throat, and face: Denies mucositis or sore throat Respiratory: Denies cough, dyspnea or  wheezes Cardiovascular: Denies palpitation, chest discomfort or lower extremity swelling Gastrointestinal:  Denies nausea, heartburn or change in bowel habits Skin: Denies abnormal skin rashes Lymphatics: Denies new lymphadenopathy or easy bruising Neurological:Denies numbness, tingling or new weaknesses Behavioral/Psych: Mood is stable, no new changes  All other systems were reviewed with the patient and are negative.  I have reviewed the past medical history, past surgical history, social history and family history with the patient and they are unchanged from previous note.  ALLERGIES:  has No Known Allergies.  MEDICATIONS:  Current Outpatient Medications  Medication Sig Dispense Refill  . ipratropium (ATROVENT) 0.06 % nasal spray Place 2 sprays into both nostrils every morning.    Marland Kitchen aspirin EC 81 MG tablet Take 81 mg by mouth daily.    Marland Kitchen ibuprofen (ADVIL,MOTRIN) 200 MG tablet Take 200 mg by mouth as needed. Reported on 01/27/2016    . levothyroxine (SYNTHROID) 125 MCG tablet Take 125 mcg by mouth daily.   3   No current facility-administered medications for this visit.     PHYSICAL EXAMINATION: ECOG PERFORMANCE STATUS: 1 - Symptomatic but completely ambulatory  Vitals:   04/07/19 0924  BP: 115/60  Pulse: (!) 54  Resp: 18  Temp: 98.2 F (36.8 C)  SpO2: 100%   Filed Weights   04/07/19 0924  Weight: 175 lb 6.4 oz (79.6 kg)    GENERAL:alert, no distress and comfortable SKIN: skin color, texture, turgor are normal, no rashes or significant lesions EYES: normal, Conjunctiva are pink and non-injected, sclera clear OROPHARYNX:no exudate, no erythema and lips, buccal mucosa, and tongue normal  NECK: supple, thyroid normal size, non-tender, without nodularity LYMPH:  no palpable lymphadenopathy in the cervical, axillary or inguinal LUNGS: clear to auscultation and percussion with normal breathing effort HEART: regular rate & rhythm and no murmurs and no lower extremity  edema ABDOMEN:abdomen soft, non-tender and normal bowel sounds Musculoskeletal:no cyanosis of digits and no clubbing  NEURO: alert & oriented x 3 with fluent speech, no focal motor/sensory deficits  LABORATORY DATA:  I have reviewed the data as listed    Component Value Date/Time   NA 140 04/07/2019 0859   NA 139 05/26/2016 1111   K 4.3 04/07/2019 0859   K 4.5 05/26/2016 1111   CL 108 04/07/2019 0859   CO2 26 04/07/2019 0859   CO2 28 05/26/2016 1111   GLUCOSE 108 (H) 04/07/2019 0859   GLUCOSE 97 05/26/2016 1111   BUN 18 04/07/2019 0859   BUN 18.8 10/01/2016 0956   CREATININE 1.03 04/07/2019 0859   CREATININE 1.0 10/01/2016 0956   CALCIUM 9.0 04/07/2019 0859   CALCIUM 9.9 05/26/2016 1111   PROT 6.6 04/07/2019 0859   PROT 7.2 05/26/2016 1111   ALBUMIN 3.5 04/07/2019 0859   ALBUMIN 3.7 05/26/2016 1111   AST 16 04/07/2019 0859   AST 16 05/26/2016 1111   ALT 16 04/07/2019 0859   ALT 15 05/26/2016 1111   ALKPHOS 70 04/07/2019 0859   ALKPHOS 81 05/26/2016 1111   BILITOT 0.4 04/07/2019 0859   BILITOT 0.74 05/26/2016 1111   GFRNONAA >60 04/07/2019 0859   GFRAA >60 04/07/2019 0859    No results found for: SPEP, UPEP  Lab Results  Component Value Date   WBC 3.4 (L) 04/07/2019   NEUTROABS 2.5 04/07/2019   HGB 13.3 04/07/2019  HCT 41.0 04/07/2019   MCV 97.2 04/07/2019   PLT 165 04/07/2019      Chemistry      Component Value Date/Time   NA 140 04/07/2019 0859   NA 139 05/26/2016 1111   K 4.3 04/07/2019 0859   K 4.5 05/26/2016 1111   CL 108 04/07/2019 0859   CO2 26 04/07/2019 0859   CO2 28 05/26/2016 1111   BUN 18 04/07/2019 0859   BUN 18.8 10/01/2016 0956   CREATININE 1.03 04/07/2019 0859   CREATININE 1.0 10/01/2016 0956      Component Value Date/Time   CALCIUM 9.0 04/07/2019 0859   CALCIUM 9.9 05/26/2016 1111   ALKPHOS 70 04/07/2019 0859   ALKPHOS 81 05/26/2016 1111   AST 16 04/07/2019 0859   AST 16 05/26/2016 1111   ALT 16 04/07/2019 0859   ALT 15  05/26/2016 1111   BILITOT 0.4 04/07/2019 0859   BILITOT 0.74 05/26/2016 1111     I have reviewed the CT imaging from April myself  All questions were answered. The patient knows to call the clinic with any problems, questions or concerns. No barriers to learning was detected.  I spent 15 minutes counseling the patient face to face. The total time spent in the appointment was 20 minutes and more than 50% was on counseling and review of test results  Heath Lark, MD 04/07/2019 11:01 AM

## 2019-04-07 NOTE — Assessment & Plan Note (Signed)
His TSH is satisfactory He will continue TSH monitoring and thyroid medicine adjustment through his primary care doctor

## 2019-04-07 NOTE — Assessment & Plan Note (Signed)
He has chronic dry mouth and as a result her poor dentition He will continue close follow-up with his dentist for further follow-up 

## 2019-04-07 NOTE — Assessment & Plan Note (Signed)
Clinically, he has no signs of cancer recurrence Recent CT imaging was reviewed He will continue close follow-up with ENT I will see him back in a year for further follow-up

## 2020-04-05 ENCOUNTER — Other Ambulatory Visit: Payer: Self-pay

## 2020-04-05 ENCOUNTER — Inpatient Hospital Stay: Payer: 59 | Attending: Hematology and Oncology | Admitting: Hematology and Oncology

## 2020-04-05 ENCOUNTER — Encounter: Payer: Self-pay | Admitting: Hematology and Oncology

## 2020-04-05 DIAGNOSIS — C090 Malignant neoplasm of tonsillar fossa: Secondary | ICD-10-CM | POA: Diagnosis present

## 2020-04-05 DIAGNOSIS — R252 Cramp and spasm: Secondary | ICD-10-CM | POA: Diagnosis not present

## 2020-04-05 DIAGNOSIS — K117 Disturbances of salivary secretion: Secondary | ICD-10-CM

## 2020-04-05 DIAGNOSIS — E079 Disorder of thyroid, unspecified: Secondary | ICD-10-CM | POA: Diagnosis not present

## 2020-04-05 DIAGNOSIS — Y842 Radiological procedure and radiotherapy as the cause of abnormal reaction of the patient, or of later complication, without mention of misadventure at the time of the procedure: Secondary | ICD-10-CM

## 2020-04-05 DIAGNOSIS — M5382 Other specified dorsopathies, cervical region: Secondary | ICD-10-CM

## 2020-04-05 NOTE — Assessment & Plan Note (Signed)
He has chronic dry mouth and as a result her poor dentition He will continue close follow-up with his dentist for further follow-up

## 2020-04-05 NOTE — Assessment & Plan Note (Signed)
He will continue TSH monitoring and thyroid medicine adjustment through his primary care doctor

## 2020-04-05 NOTE — Progress Notes (Signed)
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[place]) Clinically, he has no signs of cancer recurrence He will continue close follow-up with ENT I will see him back in a year for further follow-up We discussed the importance of staying abstinent from alcohol  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   All questions were answered. The patient knows to call the clinic with any problems, questions or concerns. The total time spent in the appointment was 20 minutes encounter with patients including review of chart and various tests results, discussions about plan of care and coordination of care plan   Heath Lark, MD 04/05/2020 4:14 PM  INTERVAL HISTORY: Please see below for problem oriented charting. 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 Mild asymmetry of the left tonsil -- correlate with direct visualization to assess for primary neoplasm.   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   11/28/2015 PET scan   1. Left palatine tonsil primary with left worse than right cervical nodal metastasis. 2. No extracervical hypermetabolic metastasis.    12/12/2015 Procedure   Peg and port-a-cath placement.   12/18/2015 - 01/29/2016 Chemotherapy   He received weekly cetuximab   12/18/2015 - 02/03/2016 Radiation Therapy   Received Helicle IMRT Tomotherapy:  Left tonsil and bilateral neck / 70 Gy in 35 fractions to gross disease, 63 Gy in 35 fractions to high risk nodal echelons, and 56 Gy in 35 fractions to intermediate risk nodal echelons.   12/31/2015 Adverse Reaction   Treatment #3 is placed on hold due to worsening mucositis   01/08/2016 Miscellaneous   Cetuximab #3 resumed with 50% dose adjustment   01/14/2016 Adverse Reaction   Treatment #4 is placed on hold due to worsening mucositis and failure to thrive   03/20/2016 Imaging   CT abdomen showed mild gallbladder wall thickening with pericholecystic fluid and several gallstones. The gallbladder is nondistended.  Recommend clinical correlation for acute or chronic cholecystitis.   04/15/2016 Procedure   PEG removed.   05/26/2016 Imaging   Restaging PET:  Marked interval improvement with  resolution of left tonsillar hypermetabolism and right cervical lymph node hypermetabolism. Left cervical lymph nodes have decreased substantially in size and there is a subtle degree of FDG hypermetabolism still associated with these left-sided cervical nodes.   10/12/2016 Procedure   Port-a-cath removed.   02/14/2019 Imaging   CT neck 1. Satisfactory post treatment appearance of the neck. Nora Sabey-Rads category 1. 2. No mass or abnormality to correspond to the marked area of concern at the level of the right submandibular gland.       REVIEW OF SYSTEMS:   Constitutional: Denies fevers, chills or abnormal weight loss Eyes: Denies blurriness of vision Ears, nose, mouth, throat, and face: Denies mucositis or sore throat Respiratory: Denies cough, dyspnea or wheezes Cardiovascular: Denies palpitation, chest discomfort or lower extremity swelling Gastrointestinal:  Denies nausea, heartburn or change in bowel habits Skin: Denies abnormal skin rashes Lymphatics: Denies new lymphadenopathy or easy bruising Neurological:Denies numbness, tingling or new weaknesses Behavioral/Psych: Mood is stable, no new changes  All other systems were reviewed with the patient and are negative.  I have reviewed the past medical history, past surgical history, social history and family history with the patient and they are unchanged from previous note.  ALLERGIES:  has No Known Allergies.  MEDICATIONS:  Current Outpatient Medications  Medication Sig Dispense Refill  . aspirin EC 81 MG tablet Take 81 mg by mouth daily.    Marland Kitchen ibuprofen (ADVIL,MOTRIN) 200 MG tablet Take 200 mg by mouth as needed. Reported on 01/27/2016    . ipratropium (ATROVENT) 0.06 % nasal spray Place 2 sprays into both nostrils every morning.    Marland Kitchen levothyroxine (SYNTHROID) 125 MCG tablet Take 125 mcg by mouth daily.   3   No current facility-administered medications for this visit.    PHYSICAL EXAMINATION: ECOG PERFORMANCE STATUS: 1 -  Symptomatic but completely ambulatory  Vitals:   04/05/20 0913  BP: 118/65  Pulse: (!) 18  Temp: 98 F (36.7 C)  SpO2: 100%   Filed Weights   04/05/20 0913  Weight: 184 lb (83.5 kg)    GENERAL:alert, no distress and comfortable SKIN: skin color, texture, turgor are normal, no rashes or significant lesions EYES: normal, Conjunctiva are pink and non-injected, sclera clear OROPHARYNX:no exudate, no erythema and lips, buccal mucosa, and tongue normal.  Poor dentition is noted but no other abnormalities NECK: Noted some radiation induced neck fibrosis LYMPH:  no palpable lymphadenopathy in the cervical, axillary or inguinal LUNGS: clear to auscultation and percussion with normal breathing effort HEART: regular rate & rhythm and no murmurs and no lower extremity edema ABDOMEN:abdomen soft, non-tender and normal bowel sounds Musculoskeletal:no cyanosis of digits and no clubbing  NEURO: alert & oriented x 3 with fluent speech, no focal motor/sensory deficits  LABORATORY DATA:  I have reviewed the data as listed    Component Value Date/Time   NA 140 04/07/2019 0859   NA 139 05/26/2016 1111   K 4.3 04/07/2019 0859   K 4.5 05/26/2016 1111   CL 108 04/07/2019 0859   CO2 26 04/07/2019 0859   CO2 28 05/26/2016 1111   GLUCOSE 108 (H) 04/07/2019 0859   GLUCOSE 97 05/26/2016 1111   BUN 18 04/07/2019 0859   BUN 18.8 10/01/2016 0956   CREATININE 1.03 04/07/2019 0859   CREATININE 1.0 10/01/2016 0956   CALCIUM  9.0 04/07/2019 0859   CALCIUM 9.9 05/26/2016 1111   PROT 6.6 04/07/2019 0859   PROT 7.2 05/26/2016 1111   ALBUMIN 3.5 04/07/2019 0859   ALBUMIN 3.7 05/26/2016 1111   AST 16 04/07/2019 0859   AST 16 05/26/2016 1111   ALT 16 04/07/2019 0859   ALT 15 05/26/2016 1111   ALKPHOS 70 04/07/2019 0859   ALKPHOS 81 05/26/2016 1111   BILITOT 0.4 04/07/2019 0859   BILITOT 0.74 05/26/2016 1111   GFRNONAA >60 04/07/2019 0859   GFRAA >60 04/07/2019 0859    No results found for: SPEP,  UPEP  Lab Results  Component Value Date   WBC 3.4 (L) 04/07/2019   NEUTROABS 2.5 04/07/2019   HGB 13.3 04/07/2019   HCT 41.0 04/07/2019   MCV 97.2 04/07/2019   PLT 165 04/07/2019      Chemistry      Component Value Date/Time   NA 140 04/07/2019 0859   NA 139 05/26/2016 1111   K 4.3 04/07/2019 0859   K 4.5 05/26/2016 1111   CL 108 04/07/2019 0859   CO2 26 04/07/2019 0859   CO2 28 05/26/2016 1111   BUN 18 04/07/2019 0859   BUN 18.8 10/01/2016 0956   CREATININE 1.03 04/07/2019 0859   CREATININE 1.0 10/01/2016 0956      Component Value Date/Time   CALCIUM 9.0 04/07/2019 0859   CALCIUM 9.9 05/26/2016 1111   ALKPHOS 70 04/07/2019 0859   ALKPHOS 81 05/26/2016 1111   AST 16 04/07/2019 0859   AST 16 05/26/2016 1111   ALT 16 04/07/2019 0859   ALT 15 05/26/2016 1111   BILITOT 0.4 04/07/2019 0859   BILITOT 0.74 05/26/2016 1111

## 2020-04-05 NOTE — Assessment & Plan Note (Signed)
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

## 2020-04-05 NOTE — Assessment & Plan Note (Signed)
Clinically, he has no signs of cancer recurrence He will continue close follow-up with ENT I will see him back in a year for further follow-up We discussed the importance of staying abstinent from alcohol

## 2020-04-08 ENCOUNTER — Telehealth: Payer: Self-pay | Admitting: Hematology and Oncology

## 2020-04-08 NOTE — Telephone Encounter (Signed)
Scheduled per 6/11 sch message. Mailing calendar to pt.

## 2021-04-02 ENCOUNTER — Telehealth: Payer: Self-pay | Admitting: Hematology and Oncology

## 2021-04-02 NOTE — Telephone Encounter (Signed)
Per 6/6 sch msg, pt aware

## 2021-04-04 ENCOUNTER — Ambulatory Visit: Payer: 59 | Admitting: Hematology and Oncology

## 2021-04-14 ENCOUNTER — Telehealth: Payer: Self-pay | Admitting: Hematology and Oncology

## 2021-04-14 NOTE — Telephone Encounter (Signed)
R/s appt per 6/20 sch msg. Pt aware.  

## 2021-04-15 ENCOUNTER — Ambulatory Visit: Payer: 59 | Admitting: Hematology and Oncology

## 2021-05-05 ENCOUNTER — Encounter: Payer: Self-pay | Admitting: Hematology and Oncology

## 2021-05-05 ENCOUNTER — Other Ambulatory Visit: Payer: Self-pay

## 2021-05-05 ENCOUNTER — Inpatient Hospital Stay: Payer: 59 | Attending: Hematology and Oncology | Admitting: Hematology and Oncology

## 2021-05-05 DIAGNOSIS — Z923 Personal history of irradiation: Secondary | ICD-10-CM | POA: Insufficient documentation

## 2021-05-05 DIAGNOSIS — Z8589 Personal history of malignant neoplasm of other organs and systems: Secondary | ICD-10-CM | POA: Insufficient documentation

## 2021-05-05 DIAGNOSIS — C090 Malignant neoplasm of tonsillar fossa: Secondary | ICD-10-CM

## 2021-05-05 DIAGNOSIS — Z9221 Personal history of antineoplastic chemotherapy: Secondary | ICD-10-CM | POA: Diagnosis not present

## 2021-05-05 NOTE — Assessment & Plan Note (Signed)
Clinically, he has no signs of cancer recurrence He is considered a long-term cancer survivor and does not need long-term follow-up I reminded him the importance of neck exercises to prevent further fibrosis He will continue to follow-up with primary care doctor in the recommend close monitoring of thyroid function test

## 2021-05-05 NOTE — Progress Notes (Signed)
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   All questions were answered. The patient knows to call the clinic with any problems, questions or concerns. The total time spent in the appointment was 15 minutes encounter with patients including review of chart and various tests results, discussions about plan of care and coordination of care plan   Heath Lark, MD 05/05/2021 1:08 PM  INTERVAL HISTORY: Please see below for problem oriented charting. 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 Mild asymmetry of the left tonsil -- correlate with direct visualization to assess for primary neoplasm.    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    11/28/2015 PET scan   1. Left palatine tonsil primary with left worse than right cervical nodal metastasis. 2. No extracervical hypermetabolic metastasis.     12/12/2015 Procedure   Peg and port-a-cath placement.    12/18/2015 - 01/29/2016 Chemotherapy   He received weekly cetuximab    12/18/2015 - 02/03/2016 Radiation Therapy   Received Helicle IMRT Tomotherapy:  Left tonsil and bilateral neck / 70 Gy in 35 fractions to gross disease, 63 Gy in 35 fractions to high risk nodal echelons, and 56 Gy in 35 fractions to intermediate risk nodal echelons.    12/31/2015 Adverse Reaction   Treatment #3 is placed on hold due to worsening mucositis    01/08/2016 Miscellaneous   Cetuximab #3 resumed with 50% dose adjustment    01/14/2016 Adverse Reaction   Treatment #4 is placed on hold due to worsening mucositis and failure to thrive    03/20/2016 Imaging   CT abdomen showed mild gallbladder wall thickening with pericholecystic fluid and several gallstones. The gallbladder is nondistended. Recommend clinical correlation for acute or chronic cholecystitis.    04/15/2016 Procedure   PEG removed.    05/26/2016 Imaging   Restaging PET:  Marked interval improvement with resolution of left tonsillar hypermetabolism and right cervical lymph node hypermetabolism. Left cervical lymph nodes have decreased substantially in size and there is a subtle degree of FDG hypermetabolism still associated with these left-sided cervical nodes.    10/12/2016 Procedure   Port-a-cath removed.    02/14/2019 Imaging   CT neck 1. Satisfactory post treatment appearance of the neck. Guy Seese-Rads category 1. 2. No mass or  abnormality to correspond to the marked area of concern at the level of the right submandibular gland.       REVIEW OF SYSTEMS:   Constitutional: Denies fevers, chills or abnormal weight loss Eyes: Denies blurriness of  vision Ears, nose, mouth, throat, and face: Denies mucositis or sore throat Respiratory: Denies cough, dyspnea or wheezes Cardiovascular: Denies palpitation, chest discomfort or lower extremity swelling Gastrointestinal:  Denies nausea, heartburn or change in bowel habits Skin: Denies abnormal skin rashes Lymphatics: Denies new lymphadenopathy or easy bruising Neurological:Denies numbness, tingling or new weaknesses Behavioral/Psych: Mood is stable, no new changes  All other systems were reviewed with the patient and are negative.  I have reviewed the past medical history, past surgical history, social history and family history with the patient and they are unchanged from previous note.  ALLERGIES:  has No Known Allergies.  MEDICATIONS:  Current Outpatient Medications  Medication Sig Dispense Refill   aspirin EC 81 MG tablet Take 81 mg by mouth daily.     ibuprofen (ADVIL,MOTRIN) 200 MG tablet Take 200 mg by mouth as needed. Reported on 01/27/2016     ipratropium (ATROVENT) 0.06 % nasal spray Place 2 sprays into both nostrils every morning.     levothyroxine (SYNTHROID) 125 MCG tablet Take 125 mcg by mouth daily.   3   No current facility-administered medications for this visit.    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  No other abnormalities LYMPH:  no palpable lymphadenopathy in the cervical, axillary or inguinal LUNGS: clear to auscultation  and percussion with normal breathing effort HEART: regular rate & rhythm and no murmurs and no lower extremity edema ABDOMEN:abdomen soft, non-tender and normal bowel sounds Musculoskeletal:no cyanosis of digits and no clubbing  NEURO: alert & oriented x 3 with fluent speech, no focal motor/sensory deficits  LABORATORY DATA:  I have reviewed the data as listed    Component Value Date/Time   NA 140 04/07/2019 0859   NA 139 05/26/2016 1111   K 4.3 04/07/2019 0859   K 4.5 05/26/2016 1111   CL 108 04/07/2019 0859   CO2 26 04/07/2019 0859   CO2 28 05/26/2016 1111   GLUCOSE 108 (H) 04/07/2019 0859   GLUCOSE 97 05/26/2016 1111   BUN 18 04/07/2019 0859   BUN 18.8 10/01/2016 0956   CREATININE 1.03 04/07/2019 0859   CREATININE 1.0 10/01/2016 0956   CALCIUM 9.0 04/07/2019 0859   CALCIUM 9.9 05/26/2016 1111   PROT 6.6 04/07/2019 0859   PROT 7.2 05/26/2016 1111   ALBUMIN 3.5 04/07/2019 0859   ALBUMIN 3.7 05/26/2016 1111   AST 16 04/07/2019 0859   AST 16 05/26/2016 1111   ALT 16 04/07/2019 0859   ALT 15 05/26/2016 1111   ALKPHOS 70 04/07/2019 0859   ALKPHOS 81 05/26/2016 1111   BILITOT 0.4 04/07/2019 0859   BILITOT 0.74 05/26/2016 1111   GFRNONAA >60 04/07/2019 0859   GFRAA >60 04/07/2019 3235  No results found for: SPEP, UPEP  Lab Results  Component Value Date   WBC 3.4 (L) 04/07/2019   NEUTROABS 2.5 04/07/2019   HGB 13.3 04/07/2019   HCT 41.0 04/07/2019   MCV 97.2 04/07/2019   PLT 165 04/07/2019      Chemistry      Component Value Date/Time   NA 140 04/07/2019 0859   NA 139 05/26/2016 1111   K 4.3 04/07/2019 0859   K 4.5 05/26/2016 1111   CL 108 04/07/2019 0859   CO2 26 04/07/2019 0859   CO2 28 05/26/2016 1111   BUN 18 04/07/2019 0859   BUN 18.8 10/01/2016 0956   CREATININE 1.03 04/07/2019 0859   CREATININE 1.0 10/01/2016 0956      Component Value Date/Time   CALCIUM 9.0 04/07/2019 0859   CALCIUM 9.9 05/26/2016 1111   ALKPHOS 70 04/07/2019 0859   ALKPHOS  81 05/26/2016 1111   AST 16 04/07/2019 0859   AST 16 05/26/2016 1111   ALT 16 04/07/2019 0859   ALT 15 05/26/2016 1111   BILITOT 0.4 04/07/2019 0859   BILITOT 0.74 05/26/2016 1111

## 2021-11-03 ENCOUNTER — Other Ambulatory Visit: Payer: Self-pay | Admitting: Orthopedic Surgery

## 2021-11-05 ENCOUNTER — Other Ambulatory Visit: Payer: Self-pay

## 2021-11-05 ENCOUNTER — Encounter (HOSPITAL_BASED_OUTPATIENT_CLINIC_OR_DEPARTMENT_OTHER): Payer: Self-pay | Admitting: Orthopedic Surgery

## 2021-11-10 NOTE — Progress Notes (Signed)

## 2021-11-14 ENCOUNTER — Ambulatory Visit (HOSPITAL_BASED_OUTPATIENT_CLINIC_OR_DEPARTMENT_OTHER)
Admission: RE | Admit: 2021-11-14 | Discharge: 2021-11-14 | Disposition: A | Discharge status: Home / Self Care | Payer: 59 | Attending: Orthopedic Surgery | Admitting: Orthopedic Surgery

## 2021-11-14 ENCOUNTER — Encounter (HOSPITAL_BASED_OUTPATIENT_CLINIC_OR_DEPARTMENT_OTHER)
Admission: RE | Disposition: A | Discharge status: Home / Self Care | Payer: Self-pay | Source: Home / Self Care | Attending: Orthopedic Surgery

## 2021-11-14 ENCOUNTER — Ambulatory Visit (HOSPITAL_BASED_OUTPATIENT_CLINIC_OR_DEPARTMENT_OTHER): Payer: 59 | Admitting: Anesthesiology

## 2021-11-14 ENCOUNTER — Encounter (HOSPITAL_BASED_OUTPATIENT_CLINIC_OR_DEPARTMENT_OTHER): Payer: Self-pay | Admitting: Orthopedic Surgery

## 2021-11-14 DIAGNOSIS — G5602 Carpal tunnel syndrome, left upper limb: Secondary | ICD-10-CM | POA: Diagnosis present

## 2021-11-14 HISTORY — DX: Malignant neoplasm of tonsillar fossa: C09.0

## 2021-11-14 HISTORY — PX: CARPAL TUNNEL RELEASE: SHX101

## 2021-11-14 HISTORY — DX: Hypothyroidism, unspecified: E03.9

## 2021-11-14 SURGERY — CARPAL TUNNEL RELEASE
Anesthesia: Monitor Anesthesia Care | Site: Wrist | Laterality: Left

## 2021-11-14 MED ORDER — ONDANSETRON HCL 4 MG/2ML IJ SOLN
INTRAMUSCULAR | Status: DC | PRN
Start: 2021-11-14 — End: 2021-11-14
  Administered 2021-11-14: 4 mg via INTRAVENOUS

## 2021-11-14 MED ORDER — HYDROCODONE-ACETAMINOPHEN 5-325 MG PO TABS
ORAL_TABLET | ORAL | 0 refills | Status: DC
Start: 2021-11-14 — End: 2023-03-25

## 2021-11-14 MED ORDER — BUPIVACAINE HCL (PF) 0.25 % IJ SOLN
INTRAMUSCULAR | Status: DC | PRN
  Administered 2021-11-14: 9 mL

## 2021-11-14 MED ORDER — PROPOFOL 10 MG/ML IV BOLUS
INTRAVENOUS | Status: AC
  Filled 2021-11-14: qty 20

## 2021-11-14 MED ORDER — MIDAZOLAM HCL 2 MG/2ML IJ SOLN
INTRAMUSCULAR | Status: AC
  Filled 2021-11-14: qty 2

## 2021-11-14 MED ORDER — LIDOCAINE HCL (PF) 0.5 % IJ SOLN
INTRAMUSCULAR | Status: DC | PRN
  Administered 2021-11-14: 30 mL via INTRAVENOUS

## 2021-11-14 MED ORDER — AMISULPRIDE (ANTIEMETIC) 5 MG/2ML IV SOLN: 10.0000 mg | Freq: Once | INTRAVENOUS | Status: DC | PRN

## 2021-11-14 MED ORDER — MIDAZOLAM HCL 2 MG/2ML IJ SOLN
INTRAMUSCULAR | Status: DC | PRN
  Administered 2021-11-14: 2 mg via INTRAVENOUS

## 2021-11-14 MED ORDER — CEFAZOLIN SODIUM-DEXTROSE 2-4 GM/100ML-% IV SOLN
2.0000 g | INTRAVENOUS | Status: AC
  Administered 2021-11-14: 2 g via INTRAVENOUS

## 2021-11-14 MED ORDER — CEFAZOLIN SODIUM-DEXTROSE 2-4 GM/100ML-% IV SOLN
INTRAVENOUS | Status: AC
  Filled 2021-11-14: qty 100

## 2021-11-14 MED ORDER — 0.9 % SODIUM CHLORIDE (POUR BTL) OPTIME
TOPICAL | Status: DC | PRN
  Administered 2021-11-14: 60 mL

## 2021-11-14 MED ORDER — LACTATED RINGERS IV SOLN: INTRAVENOUS | Status: DC

## 2021-11-14 MED ORDER — PROPOFOL 10 MG/ML IV BOLUS
INTRAVENOUS | Status: DC | PRN
  Administered 2021-11-14 (×2): 20 mg via INTRAVENOUS
  Administered 2021-11-14: 30 mg via INTRAVENOUS
  Administered 2021-11-14 (×2): 20 mg via INTRAVENOUS

## 2021-11-14 MED ORDER — FENTANYL CITRATE (PF) 100 MCG/2ML IJ SOLN: 25.0000 ug | INTRAMUSCULAR | Status: DC | PRN

## 2021-11-14 MED ORDER — FENTANYL CITRATE (PF) 100 MCG/2ML IJ SOLN
INTRAMUSCULAR | Status: AC
  Filled 2021-11-14: qty 2

## 2021-11-14 MED ORDER — FENTANYL CITRATE (PF) 100 MCG/2ML IJ SOLN
INTRAMUSCULAR | Status: DC | PRN
  Administered 2021-11-14 (×2): 25 ug via INTRAVENOUS
  Administered 2021-11-14: 50 ug via INTRAVENOUS

## 2021-11-14 MED ORDER — PROMETHAZINE HCL 25 MG/ML IJ SOLN: 6.2500 mg | INTRAMUSCULAR | Status: DC | PRN

## 2021-11-14 SURGICAL SUPPLY — 36 items
APL PRP STRL LF DISP 70% ISPRP (MISCELLANEOUS) ×1
BLADE SURG 15 STRL LF DISP TIS (BLADE) ×2 IMPLANT
BLADE SURG 15 STRL SS (BLADE) ×4
BNDG CMPR 9X4 STRL LF SNTH (GAUZE/BANDAGES/DRESSINGS)
BNDG ELASTIC 3X5.8 VLCR STR LF (GAUZE/BANDAGES/DRESSINGS) ×2 IMPLANT
BNDG ESMARK 4X9 LF (GAUZE/BANDAGES/DRESSINGS) IMPLANT
BNDG GAUZE ELAST 4 BULKY (GAUZE/BANDAGES/DRESSINGS) ×2 IMPLANT
CHLORAPREP W/TINT 26 (MISCELLANEOUS) ×2 IMPLANT
CORD BIPOLAR FORCEPS 12FT (ELECTRODE) ×2 IMPLANT
COVER BACK TABLE 60X90IN (DRAPES) ×2 IMPLANT
COVER MAYO STAND STRL (DRAPES) ×2 IMPLANT
CUFF TOURN SGL QUICK 18 NS (TOURNIQUET CUFF) ×1 IMPLANT
CUFF TOURN SGL QUICK 18X4 (TOURNIQUET CUFF) ×2 IMPLANT
DRAPE EXTREMITY T 121X128X90 (DISPOSABLE) ×2 IMPLANT
DRAPE SURG 17X23 STRL (DRAPES) ×2 IMPLANT
DRSG PAD ABDOMINAL 8X10 ST (GAUZE/BANDAGES/DRESSINGS) ×2 IMPLANT
GAUZE SPONGE 4X4 12PLY STRL (GAUZE/BANDAGES/DRESSINGS) ×2 IMPLANT
GAUZE XEROFORM 1X8 LF (GAUZE/BANDAGES/DRESSINGS) ×2 IMPLANT
GLOVE SRG 8 PF TXTR STRL LF DI (GLOVE) ×1 IMPLANT
GLOVE SURG ENC MOIS LTX SZ7.5 (GLOVE) ×2 IMPLANT
GLOVE SURG UNDER POLY LF SZ8 (GLOVE) ×2
GOWN STRL REUS W/ TWL LRG LVL3 (GOWN DISPOSABLE) ×1 IMPLANT
GOWN STRL REUS W/TWL LRG LVL3 (GOWN DISPOSABLE) ×2
GOWN STRL REUS W/TWL XL LVL3 (GOWN DISPOSABLE) ×2 IMPLANT
NDL HYPO 25X1 1.5 SAFETY (NEEDLE) ×1 IMPLANT
NEEDLE HYPO 25X1 1.5 SAFETY (NEEDLE) ×2 IMPLANT
NS IRRIG 1000ML POUR BTL (IV SOLUTION) ×2 IMPLANT
PACK BASIN DAY SURGERY FS (CUSTOM PROCEDURE TRAY) ×2 IMPLANT
PADDING CAST ABS 4INX4YD NS (CAST SUPPLIES) ×1
PADDING CAST ABS COTTON 4X4 ST (CAST SUPPLIES) ×1 IMPLANT
STOCKINETTE 4X48 STRL (DRAPES) ×2 IMPLANT
SUT ETHILON 4 0 PS 2 18 (SUTURE) ×2 IMPLANT
SYR BULB EAR ULCER 3OZ GRN STR (SYRINGE) ×2 IMPLANT
SYR CONTROL 10ML LL (SYRINGE) ×2 IMPLANT
TOWEL GREEN STERILE FF (TOWEL DISPOSABLE) ×4 IMPLANT
UNDERPAD 30X36 HEAVY ABSORB (UNDERPADS AND DIAPERS) ×2 IMPLANT

## 2021-11-14 NOTE — Anesthesia Preprocedure Evaluation (Addendum)
Anesthesia Evaluation  Patient identified by MRN, date of birth, ID band Patient awake    Reviewed: Allergy & Precautions, NPO status , Patient's Chart, lab work & pertinent test results  History of Anesthesia Complications Negative for: history of anesthetic complications  Airway Mallampati: II  TM Distance: >3 FB Neck ROM: Full    Dental  (+) Caps, Dental Advisory Given   Pulmonary PE Cancer of tonsillar fossa    Pulmonary exam normal        Cardiovascular negative cardio ROS Normal cardiovascular exam     Neuro/Psych negative neurological ROS     GI/Hepatic negative GI ROS, Neg liver ROS,   Endo/Other  Hypothyroidism   Renal/GU negative Renal ROS     Musculoskeletal negative musculoskeletal ROS (+)   Abdominal   Peds  Hematology  (+) Blood dyscrasia, ,   Anesthesia Other Findings   Reproductive/Obstetrics                            Anesthesia Physical Anesthesia Plan  ASA: 3  Anesthesia Plan: MAC and Bier Block and Bier Block-LIDOCAINE ONLY   Post-op Pain Management: Minimal or no pain anticipated and Tylenol PO (pre-op)   Induction:   PONV Risk Score and Plan: 1 and Ondansetron  Airway Management Planned: Natural Airway  Additional Equipment:   Intra-op Plan:   Post-operative Plan:   Informed Consent: I have reviewed the patients History and Physical, chart, labs and discussed the procedure including the risks, benefits and alternatives for the proposed anesthesia with the patient or authorized representative who has indicated his/her understanding and acceptance.     Dental advisory given  Plan Discussed with: Anesthesiologist and CRNA  Anesthesia Plan Comments:        Anesthesia Quick Evaluation

## 2021-11-14 NOTE — Anesthesia Procedure Notes (Signed)
Anesthesia Regional Block: Bier block ([facility] Regional)   Pre-Anesthetic Checklist: , timeout performed,  Correct Patient, Correct Site, Correct Laterality,  Correct Procedure, Correct Position, site marked,  Risks and benefits discussed,  Surgical consent,  Pre-op evaluation,  At surgeon's request  Laterality: Left          Procedures:,,,,, intact distal pulses, Esmarch exsanguination,  Single tourniquet utilized    Narrative:  Start time: 11/14/2021 1:11 PM End time: 11/14/2021 1:12 PM  Performed by: Personally

## 2021-11-14 NOTE — Anesthesia Postprocedure Evaluation (Signed)
Anesthesia Post Note  Patient: Gregory Jenkins  Procedure(s) Performed: LEFT CARPAL TUNNEL RELEASE (Left: Wrist)     Patient location during evaluation: PACU Anesthesia Type: MAC and Bier Block Level of consciousness: awake and alert Pain management: pain level controlled Vital Signs Assessment: post-procedure vital signs reviewed and stable Respiratory status: spontaneous breathing and respiratory function stable Cardiovascular status: stable Postop Assessment: no apparent nausea or vomiting Anesthetic complications: no   No notable events documented.  Last Vitals:  Vitals:   11/14/21 1345 11/14/21 1400  BP: 132/81 135/67  Pulse: 62 (!) 59  Resp: 18 12  Temp: 36.4 C   SpO2: 100% 99%    Last Pain:  Vitals:   11/14/21 1345  TempSrc:   PainSc: 0-No pain                 Auburn Hester DANIEL

## 2021-11-14 NOTE — H&P (Signed)
Gregory Jenkins is an 71 y.o. male.   Chief Complaint: carpal tunnel syndrome HPI: 71 yo male with numbness and tingling left hand.  Nocturnal symptoms.  Positive nerve conduction studies.  He wishes to have left carpal tunnel release.  Allergies: No Known Allergies  Past Medical History:  Diagnosis Date   Abdominal pain 03/19/2016   Allergic rhinitis    Cancer of tonsillar fossa ([facility])    left   Clotting disorder ([facility])    PE, suspect lupus anticoagulant   Drug-induced skin rash 12/25/2015   History of nephrolithiasis    History of pulmonary embolism    History of radiation therapy 12/17/2015- 02/03/16   Left Tonsil and Bilateral Neck   Hypothyroidism    Mucositis oral 01/08/2016   Thyroid disease    Tonsil cancer [facility])     Past Surgical History:  Procedure Laterality Date   AMPUTATION Left 01/01/2018   Procedure: REVISION AMPUTATION RING FINGER;  Surgeon: Iran Planas, MD;  Location: [facility];  Service: Orthopedics;  Laterality: Left;   EYE SURGERY Right 1980's   H/O right orbital "blowout" fracture   FOOT SURGERY Left    [facility]   GASTROSTOMY TUBE PLACEMENT     IR GASTROSTOMY TUBE REMOVAL     IR GENERIC HISTORICAL  10/12/2016   IR REMOVAL TUN ACCESS W/ PORT W/O FL MOD SED 10/12/2016 Corrie Mckusick, DO [facility]-INTERV RAD   KNEE SURGERY Right    Arthroscopic   PORTA CATH INSERTION      Family History: Family History  Problem Relation Age of Onset   Cancer Father        esophageal ca   Cancer Maternal Grandmother        brain ca   Cancer Maternal Grandfather        lung ca   COPD Mother    Cancer Maternal Aunt        breast ca    Social History:   reports that he has never smoked. He quit smokeless tobacco use about 14 years ago.  His smokeless tobacco use included chew. He reports current alcohol use. He reports that he does not use drugs.  Medications: Medications Prior to Admission  Medication Sig Dispense Refill   aspirin EC 81 MG tablet Take 81 mg by mouth  daily.     ibuprofen (ADVIL,MOTRIN) 200 MG tablet Take 200 mg by mouth as needed. Reported on 01/27/2016     ipratropium (ATROVENT) 0.06 % nasal spray Place 2 sprays into both nostrils every morning.     levothyroxine (SYNTHROID) 125 MCG tablet Take 125 mcg by mouth daily.   3    No results found for this or any previous visit (from the past 48 hour(s)).  No results found.    Blood pressure 139/76, pulse 61, temperature 98.2 F (36.8 C), temperature source Oral, resp. rate 20, height 6' (1.829 m), weight 81.6 kg, SpO2 98 %.  General appearance: alert, cooperative, and appears stated age Head: Normocephalic, without obvious abnormality, atraumatic Neck: supple, symmetrical, trachea midline Cardio: regular rate and rhythm Resp: clear to auscultation bilaterally Extremities: Intact sensation and capillary refill all digits.  +epl/fpl/io.  No wounds.  Pulses: 2+ and symmetric Skin: Skin color, texture, turgor normal. No rashes or lesions Neurologic: Grossly normal Incision/Wound: none  Assessment/Plan Left carpal tunnel syndrome.  Non operative and operative treatment options have been discussed with the patient and patient wishes to proceed with operative treatment. Risks, benefits, and alternatives of surgery have been  discussed and the patient agrees with the plan of care.   Leanora Cover 11/14/2021, 11:56 AM

## 2021-11-14 NOTE — Discharge Instructions (Addendum)

## 2021-11-14 NOTE — Op Note (Signed)
11/14/2021 [facility]                              OPERATIVE REPORT   PREOPERATIVE DIAGNOSIS:  Left carpal tunnel syndrome.  POSTOPERATIVE DIAGNOSIS:  Left carpal tunnel syndrome.  PROCEDURE:  Left carpal tunnel release.  SURGEON:  Leanora Cover, MD  ASSISTANT:  none.  ANESTHESIA: Bier block with sedation  IV FLUIDS:  Per anesthesia flow sheet.  ESTIMATED BLOOD LOSS:  Minimal.  COMPLICATIONS:  None.  SPECIMENS:  None.  TOURNIQUET TIME:    Total Tourniquet Time Documented: Forearm (Left) - 28 minutes Total: Forearm (Left) - 28 minutes   DISPOSITION:  Stable to PACU.  LOCATION: [facility]  INDICATIONS:  71 yo male with numbness and tingling left hand.  Positive nerve conduction studies.  He wishes to have a carpal tunnel release for management of his symptoms.  Risks, benefits and alternatives of surgery were discussed including the risk of blood loss; infection; damage to nerves, vessels, tendons, ligaments, bone; failure of surgery; need for additional surgery; complications with wound healing; continued pain; recurrence of carpal tunnel syndrome; and damage to motor branch. He voiced understanding of these risks and elected to proceed.   OPERATIVE COURSE:  After being identified preoperatively by myself, the patient and I agreed upon the procedure and site of procedure.  The surgical site was marked.  Surgical consent had been signed.  He was given IV Ancef as preoperative antibiotic prophylaxis.  He was transferred to the operating room and placed on the operating room table in supine position with the Left upper extremity on an armboard.  Bier block anesthesia was induced by the anesthesiologist.  Left upper extremity was prepped and draped in normal sterile orthopaedic fashion.  A surgical pause was performed between the surgeons, anesthesia, and operating room staff, and all were in agreement as to the patient, procedure, and site of procedure.   Tourniquet at the proximal aspect of the forearm had been inflated for the Bier block  Incision was made over the transverse carpal ligament and carried into the subcutaneous tissues by spreading technique.  Bipolar electrocautery was used to obtain hemostasis.  The palmar fascia was sharply incised.  The transverse carpal ligament was identified and sharply incised.  It was incised distally first.  The flexor tendons were identified.  The flexor tendon to the ring finger was identified and retracted radially.  The transverse carpal ligament was then incised proximally.  Scissors were used to split the distal aspect of the volar antebrachial fascia.  A finger was placed into the wound to ensure complete decompression, which was the case.  The nerve was examined.  It was flattened and hyperemic and there was an hourglass deformity.  The motor branch was identified and was intact.  The wound was copiously irrigated with sterile saline.  It was then closed with 4-0 nylon in a horizontal mattress fashion.  It was injected with 0.25% plain Marcaine to aid in postoperative analgesia.  It was dressed with sterile Xeroform, 4x4s, an ABD, and wrapped with Kerlix and an Ace bandage.  Tourniquet was deflated at 28 minutes.  Fingertips were pink with brisk capillary refill after deflation of the tourniquet.  Operative drapes were broken down.  The patient was awoken from anesthesia safely.  He was transferred back to stretcher and taken to the PACU in stable condition.  I will see him back in the  office in 1 week for postoperative followup.  I will give him a prescription for Norco 5/325 1-2 tabs PO q6 hours prn pain, dispense # 15.    Leanora Cover, MD Electronically signed, 11/14/21

## 2021-11-14 NOTE — Transfer of Care (Signed)
Immediate Anesthesia Transfer of Care Note  Patient: Gregory Jenkins  Procedure(s) Performed: LEFT CARPAL TUNNEL RELEASE (Left: Wrist)  Patient Location: PACU  Anesthesia Type:Bier block  Level of Consciousness: awake, alert  and oriented  Airway & Oxygen Therapy: Patient Spontanous Breathing and Patient connected to face mask oxygen  Post-op Assessment: Report given to RN and Post -op Vital signs reviewed and stable  Post vital signs: Reviewed and stable  Last Vitals:   BP: 132/81 (96) RR: 16 Vitals Value Taken Time  BP  11/14/21 1346  Temp    Pulse 66 11/14/21 1348  Resp    SpO2 100 % 11/14/21 1348  Vitals shown include unvalidated device data.  Last Pain:  Vitals:   11/14/21 1144  TempSrc: Oral  PainSc: 1          Complications: No notable events documented.

## 2021-11-17 ENCOUNTER — Encounter (HOSPITAL_BASED_OUTPATIENT_CLINIC_OR_DEPARTMENT_OTHER): Payer: Self-pay | Admitting: Orthopedic Surgery

## 2021-11-17 NOTE — Progress Notes (Signed)
Left message stating courtesy call and if any questions or concerns please call the doctors office.  

## 2023-03-15 ENCOUNTER — Other Ambulatory Visit (HOSPITAL_BASED_OUTPATIENT_CLINIC_OR_DEPARTMENT_OTHER): Payer: Self-pay | Admitting: Family Medicine

## 2023-03-15 ENCOUNTER — Ambulatory Visit (HOSPITAL_BASED_OUTPATIENT_CLINIC_OR_DEPARTMENT_OTHER)
Admission: RE | Admit: 2023-03-15 | Discharge: 2023-03-15 | Disposition: A | Discharge status: Home / Self Care | Payer: 59 | Source: Ambulatory Visit | Attending: Family Medicine | Admitting: Family Medicine

## 2023-03-15 DIAGNOSIS — M79602 Pain in left arm: Secondary | ICD-10-CM | POA: Insufficient documentation

## 2023-03-25 ENCOUNTER — Encounter (HOSPITAL_COMMUNITY): Payer: Self-pay | Admitting: Vascular Surgery

## 2023-03-25 ENCOUNTER — Ambulatory Visit (HOSPITAL_COMMUNITY)
Admission: RE | Admit: 2023-03-25 | Discharge: 2023-03-25 | Disposition: A | Discharge status: Home / Self Care | Payer: 59 | Source: Ambulatory Visit | Attending: Vascular Surgery | Admitting: Vascular Surgery

## 2023-03-25 ENCOUNTER — Other Ambulatory Visit (HOSPITAL_COMMUNITY): Payer: Self-pay

## 2023-03-25 VITALS — BP 107/66 | HR 57

## 2023-03-25 DIAGNOSIS — I82622 Acute embolism and thrombosis of deep veins of left upper extremity: Secondary | ICD-10-CM

## 2023-03-25 MED ORDER — APIXABAN 5 MG PO TABS: 5.0000 mg | ORAL_TABLET | Freq: Two times a day (BID) | ORAL | 5 refills | Status: DC

## 2023-03-25 NOTE — Patient Instructions (Signed)
-  Continue apixaban (Eliquis) 5 mg twice daily. -Your refills have been sent to your [facility]. You may need to call the pharmacy to ask them to fill this when you start to run low on your current supply.  -It is important to take your medication around the same time every day.  -Avoid NSAIDs like ibuprofen (Advil, Motrin) and naproxen (Aleve) as well as aspirin doses over 100 mg daily. -Tylenol (acetaminophen) is the preferred over the counter pain medication to lower the risk of bleeding. -Be sure to alert all of your health care providers that you are taking an anticoagulant prior to starting a new medication or having a procedure. -Monitor for signs and symptoms of bleeding (abnormal bruising, prolonged bleeding, nose bleeds, bleeding from gums, discolored urine, black tarry stools). If you have fallen and hit your head OR if your bleeding is severe or not stopping, seek emergency care.  -Go to the emergency room if emergent signs and symptoms of new clot occur (new or worse swelling and pain in an arm or leg, shortness of breath, chest pain, fast or irregular heartbeats, lightheadedness, dizziness, fainting, coughing up blood) or if you experience a significant color change (pale or blue) in the extremity that has the DVT.   Follow up with Dr. Lenell Antu. [phone]  If you have any questions or need to reschedule an appointment, please call [phone] [facility].  If you are having an emergency, call 911 or present to the nearest emergency room.   What is a DVT?  -Deep vein thrombosis (DVT) is a condition in which a blood clot forms in a vein of the deep venous system which can occur in the lower leg, thigh, pelvis, arm, or neck. This condition is serious and can be life-threatening if the clot travels to the arteries of the lungs and causing a blockage (pulmonary embolism, PE). A DVT can also damage veins in the leg, which can lead to long-term venous disease, leg pain, swelling, discoloration, and  ulcers or sores (post-thrombotic syndrome).  -Treatment may include taking an anticoagulant medication to prevent more clots from forming and the current clot from growing, wearing compression stockings, and/or surgical procedures to remove or dissolve the clot.

## 2023-03-25 NOTE — Progress Notes (Addendum)
DVT Clinic Note  Name: Gregory Jenkins     MRN: [id number]     DOB: Apr 23, 1951     Sex: male  PCP: [facility] [place]  Today's Visit: Visit Information: Initial Visit  Referred to DVT Clinic by: Meridee Score, NP [facility])  Referred to CPP by: Dr. Lenell Antu Reason for referral:  Chief Complaint  Patient presents with   DVT   HISTORY OF PRESENT ILLNESS: Gregory Jenkins is a 72 y.o. 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 Patient reports that last Monday 03/15/23 he woke up with his left arm swollen. Ultrasound at that time showed acute DVT involving the left subclavian, brachial, and axillary veins. He was started on Eliquis by his PCP and referred to the DVT Clinic for further work up and management.   Today, patient reports that his arm has remained very swollen, but it isn't bothering him. No pain or tingling. 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 In the 1-2 weeks prior to symptom onset, he was digging out an old tree stump that did involve swinging an axe over his head repeatedly. He is tolerating Eliquis well with no abnormal bleeding or bruising and no missed doses.   Positive Thrombotic Risk Factors: Previous VTE, Older Age Bleeding Risk Factors: Age >65 years  Negative Thrombotic Risk Factors: Recent surgery (within 3 months), Recent trauma (within 3 months), Recent admission to hospital with acute illness (within 3 months), Paralysis, paresis, or recent plaster cast immobilization of lower extremity, Bed rest >72 hours within 3 months, Sedentary journey lasting >8 hours within 4 weeks, Central venous catheterization, Pregnancy, Testosterone therapy, Estrogen therapy, Active cancer, Smoking, Obesity,  Known thrombophilic condition, Recent COVID diagnosis (within 3 months), Recent cesarean section (within 3 months), Within 6 weeks postpartum, Erythropoiesis-stimulating agent, Non-malignant, chronic inflammatory condition  Rx Insurance Coverage: Commercial Rx Affordability: Eliquis is $0/month on his insurance Preferred Pharmacy: [facility] [place].  Past Medical History:  Diagnosis Date   Abdominal pain 03/19/2016   Allergic rhinitis    Cancer of tonsillar fossa (HCC)    left   Clotting disorder (HCC)    PE, suspect lupus anticoagulant   Drug-induced skin rash 12/25/2015   History of nephrolithiasis    History of pulmonary embolism    History of radiation therapy 12/17/2015- 02/03/16   Left Tonsil and Bilateral Neck   Hypothyroidism    Mucositis oral 01/08/2016   Thyroid disease    Tonsil [facility])     Past Surgical History:  Procedure Laterality Date   AMPUTATION Left 01/01/2018   Procedure: REVISION AMPUTATION RING FINGER;  Surgeon: Bradly Bienenstock, MD;  Location: [facility];  Service: Orthopedics;  Laterality: Left;   CARPAL TUNNEL RELEASE Left 11/14/2021   Procedure: LEFT CARPAL TUNNEL RELEASE;  Surgeon: Betha Loa, MD;  Location: [facility];  Service: Orthopedics;  Laterality: Left;  30 MIN   EYE SURGERY Right 1980's   H/O right orbital "blowout" fracture   FOOT SURGERY Left    [facility] Neuroma   GASTROSTOMY TUBE PLACEMENT     IR GASTROSTOMY TUBE REMOVAL     IR GENERIC HISTORICAL  10/12/2016   IR REMOVAL TUN ACCESS W/ PORT W/O FL MOD SED  10/12/2016 Gilmer Mor, DO [facility]INTER[facility] RAD   KNEE SURGERY Right    Arthroscopic   PORTA CATH INSERTION      Social History   Socioeconomic History   Marital status: Married    Spouse name: Not on file   Number of children: 3   Years of education: Not on file   Highest education level: Not on file  Occupational History   Not on file  Tobacco Use   Smoking status: Never   Smokeless  tobacco: Former    Types: Chew    Quit date: 11/20/2006  Vaping Use   Vaping Use: Never used  Substance and Sexual Activity   Alcohol use: Yes    Alcohol/week: 0.0 standard drinks of alcohol    Comment: He reports he is a social drinker   Drug use: No   Sexual activity: Not on file  Other Topics Concern   Not on file  Social History Narrative   Not on file   Social Determinants of Health   Financial Resource Strain: Not on file  Food Insecurity: Not on file  Transportation Needs: Not on file  Physical Activity: Not on file  Stress: Not on file  Social Connections: Not on file  Intimate Partner Violence: Not on file    Family History  Problem Relation Age of Onset   Cancer Father        esophageal ca   Cancer Maternal Grandmother        brain ca   Cancer Maternal Grandfather        lung ca   COPD Mother    Cancer Maternal Aunt        breast ca    Allergies as of 03/25/2023   (No Known Allergies)    Current Outpatient Medications on File Prior to Encounter  Medication Sig Dispense Refill   aspirin EC 81 MG tablet Take 81 mg by mouth daily.     atorvastatin (LIPITOR) 10 MG tablet Take 10 mg by mouth daily.     Cyanocobalamin 1000 MCG/15ML LIQD Inject 1,000 mcg into the muscle every 30 (thirty) days.     ipratropium (ATROVENT) 0.06 % nasal spray Place 2 sprays into both nostrils every morning.     levothyroxine (SYNTHROID) 125 MCG tablet Take 125 mcg by mouth daily.   3   No current facility-administered medications on file prior to encounter.   REVIEW OF SYSTEMS:  Review of Systems  Respiratory:  Negative for shortness of breath.   Cardiovascular:  Negative for chest pain and palpitations.  Musculoskeletal:  Negative for myalgias.  Neurological:  Negative for dizziness and tingling.   PHYSICAL EXAMINATION:  Vitals:   03/25/23 1328  BP: 107/66  Pulse: (!) 57  SpO2: 98%   Physical Exam Vitals reviewed.  Cardiovascular:     Rate and Rhythm: Normal rate.   Pulmonary:     Effort: Pulmonary effort is normal.  Musculoskeletal:        General: Swelling (left upper extremity) present. No tenderness.   LABS:  CBC     Component Value Date/Time   WBC 3.4 (L) 04/07/2019 0859   RBC 4.22 04/07/2019 0859   HGB 13.3 04/07/2019 0859   HGB 13.9 05/26/2016 1111   HCT 41.0 04/07/2019 0859   HCT 40.6 05/26/2016 1111   PLT 165 04/07/2019 0859   PLT 149 05/26/2016 1111   MCV 97.2 04/07/2019 0859   MCV 96.4 05/26/2016 1111   MCH 31.5 04/07/2019 0859   MCHC 32.4  04/07/2019 0859   RDW 12.8 04/07/2019 0859   RDW 13.4 05/26/2016 1111   LYMPHSABS 0.5 (L) 04/07/2019 0859   LYMPHSABS 0.4 (L) 05/26/2016 1111   MONOABS 0.3 04/07/2019 0859   MONOABS 0.5 05/26/2016 1111   EOSABS 0.1 04/07/2019 0859   EOSABS 0.0 05/26/2016 1111   BASOSABS 0.0 04/07/2019 0859   BASOSABS 0.0 05/26/2016 1111    Hepatic Function      Component Value Date/Time   PROT 6.6 04/07/2019 0859   PROT 7.2 05/26/2016 1111   ALBUMIN 3.5 04/07/2019 0859   ALBUMIN 3.7 05/26/2016 1111   AST 16 04/07/2019 0859   AST 16 05/26/2016 1111   ALT 16 04/07/2019 0859   ALT 15 05/26/2016 1111   ALKPHOS 70 04/07/2019 0859   ALKPHOS 81 05/26/2016 1111   BILITOT 0.4 04/07/2019 0859   BILITOT 0.74 05/26/2016 1111    Renal Function   Lab Results  Component Value Date   CREATININE 1.03 04/07/2019   CREATININE 1.15 04/01/2018   CREATININE 0.94 10/12/2016    CrCl cannot be calculated (Patient's most recent lab result is older than the maximum 21 days allowed.).   VVS Vascular Lab Studies:  03/25/23  IMPRESSION: Deep venous thrombosis of the left subclavian, axillary and brachial veins is noted.   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 Dr. Lenell Antu came to evaluate the patient. Please see his additional note. We will continue Eliquis, and Dr. Lenell Antu will arrange for the patient to have a CT to  further investigate if there is any compression of the subclavian vein.   PLAN: -Continue apixaban (Eliquis) 5 mg twice daily. -Expected duration of therapy: at least 6 months; further recommendations per Dr. Lenell Antu after further work up. Therapy started on 03/15/23. -Patient educated on purpose, proper use and potential adverse effects of apixaban (Eliquis). -Discussed importance of taking medication around the same time every day. -Advised patient of medications to avoid (NSAIDs, aspirin doses >100 mg daily). -Educated that Tylenol (acetaminophen) is the preferred analgesic to lower the risk of bleeding. -Advised patient to alert all providers of anticoagulation therapy prior to starting a new medication or having a procedure. -Emphasized importance of monitoring for signs and symptoms of bleeding (abnormal bruising, prolonged bleeding, nose bleeds, bleeding from gums, discolored urine, black tarry stools). -Educated patient to present to the ED if emergent signs and symptoms of new thrombosis occur. -Encouraged elevation to assist with improving swelling.  -Provided refills of Eliquis to the patient's preferred pharmacy.   Follow up: Dr. Lenell Antu will arrange for CT and follow up in his office   Pervis Hocking, PharmD, BCACP, CPP Deep Vein Thrombosis Clinic Clinical Pharmacist Practitioner Office: [phone]

## 2023-03-26 ENCOUNTER — Telehealth: Payer: Self-pay | Admitting: Vascular Surgery

## 2023-03-26 NOTE — Telephone Encounter (Signed)
Appt has been scheduled.

## 2023-03-26 NOTE — Telephone Encounter (Signed)
-----   Message from Leonie Douglas, MD sent at 03/25/2023  5:03 PM EDT ----- Please arrange for CT Chest with venous contrast (not angiogram) and follow up with me in 2 weeks. Thanks. Elijah Birk

## 2023-03-29 ENCOUNTER — Other Ambulatory Visit: Payer: Self-pay

## 2023-03-29 DIAGNOSIS — I82622 Acute embolism and thrombosis of deep veins of left upper extremity: Secondary | ICD-10-CM

## 2023-04-12 ENCOUNTER — Ambulatory Visit (HOSPITAL_COMMUNITY)
Admission: RE | Admit: 2023-04-12 | Discharge: 2023-04-12 | Disposition: A | Discharge status: Home / Self Care | Payer: 59 | Source: Ambulatory Visit | Attending: Vascular Surgery | Admitting: Vascular Surgery

## 2023-04-12 DIAGNOSIS — I82622 Acute embolism and thrombosis of deep veins of left upper extremity: Secondary | ICD-10-CM | POA: Insufficient documentation

## 2023-04-12 MED ORDER — IOHEXOL 350 MG/ML SOLN
75.0000 mL | Freq: Once | INTRAVENOUS | Status: AC | PRN
  Administered 2023-04-12: 75 mL via INTRAVENOUS

## 2023-04-13 ENCOUNTER — Encounter: Payer: Self-pay | Admitting: Vascular Surgery

## 2023-04-13 ENCOUNTER — Ambulatory Visit (INDEPENDENT_AMBULATORY_CARE_PROVIDER_SITE_OTHER): Payer: 59 | Admitting: Vascular Surgery

## 2023-04-13 ENCOUNTER — Other Ambulatory Visit: Payer: Self-pay

## 2023-04-13 VITALS — BP 136/69 | HR 60 | Temp 97.9°F | Wt 183.0 lb

## 2023-04-13 DIAGNOSIS — R222 Localized swelling, mass and lump, trunk: Secondary | ICD-10-CM

## 2023-04-13 DIAGNOSIS — G458 Other transient cerebral ischemic attacks and related syndromes: Secondary | ICD-10-CM

## 2023-04-13 DIAGNOSIS — I82622 Acute embolism and thrombosis of deep veins of left upper extremity: Secondary | ICD-10-CM | POA: Diagnosis not present

## 2023-04-13 NOTE — Progress Notes (Signed)
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 CT scan worrisome for non-specific mass in left thoracic outlet.  I reviewed the differential diagnosis in this area.  I am most concerned given his history of head/neck cancer that this could Jenkins recurrence in a periclavicular node.  Will check an MRI of left shoulder and neck to better evaluate.   CHIEF COMPLAINT: Left arm swelling  HISTORY OF PRESENT ILLNESS: Gregory Jenkins is a 72 y.o. 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 Patient reports that last Monday 03/15/23 he woke up with his left arm swollen. Ultrasound at that time showed acute DVT involving the left subclavian, brachial, and axillary veins. He was started on Eliquis by his PCP and referred to the DVT Clinic for further work up and management.    Today, patient reports that his arm has remained very swollen, but it isn't bothering him. No pain or tingling. 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 In the 1-2 weeks prior to symptom onset, he was digging out an old tree stump that did involve swinging an axe over his head repeatedly. He is tolerating Eliquis well with no abnormal bleeding or bruising and no missed doses.   04/13/23: Patient returns to clinic to review his CT scan of his chest.  Surprisingly a mass was identified in the left thoracic outlet abutting the vascular structures.  This was nonspecific on CT scan and so formal diagnosis could not Jenkins given.  Reviewed the differential diagnosis and my worry for recurrence of head and neck cancer.  We made a plan to obtain an MR scan to better evaluate this finding.  Past Medical History:  Diagnosis Date    Abdominal pain 03/19/2016   Allergic rhinitis    Cancer of tonsillar fossa (HCC)    left   Clotting disorder (HCC)    PE, suspect lupus anticoagulant   Drug-induced skin rash 12/25/2015   History of nephrolithiasis    History of pulmonary embolism    History of radiation therapy 12/17/2015- 02/03/16   Left Tonsil and Bilateral Neck   Hypothyroidism    Mucositis oral 01/08/2016   Thyroid disease    Tonsil cancer [facility])     Past Surgical History:  Procedure Laterality Date   AMPUTATION Left 01/01/2018   Procedure: REVISION AMPUTATION RING FINGER;  Surgeon: Bradly Bienenstock, MD;  Location: [facility] OR;  Service: Orthopedics;  Laterality: Left;   CARPAL TUNNEL RELEASE Left 11/14/2021   Procedure: LEFT CARPAL TUNNEL RELEASE;  Surgeon: Betha Loa, MD;  Location: [facility];  Service: Orthopedics;  Laterality: Left;  30 MIN   EYE SURGERY Right 1980's   H/O right orbital "blowout" fracture   FOOT SURGERY Left    [facility] Neuroma   GASTROSTOMY TUBE PLACEMENT     IR GASTROSTOMY TUBE REMOVAL     IR GENERIC HISTORICAL  10/12/2016   IR REMOVAL TUN ACCESS W/ PORT W/O FL MOD SED 10/12/2016 Gilmer Mor, DO [facility]INTER[facility] RAD   KNEE SURGERY Right    Arthroscopic   PORTA CATH INSERTION      Family History  Problem Relation Age of Onset   Cancer Father        esophageal ca  Cancer Maternal Grandmother        brain ca   Cancer Maternal Grandfather        lung ca   COPD Mother    Cancer Maternal Aunt        breast ca    Social History   Socioeconomic History   Marital status: Married    Spouse name: Not on file   Number of children: 3   Years of education: Not on file   Highest education level: Not on file  Occupational History   Not on file  Tobacco Use   Smoking status: Never   Smokeless tobacco: Former    Types: Chew    Quit date: 11/20/2006  Vaping Use   Vaping Use: Never used  Substance and Sexual Activity   Alcohol use: Yes    Alcohol/week: 0.0 standard drinks of  alcohol    Comment: He reports he is a social drinker   Drug use: No   Sexual activity: Not on file  Other Topics Concern   Not on file  Social History Narrative   Not on file   Social Determinants of Health   Financial Resource Strain: Not on file  Food Insecurity: Not on file  Transportation Needs: Not on file  Physical Activity: Not on file  Stress: Not on file  Social Connections: Not on file  Intimate Partner Violence: Not on file    No Known Allergies  Current Outpatient Medications  Medication Sig Dispense Refill   apixaban (ELIQUIS) 5 MG TABS tablet Take 1 tablet (5 mg total) by mouth 2 (two) times daily. 60 tablet 5   aspirin EC 81 MG tablet Take 81 mg by mouth daily.     atorvastatin (LIPITOR) 10 MG tablet Take 10 mg by mouth daily.     Cyanocobalamin 1000 MCG/15ML LIQD Inject 1,000 mcg into the muscle every 30 (thirty) days.     ipratropium (ATROVENT) 0.06 % nasal spray Place 2 sprays into both nostrils every morning.     levothyroxine (SYNTHROID) 125 MCG tablet Take 125 mcg by mouth daily.   3   No current facility-administered medications for this visit.    PHYSICAL EXAM Vitals:   04/13/23 0900  BP: 136/69  Pulse: 60  Temp: 97.9 F (36.6 C)  TempSrc: Temporal  SpO2: 99%  Weight: 183 lb (83 kg)    Well-appearing man in no acute distress Regular rate and rhythm Unlabored breathing Swelling in the left arm is improved overall Radial pulses are palpable  PERTINENT LABORATORY AND RADIOLOGIC DATA  Most recent CBC    Latest Ref Rng & Units 04/07/2019    8:59 AM 04/01/2018    8:20 AM 10/12/2016   12:02 PM  CBC  WBC 4.0 - 10.5 K/uL 3.4  2.9  7.4   Hemoglobin 13.0 - 17.0 g/dL 40.9  81.1  91.4   Hematocrit 39.0 - 52.0 % 41.0  41.3  43.3   Platelets 150 - 400 K/uL 165  164  157      Most recent CMP    Latest Ref Rng & Units 04/07/2019    8:59 AM 04/01/2018    8:20 AM 10/12/2016   12:02 PM  CMP  Glucose 70 - 99 mg/dL 782  956  213   BUN 8 - 23  mg/dL 18  18  19    Creatinine 0.61 - 1.24 mg/dL 0.86  5.78  4.69   Sodium 135 - 145 mmol/L 140  140  140  Potassium 3.5 - 5.1 mmol/L 4.3  4.6  4.6   Chloride 98 - 111 mmol/L 108  104  104   CO2 22 - 32 mmol/L 26  28  31    Calcium 8.9 - 10.3 mg/dL 9.0  9.2  9.1   Total Protein 6.5 - 8.1 g/dL 6.6  6.9    Total Bilirubin 0.3 - 1.2 mg/dL 0.4  0.6    Alkaline Phos 38 - 126 U/L 70  74    AST 15 - 41 U/L 16  19    ALT 0 - 44 U/L 16  13     CT scan personally reviewed. I agree with the reading radiologist. There is an unexpected abnormality in the thoracic outlet causing some mass effect.   Rande Brunt. Lenell Antu, MD FACS [facility] Phone Number: [phone] 04/13/2023 12:00 PM   Total time spent on preparing this encounter including chart review, data review, collecting history, examining the patient, coordinating care for this established patient, 40 minutes.  Portions of this report may have been transcribed using voice recognition software.  Every effort has been made to ensure accuracy; however, inadvertent computerized transcription errors may still Jenkins present.

## 2023-04-14 ENCOUNTER — Other Ambulatory Visit (INDEPENDENT_AMBULATORY_CARE_PROVIDER_SITE_OTHER): Payer: 59 | Admitting: Vascular Surgery

## 2023-04-14 DIAGNOSIS — I82622 Acute embolism and thrombosis of deep veins of left upper extremity: Secondary | ICD-10-CM

## 2023-04-14 DIAGNOSIS — R222 Localized swelling, mass and lump, trunk: Secondary | ICD-10-CM

## 2023-04-14 NOTE — Progress Notes (Signed)
I had a detailed discussion with Dr. Molli Posey of radiology.  We reviewed his CT scan of his chest together.  He also reviewed the scan with his interventional colleagues.  We cannot define what is in the left subclavicular space on CT scan.  We reviewed options for imaging this.  Overall, my highest concern is recurrence of his head neck cancer and the periclavicular nodes.  We discussed using PET/CT scan, but felt the active clot in his subclavian vein may obscure this study or give a false positive.  We agreed to try MR venogram of the chest to better evaluate this area with a different modality.  Should this fail to be diagnostic, we will engage the interventional radiology colleagues to try to sample this abnormality and evaluated pathologically.  I have rewritten the order.  I will discuss this with my office to help facilitate scheduling.  Rande Brunt. Lenell Antu, MD [facility] Phone Number: [phone] 04/14/2023 12:04 PM

## 2023-04-15 ENCOUNTER — Encounter: Payer: Self-pay | Admitting: Vascular Surgery

## 2023-04-15 ENCOUNTER — Other Ambulatory Visit: Payer: Self-pay

## 2023-04-15 DIAGNOSIS — R222 Localized swelling, mass and lump, trunk: Secondary | ICD-10-CM

## 2023-04-15 DIAGNOSIS — R9389 Abnormal findings on diagnostic imaging of other specified body structures: Secondary | ICD-10-CM

## 2023-04-18 ENCOUNTER — Ambulatory Visit (HOSPITAL_COMMUNITY): Admission: RE | Admit: 2023-04-18 | Payer: 59 | Source: Ambulatory Visit

## 2023-04-18 ENCOUNTER — Encounter (HOSPITAL_COMMUNITY): Payer: Self-pay

## 2023-04-18 ENCOUNTER — Ambulatory Visit (HOSPITAL_COMMUNITY)
Admission: RE | Admit: 2023-04-18 | Discharge: 2023-04-18 | Disposition: A | Discharge status: Home / Self Care | Payer: 59 | Source: Ambulatory Visit | Attending: Vascular Surgery | Admitting: Vascular Surgery

## 2023-04-18 DIAGNOSIS — R222 Localized swelling, mass and lump, trunk: Secondary | ICD-10-CM | POA: Diagnosis present

## 2023-04-18 DIAGNOSIS — R9389 Abnormal findings on diagnostic imaging of other specified body structures: Secondary | ICD-10-CM | POA: Diagnosis present

## 2023-04-18 MED ORDER — GADOBUTROL 1 MMOL/ML IV SOLN
8.0000 mL | Freq: Once | INTRAVENOUS | Status: AC | PRN
  Administered 2023-04-18: 8 mL via INTRAVENOUS

## 2023-04-26 NOTE — Progress Notes (Unsigned)
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 CT scan worrisome for non-specific mass in left thoracic outlet.  I reviewed the differential diagnosis in this area.  I am most concerned given his history of head/neck cancer that this could Jenkins recurrence in a periclavicular node.  Will check an MRI of left shoulder and neck to better evaluate.   CHIEF COMPLAINT: Left arm swelling  HISTORY OF PRESENT ILLNESS: Gregory Jenkins is a 72 y.o. 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 Patient reports that last Monday 03/15/23 he woke up with his left arm swollen. Ultrasound at that time showed acute DVT involving the left subclavian, brachial, and axillary veins. He was started on Eliquis by his PCP and referred to the D[facility] Clinic for further work up and management.    Today, patient reports that his arm has remained very swollen, but it isn't bothering him. No pain or tingling. 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 In the 1-2 weeks prior to symptom onset, he was digging out an old tree stump that did involve swinging an axe over his head repeatedly. He is tolerating Eliquis well with no abnormal bleeding or bruising and no missed doses.   04/13/23: Patient returns to clinic to review his CT scan of his chest.  Surprisingly a mass was identified in the left thoracic outlet abutting the vascular structures.  This was nonspecific on CT scan and so formal diagnosis could not Jenkins given.  Reviewed the differential diagnosis and my worry for recurrence of head and neck cancer.  We made a plan to obtain an MR scan to better evaluate this finding.  Past Medical History:  Diagnosis Date    Abdominal pain 03/19/2016   Allergic rhinitis    Cancer of tonsillar fossa (HCC)    left   Clotting disorder (HCC)    PE, suspect lupus anticoagulant   Drug-induced skin rash 12/25/2015   History of nephrolithiasis    History of pulmonary embolism    History of radiation therapy 12/17/2015- 02/03/16   Left Tonsil and Bilateral Neck   Hypothyroidism    Mucositis oral 01/08/2016   Thyroid disease    Tonsil cancer [facility])     Past Surgical History:  Procedure Laterality Date   AMPUTATION Left 01/01/2018   Procedure: REVISION AMPUTATION RING FINGER;  Surgeon: Bradly Bienenstock, MD;  Location: [facility] OR;  Service: Orthopedics;  Laterality: Left;   CARPAL TUNNEL RELEASE Left 11/14/2021   Procedure: LEFT CARPAL TUNNEL RELEASE;  Surgeon: Betha Loa, MD;  Location: [facility];  Service: Orthopedics;  Laterality: Left;  30 MIN   EYE SURGERY Right 1980's   H/O right orbital "blowout" fracture   FOOT SURGERY Left    [facility] Neuroma   GASTROSTOMY TUBE PLACEMENT     IR GASTROSTOMY TUBE REMOVAL     IR GENERIC HISTORICAL  10/12/2016   IR REMOVAL TUN ACCESS W/ PORT W/O FL MOD SED 10/12/2016 Gilmer Mor, DO [facility]-INTER[facility] RAD   KNEE SURGERY Right    Arthroscopic   PORTA CATH INSERTION      Family History  Problem Relation Age of Onset   Cancer Father        esophageal ca  Cancer Maternal Grandmother        brain ca   Cancer Maternal Grandfather        lung ca   COPD Mother    Cancer Maternal Aunt        breast ca    Social History   Socioeconomic History   Marital status: Married    Spouse name: Not on file   Number of children: 3   Years of education: Not on file   Highest education level: Not on file  Occupational History   Not on file  Tobacco Use   Smoking status: Never   Smokeless tobacco: Former    Types: Chew    Quit date: 11/20/2006  Vaping Use   Vaping Use: Never used  Substance and Sexual Activity   Alcohol use: Yes    Alcohol/week: 0.0 standard drinks of  alcohol    Comment: He reports he is a social drinker   Drug use: No   Sexual activity: Not on file  Other Topics Concern   Not on file  Social History Narrative   Not on file   Social Determinants of Health   Financial Resource Strain: Not on file  Food Insecurity: Not on file  Transportation Needs: Not on file  Physical Activity: Not on file  Stress: Not on file  Social Connections: Not on file  Intimate Partner Violence: Not on file    No Known Allergies  Current Outpatient Medications  Medication Sig Dispense Refill   apixaban (ELIQUIS) 5 MG TABS tablet Take 1 tablet (5 mg total) by mouth 2 (two) times daily. 60 tablet 5   aspirin EC 81 MG tablet Take 81 mg by mouth daily.     atorvastatin (LIPITOR) 10 MG tablet Take 10 mg by mouth daily.     Cyanocobalamin 1000 MCG/15ML LIQD Inject 1,000 mcg into the muscle every 30 (thirty) days.     ipratropium (ATROVENT) 0.06 % nasal spray Place 2 sprays into both nostrils every morning.     levothyroxine (SYNTHROID) 125 MCG tablet Take 125 mcg by mouth daily.   3   No current facility-administered medications for this visit.    PHYSICAL EXAM There were no vitals filed for this visit.   Well-appearing man in no acute distress Regular rate and rhythm Unlabored breathing Swelling in the left arm is improved overall Radial pulses are palpable  PERTINENT LABORATORY AND RADIOLOGIC DATA  Most recent CBC    Latest Ref Rng & Units 04/07/2019    8:59 AM 04/01/2018    8:20 AM 10/12/2016   12:02 PM  CBC  WBC 4.0 - 10.5 K/uL 3.4  2.9  7.4   Hemoglobin 13.0 - 17.0 g/dL 96.0  45.4  09.8   Hematocrit 39.0 - 52.0 % 41.0  41.3  43.3   Platelets 150 - 400 K/uL 165  164  157      Most recent CMP    Latest Ref Rng & Units 04/07/2019    8:59 AM 04/01/2018    8:20 AM 10/12/2016   12:02 PM  CMP  Glucose 70 - 99 mg/dL 119  147  829   BUN 8 - 23 mg/dL 18  18  19    Creatinine 0.61 - 1.24 mg/dL 5.62  1.30  8.65   Sodium 135 - 145 mmol/L  140  140  140   Potassium 3.5 - 5.1 mmol/L 4.3  4.6  4.6   Chloride 98 - 111 mmol/L 108  104  104  CO2 22 - 32 mmol/L 26  28  31    Calcium 8.9 - 10.3 mg/dL 9.0  9.2  9.1   Total Protein 6.5 - 8.1 g/dL 6.6  6.9    Total Bilirubin 0.3 - 1.2 mg/dL 0.4  0.6    Alkaline Phos 38 - 126 U/L 70  74    AST 15 - 41 U/L 16  19    ALT 0 - 44 U/L 16  13     CT scan personally reviewed. I agree with the reading radiologist. There is an unexpected abnormality in the thoracic outlet causing some mass effect.   Rande Brunt. Lenell Antu, MD FACS [facility] Phone Number: [phone] 04/26/2023 9:15 AM   Total time spent on preparing this encounter including chart review, data review, collecting history, examining the patient, coordinating care for this established patient, 40 minutes.  Portions of this report may have been transcribed using voice recognition software.  Every effort has been made to ensure accuracy; however, inadvertent computerized transcription errors may still Jenkins present.

## 2023-04-27 ENCOUNTER — Encounter: Payer: Self-pay | Admitting: Vascular Surgery

## 2023-04-27 ENCOUNTER — Ambulatory Visit (INDEPENDENT_AMBULATORY_CARE_PROVIDER_SITE_OTHER): Payer: 59 | Admitting: Vascular Surgery

## 2023-04-27 VITALS — BP 108/63 | HR 61 | Temp 98.0°F | Resp 20 | Ht 72.0 in | Wt 180.0 lb

## 2023-04-27 DIAGNOSIS — I871 Compression of vein: Secondary | ICD-10-CM

## 2023-04-28 ENCOUNTER — Other Ambulatory Visit: Payer: Self-pay

## 2023-04-28 DIAGNOSIS — I871 Compression of vein: Secondary | ICD-10-CM

## 2023-04-28 DIAGNOSIS — Z7901 Long term (current) use of anticoagulants: Secondary | ICD-10-CM

## 2023-05-24 NOTE — Pre-Procedure Instructions (Signed)
Surgical Instructions   Your procedure is scheduled on Monday, August 5th. Report to [facility] Main Entrance "A" at 05:30 A.M., then check in with the Admitting office. Any questions or running late day of surgery: call [phone]  Questions prior to your surgery date: call [phone], Monday-Friday, 8am-4pm. If you experience any cold or flu symptoms such as cough, fever, chills, shortness of breath, etc. between now and your scheduled surgery, please notify us at the above number.     Remember:  Do not eat or drink after midnight the night before your surgery     Take these medicines the morning of surgery with A SIP OF WATER  aspirin  atorvastatin (LIPITOR)  ipratropium (ATROVENT) nasal spray levothyroxine (SYNTHROID)    YOU WILL NEED TO HOLD YOUR  ELIQUIS FOR 2 DAYS BEFORE YOUR PROCEDURE. TAKE YOUR LAST DOSE ON 05/28/23.  One week prior to surgery, STOP taking any Aleve, Naproxen, Ibuprofen, Motrin, Advil, Goody's, BC's, all herbal medications, fish oil, and non-prescription vitamins.                     Do NOT Smoke (Tobacco/Vaping) for 24 hours prior to your procedure.  If you use a CPAP at night, you may bring your mask/headgear for your overnight stay.   You will be asked to remove any contacts, glasses, piercing's, hearing aid's, dentures/partials prior to surgery. Please bring cases for these items if needed.    Patients discharged the day of surgery will not be allowed to drive home, and someone needs to stay with them for 24 hours.  SURGICAL WAITING ROOM VISITATION Patients may have no more than 2 support people in the waiting area - these visitors may rotate.   Pre-op nurse will coordinate an appropriate time for 1 ADULT support person, who may not rotate, to accompany patient in pre-op.  Children under the age of 31 must have an adult with them who is not the patient and must remain in the main waiting area with an adult.  If the patient needs to stay at the  hospital during part of their recovery, the visitor guidelines for inpatient rooms apply.  Please refer to the [facility] website for the visitor guidelines for any additional information.   If you received a COVID test during your pre-op visit  it is requested that you wear a mask when out in public, stay away from anyone that may not be feeling well and notify your surgeon if you develop symptoms. If you have been in contact with anyone that has tested positive in the last 10 days please notify you surgeon.      Pre-operative CHG Bathing Instructions   You can play a key role in reducing the risk of infection after surgery. Your skin needs to be as free of germs as possible. You can reduce the number of germs on your skin by washing with CHG (chlorhexidine gluconate) soap before surgery. CHG is an antiseptic soap that kills germs and continues to kill germs even after washing.   DO NOT use if you have an allergy to chlorhexidine/CHG or antibacterial soaps. If your skin becomes reddened or irritated, stop using the CHG and notify one of our RNs at [phone].              TAKE A SHOWER THE NIGHT BEFORE SURGERY AND THE DAY OF SURGERY    Please keep in mind the following:  DO NOT shave, including legs and underarms, 48 hours  prior to surgery.   You may shave your face before/day of surgery.  Place clean sheets on your bed the night before surgery Use a clean washcloth (not used since being washed) for each shower. DO NOT sleep with pet's night before surgery.  CHG Shower Instructions:  If you choose to wash your hair and private area, wash first with your normal shampoo/soap.  After you use shampoo/soap, rinse your hair and body thoroughly to remove shampoo/soap residue.  Turn the water OFF and apply half the bottle of CHG soap to a CLEAN washcloth.  Apply CHG soap ONLY FROM YOUR NECK DOWN TO YOUR TOES (washing for 3-5 minutes)  DO NOT use CHG soap on face, private areas, open wounds,  or sores.  Pay special attention to the area where your surgery is being performed.  If you are having back surgery, having someone wash your back for you may be helpful. Wait 2 minutes after CHG soap is applied, then you may rinse off the CHG soap.  Pat dry with a clean towel  Put on clean pajamas    Additional instructions for the day of surgery: DO NOT APPLY any lotions, deodorants, cologne, or perfumes.   Do not wear jewelry or makeup Do not wear nail polish, gel polish, artificial nails, or any other type of covering on natural nails (fingers and toes) Do not bring valuables to the hospital. [facility] is not responsible for valuables/personal belongings. Put on clean/comfortable clothes.  Please brush your teeth.  Ask your nurse before applying any prescription medications to the skin.

## 2023-05-25 ENCOUNTER — Other Ambulatory Visit: Payer: Self-pay

## 2023-05-25 ENCOUNTER — Encounter (HOSPITAL_COMMUNITY)
Admission: RE | Admit: 2023-05-25 | Discharge: 2023-05-25 | Disposition: A | Discharge status: Home / Self Care | Payer: 59 | Source: Ambulatory Visit | Attending: Vascular Surgery | Admitting: Vascular Surgery

## 2023-05-25 ENCOUNTER — Encounter (HOSPITAL_COMMUNITY): Payer: Self-pay

## 2023-05-25 VITALS — BP 127/66 | HR 59 | Temp 98.3°F | Resp 17 | Ht 72.0 in | Wt 181.7 lb

## 2023-05-25 DIAGNOSIS — I871 Compression of vein: Secondary | ICD-10-CM | POA: Diagnosis not present

## 2023-05-25 DIAGNOSIS — Z1152 Encounter for screening for COVID-19: Secondary | ICD-10-CM | POA: Insufficient documentation

## 2023-05-25 DIAGNOSIS — Z01818 Encounter for other preprocedural examination: Secondary | ICD-10-CM

## 2023-05-25 DIAGNOSIS — Z7901 Long term (current) use of anticoagulants: Secondary | ICD-10-CM | POA: Diagnosis not present

## 2023-05-25 DIAGNOSIS — Z01812 Encounter for preprocedural laboratory examination: Secondary | ICD-10-CM | POA: Diagnosis present

## 2023-05-25 LAB — URINALYSIS, ROUTINE W REFLEX MICROSCOPIC
Bacteria, UA: NONE SEEN
Bilirubin Urine: NEGATIVE
Glucose, UA: NEGATIVE mg/dL
Ketones, ur: NEGATIVE mg/dL
Leukocytes,Ua: NEGATIVE
Nitrite: NEGATIVE
Protein, ur: NEGATIVE mg/dL
Specific Gravity, Urine: 1.011 (ref 1.005–1.030)
pH: 5 (ref 5.0–8.0)

## 2023-05-25 LAB — TYPE AND SCREEN
ABO/RH(D): B POS
Antibody Screen: NEGATIVE

## 2023-05-25 LAB — COMPREHENSIVE METABOLIC PANEL
ALT: 19 U/L (ref 0–44)
AST: 16 U/L (ref 15–41)
Albumin: 3.6 g/dL (ref 3.5–5.0)
Alkaline Phosphatase: 71 U/L (ref 38–126)
Anion gap: 7 (ref 5–15)
BUN: 14 mg/dL (ref 8–23)
CO2: 28 mmol/L (ref 22–32)
Calcium: 9.4 mg/dL (ref 8.9–10.3)
Chloride: 102 mmol/L (ref 98–111)
Creatinine, Ser: 1.07 mg/dL (ref 0.61–1.24)
GFR, Estimated: >60 mL/min (ref >60)
Glucose, Bld: 103 mg/dL — ABNORMAL HIGH (ref 70–99)
Potassium: 4.6 mmol/L (ref 3.5–5.1)
Sodium: 137 mmol/L (ref 135–145)
Total Bilirubin: 0.3 mg/dL (ref 0.3–1.2)
Total Protein: 6.9 g/dL (ref 6.5–8.1)

## 2023-05-25 LAB — CBC
HCT: 43.1 % (ref 39.0–52.0)
Hemoglobin: 14.1 g/dL (ref 13.0–17.0)
MCH: 31.1 pg (ref 26.0–34.0)
MCHC: 32.7 g/dL (ref 30.0–36.0)
MCV: 95.1 fL (ref 80.0–100.0)
Platelets: 194 10*3/uL (ref 150–400)
RBC: 4.53 MIL/uL (ref 4.22–5.81)
RDW: 12.9 % (ref 11.5–15.5)
WBC: 6.5 10*3/uL (ref 4.0–10.5)
nRBC: 0 % (ref 0.0–0.2)

## 2023-05-25 LAB — APTT: aPTT: 27 seconds (ref 24–36)

## 2023-05-25 LAB — SURGICAL PCR SCREEN
MRSA, PCR: NEGATIVE
Staphylococcus aureus: NEGATIVE

## 2023-05-25 LAB — PROTIME-INR
INR: 1.1 (ref 0.8–1.2)
Prothrombin Time: 14.2 seconds (ref 11.4–15.2)

## 2023-05-25 NOTE — Progress Notes (Signed)
PCP - Meridee Score, FNP Cardiologist - denies  PPM/ICD - denies Device Orders - n/a Rep Notified - n/a  Chest x-ray - denies EKG - n/a Stress Test - denies ECHO - 09/13/2009 Cardiac Cath - denies  Sleep Study - over 20 years ago CPAP - no; pt states it didn't work for him  Fasting Blood Sugar - no DM Checks Blood Sugar _____ times a day  Last dose of GLP1 agonist-  n/a GLP1 instructions: n/a  Blood Thinner Instructions: hold Eliquis 2 days prior to sx. Last dose - 05/28/2023  Aspirin Instructions: follow surgeon's instructions on when to stop Aspirin.   ERAS Protcol - no PRE-SURGERY Ensure or G2- n/a  COVID TEST- 05/25/2023   Anesthesia review: no  Patient denies shortness of breath, fever, cough and chest pain at PAT appointment and the last 3 months   All instructions explained to the patient, with a verbal understanding of the material. Patient agrees to go over the instructions while at home for a better understanding. Patient also instructed to self quarantine after being tested for COVID-19. The opportunity to ask questions was provided.

## 2023-05-31 ENCOUNTER — Inpatient Hospital Stay (HOSPITAL_COMMUNITY)
Admission: RE | Admit: 2023-05-31 | Discharge: 2023-06-01 | DRG: 982 | Disposition: A | Discharge status: Home / Self Care | Payer: 59 | Attending: Vascular Surgery | Admitting: Vascular Surgery

## 2023-05-31 ENCOUNTER — Other Ambulatory Visit: Payer: Self-pay

## 2023-05-31 ENCOUNTER — Encounter (HOSPITAL_COMMUNITY)
Admission: RE | Disposition: A | Discharge status: Home / Self Care | Payer: Self-pay | Source: Home / Self Care | Attending: Vascular Surgery

## 2023-05-31 ENCOUNTER — Inpatient Hospital Stay (HOSPITAL_COMMUNITY): Payer: 59

## 2023-05-31 ENCOUNTER — Encounter (HOSPITAL_COMMUNITY): Payer: Self-pay | Admitting: Vascular Surgery

## 2023-05-31 ENCOUNTER — Inpatient Hospital Stay (HOSPITAL_COMMUNITY): Payer: Medicare Other

## 2023-05-31 DIAGNOSIS — Z87891 Personal history of nicotine dependence: Secondary | ICD-10-CM

## 2023-05-31 DIAGNOSIS — Z801 Family history of malignant neoplasm of trachea, bronchus and lung: Secondary | ICD-10-CM | POA: Diagnosis not present

## 2023-05-31 DIAGNOSIS — Z7901 Long term (current) use of anticoagulants: Secondary | ICD-10-CM

## 2023-05-31 DIAGNOSIS — Z800 Family history of malignant neoplasm of digestive organs: Secondary | ICD-10-CM

## 2023-05-31 DIAGNOSIS — Z86711 Personal history of pulmonary embolism: Secondary | ICD-10-CM

## 2023-05-31 DIAGNOSIS — Z85818 Personal history of malignant neoplasm of other sites of lip, oral cavity, and pharynx: Secondary | ICD-10-CM | POA: Diagnosis not present

## 2023-05-31 DIAGNOSIS — Z803 Family history of malignant neoplasm of breast: Secondary | ICD-10-CM

## 2023-05-31 DIAGNOSIS — I871 Compression of vein: Secondary | ICD-10-CM | POA: Diagnosis present

## 2023-05-31 DIAGNOSIS — D6862 Lupus anticoagulant syndrome: Secondary | ICD-10-CM | POA: Diagnosis present

## 2023-05-31 DIAGNOSIS — Z87442 Personal history of urinary calculi: Secondary | ICD-10-CM

## 2023-05-31 DIAGNOSIS — G540 Brachial plexus disorders: Principal | ICD-10-CM

## 2023-05-31 DIAGNOSIS — Z825 Family history of asthma and other chronic lower respiratory diseases: Secondary | ICD-10-CM | POA: Diagnosis not present

## 2023-05-31 DIAGNOSIS — E039 Hypothyroidism, unspecified: Secondary | ICD-10-CM | POA: Diagnosis present

## 2023-05-31 DIAGNOSIS — Z89022 Acquired absence of left finger(s): Secondary | ICD-10-CM

## 2023-05-31 DIAGNOSIS — Z808 Family history of malignant neoplasm of other organs or systems: Secondary | ICD-10-CM | POA: Diagnosis not present

## 2023-05-31 DIAGNOSIS — C779 Secondary and unspecified malignant neoplasm of lymph node, unspecified: Secondary | ICD-10-CM | POA: Diagnosis present

## 2023-05-31 DIAGNOSIS — Z9221 Personal history of antineoplastic chemotherapy: Secondary | ICD-10-CM | POA: Diagnosis not present

## 2023-05-31 DIAGNOSIS — Z86718 Personal history of other venous thrombosis and embolism: Secondary | ICD-10-CM | POA: Diagnosis not present

## 2023-05-31 DIAGNOSIS — Z923 Personal history of irradiation: Secondary | ICD-10-CM

## 2023-05-31 HISTORY — PX: RIB RESECTION: SHX5077

## 2023-05-31 HISTORY — PX: VENOGRAM: SHX5497

## 2023-05-31 LAB — COMPREHENSIVE METABOLIC PANEL
ALT: 18 U/L (ref 0–44)
AST: 19 U/L (ref 15–41)
Albumin: 3.4 g/dL — ABNORMAL LOW (ref 3.5–5.0)
Alkaline Phosphatase: 70 U/L (ref 38–126)
Anion gap: 7 (ref 5–15)
BUN: 11 mg/dL (ref 8–23)
CO2: 27 mmol/L (ref 22–32)
Calcium: 8.9 mg/dL (ref 8.9–10.3)
Chloride: 103 mmol/L (ref 98–111)
Creatinine, Ser: 1.07 mg/dL (ref 0.61–1.24)
GFR, Estimated: >60 mL/min (ref >60)
Glucose, Bld: 137 mg/dL — ABNORMAL HIGH (ref 70–99)
Potassium: 4.5 mmol/L (ref 3.5–5.1)
Sodium: 137 mmol/L (ref 135–145)
Total Bilirubin: 0.8 mg/dL (ref 0.3–1.2)
Total Protein: 6.7 g/dL (ref 6.5–8.1)

## 2023-05-31 LAB — CBC
HCT: 41.1 % (ref 39.0–52.0)
Hemoglobin: 13.5 g/dL (ref 13.0–17.0)
MCH: 31.1 pg (ref 26.0–34.0)
MCHC: 32.8 g/dL (ref 30.0–36.0)
MCV: 94.7 fL (ref 80.0–100.0)
Platelets: 176 10*3/uL (ref 150–400)
RBC: 4.34 MIL/uL (ref 4.22–5.81)
RDW: 12.8 % (ref 11.5–15.5)
WBC: 9.1 10*3/uL (ref 4.0–10.5)
nRBC: 0.4 % — ABNORMAL HIGH (ref 0.0–0.2)

## 2023-05-31 LAB — PROTIME-INR
INR: 0.9 (ref 0.8–1.2)
Prothrombin Time: 12.8 seconds (ref 11.4–15.2)

## 2023-05-31 SURGERY — EXCISION, RIB
Anesthesia: General | Site: Chest

## 2023-05-31 MED ORDER — ONDANSETRON HCL 4 MG/2ML IJ SOLN
INTRAMUSCULAR | Status: DC | PRN
Start: 2023-05-31 — End: 2023-05-31
  Administered 2023-05-31: 4 mg via INTRAVENOUS

## 2023-05-31 MED ORDER — CHLORHEXIDINE GLUCONATE 0.12 % MT SOLN
OROMUCOSAL | Status: AC
  Administered 2023-05-31: 15 mL
  Filled 2023-05-31: qty 15

## 2023-05-31 MED ORDER — FENTANYL CITRATE (PF) 100 MCG/2ML IJ SOLN
INTRAMUSCULAR | Status: AC
  Filled 2023-05-31: qty 2

## 2023-05-31 MED ORDER — OXYCODONE HCL 5 MG PO TABS
ORAL_TABLET | ORAL | Status: AC
  Filled 2023-05-31: qty 1

## 2023-05-31 MED ORDER — PHENYLEPHRINE 80 MCG/ML (10ML) SYRINGE FOR IV PUSH (FOR BLOOD PRESSURE SUPPORT)
PREFILLED_SYRINGE | INTRAVENOUS | Status: DC | PRN
  Administered 2023-05-31 (×2): 80 ug via INTRAVENOUS

## 2023-05-31 MED ORDER — EPHEDRINE SULFATE-NACL 50-0.9 MG/10ML-% IV SOSY
PREFILLED_SYRINGE | INTRAVENOUS | Status: DC | PRN
  Administered 2023-05-31: 5 mg via INTRAVENOUS

## 2023-05-31 MED ORDER — ACETAMINOPHEN 500 MG PO TABS: 1000.0000 mg | ORAL_TABLET | Freq: Once | ORAL | Status: DC | PRN

## 2023-05-31 MED ORDER — GUAIFENESIN-DM 100-10 MG/5ML PO SYRP: 15.0000 mL | ORAL_SOLUTION | ORAL | Status: DC | PRN

## 2023-05-31 MED ORDER — ROCURONIUM BROMIDE 10 MG/ML (PF) SYRINGE
PREFILLED_SYRINGE | INTRAVENOUS | Status: AC
  Filled 2023-05-31: qty 10

## 2023-05-31 MED ORDER — OXYCODONE HCL 5 MG PO TABS
5.0000 mg | ORAL_TABLET | ORAL | Status: DC | PRN
  Administered 2023-05-31 – 2023-06-01 (×2): 10 mg via ORAL
  Administered 2023-06-01: 5 mg via ORAL
  Filled 2023-05-31: qty 1
  Filled 2023-05-31 (×3): qty 2

## 2023-05-31 MED ORDER — EPHEDRINE 5 MG/ML INJ
INTRAVENOUS | Status: AC
  Filled 2023-05-31: qty 5

## 2023-05-31 MED ORDER — PHENYLEPHRINE 80 MCG/ML (10ML) SYRINGE FOR IV PUSH (FOR BLOOD PRESSURE SUPPORT)
PREFILLED_SYRINGE | INTRAVENOUS | Status: AC
  Filled 2023-05-31: qty 10

## 2023-05-31 MED ORDER — CEFAZOLIN SODIUM-DEXTROSE 2-4 GM/100ML-% IV SOLN
2.0000 g | INTRAVENOUS | Status: AC
  Administered 2023-05-31: 2 g via INTRAVENOUS
  Filled 2023-05-31: qty 100

## 2023-05-31 MED ORDER — CHLORHEXIDINE GLUCONATE CLOTH 2 % EX PADS: 6.0000 | MEDICATED_PAD | Freq: Once | CUTANEOUS | Status: DC

## 2023-05-31 MED ORDER — GLYCOPYRROLATE 0.2 MG/ML IJ SOLN
INTRAMUSCULAR | Status: DC | PRN
Start: 2023-05-31 — End: 2023-05-31
  Administered 2023-05-31 (×2): .1 mg via INTRAVENOUS

## 2023-05-31 MED ORDER — METOPROLOL TARTRATE 5 MG/5ML IV SOLN: 2.0000 mg | INTRAVENOUS | Status: DC | PRN

## 2023-05-31 MED ORDER — SODIUM CHLORIDE 0.9 % IV SOLN: INTRAVENOUS | Status: DC

## 2023-05-31 MED ORDER — GLYCOPYRROLATE PF 0.2 MG/ML IJ SOSY
PREFILLED_SYRINGE | INTRAMUSCULAR | Status: AC
  Filled 2023-05-31: qty 1

## 2023-05-31 MED ORDER — PROPOFOL 10 MG/ML IV BOLUS
INTRAVENOUS | Status: AC
  Filled 2023-05-31: qty 20

## 2023-05-31 MED ORDER — 0.9 % SODIUM CHLORIDE (POUR BTL) OPTIME
TOPICAL | Status: DC | PRN
  Administered 2023-05-31: 2000 mL

## 2023-05-31 MED ORDER — LABETALOL HCL 5 MG/ML IV SOLN: 10.0000 mg | INTRAVENOUS | Status: DC | PRN

## 2023-05-31 MED ORDER — DEXAMETHASONE SODIUM PHOSPHATE 10 MG/ML IJ SOLN
INTRAMUSCULAR | Status: DC | PRN
  Administered 2023-05-31: 10 mg via INTRAVENOUS

## 2023-05-31 MED ORDER — FENTANYL CITRATE (PF) 250 MCG/5ML IJ SOLN
INTRAMUSCULAR | Status: AC
  Filled 2023-05-31: qty 5

## 2023-05-31 MED ORDER — PHENYLEPHRINE HCL-NACL 20-0.9 MG/250ML-% IV SOLN
INTRAVENOUS | Status: DC | PRN
  Administered 2023-05-31: 50 ug/min via INTRAVENOUS
  Administered 2023-05-31: 25 ug/min via INTRAVENOUS

## 2023-05-31 MED ORDER — MORPHINE SULFATE (PF) 4 MG/ML IV SOLN: 4.0000 mg | INTRAVENOUS | Status: DC | PRN

## 2023-05-31 MED ORDER — ALUM & MAG HYDROXIDE-SIMETH 200-200-20 MG/5ML PO SUSP: 15.0000 mL | ORAL | Status: DC | PRN

## 2023-05-31 MED ORDER — ACETAMINOPHEN 160 MG/5ML PO SOLN: 1000.0000 mg | Freq: Once | ORAL | Status: DC | PRN

## 2023-05-31 MED ORDER — PHENOL 1.4 % MT LIQD: 1.0000 | OROMUCOSAL | Status: DC | PRN

## 2023-05-31 MED ORDER — POTASSIUM CHLORIDE CRYS ER 20 MEQ PO TBCR: 20.0000 meq | EXTENDED_RELEASE_TABLET | Freq: Once | ORAL | Status: DC

## 2023-05-31 MED ORDER — FENTANYL CITRATE (PF) 100 MCG/2ML IJ SOLN
25.0000 ug | INTRAMUSCULAR | Status: DC | PRN
  Administered 2023-05-31 (×3): 50 ug via INTRAVENOUS

## 2023-05-31 MED ORDER — PROPOFOL 10 MG/ML IV BOLUS
INTRAVENOUS | Status: DC | PRN
Start: 2023-05-31 — End: 2023-05-31
  Administered 2023-05-31: 100 mg via INTRAVENOUS

## 2023-05-31 MED ORDER — ROCURONIUM BROMIDE 10 MG/ML (PF) SYRINGE
PREFILLED_SYRINGE | INTRAVENOUS | Status: DC | PRN
  Administered 2023-05-31: 20 mg via INTRAVENOUS
  Administered 2023-05-31: 60 mg via INTRAVENOUS
  Administered 2023-05-31: 20 mg via INTRAVENOUS

## 2023-05-31 MED ORDER — ONDANSETRON HCL 4 MG/2ML IJ SOLN: 4.0000 mg | Freq: Four times a day (QID) | INTRAMUSCULAR | Status: DC | PRN

## 2023-05-31 MED ORDER — OXYCODONE HCL 5 MG/5ML PO SOLN: 5.0000 mg | Freq: Once | ORAL | Status: AC | PRN

## 2023-05-31 MED ORDER — OXYCODONE HCL 5 MG PO TABS
5.0000 mg | ORAL_TABLET | Freq: Once | ORAL | Status: AC | PRN
  Administered 2023-05-31: 5 mg via ORAL

## 2023-05-31 MED ORDER — HEPARIN SODIUM (PORCINE) 5000 UNIT/ML IJ SOLN
5000.0000 [IU] | Freq: Three times a day (TID) | INTRAMUSCULAR | Status: DC
  Administered 2023-05-31 – 2023-06-01 (×3): 5000 [IU] via SUBCUTANEOUS
  Filled 2023-05-31 (×3): qty 1

## 2023-05-31 MED ORDER — FENTANYL CITRATE (PF) 250 MCG/5ML IJ SOLN
INTRAMUSCULAR | Status: DC | PRN
  Administered 2023-05-31: 150 ug via INTRAVENOUS
  Administered 2023-05-31: 50 ug via INTRAVENOUS
  Administered 2023-05-31: 100 ug via INTRAVENOUS

## 2023-05-31 MED ORDER — ACETAMINOPHEN 325 MG PO TABS
650.0000 mg | ORAL_TABLET | Freq: Four times a day (QID) | ORAL | Status: DC
  Administered 2023-05-31 – 2023-06-01 (×5): 650 mg via ORAL
  Filled 2023-05-31 (×5): qty 2

## 2023-05-31 MED ORDER — PANTOPRAZOLE SODIUM 40 MG PO TBEC
40.0000 mg | DELAYED_RELEASE_TABLET | Freq: Every day | ORAL | Status: DC
  Administered 2023-05-31 – 2023-06-01 (×2): 40 mg via ORAL
  Filled 2023-05-31 (×2): qty 1

## 2023-05-31 MED ORDER — LIDOCAINE 2% (20 MG/ML) 5 ML SYRINGE
INTRAMUSCULAR | Status: AC
  Filled 2023-05-31: qty 5

## 2023-05-31 MED ORDER — ACETAMINOPHEN 10 MG/ML IV SOLN
1000.0000 mg | Freq: Once | INTRAVENOUS | Status: DC | PRN
  Administered 2023-05-31: 1000 mg via INTRAVENOUS

## 2023-05-31 MED ORDER — HEPARIN 6000 UNIT IRRIGATION SOLUTION
Status: AC
  Filled 2023-05-31: qty 500

## 2023-05-31 MED ORDER — SUGAMMADEX SODIUM 200 MG/2ML IV SOLN
INTRAVENOUS | Status: DC | PRN
  Administered 2023-05-31: 200 mg via INTRAVENOUS

## 2023-05-31 MED ORDER — IODIXANOL 320 MG/ML IV SOLN
INTRAVENOUS | Status: DC | PRN
  Administered 2023-05-31: 11 mL via INTRA_ARTERIAL

## 2023-05-31 MED ORDER — LACTATED RINGERS IV SOLN: INTRAVENOUS | Status: DC | PRN

## 2023-05-31 MED ORDER — HEMOSTATIC AGENTS (NO CHARGE) OPTIME
TOPICAL | Status: DC | PRN
  Administered 2023-05-31: 1 via TOPICAL

## 2023-05-31 MED ORDER — HEPARIN 6000 UNIT IRRIGATION SOLUTION
Status: DC | PRN
  Administered 2023-05-31: 1

## 2023-05-31 MED ORDER — LIDOCAINE 2% (20 MG/ML) 5 ML SYRINGE
INTRAMUSCULAR | Status: DC | PRN
  Administered 2023-05-31: 50 mg via INTRAVENOUS

## 2023-05-31 MED ORDER — METHOCARBAMOL 500 MG PO TABS
500.0000 mg | ORAL_TABLET | Freq: Three times a day (TID) | ORAL | Status: DC
  Administered 2023-05-31 – 2023-06-01 (×5): 500 mg via ORAL
  Filled 2023-05-31 (×5): qty 1

## 2023-05-31 MED ORDER — ACETAMINOPHEN 10 MG/ML IV SOLN
INTRAVENOUS | Status: AC
  Filled 2023-05-31: qty 100

## 2023-05-31 MED ORDER — HYDRALAZINE HCL 20 MG/ML IJ SOLN: 5.0000 mg | INTRAMUSCULAR | Status: DC | PRN

## 2023-05-31 SURGICAL SUPPLY — 50 items
APL SKNCLS STERI-STRIP NONHPOA (GAUZE/BANDAGES/DRESSINGS) ×2
BAG COUNTER SPONGE SURGICOUNT (BAG) ×2 IMPLANT
BAG SPNG CNTER NS LX DISP (BAG) ×2
BENZOIN TINCTURE PRP APPL 2/3 (GAUZE/BANDAGES/DRESSINGS) ×4 IMPLANT
BNDG COHESIVE 1X5 TAN STRL LF (GAUZE/BANDAGES/DRESSINGS) ×2 IMPLANT
BNDG COHESIVE 6X5 TAN NS LF (GAUZE/BANDAGES/DRESSINGS) ×2 IMPLANT
CATH ANGIO 5F BER2 65CM (CATHETERS) IMPLANT
CATH BEACON 5 .035 65 KMP TIP (CATHETERS) IMPLANT
CLIP LIGATING EXTRA MED SLVR (CLIP) ×2 IMPLANT
CLIP LIGATING EXTRA SM BLUE (MISCELLANEOUS) ×2 IMPLANT
CLSR STERI-STRIP ANTIMIC 1/2X4 (GAUZE/BANDAGES/DRESSINGS) IMPLANT
CNTNR URN SCR LID CUP LEK RST (MISCELLANEOUS) ×2 IMPLANT
CONT SPEC 4OZ STRL OR WHT (MISCELLANEOUS) ×2
COVER PROBE W GEL 5X96 (DRAPES) IMPLANT
DEVICE INFLATION ENCORE 26 (MISCELLANEOUS) IMPLANT
DRAIN CHANNEL 19F RND (DRAIN) IMPLANT
DRAIN CONNECTOR BLAKE 1:1 (MISCELLANEOUS) IMPLANT
DRAPE INCISE IOBAN 66X45 STRL (DRAPES) ×2 IMPLANT
DRAPE ORTHO SPLIT 87X125 STRL (DRAPES) ×2 IMPLANT
DRSG TEGADERM 4X4.75 (GAUZE/BANDAGES/DRESSINGS) IMPLANT
GAUZE SPONGE 4X4 12PLY STRL (GAUZE/BANDAGES/DRESSINGS) IMPLANT
GLIDEWIRE ADV .035X260CM (WIRE) IMPLANT
HEMOSTASIS OSTENE BONE 2.5 (HEMOSTASIS)
HEMOSTAT HEMOSTS OSTN BONE 2.5 (HEMOSTASIS) IMPLANT
HEMOSTAT SNOW SURGICEL 2X4 (HEMOSTASIS) IMPLANT
KIT BASIN OR (CUSTOM PROCEDURE TRAY) ×2 IMPLANT
NS IRRIG 1000ML POUR BTL (IV SOLUTION) ×2 IMPLANT
PACK CV ACCESS (CUSTOM PROCEDURE TRAY) ×2 IMPLANT
PACK UNIVERSAL I (CUSTOM PROCEDURE TRAY) ×2 IMPLANT
SET MICROPUNCTURE 5F STIFF (MISCELLANEOUS) IMPLANT
SPONGE INTESTINAL PEANUT (DISPOSABLE) ×2 IMPLANT
SPONGE T-LAP 18X18 ~~LOC~~+RFID (SPONGE) ×2 IMPLANT
STOCKINETTE 3IN STRL (GAUZE/BANDAGES/DRESSINGS) IMPLANT
STOCKINETTE 6 STRL (DRAPES) IMPLANT
STOCKINETTE IMPERVIOUS LG (DRAPES) ×2 IMPLANT
STOPCOCK 3WAY HIGH PRESSURE (MISCELLANEOUS) IMPLANT
STOPCOCK MORSE 400PSI 3WAY (MISCELLANEOUS) IMPLANT
STRIP CLOSURE SKIN 1/2X4 (GAUZE/BANDAGES/DRESSINGS) ×8 IMPLANT
SUT MNCRL AB 4-0 PS2 18 (SUTURE) ×2 IMPLANT
SUT SILK 1 MH (SUTURE) IMPLANT
SUT SILK 2 0SH CR/8 30 (SUTURE) IMPLANT
SUT VIC AB 2-0 CT1 27 (SUTURE) ×2
SUT VIC AB 2-0 CT1 TAPERPNT 27 (SUTURE) ×2 IMPLANT
SYR 10ML LL (SYRINGE) IMPLANT
SYSTEM SAHARA CHEST DRAIN ATS (WOUND CARE) IMPLANT
TOWEL GREEN STERILE (TOWEL DISPOSABLE) ×4 IMPLANT
TUBING CIL FLEX 10 FLL-RA (TUBING) IMPLANT
UNDERPAD 30X36 HEAVY ABSORB (UNDERPADS AND DIAPERS) ×2 IMPLANT
WATER STERILE IRR 1000ML POUR (IV SOLUTION) ×2 IMPLANT
WIRE BENTSON .035X145CM (WIRE) IMPLANT

## 2023-05-31 NOTE — H&P (Signed)
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  He may have a component of neurogenic thoracic outlet syndrome as well.  CT scan showed possible abnormality in periclavicular space that I was worried could be a head / neck cancer recurrence. MR chest performed and did not show any evidence of mass or abnormality in this space. I reviewed this extensively with the radiology team.   Plan to perform venogram in operating room. If no evidence of rib compression, will stop here. If vein compression, will proceed with left first rib excision.  CHIEF COMPLAINT: Left arm swelling  HISTORY OF PRESENT ILLNESS: Gregory Jenkins is a 72 y.o. 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 Patient reports that last Monday 03/15/23 he woke up with his left arm swollen. Ultrasound at that time showed acute DVT involving the left subclavian, brachial, and axillary veins. He was started on Eliquis by his PCP and referred to the DVT Clinic for further work up and management.    Today, patient reports that his arm has remained very swollen, but it isn't bothering him. No pain or tingling. 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 In the 1-2 weeks prior to symptom onset, he was digging out an old tree stump that did involve swinging an axe over his head repeatedly. He is tolerating Eliquis well with no abnormal bleeding or bruising and no missed doses.   04/13/23: Patient returns to clinic to review his CT scan of his chest.  Surprisingly a mass was identified in the left thoracic outlet abutting the vascular structures.  This was nonspecific on CT scan and so formal diagnosis could not be given.  Reviewed the  differential diagnosis and my worry for recurrence of head and neck cancer.  We made a plan to obtain an MR scan to better evaluate this finding.  7/2/4: Patient returns to clinic to review his MRI scan.  Thankfully MRI shows evidence of mass as question on CT scan.  I reviewed these scans with several radiologist over the past weeks.  I reviewed these discussions with the patient and his wife.  Patient is also describing some shoulder discomfort with radiating symptoms down the left arm.  I again reviewed the rationale for first rib excision.  05/31/23: presents for surgery. Questions reviewed. Counseled him about intraoperative venogram.  Past Medical History:  Diagnosis Date   Abdominal pain 03/19/2016   Allergic rhinitis    Cancer of tonsillar fossa (HCC)    left   Clotting disorder (HCC)    PE, suspect lupus anticoagulant   Drug-induced skin rash 12/25/2015   History of kidney stones 2000   History of nephrolithiasis    History of pulmonary embolism    History of radiation therapy 12/17/2015- 02/03/16   Left Tonsil and Bilateral Neck   Hypothyroidism    Mucositis oral 01/08/2016   Sleep apnea 2000   Thyroid disease    Tonsil cancer [facility])     Past Surgical History:  Procedure Laterality Date   AMPUTATION Left 01/01/2018   Procedure: REVISION AMPUTATION RING FINGER;  Surgeon: Bradly Bienenstock, MD;  Location: [facility] OR;  Service: Orthopedics;  Laterality: Left;   CARPAL TUNNEL RELEASE Left 11/14/2021   Procedure: LEFT  CARPAL TUNNEL RELEASE;  Surgeon: Betha Loa, MD;  Location: [facility];  Service: Orthopedics;  Laterality: Left;  30 MIN   EYE SURGERY Right 1980's   H/O right orbital "blowout" fracture   FOOT SURGERY Left    [facility] Neuroma   GASTROSTOMY TUBE PLACEMENT     IR GASTROSTOMY TUBE REMOVAL     IR GENERIC HISTORICAL  10/12/2016   IR REMOVAL TUN ACCESS W/ PORT W/O FL MOD SED 10/12/2016 Gilmer Mor, DO [facility]INTER[facility]   KNEE SURGERY Right    Arthroscopic   PORTA  CATH INSERTION      Family History  Problem Relation Age of Onset   Cancer Father        esophageal ca   Cancer Maternal Grandmother        brain ca   Cancer Maternal Grandfather        lung ca   COPD Mother    Cancer Maternal Aunt        breast ca    Social History   Socioeconomic History   Marital status: Married    Spouse name: Not on file   Number of children: 3   Years of education: Not on file   Highest education level: Not on file  Occupational History   Not on file  Tobacco Use   Smoking status: Never   Smokeless tobacco: Former    Types: Chew    Quit date: 11/20/2006  Vaping Use   Vaping status: Never Used  Substance and Sexual Activity   Alcohol use: Yes    Alcohol/week: 0.0 standard drinks of alcohol    Comment: He reports he is a social drinker   Drug use: No   Sexual activity: Not on file  Other Topics Concern   Not on file  Social History Narrative   Not on file   Social Determinants of Health   Financial Resource Strain: Not on file  Food Insecurity: Not on file  Transportation Needs: Not on file  Physical Activity: Not on file  Stress: Not on file  Social Connections: Not on file  Intimate Partner Violence: Not on file    No Known Allergies  Current Facility-Administered Medications  Medication Dose Route Frequency Provider Last Rate Last Admin   0.9 %  sodium chloride infusion   Intravenous Continuous Leonie Douglas, MD       0.9 % irrigation (POUR BTL)    PRN Leonie Douglas, MD   2,000 mL at 05/31/23 0743   ceFAZolin (ANCEF) IVPB 2g/100 mL premix  2 g Intravenous 30 min Pre-Op Leonie Douglas, MD       Chlorhexidine Gluconate Cloth 2 % PADS 6 each  6 each Topical Once Leonie Douglas, MD       And   Chlorhexidine Gluconate Cloth 2 % PADS 6 each  6 each Topical Once Leonie Douglas, MD        PHYSICAL EXAM Vitals:   05/31/23 0616  BP: 132/74  Pulse: 61  Resp: 18  Temp: 98.1 F (36.7 C)  TempSrc: Oral  SpO2: 95%   Weight: 77.1 kg  Height: 6' (1.829 m)     Well-appearing man in no acute distress Regular rate and rhythm Unlabored breathing Swelling in the left arm is improved overall Radial pulses are palpable  PERTINENT LABORATORY AND RADIOLOGIC DATA  Most recent CBC    Latest Ref Rng & Units 05/25/2023   10:28 AM 04/07/2019    8:59  AM 04/01/2018    8:20 AM  CBC  WBC 4.0 - 10.5 K/uL 6.5  3.4  2.9   Hemoglobin 13.0 - 17.0 g/dL 16.1  09.6  04.5   Hematocrit 39.0 - 52.0 % 43.1  41.0  41.3   Platelets 150 - 400 K/uL 194  165  164      Most recent CMP    Latest Ref Rng & Units 05/25/2023   10:28 AM 04/07/2019    8:59 AM 04/01/2018    8:20 AM  CMP  Glucose 70 - 99 mg/dL 409  811  914   BUN 8 - 23 mg/dL 14  18  18    Creatinine 0.61 - 1.24 mg/dL 7.82  9.56  2.13   Sodium 135 - 145 mmol/L 137  140  140   Potassium 3.5 - 5.1 mmol/L 4.6  4.3  4.6   Chloride 98 - 111 mmol/L 102  108  104   CO2 22 - 32 mmol/L 28  26  28    Calcium 8.9 - 10.3 mg/dL 9.4  9.0  9.2   Total Protein 6.5 - 8.1 g/dL 6.9  6.6  6.9   Total Bilirubin 0.3 - 1.2 mg/dL 0.3  0.4  0.6   Alkaline Phos 38 - 126 U/L 71  70  74   AST 15 - 41 U/L 16  16  19    ALT 0 - 44 U/L 19  16  13     CT scan personally reviewed. I agree with the reading radiologist. There is an unexpected abnormality in the thoracic outlet causing some mass effect.   MRI chest personally reviewed.  Rande Brunt. Lenell Antu, MD FACS [facility] Phone Number: [phone] 05/31/2023 7:44 AM   Total time spent on preparing this encounter including chart review, data review, collecting history, examining the patient, coordinating care for this established patient, 40 minutes.  Portions of this report may have been transcribed using voice recognition software.  Every effort has been made to ensure accuracy; however, inadvertent computerized transcription errors may still be present.

## 2023-05-31 NOTE — Anesthesia Procedure Notes (Addendum)
Procedure Name: Intubation Date/Time: 05/31/2023 8:00 AM  Performed by: Marena Chancy, CRNAPre-anesthesia Checklist: Patient identified, Emergency Drugs available, Suction available and Patient being monitored Patient Re-evaluated:Patient Re-evaluated prior to induction Oxygen Delivery Method: Circle System Utilized Preoxygenation: Pre-oxygenation with 100% oxygen Induction Type: IV induction Ventilation: Mask ventilation without difficulty Laryngoscope Size: Glidescope and 4 Grade View: Grade III Tube type: Oral Tube size: 7.5 mm Number of attempts: 1 Airway Equipment and Method: Stylet Placement Confirmation: ETT inserted through vocal cords under direct vision, positive ETCO2 and breath sounds checked- equal and bilateral Secured at: 22 cm Tube secured with: Tape Dental Injury: Teeth and Oropharynx as per pre-operative assessment

## 2023-05-31 NOTE — Transfer of Care (Signed)
Immediate Anesthesia Transfer of Care Note  Patient: Gregory Jenkins  Procedure(s) Performed: LEFT FIRST RIB EXCISION (Left) LEFT UPPER EXTREMITY AND CENTRAL VENOGRAM (Chest)  Patient Location: PACU  Anesthesia Type:General  Level of Consciousness: awake, alert , and oriented  Airway & Oxygen Therapy: Patient Spontanous Breathing and Patient connected to nasal cannula oxygen  Post-op Assessment: Report given to RN and Post -op Vital signs reviewed and stable  Post vital signs: Reviewed and stable  Last Vitals:  Vitals Value Taken Time  BP 151/75 05/31/23 1015  Temp    Pulse 60 05/31/23 1019  Resp 28 05/31/23 1019  SpO2 97 % 05/31/23 1019  Vitals shown include unfiled device data.  Last Pain:  Vitals:   05/31/23 0616  TempSrc: Oral  PainSc:          Complications: No notable events documented.

## 2023-05-31 NOTE — Progress Notes (Signed)
Pt arrived to rm 21 from PACU. Initiated tele. Oriented pt to the unit. VSS. Call bell within reach.  Lawson Radar, RN

## 2023-05-31 NOTE — Op Note (Signed)
DATE OF SERVICE: 05/31/2023  PATIENT:  Gregory Jenkins  72 y.o. male  PRE-OPERATIVE DIAGNOSIS:  left venous thoracic outlet syndrome  POST-OPERATIVE DIAGNOSIS:  Same  PROCEDURE:   Left first rib excision Left upper extremity venogram  SURGEON:  Surgeons and Role:    * Leonie Douglas, MD - Primary     ASSISTANT: Victorino Sparrow, MD - Assisting  An experienced assistant was required given the complexity of this procedure and the standard of surgical care. My assistant helped with exposure through counter tension, suctioning, ligation and retraction to better visualize the surgical field.  My assistant expedited sewing during the case by following my sutures. Wherever I use the term "we" in the report, my assistant actively helped me with that portion of the procedure.  ANESTHESIA:   general  EBL:  BLOOD ADMINISTERED:none  DRAINS: none   LOCAL MEDICATIONS USED:  NONE  SPECIMEN:  none  COUNTS: confirmed correct.  TOURNIQUET:  none  PATIENT DISPOSITION:  PACU - hemodynamically stable.   Delay start of Pharmacological VTE agent (>24hrs) due to surgical blood loss or risk of bleeding: no  INDICATION FOR PROCEDURE: Gregory Jenkins is a 72 y.o. male with left subclavian and axillary vein deep venous thrombosis concerning for his thoracic outlet syndrome. After careful discussion of risks, benefits, and alternatives the patient was offered left first rib excision and venogram. The patient  understood and wished to proceed.  OPERATIVE FINDINGS:  Venogram confirms compression of the subclavian vein by the first rib on the clavicle. Successful partial excision of the first rib from a infraclavicular approach.  Rib debrided to behind subclavian artery. Rent in the pleura made during rib excision.  No evidence of pleural injury.  No sign of pneumothorax on postoperative film. Patient awoke and PACU with normal motor and sensory function of the left upper extremity.  DESCRIPTION  OF PROCEDURE: After identification of the patient in the pre-operative holding area, the patient was transferred to the operating room. The patient was positioned supine on the operating room table. Anesthesia was induced. The left chest and arm were prepped and draped in standard fashion. A surgical pause was performed confirming correct patient, procedure, and operative location.  Using ultrasound guidance, the left basilic vein was accessed with micropuncture technique.  Using Seldinger technique and a micro sheath was introduced into the vein.  Venography was performed over the left thoracic outlet.  This showed significant compression with abduction of the arm.  We elected to proceed with first rib excision.  A transverse incision was made infraclavicularly about a fingerbreadth below the clavicle.  The incision was carried down through the subcutaneous tissue until the clavipectoral fascia was identified.  This was incised transversely.  The heads of the pectoralis major were split and we developed an avascular plane onto the chest wall.  We carried our exposure cranially and identified the first rib.  We carried our exposure laterally and identified the subclavian vein.  The subclavian vein was skeletonized and followed into the thoracic common.  A large, fibrotic, subclavius muscle was identified and debrided.  We identified the anterior scalene insertion and debrided this off of the anterior surface of the medial first rib.  All periosteal attachments of the first rib are freed with a combination of blunt and Bovie electrocautery.  We did create a small rent in the pleura but thankfully did not create a lung injury.  The rib was then transected at the costochondral junction.  The right hip was then debrided back to behind the subclavian artery.  The subclavian vessels and brachial plexus were protected with a Deaver during these maneuvers.  Once the rib was debrided satisfactorily, we obtained  hemostasis in the surgical bed.  The wound was flooded with saline.  No bubbles were noted suggesting a lung parenchymal injury.  Satisfied we ended the case here.  The wound was closed in layers using 3-0 Vicryl and 4 Monocryl.  The close suction drain was applied to the pleural rind and surgical bed.  This exited the skin and was connected to a chest tube atrium.  No air leak was noted.  Patient was extubated and taken to the recovery area.  He had a normal neurologic exam on emergence from anesthesia.  Upon completion of the case instrument and sharps counts were confirmed correct. The patient was transferred to the PACU in good condition. I was present for all portions of the procedure.  FOLLOW UP PLAN: Assuming a normal postoperative course, I will see the patient in 4 weeks with no studies.   Rande Brunt. Lenell Antu, MD [facility] Phone Number: [phone] 05/31/2023 12:50 PM

## 2023-05-31 NOTE — Anesthesia Postprocedure Evaluation (Signed)
Anesthesia Post Note  Patient: Gregory Jenkins  Procedure(s) Performed: LEFT FIRST RIB EXCISION (Left) LEFT UPPER EXTREMITY AND CENTRAL VENOGRAM (Chest)     Patient location during evaluation: PACU Anesthesia Type: General Level of consciousness: awake and alert Pain management: pain level controlled Vital Signs Assessment: post-procedure vital signs reviewed and stable Respiratory status: spontaneous breathing, nonlabored ventilation, respiratory function stable and patient connected to nasal cannula oxygen Cardiovascular status: blood pressure returned to baseline and stable Postop Assessment: no apparent nausea or vomiting Anesthetic complications: no   No notable events documented.  Last Vitals:  Vitals:   05/31/23 1115 05/31/23 1141  BP: 125/65 129/73  Pulse: (!) 53 60  Resp: 11 14  Temp: (!) 36.4 C 37.1 C  SpO2: 100% 100%    Last Pain:  Vitals:   05/31/23 1141  TempSrc: Oral  PainSc: 0-No pain                  

## 2023-05-31 NOTE — Anesthesia Preprocedure Evaluation (Signed)
Anesthesia Evaluation  Patient identified by MRN, date of birth, ID band Patient awake    Reviewed: Allergy & Precautions, NPO status , Patient's Chart, lab work & pertinent test results  History of Anesthesia Complications Negative for: history of anesthetic complications  Airway Mallampati: III  TM Distance: >3 FB Neck ROM: Full  Mouth opening: Limited Mouth Opening  Dental  (+) Teeth Intact, Dental Advisory Given   Pulmonary neg pulmonary ROS   breath sounds clear to auscultation       Cardiovascular negative cardio ROS  Rhythm:Regular     Neuro/Psych negative neurological ROS     GI/Hepatic negative GI ROS, Neg liver ROS,,,  Endo/Other  Hypothyroidism    Renal/GU negative Renal ROS     Musculoskeletal negative musculoskeletal ROS (+)    Abdominal   Peds  Hematology negative hematology ROS (+)   Anesthesia Other Findings S/p chemo/radiation for tonsillar ca  Reproductive/Obstetrics                             Anesthesia Physical Anesthesia Plan  ASA: 2  Anesthesia Plan: General   Post-op Pain Management: Ofirmev IV (intra-op)*   Induction: Intravenous  PONV Risk Score and Plan: 2 and Ondansetron and Dexamethasone  Airway Management Planned: Video Laryngoscope Planned and Oral ETT  Additional Equipment: ClearSight  Intra-op Plan:   Post-operative Plan: Extubation in OR  Informed Consent: I have reviewed the patients History and Physical, chart, labs and discussed the procedure including the risks, benefits and alternatives for the proposed anesthesia with the patient or authorized representative who has indicated his/her understanding and acceptance.     Dental advisory given  Plan Discussed with: CRNA  Anesthesia Plan Comments:        Anesthesia Quick Evaluation

## 2023-05-31 NOTE — Brief Op Note (Signed)
05/31/2023  11:08 AM  PATIENT:  Gregory Jenkins  72 y.o. 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  PROCEDURE:  Procedure(s): LEFT FIRST RIB EXCISION (Left) LEFT UPPER EXTREMITY AND CENTRAL VENOGRAM (N/A)  SURGEON:  Surgeons and Role:    * Leonie Douglas, MD - Primary    * Victorino Sparrow, MD - Assisting   ANESTHESIA:   general  EBL:  25 mL   BLOOD ADMINISTERED:none  DRAINS: (65F) Jackson-Pratt drain(s) with closed bulb suction in the pleural space    LOCAL MEDICATIONS USED:  NONE  SPECIMEN:  No Specimen  DISPOSITION OF SPECIMEN:  N/A  COUNTS:  YES  TOURNIQUET:  * No tourniquets in log *  DICTATION: .Dragon Dictation  PLAN OF CARE: Admit to inpatient   PATIENT DISPOSITION:  PACU - hemodynamically stable.   Delay start of Pharmacological VTE agent (>24hrs) due to surgical blood loss or risk of bleeding: no

## 2023-06-01 ENCOUNTER — Inpatient Hospital Stay (HOSPITAL_COMMUNITY): Payer: 59

## 2023-06-01 ENCOUNTER — Encounter (HOSPITAL_COMMUNITY): Payer: Self-pay | Admitting: Vascular Surgery

## 2023-06-01 LAB — CBC
HCT: 38.4 % — ABNORMAL LOW (ref 39.0–52.0)
Hemoglobin: 12.6 g/dL — ABNORMAL LOW (ref 13.0–17.0)
MCH: 31.3 pg (ref 26.0–34.0)
MCHC: 32.8 g/dL (ref 30.0–36.0)
MCV: 95.3 fL (ref 80.0–100.0)
Platelets: 197 10*3/uL (ref 150–400)
RBC: 4.03 MIL/uL — ABNORMAL LOW (ref 4.22–5.81)
RDW: 13.1 % (ref 11.5–15.5)
WBC: 9.7 10*3/uL (ref 4.0–10.5)
nRBC: 0 % (ref 0.0–0.2)

## 2023-06-01 MED ORDER — APIXABAN 5 MG PO TABS
5.0000 mg | ORAL_TABLET | Freq: Two times a day (BID) | ORAL | Status: DC
  Administered 2023-06-01: 5 mg via ORAL
  Filled 2023-06-01: qty 1

## 2023-06-01 MED ORDER — OXYCODONE HCL 5 MG PO TABS: 5.0000 mg | ORAL_TABLET | ORAL | 0 refills | Status: DC | PRN

## 2023-06-01 MED ORDER — LEVOTHYROXINE SODIUM 25 MCG PO TABS: 125.0000 ug | ORAL_TABLET | Freq: Every day | ORAL | Status: DC

## 2023-06-01 NOTE — Discharge Instructions (Signed)
   [facility] [place]  Discharge instructions  Rib Resection  Please refer to the following instruction for your post-procedure care. Your surgeon or physician assistant will discuss any changes with you.  Activity  You are encouraged to walk as much as you can. You can slowly return to normal activities during the month after your surgery. Avoid strenuous activity and heavy lifting for 3-4 weeks. Avoid activities such as vacuuming or swinging a golf club. You can drive once you are no longer taking pain medications. It is also normal to have difficulty with sleep habits, eating and bowel movement after surgery. These will go away with time.  Bathing/Showering  Shower daily after you go home. Do not soak in a bathtub, hot tub, or swim until the incision heals completely.  Incision Care  Clean your incision with mild soap and water. Shower every day. Pat the area dry with a clean towel. You do not need a bandage unless otherwise instructed. Do not apply any ointments or creams to your incision. You may have skin glue on your incision. Do not peel it off. It will come off on its own in about one week.  Diet  Resume your normal diet. There are no special food restrictions following this procedure. In order to heal from your surgery, it is CRITICAL to get adequate nutrition. Your body requires vitamins, minerals, and protein. Vegetables are the best source of vitamins and minerals. Vegetables also provide the perfect balance of protein. Processed food has little nutritional value, so try to avoid this.  Medications  Resume taking all your medications unless your doctor or physician assistant tells you not to. If your incision is causing pain, you may take over-the-counter pain relievers such as acetaminophen (Tylenol). If you were prescribed a stronger pain medication, please aware these medication can cause nausea and constipation. Prevent nausea by taking the  medication with a snack or meal. Avoid constipation by drinking plenty of fluids and eating foods with high amount of fiber, such as fruits, vegetables, and grains. Take Colace 100 mg (an over-the-counter stool softener) twice a day as needed for constipation.  Do not take Tylenol if you are taking prescription pain medications.  Follow Up  Our office will schedule a follow up appointment 2-3 weeks following discharge.  Please call us immediately for any of the following conditions  - Increased pain, redness, warmth, or drainage (pus) from your incision site(s) - Fever of 101 degree or higher  Reduce your risk of vascular disease  Stop smoking. If you would like help call [facility] at 1-800-QUIT-NOW [phone]) 389-0725) or [place] at [phone].  Manage your cholesterol Maintain a desired weight Control your diabetes weight Control your diabetes Keep your blood pressure down  If you have any questions, please call the office at [phone]

## 2023-06-01 NOTE — Progress Notes (Addendum)
Progress Note    06/01/2023 8:43 AM 1 Day Post-Op  Subjective:  feeling good this morning. No shortness of breath. Some soreness at incision site    Vitals:   06/01/23 0410 06/01/23 0807  BP: 139/66 104/61  Pulse: 78 68  Resp: 20 18  Temp: 98.7 F (37.1 C) 97.6 F (36.4 C)  SpO2: 93% 95%    Physical Exam: General:  resting comfortably. L sided pleural drain in place Cardiac:  regular Lungs:  nonlabored, O2 sats 96% on RA Incisions:  L sided infraclavicular incision intact and dry Extremities:  intact motor and sensation of LUE, palpable L radial pulse  CBC    Component Value Date/Time   WBC 9.1 05/31/2023 1145   RBC 4.34 05/31/2023 1145   HGB 13.5 05/31/2023 1145   HGB 13.9 05/26/2016 1111   HCT 41.1 05/31/2023 1145   HCT 40.6 05/26/2016 1111   PLT 176 05/31/2023 1145   PLT 149 05/26/2016 1111   MCV 94.7 05/31/2023 1145   MCV 96.4 05/26/2016 1111   MCH 31.1 05/31/2023 1145   MCHC 32.8 05/31/2023 1145   RDW 12.8 05/31/2023 1145   RDW 13.4 05/26/2016 1111   LYMPHSABS 0.5 (L) 04/07/2019 0859   LYMPHSABS 0.4 (L) 05/26/2016 1111   MONOABS 0.3 04/07/2019 0859   MONOABS 0.5 05/26/2016 1111   EOSABS 0.1 04/07/2019 0859   EOSABS 0.0 05/26/2016 1111   BASOSABS 0.0 04/07/2019 0859   BASOSABS 0.0 05/26/2016 1111    BMET    Component Value Date/Time   NA 137 05/31/2023 1145   NA 139 05/26/2016 1111   K 4.5 05/31/2023 1145   K 4.5 05/26/2016 1111   CL 103 05/31/2023 1145   CO2 27 05/31/2023 1145   CO2 28 05/26/2016 1111   GLUCOSE 137 (H) 05/31/2023 1145   GLUCOSE 97 05/26/2016 1111   BUN 11 05/31/2023 1145   BUN 18.8 10/01/2016 0956   CREATININE 1.07 05/31/2023 1145   CREATININE 1.0 10/01/2016 0956   CALCIUM 8.9 05/31/2023 1145   CALCIUM 9.9 05/26/2016 1111   GFRNONAA >60 05/31/2023 1145   GFRAA >60 04/07/2019 0859    INR    Component Value Date/Time   INR 0.9 05/31/2023 1145     Intake/Output Summary (Last 24 hours) at 06/01/2023 0843 Last data  filed at 05/31/2023 2034 Gross per 24 hour  Intake 1100 ml  Output 400 ml  Net 700 ml      Assessment/Plan:  72 y.o. male is 1 day post op, s/p: left 1st rib excision and left upper extremity venogram   -Stable overnight and pain is well controlled. Some soreness at incision site  -L infraclavicular incision dry and intact with clean bandage.  No evidence of hematoma -Left upper extremity warm and well-perfused with intact motor and sensation.  He has a palpable left radial pulse -Left-sided pleural drain in place overnight.  No evidence of air leak in chest tube atrium.  Only 10 cc output overnight.  -Tolerating a normal diet and bowels are active -Will plan for CXR this morning, followed by pleural drain removal.  Will repeat CXR 2 hours after drain removal -Potential discharge home later today pending chest x-ray results and pain control.  Okay to restart Eliquis today   Loel Dubonnet, [place] [facility] [phone] 06/01/2023 8:43 AM   VASCULAR STAFF ADDENDUM: I have independently interviewed and examined the patient. I agree with the above.  Looks good POD#1 left first rib excision. CXR clear before  and after pleural drain removal. Offered patient discharge or observation overnight. He will decide and get back to [place].   Rande Brunt. Lenell Antu, MD [facility] Phone Number: [phone] 06/01/2023 4:23 PM

## 2023-06-01 NOTE — Evaluation (Signed)
Physical Therapy Evaluation Patient Details Name: Gregory Jenkins MRN: [id number] DOB: January 07, 1951 Today's Date: 06/01/2023  History of Present Illness  Gregory Jenkins is a 72 y.o. male who presents to ED with acute DVT involving L subclavian, brachial and axillary veins. Pt underwent L first rib resection for thoracic outlet syndrome on 8/5. 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 Pt mobilizing well with minimal L UE/chest pain from surgery. Pt and spouse educated on L UE precautions to maintain L UE <90 deg and no reaching behind self. Pt limited to lifting no more than a coffee cup. Pt able to ambulate without AD and negotiate stairs necessary to enter home. Pt with supportive spouse and good home set up. Acute PT to cont to monitor pt while in hospital. Spoke with pt, spouse, and MD about reassessing L UE and shld ROM/function at follow up appt to determine if patient would benefit from outpt PT to his L UE.        If plan is discharge home, recommend the following:     Can travel by private vehicle        Equipment Recommendations None recommended by PT  Recommendations for Other Services       Functional Status Assessment       Precautions / Restrictions Precautions Precautions: Other (comment) Precaution Comments: Per Dr. Lenell Antu pt to lift no more than a coffee cup, keep L shld flex/abduction below 90deg, no reaching behind with L UE and to complete gentle ROM to L shld <90 deg Restrictions Weight Bearing Restrictions: No Other Position/Activity Restrictions: L shld flex/abd <90 deg      Mobility  Bed Mobility Overal bed mobility: Needs Assistance Bed Mobility: Supine to Sit     Supine to sit: Supervision     General bed mobility comments: pt with HOB elevated, states he has an adjustable bed at home. Pt brought self forward and was able to bring  LEs off EOB, educated on keeping L UE in lap to not use it to push/pull when getting up    Transfers Overall transfer level: Needs assistance Equipment used: Rolling walker (2 wheels), None Transfers: Sit to/from Stand Sit to Stand: Min guard           General transfer comment: min guard for safety, verbal cues to minimize pushing up with L hand    Ambulation/Gait Ambulation/Gait assistance: Min guard Gait Distance (Feet): 200 Feet Assistive device: None Gait Pattern/deviations: WFL(Within Functional Limits) Gait velocity: wfl Gait velocity interpretation: >4.37 ft/sec, indicative of normal walking speed   General Gait Details: no episodes of LOB, steady, denies L UE shld/chest pain with L UE in dependent position  Stairs Stairs: Yes Stairs assistance: Min guard Stair Management: One rail Right, Alternating pattern, Forwards Number of Stairs: 12 General stair comments: no LOB, since pt has hand rails on both sides at home pt instructed only use R hand rail so he wasnt to pull at all with L UE  Wheelchair Mobility     Tilt Bed    Modified Rankin (Stroke Patients Only)       Balance Overall balance assessment: Mild deficits observed, not formally tested  Pertinent Vitals/Pain Pain Assessment Pain Assessment: 0-10 Pain Score: 2  Pain Location: L chest at incision site Pain Descriptors / Indicators: Sore Pain Intervention(s): Monitored during session    Home Living Family/patient expects to be discharged to:: Private residence Living Arrangements: Spouse/significant other Available Help at Discharge: Family Type of Home: House Home Access: Stairs to enter Entrance Stairs-Rails: Can reach both Entrance Stairs-Number of Steps: 5 Alternate Level Stairs-Number of Steps: flight Home Layout: Multi-level Home Equipment: None      Prior Function Prior Level of Function : Independent/Modified  Independent             Mobility Comments: indep, no AD, works outside all the time ADLs Comments: indep     Hand Dominance   Dominant Hand: Right    Extremity/Trunk Assessment   Upper Extremity Assessment Upper Extremity Assessment: LUE deficits/detail LUE Deficits / Details: shld ROM restrictions <90 deg due to surgical precautions, noted edema t/o arm, able to bend elbow and make a fist    Lower Extremity Assessment Lower Extremity Assessment: Overall WFL for tasks assessed    Cervical / Trunk Assessment Cervical / Trunk Assessment: Other exceptions Cervical / Trunk Exceptions: L chest incision  Communication   Communication: HOH  Cognition Arousal/Alertness: Awake/alert Behavior During Therapy: WFL for tasks assessed/performed Overall Cognitive Status: Within Functional Limits for tasks assessed                                          General Comments General comments (skin integrity, edema, etc.): L UE edema, VSS, chest incision dressing intact    Exercises Other Exercises Other Exercises: gentle ROM to L UE, shld flexion up 90 deg, ABD with elbow bent up to 90 deg, elbow flexion and open/closing hand   Assessment/Plan    PT Assessment Patient needs continued PT services  PT Problem List Decreased strength;Decreased range of motion;Decreased activity tolerance;Decreased balance;Decreased mobility       PT Treatment Interventions Gait training;Stair training;Functional mobility training;Therapeutic activities;Therapeutic exercise    PT Goals (Current goals can be found in the Care Plan section)  Acute Rehab PT Goals Patient Stated Goal: home PT Goal Formulation: With patient/family Time For Goal Achievement: 06/15/23 Potential to Achieve Goals: Good    Frequency Min 1X/week     Co-evaluation               AM-PAC PT "6 Clicks" Mobility  Outcome Measure Help needed turning from your back to your side while in a flat bed  without using bedrails?: None Help needed moving from lying on your back to sitting on the side of a flat bed without using bedrails?: None Help needed moving to and from a bed to a chair (including a wheelchair)?: None Help needed standing up from a chair using your arms (e.g., wheelchair or bedside chair)?: None Help needed to walk in hospital room?: A Little Help needed climbing 3-5 steps with a railing? : A Little 6 Click Score: 22    End of Session   Activity Tolerance: Patient tolerated treatment well Patient left: in chair;with call bell/phone within reach;with family/visitor present Nurse Communication: Mobility status PT Visit Diagnosis: Unsteadiness on feet (R26.81)    Time: 1610-9604 PT Time Calculation (min) (ACUTE ONLY): 30 min   Charges:   PT Evaluation $PT Eval Moderate Complexity: 1 Mod PT Treatments $Gait Training: 8-22 mins PT General Charges $$  ACUTE PT VISIT: 1 Visit         Lewis Shock, PT, DPT Acute Rehabilitation Services Secure chat preferred Office #: [phone]   Iona Hansen 06/01/2023, 2:11 PM

## 2023-06-01 NOTE — Evaluation (Signed)
Occupational Therapy Evaluation Patient Details Name: Gregory Jenkins MRN: [id number] DOB: [birth year] Today's Date: 06/01/2023   History of Present Illness Gregory Jenkins is a 72 y.o. male who presents to ED with acute DVT involving L subclavian, brachial and axillary veins. Pt underwent L first rib resection for thoracic outlet syndrome on 8/5. 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  Patient is normally independent in ADLs and takes care of all outside work such as carrying for their horse and donkey.  Patient requires min A for UB dressing and LB dressing with verbal cues to adhere to L UE precautions listed below.  Patient completed functional mobility with min guard for overall safety.     Recommendations for follow up therapy are one component of a multi-disciplinary discharge planning process, led by the attending physician.  Recommendations may be updated based on patient status, additional functional criteria and insurance authorization.   Assistance Recommended at Discharge Intermittent Supervision/Assistance  Patient can return home with the following Assist for transportation;Assistance with cooking/housework    Functional Status Assessment  Patient has had a recent decline in their functional status and demonstrates the ability to make significant improvements in function in a reasonable and predictable amount of time.  Equipment Recommendations  None recommended by OT    Recommendations for Other Services       Precautions / Restrictions Precautions Precautions: Other (comment) Precaution Comments: Per Dr. Lenell Antu pt to lift no more than a coffee cup, keep L shld flex/abduction below 90deg, no reaching behind with L UE and to complete gentle ROM to L shld <90  deg Restrictions Weight Bearing Restrictions: No Other Position/Activity Restrictions: L shld flex/abd <90 deg      Mobility Bed Mobility Overal bed mobility: Needs Assistance Bed Mobility:  (patient sitting in bedside chair upon arrival into room)                Transfers Overall transfer level: Needs assistance   Transfers: Sit to/from Stand Sit to Stand: Min guard           General transfer comment: min guard for safety, verbal cues to minimize pushing up with L hand      Balance                                           ADL either performed or assessed with clinical judgement   ADL Overall ADL's : Needs assistance/impaired Eating/Feeding: Independent   Grooming: Wash/dry face;Wash/dry hands;Cueing for UE precautions;Standing   Upper Body Bathing: Supervision/ safety;Sitting   Lower Body Bathing: Sit to/from stand;Min guard   Upper Body Dressing : Minimal assistance;Sitting (to thread L UE into garment first to adhere to precautions with LUE)   Lower Body Dressing: Minimal assistance;Sit to/from stand (verbal cues to not push through L UE 2/2 precautions)   Toilet Transfer: Min guard   Toileting- Clothing Manipulation and Hygiene: Minimal assistance (cues to not reach back with LUE to adjust L waist band)       Functional mobility during ADLs: Min guard       Vision         Perception     Praxis  Pertinent Vitals/Pain Pain Assessment Pain Assessment: No/denies pain Pain Score: 0-No pain Pain Intervention(s): Monitored during session     Hand Dominance Right   Extremity/Trunk Assessment Upper Extremity Assessment Upper Extremity Assessment: LUE deficits/detail (2/2 stated parameters above) LUE Deficits / Details: shld ROM restrictions <90 deg due to surgical precautions, noted edema t/o arm, able to bend elbow and make a fist LUE Sensation: WNL LUE Coordination: WNL   Lower Extremity Assessment Lower  Extremity Assessment: Defer to PT evaluation   Cervical / Trunk Assessment Cervical / Trunk Assessment: Normal Cervical / Trunk Exceptions: L chest incision   Communication Communication Communication: HOH   Cognition Arousal/Alertness: Awake/alert Behavior During Therapy: WFL for tasks assessed/performed Overall Cognitive Status: Within Functional Limits for tasks assessed                                       General Comments  L UE edema, VSS, chest incision dressing intact    Exercises     Shoulder Instructions      Home Living Family/patient expects to be discharged to:: Private residence Living Arrangements: Spouse/significant other Available Help at Discharge: Family Type of Home: House Home Access: Stairs to enter Secretary/administrator of Steps: 5 Entrance Stairs-Rails: Can reach both Home Layout: Multi-level Alternate Level Stairs-Number of Steps: flight Alternate Level Stairs-Rails: Can reach both Bathroom Shower/Tub: Chief Strategy Officer: Handicapped height     Home Equipment: None          Prior Functioning/Environment Prior Level of Function : Independent/Modified Independent             Mobility Comments: indep, no AD, works outside all the time ADLs Comments: indep        OT Problem List: Decreased strength;Decreased range of motion;Decreased activity tolerance;Decreased safety awareness;Decreased knowledge of precautions      OT Treatment/Interventions:      OT Goals(Current goals can be found in the care plan section) Acute Rehab OT Goals OT Goal Formulation: With patient Time For Goal Achievement: 06/15/23 Potential to Achieve Goals: Good  OT Frequency: Min 1X/week    Co-evaluation              AM-PAC OT "6 Clicks" Daily Activity     Outcome Measure Help from another person eating meals?: None Help from another person taking care of personal grooming?: None Help from another person toileting,  which includes using toliet, bedpan, or urinal?: A Little Help from another person bathing (including washing, rinsing, drying)?: A Little Help from another person to put on and taking off regular upper body clothing?: A Little Help from another person to put on and taking off regular lower body clothing?: A Little 6 Click Score: 20   End of Session Nurse Communication: Mobility status  Activity Tolerance: Patient tolerated treatment well Patient left: in chair;with call bell/phone within reach;with family/visitor present  OT Visit Diagnosis: Muscle weakness (generalized) (M62.81);Unsteadiness on feet (R26.81)                Time: 1610-9604 OT Time Calculation (min): 20 min Charges:  OT General Charges $OT Visit: 1 Visit OT Evaluation $OT Eval Low Complexity: 1 Low  Governor Specking OT/L  REILLY COTTINGHAM 06/01/2023, 2:45 PM

## 2023-06-14 NOTE — Discharge Summary (Signed)
[facility] Discharge Summary   Patient ID:  Gregory Jenkins MRN: [id number] DOB/AGE: 11/09/1950 72 y.o.  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]  Secondary Diagnoses: Past Medical History:  Diagnosis Date   Abdominal pain 03/19/2016   Allergic rhinitis    Cancer of tonsillar fossa (HCC)    left   Clotting disorder (HCC)    PE, suspect lupus anticoagulant   Drug-induced skin rash 12/25/2015   History of kidney stones 2000   History of nephrolithiasis    History of pulmonary embolism    History of radiation therapy 12/17/2015- 02/03/16   Left Tonsil and Bilateral Neck   Hypothyroidism    Mucositis oral 01/08/2016   Sleep apnea 2000   Thyroid disease    Tonsil cancer (HCC)     Procedures: 05/31/2023 Procedure(s): LEFT FIRST RIB EXCISION LEFT UPPER EXTREMITY AND CENTRAL VENOGRAM  Discharged Condition: good  HPI:  Hospital Course:  [name] is a 72 y.o. male with left venous thoracic outlet syndrome. He underwent left first rib excision which he tolerated well. Postoperative CXR showed no pneumothorax or hemothorax. Pain control was reasonable. He was discharged home POD#1.  Significant Diagnostic Studies: CBC    Component Value Date/Time   WBC 9.7 06/01/2023 0833   RBC 4.03 (L) 06/01/2023 0833   HGB 12.6 (L) 06/01/2023 0833   HGB 13.9 05/26/2016 1111   HCT 38.4 (L) 06/01/2023 0833   HCT 40.6 05/26/2016 1111   PLT 197 06/01/2023 0833   PLT 149 05/26/2016 1111   MCV 95.3 06/01/2023 0833   MCV 96.4 05/26/2016 1111   MCH 31.3 06/01/2023 0833   MCHC 32.8 06/01/2023 0833   RDW 13.1 06/01/2023 0833   RDW 13.4 05/26/2016 1111   LYMPHSABS 0.5 (L) 04/07/2019 0859   LYMPHSABS  0.4 (L) 05/26/2016 1111   MONOABS 0.3 04/07/2019 0859   MONOABS 0.5 05/26/2016 1111   EOSABS 0.1 04/07/2019 0859   EOSABS 0.0 05/26/2016 1111   BASOSABS 0.0 04/07/2019 0859   BASOSABS 0.0 05/26/2016 1111    BMET    Component Value Date/Time   NA 137 05/31/2023 1145   NA 139 05/26/2016 1111   K 4.5 05/31/2023 1145   K 4.5 05/26/2016 1111   CL 103 05/31/2023 1145   CO2 27 05/31/2023 1145   CO2 28 05/26/2016 1111   GLUCOSE 137 (H) 05/31/2023 1145   GLUCOSE 97 05/26/2016 1111   BUN 11 05/31/2023 1145   BUN 18.8 10/01/2016 0956   CREATININE 1.07 05/31/2023 1145   CREATININE 1.0 10/01/2016 0956   CALCIUM 8.9 05/31/2023 1145   CALCIUM 9.9 05/26/2016 1111   GFRNONAA >60 05/31/2023 1145   GFRAA >60 04/07/2019 0859    COAG estimated creatinine clearance is 68.1 mL/min (by C-G formula based on SCr of 1.07 mg/dL).  No results found for: "PTT"  Disposition:  Discharge to :Home Discharge Instructions     Call MD for:  redness, tenderness, or signs of infection (pain, swelling, redness, odor or green/yellow discharge around incision site)   Complete by: As directed    Call MD for:  severe uncontrolled pain   Complete by: As directed    Call MD for:  temperature >100.4   Complete by: As directed    Diet - low sodium heart  healthy   Complete by: As directed    Increase activity slowly   Complete by: As directed       Verbal and written Discharge instructions given to the patient. Wound care per Discharge AVS  Rande Brunt. Lenell Antu, MD [facility] Phone Number: [phone] 06/14/2023 8:42 AM

## 2023-06-20 ENCOUNTER — Other Ambulatory Visit: Payer: Self-pay

## 2023-06-20 ENCOUNTER — Emergency Department (HOSPITAL_COMMUNITY): Payer: 59

## 2023-06-20 ENCOUNTER — Observation Stay (HOSPITAL_COMMUNITY)
Admission: EM | Admit: 2023-06-20 | Discharge: 2023-06-23 | Disposition: A | Discharge status: Home / Self Care | Payer: 59 | Attending: Internal Medicine | Admitting: Internal Medicine

## 2023-06-20 ENCOUNTER — Encounter (HOSPITAL_COMMUNITY): Payer: Self-pay

## 2023-06-20 DIAGNOSIS — Z79899 Other long term (current) drug therapy: Secondary | ICD-10-CM | POA: Diagnosis not present

## 2023-06-20 DIAGNOSIS — Z86711 Personal history of pulmonary embolism: Secondary | ICD-10-CM | POA: Insufficient documentation

## 2023-06-20 DIAGNOSIS — E039 Hypothyroidism, unspecified: Secondary | ICD-10-CM

## 2023-06-20 DIAGNOSIS — Z86718 Personal history of other venous thrombosis and embolism: Secondary | ICD-10-CM | POA: Diagnosis not present

## 2023-06-20 DIAGNOSIS — K810 Acute cholecystitis: Secondary | ICD-10-CM

## 2023-06-20 DIAGNOSIS — R109 Unspecified abdominal pain: Secondary | ICD-10-CM | POA: Diagnosis present

## 2023-06-20 DIAGNOSIS — Z7901 Long term (current) use of anticoagulants: Secondary | ICD-10-CM | POA: Insufficient documentation

## 2023-06-20 DIAGNOSIS — Z7982 Long term (current) use of aspirin: Secondary | ICD-10-CM | POA: Insufficient documentation

## 2023-06-20 DIAGNOSIS — Z87891 Personal history of nicotine dependence: Secondary | ICD-10-CM | POA: Insufficient documentation

## 2023-06-20 DIAGNOSIS — Z85818 Personal history of malignant neoplasm of other sites of lip, oral cavity, and pharynx: Secondary | ICD-10-CM | POA: Diagnosis not present

## 2023-06-20 DIAGNOSIS — I82622 Acute embolism and thrombosis of deep veins of left upper extremity: Secondary | ICD-10-CM

## 2023-06-20 DIAGNOSIS — K819 Cholecystitis, unspecified: Principal | ICD-10-CM

## 2023-06-20 DIAGNOSIS — C090 Malignant neoplasm of tonsillar fossa: Secondary | ICD-10-CM | POA: Diagnosis present

## 2023-06-20 DIAGNOSIS — K8012 Calculus of gallbladder with acute and chronic cholecystitis without obstruction: Secondary | ICD-10-CM | POA: Diagnosis not present

## 2023-06-20 DIAGNOSIS — I871 Compression of vein: Secondary | ICD-10-CM | POA: Diagnosis present

## 2023-06-20 LAB — COMPREHENSIVE METABOLIC PANEL
ALT: 14 U/L (ref 0–44)
AST: 17 U/L (ref 15–41)
Albumin: 3.3 g/dL — ABNORMAL LOW (ref 3.5–5.0)
Alkaline Phosphatase: 82 U/L (ref 38–126)
Anion gap: 8 (ref 5–15)
BUN: 15 mg/dL (ref 8–23)
CO2: 24 mmol/L (ref 22–32)
Calcium: 8.9 mg/dL (ref 8.9–10.3)
Chloride: 103 mmol/L (ref 98–111)
Creatinine, Ser: 0.98 mg/dL (ref 0.61–1.24)
GFR, Estimated: >60 mL/min (ref >60)
Glucose, Bld: 143 mg/dL — ABNORMAL HIGH (ref 70–99)
Potassium: 4 mmol/L (ref 3.5–5.1)
Sodium: 135 mmol/L (ref 135–145)
Total Bilirubin: 0.5 mg/dL (ref 0.3–1.2)
Total Protein: 7.1 g/dL (ref 6.5–8.1)

## 2023-06-20 LAB — URINALYSIS, ROUTINE W REFLEX MICROSCOPIC
Bilirubin Urine: NEGATIVE
Glucose, UA: NEGATIVE mg/dL
Hgb urine dipstick: NEGATIVE
Ketones, ur: NEGATIVE mg/dL
Leukocytes,Ua: NEGATIVE
Nitrite: NEGATIVE
Protein, ur: NEGATIVE mg/dL
Specific Gravity, Urine: 1.019 (ref 1.005–1.030)
pH: 5 (ref 5.0–8.0)

## 2023-06-20 LAB — TYPE AND SCREEN
ABO/RH(D): B POS
Antibody Screen: NEGATIVE

## 2023-06-20 LAB — CBC
HCT: 38.4 % — ABNORMAL LOW (ref 39.0–52.0)
Hemoglobin: 12.3 g/dL — ABNORMAL LOW (ref 13.0–17.0)
MCH: 30.9 pg (ref 26.0–34.0)
MCHC: 32 g/dL (ref 30.0–36.0)
MCV: 96.5 fL (ref 80.0–100.0)
Platelets: 318 10*3/uL (ref 150–400)
RBC: 3.98 MIL/uL — ABNORMAL LOW (ref 4.22–5.81)
RDW: 12.2 % (ref 11.5–15.5)
WBC: 7.4 10*3/uL (ref 4.0–10.5)
nRBC: 0 % (ref 0.0–0.2)

## 2023-06-20 LAB — LIPASE, BLOOD: Lipase: 34 U/L (ref 11–51)

## 2023-06-20 LAB — TSH: TSH: 2.265 u[IU]/mL (ref 0.350–4.500)

## 2023-06-20 MED ORDER — SODIUM CHLORIDE 0.9 % IV BOLUS: 1000.0000 mL | Freq: Once | INTRAVENOUS | Status: DC

## 2023-06-20 MED ORDER — ASPIRIN 81 MG PO TBEC
81.0000 mg | DELAYED_RELEASE_TABLET | Freq: Every day | ORAL | Status: DC
  Administered 2023-06-20 – 2023-06-23 (×3): 81 mg via ORAL
  Filled 2023-06-20 (×3): qty 1

## 2023-06-20 MED ORDER — SODIUM CHLORIDE 0.9 % IV SOLN: INTRAVENOUS | Status: DC

## 2023-06-20 MED ORDER — MORPHINE SULFATE (PF) 2 MG/ML IV SOLN: 2.0000 mg | INTRAVENOUS | Status: DC | PRN

## 2023-06-20 MED ORDER — SODIUM CHLORIDE 0.9 % IV SOLN
2.0000 g | Freq: Once | INTRAVENOUS | Status: AC
  Administered 2023-06-20: 2 g via INTRAVENOUS
  Filled 2023-06-20: qty 20

## 2023-06-20 MED ORDER — ACETAMINOPHEN 325 MG PO TABS: 650.0000 mg | ORAL_TABLET | Freq: Four times a day (QID) | ORAL | Status: DC | PRN

## 2023-06-20 MED ORDER — IOHEXOL 350 MG/ML SOLN
75.0000 mL | Freq: Once | INTRAVENOUS | Status: AC | PRN
  Administered 2023-06-20: 75 mL via INTRAVENOUS

## 2023-06-20 MED ORDER — PIPERACILLIN-TAZOBACTAM 3.375 G IVPB
3.3750 g | Freq: Three times a day (TID) | INTRAVENOUS | Status: DC
  Administered 2023-06-20 – 2023-06-22 (×6): 3.375 g via INTRAVENOUS
  Filled 2023-06-20 (×6): qty 50

## 2023-06-20 MED ORDER — HYDROMORPHONE HCL 1 MG/ML IJ SOLN
1.0000 mg | Freq: Once | INTRAMUSCULAR | Status: AC
  Administered 2023-06-20: 1 mg via INTRAVENOUS
  Filled 2023-06-20: qty 1

## 2023-06-20 MED ORDER — ONDANSETRON HCL 4 MG/2ML IJ SOLN
4.0000 mg | Freq: Four times a day (QID) | INTRAMUSCULAR | Status: DC | PRN
  Administered 2023-06-20: 4 mg via INTRAVENOUS
  Filled 2023-06-20: qty 2

## 2023-06-20 MED ORDER — ONDANSETRON HCL 4 MG/2ML IJ SOLN
4.0000 mg | Freq: Once | INTRAMUSCULAR | Status: AC
  Administered 2023-06-20: 4 mg via INTRAVENOUS
  Filled 2023-06-20: qty 2

## 2023-06-20 MED ORDER — LEVOTHYROXINE SODIUM 25 MCG PO TABS
125.0000 ug | ORAL_TABLET | Freq: Every day | ORAL | Status: DC
  Administered 2023-06-20 – 2023-06-23 (×3): 125 ug via ORAL
  Filled 2023-06-20 (×3): qty 1

## 2023-06-20 MED ORDER — ACETAMINOPHEN 650 MG RE SUPP: 650.0000 mg | Freq: Four times a day (QID) | RECTAL | Status: DC | PRN

## 2023-06-20 MED ORDER — ATORVASTATIN CALCIUM 10 MG PO TABS
10.0000 mg | ORAL_TABLET | Freq: Every day | ORAL | Status: DC
  Administered 2023-06-20 – 2023-06-23 (×3): 10 mg via ORAL
  Filled 2023-06-20 (×3): qty 1

## 2023-06-20 NOTE — Assessment & Plan Note (Signed)
Check TSH ?Continue home synthroid  ?

## 2023-06-20 NOTE — ED Triage Notes (Signed)
Pt c/o upper abdominal pain radiating into mid back and nausea starting this morning.  Pain score 8/10.

## 2023-06-20 NOTE — Assessment & Plan Note (Signed)
72 year old male presenting with acute on set of abdominal pain early this AM that radiated to his back with associated nausea found to have cholelithiasis with mild gallbladder wall thickening and positive murphy sign concerning for acute cholecystitis.  -admit to med-surg -no leukocytosis, transaminitis or SIRS/sepsis criteria -general surgery consulted -NPO at midnight -last took eliquis at 5pm on 8/24. Hold eliquis. General surgery discussed with vascular, no heparin for now. Will discuss again in AM  -zosyn  -IVF  -anti-emetics -pain control as needed, currently pain free

## 2023-06-20 NOTE — ED Notes (Signed)
ED TO INPATIENT HANDOFF REPORT  ED Nurse Name and Phone #: Lenell Antu Name/Age/Gender Gregory Jenkins 72 y.o. male Room/Bed: H011C/H011C  Code Status   Code Status: Prior  Home/SNF/Other Home Patient oriented to: self, place, time, and situation Is this baseline? Yes   Triage Complete: Triage complete  Chief Complaint Acute cholecystitis [K81.0]  Triage Note Pt c/o upper abdominal pain radiating into mid back and nausea starting this morning.  Pain score 8/10.       Allergies No Known Allergies  Level of Care/Admitting Diagnosis ED Disposition     ED Disposition  Admit   Condition  --   Comment  Hospital Area: [facility] [[id number]]  Level of Care: Med-Surg [16]  May admit patient to [facility] or [facility] if equivalent level of care is available:: No  Covid Evaluation: Asymptomatic - no recent exposure (last 10 days) testing not required  Diagnosis: Acute cholecystitis [575.0.ICD-9-CM]  Admitting Physician: Orland Mustard [[id number]]  Attending Physician: Orland Mustard [phone]  Certification:: I certify this patient will need inpatient services for at least 2 midnights  Expected Medical Readiness: 06/22/2023          B Medical/Surgery History Past Medical History:  Diagnosis Date   Abdominal pain 03/19/2016   Allergic rhinitis    Cancer of tonsillar fossa (HCC)    left   Clotting disorder (HCC)    PE, suspect lupus anticoagulant   Drug-induced skin rash 12/25/2015   History of kidney stones 2000   History of nephrolithiasis    History of pulmonary embolism    History of radiation therapy 12/17/2015- 02/03/16   Left Tonsil and Bilateral Neck   Hypothyroidism    Mucositis oral 01/08/2016   Sleep apnea 2000   Thyroid disease    Tonsil cancer [facility])    Past Surgical History:  Procedure Laterality Date   AMPUTATION Left 01/01/2018   Procedure: REVISION AMPUTATION RING FINGER;  Surgeon: Bradly Bienenstock, MD;  Location: [facility] OR;  Service:  Orthopedics;  Laterality: Left;   CARPAL TUNNEL RELEASE Left 11/14/2021   Procedure: LEFT CARPAL TUNNEL RELEASE;  Surgeon: Betha Loa, MD;  Location: [facility];  Service: Orthopedics;  Laterality: Left;  30 MIN   EYE SURGERY Right 1980's   H/O right orbital "blowout" fracture   FOOT SURGERY Left    [facility] Neuroma   GASTROSTOMY TUBE PLACEMENT     IR GASTROSTOMY TUBE REMOVAL     IR GENERIC HISTORICAL  10/12/2016   IR REMOVAL TUN ACCESS W/ PORT W/O FL MOD SED 10/12/2016 Gilmer Mor, DO [facility]-INTERV RAD   KNEE SURGERY Right    Arthroscopic   PORTA CATH INSERTION     RIB RESECTION Left 05/31/2023   Procedure: LEFT FIRST RIB EXCISION;  Surgeon: Leonie Douglas, MD;  Location: [facility] OR;  Service: Vascular;  Laterality: Left;   VENOGRAM N/A 05/31/2023   Procedure: LEFT UPPER EXTREMITY AND CENTRAL VENOGRAM;  Surgeon: Leonie Douglas, MD;  Location: [facility] OR;  Service: Vascular;  Laterality: N/A;     A IV Location/Drains/Wounds Patient Lines/Drains/Airways Status     Active Line/Drains/Airways     Name Placement date Placement time Site Days   Peripheral IV 06/20/23 20 G Right Antecubital 06/20/23  0802  Antecubital  less than 1            Intake/Output Last 24 hours No intake or output data in the 24 hours ending 06/20/23 1406  Labs/Imaging Results for orders placed  or performed during the hospital encounter of 06/20/23 (from the past 48 hour(s))  Urinalysis, Routine w reflex microscopic -Urine, Clean Catch     Status: None   Collection Time: 06/20/23  7:23 AM  Result Value Ref Range   Color, Urine YELLOW YELLOW   APPearance CLEAR CLEAR   Specific Gravity, Urine 1.019 1.005 - 1.030   pH 5.0 5.0 - 8.0   Glucose, UA NEGATIVE NEGATIVE mg/dL   Hgb urine dipstick NEGATIVE NEGATIVE   Bilirubin Urine NEGATIVE NEGATIVE   Ketones, ur NEGATIVE NEGATIVE mg/dL   Protein, ur NEGATIVE NEGATIVE mg/dL   Nitrite NEGATIVE NEGATIVE   Leukocytes,Ua NEGATIVE NEGATIVE    Comment:  Performed at [facility], [address], [place], [place] [address]  Lipase, blood     Status: None   Collection Time: 06/20/23  7:28 AM  Result Value Ref Range   Lipase 34 11 - 51 U/L    Comment: Performed at [facility], [address] [place], [place] [address]  Comprehensive metabolic panel     Status: Abnormal   Collection Time: 06/20/23  7:28 AM  Result Value Ref Range   Sodium 135 135 - 145 mmol/L   Potassium 4.0 3.5 - 5.1 mmol/L   Chloride 103 98 - 111 mmol/L   CO2 24 22 - 32 mmol/L   Glucose, Bld 143 (H) 70 - 99 mg/dL    Comment: Glucose reference range applies only to samples taken after fasting for at least 8 hours.   BUN 15 8 - 23 mg/dL   Creatinine, Ser 0.98 0.61 - 1.24 mg/dL   Calcium 8.9 8.9 - 11.9 mg/dL   Total Protein 7.1 6.5 - 8.1 g/dL   Albumin 3.3 (L) 3.5 - 5.0 g/dL   AST 17 15 - 41 U/L   ALT 14 0 - 44 U/L   Alkaline Phosphatase 82 38 - 126 U/L   Total Bilirubin 0.5 0.3 - 1.2 mg/dL   GFR, Estimated >14 >78 mL/min    Comment: (NOTE) Calculated using the CKD-EPI Creatinine Equation (2021)    Anion gap 8 5 - 15    Comment: Performed at [facility], [address] [place], [place] [address]  CBC     Status: Abnormal   Collection Time: 06/20/23  7:28 AM  Result Value Ref Range   WBC 7.4 4.0 - 10.5 K/uL   RBC 3.98 (L) 4.22 - 5.81 MIL/uL   Hemoglobin 12.3 (L) 13.0 - 17.0 g/dL   HCT 13.0 (L) 86.5 - 78.4 %   MCV 96.5 80.0 - 100.0 fL   MCH 30.9 26.0 - 34.0 pg   MCHC 32.0 30.0 - 36.0 g/dL   RDW 69.6 29.5 - 28.4 %   Platelets 318 150 - 400 K/uL   nRBC 0.0 0.0 - 0.2 %    Comment: Performed at [facility], [address] [place], [place] [address]   US Abdomen Limited RUQ (LIVER/GB)  Result Date: 06/20/2023 CLINICAL DATA:  Right upper quadrant abdominal pain. EXAM: ULTRASOUND ABDOMEN LIMITED RIGHT UPPER QUADRANT COMPARISON:  CT of the abdomen and pelvis with contrast 06/20/2023 FINDINGS: Gallbladder: Multiple small stones are again  noted at the neck of the gallbladder. Gallbladder is now somewhat contracted. This likely exaggerates wall thickening. The wall measures 4 mm. A sonographic Eulah Pont sign is reported. Common bile duct: Diameter: 3 mm, within normal limits Liver: The liver is mildly echogenic. No discrete lesions are present. Portal vein is  patent on color Doppler imaging with normal direction of blood flow towards the liver. Other: None. IMPRESSION: 1. Cholelithiasis with mild gallbladder wall thickening and a sonographic Murphy sign. Findings are concerning for acute cholecystitis. 2. Mild hepatic steatosis. Electronically Signed   By: Marin Roberts M.D.   On: 06/20/2023 11:12   CT ABDOMEN PELVIS W CONTRAST  Result Date: 06/20/2023 CLINICAL DATA:  72 year old male with history of acute onset of nonlocalized abdominal pain. EXAM: CT ABDOMEN AND PELVIS WITH CONTRAST TECHNIQUE: Multidetector CT imaging of the abdomen and pelvis was performed using the standard protocol following bolus administration of intravenous contrast. RADIATION DOSE REDUCTION: This exam was performed according to the departmental dose-optimization program which includes automated exposure control, adjustment of the mA and/or kV according to patient size and/or use of iterative reconstruction technique. CONTRAST:  75mL OMNIPAQUE IOHEXOL 350 MG/ML SOLN COMPARISON:  CT of the abdomen and pelvis 03/20/2016. FINDINGS: Lower chest: Small left pleural effusion with some passive subsegmental atelectasis in the left lower lobe. Hepatobiliary: No suspicious cystic or solid hepatic lesions. No intra or extrahepatic biliary ductal dilatation. There is a waist like narrowing of the mid gallbladder where there is some enhancing soft tissue, best appreciated on coronal image 53 of series 6. Beyond this region there are numerous calcified and noncalcified gallstones filling the lumen of the gallbladder to the level of the fundus. Gallbladder wall appears mildly  thickened and edematous throughout that region, however, this appears to be chronic as similar findings were evident on remote prior study from 2017. No pericholecystic fluid or surrounding inflammatory changes are noted. More proximal aspect of the gallbladder leading to the cystic duct is otherwise unremarkable in appearance. No intra or extrahepatic biliary ductal dilatation. Pancreas: No pancreatic mass. No pancreatic ductal dilatation. No pancreatic or peripancreatic fluid collections or inflammatory changes. Spleen: Unremarkable. Adrenals/Urinary Tract: Subcentimeter low-attenuation lesion in the left kidney, too small to definitively characterize, but statistically likely a tiny cyst (no imaging follow-up recommended). Right kidney and bilateral adrenal glands are normal in appearance. No hydroureteronephrosis. 8 mm calculus in the right-side of the urinary bladder posteriorly adjacent to the right ureterovesicular junction. Urinary bladder is otherwise unremarkable in appearance. Stomach/Bowel: The appearance of the stomach is normal. No pathologic dilatation of small bowel or colon. Normal appendix. Vascular/Lymphatic: Atherosclerosis in the abdominal aorta and pelvic vasculature. No evidence of aneurysm or dissection in the abdominal or pelvic vasculature. No lymphadenopathy noted in the abdomen or pelvis. Reproductive: Prostate gland and seminal vesicles are unremarkable in appearance. Other: No significant volume of ascites.  No pneumoperitoneum. 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 There are no aggressive appearing lytic or blastic lesions noted in the visualized portions of the skeleton. IMPRESSION: 1. No definite acute findings are noted in the abdomen or pelvis to account for the patient's symptoms. 2. Small left pleural effusion. 3. Cholelithiasis with unusual appearance of the gallbladder which is similar to remote  prior study from 2017, without definitive imaging findings to suggest an acute cholecystitis at this time. 4. Nonobstructive calculus in the right-side of the urinary bladder measuring 8 mm adjacent to the right ureterovesicular junction. No definite ureteral stone or findings to suggest urinary tract obstruction noted at this time. 5. Aortic atherosclerosis. 6. Additional incidental findings, as above. Electronically Signed   By: Trudie Reed M.D.   On: 06/20/2023 09:33    Pending Labs Unresulted Labs (From admission, onward)  Start     Ordered   06/20/23 1404  TSH  Once,   R        06/20/23 1403   06/20/23 1404  Type and screen [facility]  Once,   R       Comments: [facility]    06/20/23 1403            Vitals/Pain Today's Vitals   06/20/23 1125 06/20/23 1200 06/20/23 1235 06/20/23 1326  BP: 106/63  (!) 109/50   Pulse: 64  65   Resp: 19  16   Temp: 98.2 F (36.8 C)     TempSrc: Oral     SpO2: 96%  99%   Weight:      Height:      PainSc:  0-No pain 0-No pain 0-No pain    Isolation Precautions No active isolations  Medications Medications  piperacillin-tazobactam (ZOSYN) IVPB 3.375 g (has no administration in time range)  ondansetron (ZOFRAN) injection 4 mg (4 mg Intravenous Given 06/20/23 0807)  HYDROmorphone (DILAUDID) injection 1 mg (1 mg Intravenous Given 06/20/23 0806)  iohexol (OMNIPAQUE) 350 MG/ML injection 75 mL (75 mLs Intravenous Contrast Given 06/20/23 0849)  cefTRIAXone (ROCEPHIN) 2 g in sodium chloride 0.9 % 100 mL IVPB (0 g Intravenous Stopped 06/20/23 1325)    Mobility walks     Focused Assessments     R Recommendations: See Admitting Provider Note  Report given to:   Additional Notes:

## 2023-06-20 NOTE — Progress Notes (Signed)
Pharmacy Antibiotic Note  Gregory Jenkins is a 72 y.o. male admitted on 06/20/2023 presenting with abdominal pain, concern for cholecystitis.  Pharmacy has been consulted for zosyn dosing.  Plan: Zosyn 3.375g IV every 8 hours (extended 4h infusion) Monitor renal function, surgery plans and LOT  Height: 6' (182.9 cm) Weight: 77.1 kg (170 lb) IBW/kg (Calculated) : 77.6  Temp (24hrs), Avg:97.8 F (36.6 C), Min:97.4 F (36.3 C), Max:98.2 F (36.8 C)  Recent Labs  Lab 06/20/23 0728  WBC 7.4  CREATININE 0.98    Estimated Creatinine Clearance: 74.3 mL/min (by C-G formula based on SCr of 0.98 mg/dL).    No Known Allergies  Daylene Posey, PharmD, [facility] Clinical Pharmacist ED Pharmacist Phone # [phone] 06/20/2023 1:59 PM

## 2023-06-20 NOTE — Consult Note (Signed)
Reason for Consult:ab pain Referring Physician: Jannifer Hick PA  Gregory Jenkins is an 72 y.o. male.  HPI: 10 yom with history of tonsillar cancer s/p chemo rads (he had a gastrostomy tube at that time), kidney stones and prior pe in 2020 with new left ue dvt in May of this year on eliquis since then who underwent first rib resection by Dr Lenell Antu earlier this month. On eliquis and took 8/24 at 5 pm.  He had acute ab pain in upper abdomen overnight.  Gotten worse, nothing helping, has some nausea no emesis.  Now feels better after pain meds.  Nl lfts and wbc 7.4  Ct showed cholelithiasis and US shows cholelithiasis with mild gbw thickening and sono Murphys sign. I was asked to see him  Past Medical History:  Diagnosis Date   Abdominal pain 03/19/2016   Allergic rhinitis    Cancer of tonsillar fossa (HCC)    left   Clotting disorder (HCC)    PE, suspect lupus anticoagulant   Drug-induced skin rash 12/25/2015   History of kidney stones 2000   History of nephrolithiasis    History of pulmonary embolism    History of radiation therapy 12/17/2015- 02/03/16   Left Tonsil and Bilateral Neck   Hypothyroidism    Mucositis oral 01/08/2016   Sleep apnea 2000   Thyroid disease    Tonsil cancer [facility])     Past Surgical History:  Procedure Laterality Date   AMPUTATION Left 01/01/2018   Procedure: REVISION AMPUTATION RING FINGER;  Surgeon: Bradly Bienenstock, MD;  Location: [facility] OR;  Service: Orthopedics;  Laterality: Left;   CARPAL TUNNEL RELEASE Left 11/14/2021   Procedure: LEFT CARPAL TUNNEL RELEASE;  Surgeon: Betha Loa, MD;  Location: [facility];  Service: Orthopedics;  Laterality: Left;  30 MIN   EYE SURGERY Right 1980's   H/O right orbital "blowout" fracture   FOOT SURGERY Left    [place] Neuroma   GASTROSTOMY TUBE PLACEMENT     IR GASTROSTOMY TUBE REMOVAL     IR GENERIC HISTORICAL  10/12/2016   IR REMOVAL TUN ACCESS W/ PORT W/O FL MOD SED 10/12/2016 Gilmer Mor, DO [facility]-INTER[facility] RAD    KNEE SURGERY Right    Arthroscopic   PORTA CATH INSERTION     RIB RESECTION Left 05/31/2023   Procedure: LEFT FIRST RIB EXCISION;  Surgeon: Leonie Douglas, MD;  Location: [facility] OR;  Service: Vascular;  Laterality: Left;   VENOGRAM N/A 05/31/2023   Procedure: LEFT UPPER EXTREMITY AND CENTRAL VENOGRAM;  Surgeon: Leonie Douglas, MD;  Location: [facility] OR;  Service: Vascular;  Laterality: N/A;    Family History  Problem Relation Age of Onset   Cancer Father        esophageal ca   Cancer Maternal Grandmother        brain ca   Cancer Maternal Grandfather        lung ca   COPD Mother    Cancer Maternal Aunt        breast ca    Social History:  reports that he has never smoked. He quit smokeless tobacco use about 16 years ago.  His smokeless tobacco use included chew. He reports current alcohol use. He reports that he does not use drugs.  Allergies: No Known Allergies  No current facility-administered medications for this encounter.   Current Outpatient Medications  Medication Sig Dispense Refill   apixaban (ELIQUIS) 5 MG TABS tablet Take 1 tablet (5 mg total) by mouth  2 (two) times daily. 60 tablet 5   aspirin EC 81 MG tablet Take 81 mg by mouth daily.     atorvastatin (LIPITOR) 10 MG tablet Take 10 mg by mouth daily.     cyanocobalamin (VITAMIN B12) 1000 MCG/ML injection Inject 1,000 mcg into the muscle every 30 (thirty) days.     ipratropium (ATROVENT) 0.06 % nasal spray Place 2 sprays into both nostrils every morning.     levothyroxine (SYNTHROID) 125 MCG tablet Take 125 mcg by mouth daily before breakfast.  3   oxyCODONE (OXY IR/ROXICODONE) 5 MG immediate release tablet Take 1 tablet (5 mg total) by mouth every 4 (four) hours as needed for severe pain. 15 tablet 0     Results for orders placed or performed during the hospital encounter of 06/20/23 (from the past 48 hour(s))  Urinalysis, Routine w reflex microscopic -Urine, Clean Catch     Status: None   Collection Time: 06/20/23  7:23  AM  Result Value Ref Range   Color, Urine YELLOW YELLOW   APPearance CLEAR CLEAR   Specific Gravity, Urine 1.019 1.005 - 1.030   pH 5.0 5.0 - 8.0   Glucose, UA NEGATIVE NEGATIVE mg/dL   Hgb urine dipstick NEGATIVE NEGATIVE   Bilirubin Urine NEGATIVE NEGATIVE   Ketones, ur NEGATIVE NEGATIVE mg/dL   Protein, ur NEGATIVE NEGATIVE mg/dL   Nitrite NEGATIVE NEGATIVE   Leukocytes,Ua NEGATIVE NEGATIVE    Comment: Performed at [facility], [address] [place], [place] [address]  Lipase, blood     Status: None   Collection Time: 06/20/23  7:28 AM  Result Value Ref Range   Lipase 34 11 - 51 U/L    Comment: Performed at [facility], [address] [place], [place] [address]  Comprehensive metabolic panel     Status: Abnormal   Collection Time: 06/20/23  7:28 AM  Result Value Ref Range   Sodium 135 135 - 145 mmol/L   Potassium 4.0 3.5 - 5.1 mmol/L   Chloride 103 98 - 111 mmol/L   CO2 24 22 - 32 mmol/L   Glucose, Bld 143 (H) 70 - 99 mg/dL    Comment: Glucose reference range applies only to samples taken after fasting for at least 8 hours.   BUN 15 8 - 23 mg/dL   Creatinine, Ser 6.07 0.61 - 1.24 mg/dL   Calcium 8.9 8.9 - 37.1 mg/dL   Total Protein 7.1 6.5 - 8.1 g/dL   Albumin 3.3 (L) 3.5 - 5.0 g/dL   AST 17 15 - 41 U/L   ALT 14 0 - 44 U/L   Alkaline Phosphatase 82 38 - 126 U/L   Total Bilirubin 0.5 0.3 - 1.2 mg/dL   GFR, Estimated >06 >26 mL/min    Comment: (NOTE) Calculated using the CKD-EPI Creatinine Equation (2021)    Anion gap 8 5 - 15    Comment: Performed at [facility], [name], [place], [place] [address]  CBC     Status: Abnormal   Collection Time: 06/20/23  7:28 AM  Result Value Ref Range   WBC 7.4 4.0 - 10.5 K/uL   RBC 3.98 (L) 4.22 - 5.81 MIL/uL   Hemoglobin 12.3 (L) 13.0 - 17.0 g/dL   HCT 62.7 (L) 03.5 - 00.9 %   MCV 96.5 80.0 - 100.0 fL   MCH 30.9 26.0 - 34.0 pg   MCHC 32.0 30.0 - 36.0 g/dL   RDW 38.1 82.9 - 93.7 %  Platelets 318  150 - 400 K/uL   nRBC 0.0 0.0 - 0.2 %    Comment: Performed at [facility], [address] [place], [place] [address]    US Abdomen Limited RUQ (LIVER/GB)  Result Date: 06/20/2023 CLINICAL DATA:  Right upper quadrant abdominal pain. EXAM: ULTRASOUND ABDOMEN LIMITED RIGHT UPPER QUADRANT COMPARISON:  CT of the abdomen and pelvis with contrast 06/20/2023 FINDINGS: Gallbladder: Multiple small stones are again noted at the neck of the gallbladder. Gallbladder is now somewhat contracted. This likely exaggerates wall thickening. The wall measures 4 mm. A sonographic Eulah Pont sign is reported. Common bile duct: Diameter: 3 mm, within normal limits Liver: The liver is mildly echogenic. No discrete lesions are present. Portal vein is patent on color Doppler imaging with normal direction of blood flow towards the liver. Other: None. IMPRESSION: 1. Cholelithiasis with mild gallbladder wall thickening and a sonographic Murphy sign. Findings are concerning for acute cholecystitis. 2. Mild hepatic steatosis. Electronically Signed   By: Marin Roberts M.D.   On: 06/20/2023 11:12   CT ABDOMEN PELVIS W CONTRAST  Result Date: 06/20/2023 CLINICAL DATA:  72 year old male with history of acute onset of nonlocalized abdominal pain. EXAM: CT ABDOMEN AND PELVIS WITH CONTRAST TECHNIQUE: Multidetector CT imaging of the abdomen and pelvis was performed using the standard protocol following bolus administration of intravenous contrast. RADIATION DOSE REDUCTION: This exam was performed according to the departmental dose-optimization program which includes automated exposure control, adjustment of the mA and/or kV according to patient size and/or use of iterative reconstruction technique. CONTRAST:  75mL OMNIPAQUE IOHEXOL 350 MG/ML SOLN COMPARISON:  CT of the abdomen and pelvis 03/20/2016. FINDINGS: Lower chest: Small left pleural effusion with some passive subsegmental atelectasis in the left lower lobe. Hepatobiliary: No  suspicious cystic or solid hepatic lesions. No intra or extrahepatic biliary ductal dilatation. There is a waist like narrowing of the mid gallbladder where there is some enhancing soft tissue, best appreciated on coronal image 53 of series 6. Beyond this region there are numerous calcified and noncalcified gallstones filling the lumen of the gallbladder to the level of the fundus. Gallbladder wall appears mildly thickened and edematous throughout that region, however, this appears to be chronic as similar findings were evident on remote prior study from 2017. No pericholecystic fluid or surrounding inflammatory changes are noted. More proximal aspect of the gallbladder leading to the cystic duct is otherwise unremarkable in appearance. No intra or extrahepatic biliary ductal dilatation. Pancreas: No pancreatic mass. No pancreatic ductal dilatation. No pancreatic or peripancreatic fluid collections or inflammatory changes. Spleen: Unremarkable. Adrenals/Urinary Tract: Subcentimeter low-attenuation lesion in the left kidney, too small to definitively characterize, but statistically likely a tiny cyst (no imaging follow-up recommended). Right kidney and bilateral adrenal glands are normal in appearance. No hydroureteronephrosis. 8 mm calculus in the right-side of the urinary bladder posteriorly adjacent to the right ureterovesicular junction. Urinary bladder is otherwise unremarkable in appearance. Stomach/Bowel: The appearance of the stomach is normal. No pathologic dilatation of small bowel or colon. Normal appendix. Vascular/Lymphatic: Atherosclerosis in the abdominal aorta and pelvic vasculature. No evidence of aneurysm or dissection in the abdominal or pelvic vasculature. No lymphadenopathy noted in the abdomen or pelvis. Reproductive: Prostate gland and seminal vesicles are unremarkable in appearance. Other: No significant volume of ascites.  No pneumoperitoneum. 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 There are no aggressive appearing lytic or blastic lesions  noted in the visualized portions of the skeleton. IMPRESSION: 1. No definite acute findings are noted in the abdomen or pelvis to account for the patient's symptoms. 2. Small left pleural effusion. 3. Cholelithiasis with unusual appearance of the gallbladder which is similar to remote prior study from 2017, without definitive imaging findings to suggest an acute cholecystitis at this time. 4. Nonobstructive calculus in the right-side of the urinary bladder measuring 8 mm adjacent to the right ureterovesicular junction. No definite ureteral stone or findings to suggest urinary tract obstruction noted at this time. 5. Aortic atherosclerosis. 6. Additional incidental findings, as above. Electronically Signed   By: Trudie Reed M.D.   On: 06/20/2023 09:33    Review of Systems  Constitutional:  Negative for fever.  Respiratory:  Negative for shortness of breath.   Gastrointestinal:  Positive for abdominal pain. Negative for nausea and vomiting.  All other systems reviewed and are negative.  Blood pressure (!) 109/50, pulse 65, temperature 98.2 F (36.8 C), temperature source Oral, resp. rate 16, height 6' (1.829 m), weight 77.1 kg, SpO2 99%. Physical Exam Vitals reviewed.  Constitutional:      Appearance: He is well-developed.  Eyes:     General: No scleral icterus. Cardiovascular:     Rate and Rhythm: Normal rate and regular rhythm.  Pulmonary:     Effort: Pulmonary effort is normal.     Breath sounds: No wheezing.  Chest:    Abdominal:     General: There is no distension.     Palpations: Abdomen is soft.     Tenderness: There is abdominal tenderness (mild) in the right upper quadrant and epigastric area. Negative signs include Murphy's sign.     Hernia: No hernia is present.  Skin:    General: Skin is warm and dry.     Capillary Refill: Capillary refill  takes less than 2 seconds.     Coloration: Skin is not jaundiced.  Neurological:     General: No focal deficit present.     Mental Status: He is alert.  Psychiatric:        Mood and Affect: Mood normal.     Assessment/Plan: Symptomatic cholelithiasis, ? Cholecystitis -he still is mildly tender on exam and is male so certainly could have cholecystitis. I think needs lap chole and don't think he will be able to go home and do this electively. -admission, npo after mn -do not start eliquis -I discussed with vascular surgery and will not start heparin and have Dr Lenell Antu touch base in am tomrrow -will do abx  I discussed case with ER pa and vascular surgery on call. I reviewed all labs, ct scan and [place] with gallstones.  Emelia Loron 06/20/2023, 1:20 PM

## 2023-06-20 NOTE — H&P (Signed)
History and Physical    Patient: Gregory Jenkins:[id number] DOB: 1951/05/05 DOA: 06/20/2023 DOS: the patient was seen and examined on 06/20/2023 PCP: Soundra Pilon, FNP  Patient coming from: Home - lives with his wife    Chief Complaint: abdominal pain   HPI: Gregory Jenkins is a 72 y.o. 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 Lenell Antu on 05/31/23, DVT left subclavian 5/24 on eliquis who presented to ED with sudden onset of abdominal pain and nausea that started around 2AM this morning. Pain epigastric area with radiation to the back rated as an 8/10 with associated nausea, no vomiting. He has had no fever/chills. He ate a hotdog, chili and some ice cream [place] the rodeo last night. Last dose of eliquis was at 5pm yesterday.    He has been feeling good. Denies any fever/chills, vision changes/headaches, chest pain or palpitations, shortness of breath or cough, diarrhea, dysuria or leg swelling.    He does not smoke or drink alcohol.   ER Course:  vitals: afebrile, bp: 138/68, HR: 53, RR: 18, oxygen: 99%RA Pertinent labs: none Ct abdomen/pelvis: No definite acute findings are noted in the abdomen or pelvis to account for the patient's symptoms. 2. Small left pleural effusion. 3. Cholelithiasis with unusual appearance of the gallbladder which is similar to remote prior study from 2017, without definitive imaging findings to suggest an acute cholecystitis at this time. 4. Nonobstructive calculus in the right-side of the urinary bladder measuring 8 mm adjacent to the right ureterovesicular junction. No definite ureteral stone or findings to suggest urinary tract obstruction noted at this time. 5. Aortic atherosclerosis. RUQ [place]:  Cholelithiasis with mild gallbladder wall thickening and a sonographic Murphy sign. Findings  are concerning for acute cholecystitis. 2. Mild hepatic steatosis. In ED: general surgery consulted. Given rocephin, pain meds, TRH asked to admit.     Review of Systems: As mentioned in the history of present illness. All other systems reviewed and are negative. Past Medical History:  Diagnosis Date   Abdominal pain 03/19/2016   Allergic rhinitis    Cancer of tonsillar fossa (HCC)    left   Clotting disorder (HCC)    PE, suspect lupus anticoagulant   Drug-induced skin rash 12/25/2015   History of kidney stones 2000   History of nephrolithiasis    History of pulmonary embolism    History of radiation therapy 12/17/2015- 02/03/16   Left Tonsil and Bilateral Neck   Hypothyroidism    Mucositis oral 01/08/2016   Sleep apnea 2000   Thyroid disease    Tonsil cancer [facility])    Past Surgical History:  Procedure Laterality Date   AMPUTATION Left 01/01/2018   Procedure: REVISION AMPUTATION RING FINGER;  Surgeon: Bradly Bienenstock, MD;  Location: [facility] OR;  Service: Orthopedics;  Laterality: Left;   CARPAL TUNNEL RELEASE Left 11/14/2021   Procedure: LEFT CARPAL TUNNEL RELEASE;  Surgeon: Betha Loa, MD;  Location: [facility];  Service: Orthopedics;  Laterality: Left;  30 MIN   EYE SURGERY Right 1980's   H/O right orbital "blowout" fracture   FOOT SURGERY Left    [place] Neuroma   GASTROSTOMY TUBE PLACEMENT     IR GASTROSTOMY TUBE REMOVAL     IR GENERIC HISTORICAL  10/12/2016   IR REMOVAL TUN ACCESS W/ PORT W/O FL MOD SED  10/12/2016 Gilmer Mor, DO [facility]-INTERV RAD   KNEE SURGERY Right    Arthroscopic   PORTA CATH INSERTION     RIB RESECTION Left 05/31/2023   Procedure: LEFT FIRST RIB EXCISION;  Surgeon: Leonie Douglas, MD;  Location: [facility] OR;  Service: Vascular;  Laterality: Left;   VENOGRAM N/A 05/31/2023   Procedure: LEFT UPPER EXTREMITY AND CENTRAL VENOGRAM;  Surgeon: Leonie Douglas, MD;  Location: [facility] OR;  Service: Vascular;  Laterality: N/A;   Social History:  reports that he  has never smoked. He quit smokeless tobacco use about 16 years ago.  His smokeless tobacco use included chew. He reports current alcohol use. He reports that he does not use drugs.  No Known Allergies  Family History  Problem Relation Age of Onset   Cancer Father        esophageal ca   Cancer Maternal Grandmother        brain ca   Cancer Maternal Grandfather        lung ca   COPD Mother    Cancer Maternal Aunt        breast ca    Prior to Admission medications   Medication Sig Start Date End Date Taking? Authorizing Provider  apixaban (ELIQUIS) 5 MG TABS tablet Take 1 tablet (5 mg total) by mouth 2 (two) times daily. 03/25/23   Pervis Hocking B, RPH-CPP  aspirin EC 81 MG tablet Take 81 mg by mouth daily.    [provider]  atorvastatin (LIPITOR) 10 MG tablet Take 10 mg by mouth daily. 09/24/21   [provider]  cyanocobalamin (VITAMIN B12) 1000 MCG/ML injection Inject 1,000 mcg into the muscle every 30 (thirty) days.    [provider]  ipratropium (ATROVENT) 0.06 % nasal spray Place 2 sprays into both nostrils every morning.    [provider]  levothyroxine (SYNTHROID) 125 MCG tablet Take 125 mcg by mouth daily before breakfast. 01/15/18   [provider]  oxyCODONE (OXY IR/ROXICODONE) 5 MG immediate release tablet Take 1 tablet (5 mg total) by mouth every 4 (four) hours as needed for severe pain. 06/01/23   Ernestene Mention, PA-C    Physical Exam: Vitals:   06/20/23 0720 06/20/23 0815 06/20/23 1125 06/20/23 1235  BP:  131/65 106/63 (!) 109/50  Pulse:  68 64 65  Resp:  (!) 21 19 16   Temp:   98.2 F (36.8 C)   TempSrc:   Oral   SpO2:  92% 96% 99%  Weight: 77.1 kg     Height: 6' (1.829 m)      General:  Appears calm and comfortable and is in NAD Eyes:  PERRL, EOMI, normal lids, iris ENT:  grossly normal hearing, lips & tongue, mmm; appropriate dentition Neck:  no LAD, masses or thyromegaly; no carotid bruits Cardiovascular:  RRR,  no m/r/g. No LE edema.  Respiratory:   CTA bilaterally with no wheezes/rales/rhonchi.  Normal respiratory effort. Abdomen:  soft, NT, ND, NABS Back:   normal alignment, no CVAT Skin:  no rash or induration seen on limited exam. Skin taunt on right cervical area  Musculoskeletal:  grossly normal tone BUE/BLE, good ROM, no bony abnormality Lower extremity:  No LE edema.  Limited foot exam with no ulcerations.  2+ distal pulses. Psychiatric:  grossly normal mood and affect, speech fluent and appropriate, AOx3 Neurologic:  CN 2-12 grossly intact, moves all extremities in coordinated fashion, sensation intact   Radiological Exams on Admission: Independently reviewed - see  discussion in A/P where applicable  US Abdomen Limited RUQ (LIVER/GB)  Result Date: 06/20/2023 CLINICAL DATA:  Right upper quadrant abdominal pain. EXAM: ULTRASOUND ABDOMEN LIMITED RIGHT UPPER QUADRANT COMPARISON:  CT of the abdomen and pelvis with contrast 06/20/2023 FINDINGS: Gallbladder: Multiple small stones are again noted at the neck of the gallbladder. Gallbladder is now somewhat contracted. This likely exaggerates wall thickening. The wall measures 4 mm. A sonographic Eulah Pont sign is reported. Common bile duct: Diameter: 3 mm, within normal limits Liver: The liver is mildly echogenic. No discrete lesions are present. Portal vein is patent on color Doppler imaging with normal direction of blood flow towards the liver. Other: None. IMPRESSION: 1. Cholelithiasis with mild gallbladder wall thickening and a sonographic Murphy sign. Findings are concerning for acute cholecystitis. 2. Mild hepatic steatosis. Electronically Signed   By: Marin Roberts M.D.   On: 06/20/2023 11:12   CT ABDOMEN PELVIS W CONTRAST  Result Date: 06/20/2023 CLINICAL DATA:  72 year old male with history of acute onset of nonlocalized abdominal pain. EXAM: CT ABDOMEN AND PELVIS WITH CONTRAST TECHNIQUE: Multidetector CT imaging of the abdomen and pelvis  was performed using the standard protocol following bolus administration of intravenous contrast. RADIATION DOSE REDUCTION: This exam was performed according to the departmental dose-optimization program which includes automated exposure control, adjustment of the mA and/or kV according to patient size and/or use of iterative reconstruction technique. CONTRAST:  75mL OMNIPAQUE IOHEXOL 350 MG/ML SOLN COMPARISON:  CT of the abdomen and pelvis 03/20/2016. FINDINGS: Lower chest: Small left pleural effusion with some passive subsegmental atelectasis in the left lower lobe. Hepatobiliary: No suspicious cystic or solid hepatic lesions. No intra or extrahepatic biliary ductal dilatation. There is a waist like narrowing of the mid gallbladder where there is some enhancing soft tissue, best appreciated on coronal image 53 of series 6. Beyond this region there are numerous calcified and noncalcified gallstones filling the lumen of the gallbladder to the level of the fundus. Gallbladder wall appears mildly thickened and edematous throughout that region, however, this appears to be chronic as similar findings were evident on remote prior study from 2017. No pericholecystic fluid or surrounding inflammatory changes are noted. More proximal aspect of the gallbladder leading to the cystic duct is otherwise unremarkable in appearance. No intra or extrahepatic biliary ductal dilatation. Pancreas: No pancreatic mass. No pancreatic ductal dilatation. No pancreatic or peripancreatic fluid collections or inflammatory changes. Spleen: Unremarkable. Adrenals/Urinary Tract: Subcentimeter low-attenuation lesion in the left kidney, too small to definitively characterize, but statistically likely a tiny cyst (no imaging follow-up recommended). Right kidney and bilateral adrenal glands are normal in appearance. No hydroureteronephrosis. 8 mm calculus in the right-side of the urinary bladder posteriorly adjacent to the right ureterovesicular  junction. Urinary bladder is otherwise unremarkable in appearance. Stomach/Bowel: The appearance of the stomach is normal. No pathologic dilatation of small bowel or colon. Normal appendix. Vascular/Lymphatic: Atherosclerosis in the abdominal aorta and pelvic vasculature. No evidence of aneurysm or dissection in the abdominal or pelvic vasculature. No lymphadenopathy noted in the abdomen or pelvis. Reproductive: Prostate gland and seminal vesicles are unremarkable in appearance. Other: No significant volume of ascites.  No pneumoperitoneum. 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 There are no aggressive appearing lytic or blastic lesions noted in the visualized portions of the skeleton. IMPRESSION: 1. No definite acute findings are noted in the abdomen or pelvis to account for the patient's symptoms. 2. Small  left pleural effusion. 3. Cholelithiasis with unusual appearance of the gallbladder which is similar to remote prior study from 2017, without definitive imaging findings to suggest an acute cholecystitis at this time. 4. Nonobstructive calculus in the right-side of the urinary bladder measuring 8 mm adjacent to the right ureterovesicular junction. No definite ureteral stone or findings to suggest urinary tract obstruction noted at this time. 5. Aortic atherosclerosis. 6. Additional incidental findings, as above. Electronically Signed   By: Trudie Reed M.D.   On: 06/20/2023 09:33    EKG: Independently reviewed.  Sinus bradycardia with rate 58; nonspecific ST changes with no evidence of acute ischemia No previous EKG    Labs on Admission: I have personally reviewed the available labs and imaging studies at the time of the admission.  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  -admit to med-surg -no leukocytosis, transaminitis or SIRS/sepsis criteria -general surgery consulted -NPO at midnight -last took eliquis at 5pm on 8/24. Hold eliquis. General surgery discussed with vascular, no heparin for now. Will discuss again in AM  -zosyn  -IVF  -anti-emetics -pain control as needed, currently pain free    Acute deep vein thrombosis (DVT) of left upper extremity (HCC) Diagnosed 02/2023, followed by vascular surgery General surgery touched base with vascular. Hold eliquis, no heparin. They will touch base tomorrow  Venous thoracic outlet syndrome of left subclavian vein S/p first rib excision by vascular surgery on 05/31/23  Malignant neoplasm of tonsillar fossa (HCC) S/p chemo/radiation in 2017 Followed routinely by ENT and oncology   Hypothyroidism Check TSH Continue home synthroid     Advance Care Planning:   Code Status: Full Code   Consults: general surgery: Dr. Dwain Sarna   DVT Prophylaxis: TED hose  Family Communication: wife at bedside   Severity of Illness: The appropriate patient status for this patient is INPATIENT. Inpatient status is judged to be reasonable and necessary in order to provide the required intensity of service to ensure the patient's safety. The patient's presenting symptoms, physical exam findings, and initial radiographic and laboratory data in the context of their chronic comorbidities is felt to place them at high risk for further clinical deterioration. Furthermore, it is not anticipated that the patient will be  medically stable for discharge from the hospital within 2 midnights of admission.   * I certify that at the point of admission it is my clinical judgment that the patient will require inpatient hospital care spanning beyond 2 midnights from the point of admission due to high intensity of service, high risk for further deterioration and high frequency of surveillance required.*  Author: Orland Mustard, MD 06/20/2023 2:42 PM  For on call review [email or web address].

## 2023-06-20 NOTE — Assessment & Plan Note (Signed)
Diagnosed 02/2023, followed by vascular surgery General surgery touched base with vascular. Hold eliquis, no heparin. They will touch base tomorrow

## 2023-06-20 NOTE — Assessment & Plan Note (Signed)
S/p chemo/radiation in 2017 Followed routinely by ENT and oncology

## 2023-06-20 NOTE — ED Notes (Signed)
Patient transported to Ultrasound 

## 2023-06-20 NOTE — ED Provider Notes (Signed)
[facility] AT [facility] Provider Note   CSN: [id number] Arrival date & time: 06/20/23  0701     History  Chief Complaint  Patient presents with   Abdominal Pain   Back Pain    LONNEY SISLEY is a 72 y.o. male with medical history of tonsil cancer, hypothyroid, history of PE, history of kidney stones, thoracic outlet syndrome, recent left first rib excision.  Patient presents to ED for evaluation of umbilical abdominal pain radiating to his back.  The patient reports that this pain began this morning at 2 AM, waking him from his sleep.  He states that the pain is progressively worsened since this time.  He states that the pain is primarily located around his umbilicus but also radiates to his right flank.  He endorses a history of kidney stones.  He denies any dysuria but is endorsing nausea without vomiting.  Denies diarrhea, fevers, chest pain, shortness of breath.  Denies blood in stool, NSAID use, EtOH use.  States he is compliant on blood thinning medication.  Denies history of atrial fibrillation.  Rates pain 8 out of 10.  Denies alleviating or aggravating factors.   Abdominal Pain Associated symptoms: nausea   Associated symptoms: no chest pain, no diarrhea, no fever, no shortness of breath and no vomiting   Back Pain Associated symptoms: abdominal pain   Associated symptoms: no chest pain and no fever        Home Medications Prior to Admission medications   Medication Sig Start Date End Date Taking? Authorizing Provider  acetaminophen (TYLENOL) 500 MG tablet Take 1,000 mg by mouth every 6 (six) hours as needed for moderate pain.   Yes [provider]  apixaban (ELIQUIS) 5 MG TABS tablet Take 1 tablet (5 mg total) by mouth 2 (two) times daily. 03/25/23  Yes Pervis Hocking B, RPH-CPP  aspirin EC 81 MG tablet Take 81 mg by mouth daily.   Yes [provider]  atorvastatin (LIPITOR) 10 MG tablet Take 10 mg by mouth daily. 09/24/21   Yes [provider]  cyanocobalamin (VITAMIN B12) 1000 MCG/ML injection Inject 1,000 mcg into the muscle every 30 (thirty) days.   Yes [provider]  ipratropium (ATROVENT) 0.06 % nasal spray Place 2 sprays into both nostrils daily as needed for rhinitis.   Yes [provider]  levothyroxine (SYNTHROID) 125 MCG tablet Take 125 mcg by mouth daily before breakfast. 01/15/18  Yes [provider]  oxyCODONE (OXY IR/ROXICODONE) 5 MG immediate release tablet Take 1 tablet (5 mg total) by mouth every 4 (four) hours as needed for severe pain. Patient not taking: Reported on 06/20/2023 06/01/23   Loel Dubonnet P, PA-C      Allergies    Patient has no known allergies.    Review of Systems   Review of Systems  Constitutional:  Negative for fever.  Respiratory:  Negative for shortness of breath.   Cardiovascular:  Negative for chest pain.  Gastrointestinal:  Positive for abdominal pain and nausea. Negative for diarrhea and vomiting.  Musculoskeletal:  Positive for back pain.  All other systems reviewed and are negative.   Physical Exam Updated Vital Signs BP (!) 109/50   Pulse 65   Temp 98.2 F (36.8 C) (Oral)   Resp 16   Ht 6' (1.829 m)   Wt 77.1 kg   SpO2 99%   BMI 23.06 kg/m  Physical Exam Vitals and nursing note reviewed.  Constitutional:  General: He is not in acute distress.    Appearance: Normal appearance. He is not ill-appearing, toxic-appearing or diaphoretic.  HENT:     Head: Normocephalic and atraumatic.     Nose: Nose normal.     Mouth/Throat:     Mouth: Mucous membranes are moist.     Pharynx: Oropharynx is clear.  Eyes:     Extraocular Movements: Extraocular movements intact.     Conjunctiva/sclera: Conjunctivae normal.     Pupils: Pupils are equal, round, and reactive to light.  Cardiovascular:     Rate and Rhythm: Normal rate and regular rhythm.  Pulmonary:     Effort: Pulmonary effort is normal.     Breath sounds:  Normal breath sounds. No wheezing.  Abdominal:     General: Abdomen is flat. Bowel sounds are normal.     Palpations: Abdomen is soft.     Tenderness: There is abdominal tenderness.     Comments: Umbilical, right upper quadrant  Musculoskeletal:     Cervical back: Normal range of motion and neck supple. No tenderness.  Skin:    General: Skin is warm and dry.     Capillary Refill: Capillary refill takes less than 2 seconds.  Neurological:     Mental Status: He is alert and oriented to person, place, and time.     ED Results / Procedures / Treatments   Labs (all labs ordered are listed, but only abnormal results are displayed) Labs Reviewed  COMPREHENSIVE METABOLIC PANEL - Abnormal; Notable for the following components:      Result Value   Glucose, Bld 143 (*)    Albumin 3.3 (*)    All other components within normal limits  CBC - Abnormal; Notable for the following components:   RBC 3.98 (*)    Hemoglobin 12.3 (*)    HCT 38.4 (*)    All other components within normal limits  LIPASE, BLOOD  URINALYSIS, ROUTINE W REFLEX MICROSCOPIC  TSH  TYPE AND SCREEN    EKG EKG Interpretation Date/Time:  Sunday June 20 2023 07:13:39 EDT Ventricular Rate:  58 PR Interval:  152 QRS Duration:  84 QT Interval:  414 QTC Calculation: 406 R Axis:   -20  Text Interpretation: Sinus bradycardia Cannot rule out Anterior infarct , age undetermined Abnormal ECG When compared with ECG of 11-Sep-2009 16:29, PREVIOUS ECG IS PRESENT No significant change since Confirmed by Jacalyn Lefevre [phone]) on 06/20/2023 7:54:56 AM  Radiology US Abdomen Limited RUQ (LIVER/GB)  Result Date: 06/20/2023 CLINICAL DATA:  Right upper quadrant abdominal pain. EXAM: ULTRASOUND ABDOMEN LIMITED RIGHT UPPER QUADRANT COMPARISON:  CT of the abdomen and pelvis with contrast 06/20/2023 FINDINGS: Gallbladder: Multiple small stones are again noted at the neck of the gallbladder. Gallbladder is now somewhat contracted. This  likely exaggerates wall thickening. The wall measures 4 mm. A sonographic Eulah Pont sign is reported. Common bile duct: Diameter: 3 mm, within normal limits Liver: The liver is mildly echogenic. No discrete lesions are present. Portal vein is patent on color Doppler imaging with normal direction of blood flow towards the liver. Other: None. IMPRESSION: 1. Cholelithiasis with mild gallbladder wall thickening and a sonographic Murphy sign. Findings are concerning for acute cholecystitis. 2. Mild hepatic steatosis. Electronically Signed   By: Marin Roberts M.D.   On: 06/20/2023 11:12   CT ABDOMEN PELVIS W CONTRAST  Result Date: 06/20/2023 CLINICAL DATA:  72 year old male with history of acute onset of nonlocalized abdominal pain. EXAM: CT ABDOMEN AND PELVIS WITH CONTRAST TECHNIQUE:  Multidetector CT imaging of the abdomen and pelvis was performed using the standard protocol following bolus administration of intravenous contrast. RADIATION DOSE REDUCTION: This exam was performed according to the departmental dose-optimization program which includes automated exposure control, adjustment of the mA and/or kV according to patient size and/or use of iterative reconstruction technique. CONTRAST:  75mL OMNIPAQUE IOHEXOL 350 MG/ML SOLN COMPARISON:  CT of the abdomen and pelvis 03/20/2016. FINDINGS: Lower chest: Small left pleural effusion with some passive subsegmental atelectasis in the left lower lobe. Hepatobiliary: No suspicious cystic or solid hepatic lesions. No intra or extrahepatic biliary ductal dilatation. There is a waist like narrowing of the mid gallbladder where there is some enhancing soft tissue, best appreciated on coronal image 53 of series 6. Beyond this region there are numerous calcified and noncalcified gallstones filling the lumen of the gallbladder to the level of the fundus. Gallbladder wall appears mildly thickened and edematous throughout that region, however, this appears to be chronic as  similar findings were evident on remote prior study from 2017. No pericholecystic fluid or surrounding inflammatory changes are noted. More proximal aspect of the gallbladder leading to the cystic duct is otherwise unremarkable in appearance. No intra or extrahepatic biliary ductal dilatation. Pancreas: No pancreatic mass. No pancreatic ductal dilatation. No pancreatic or peripancreatic fluid collections or inflammatory changes. Spleen: Unremarkable. Adrenals/Urinary Tract: Subcentimeter low-attenuation lesion in the left kidney, too small to definitively characterize, but statistically likely a tiny cyst (no imaging follow-up recommended). Right kidney and bilateral adrenal glands are normal in appearance. No hydroureteronephrosis. 8 mm calculus in the right-side of the urinary bladder posteriorly adjacent to the right ureterovesicular junction. Urinary bladder is otherwise unremarkable in appearance. Stomach/Bowel: The appearance of the stomach is normal. No pathologic dilatation of small bowel or colon. Normal appendix. Vascular/Lymphatic: Atherosclerosis in the abdominal aorta and pelvic vasculature. No evidence of aneurysm or dissection in the abdominal or pelvic vasculature. No lymphadenopathy noted in the abdomen or pelvis. Reproductive: Prostate gland and seminal vesicles are unremarkable in appearance. Other: No significant volume of ascites.  No pneumoperitoneum. 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 There are no aggressive appearing lytic or blastic lesions noted in the visualized portions of the skeleton. IMPRESSION: 1. No definite acute findings are noted in the abdomen or pelvis to account for the patient's symptoms. 2. Small left pleural effusion. 3. Cholelithiasis with unusual appearance of the gallbladder which is similar to remote prior study from 2017, without definitive imaging findings to suggest an acute  cholecystitis at this time. 4. Nonobstructive calculus in the right-side of the urinary bladder measuring 8 mm adjacent to the right ureterovesicular junction. No definite ureteral stone or findings to suggest urinary tract obstruction noted at this time. 5. Aortic atherosclerosis. 6. Additional incidental findings, as above. Electronically Signed   By: Trudie Reed M.D.   On: 06/20/2023 09:33    Procedures Procedures   Medications Ordered in ED Medications  piperacillin-tazobactam (ZOSYN) IVPB 3.375 g (has no administration in time range)  ondansetron (ZOFRAN) injection 4 mg (4 mg Intravenous Given 06/20/23 0807)  HYDROmorphone (DILAUDID) injection 1 mg (1 mg Intravenous Given 06/20/23 0806)  iohexol (OMNIPAQUE) 350 MG/ML injection 75 mL (75 mLs Intravenous Contrast Given 06/20/23 0849)  cefTRIAXone (ROCEPHIN) 2 g in sodium chloride 0.9 % 100 mL IVPB (0 g Intravenous Stopped 06/20/23 1325)    ED Course/ Medical Decision Making/ A&P    Medical Decision  Making Amount and/or Complexity of Data Reviewed Labs: ordered. Radiology: ordered.  Risk Prescription drug management. Decision regarding hospitalization.   72 year old male presents to ED for evaluation.  Please see HPI for further details.  On examination patient is afebrile and nontachycardic.  His lung sounds are clear bilaterally and he is not hypoxic.  His abdomen has tenderness towards his umbilicus, right upper quadrant.  Has a positive Murphy sign.  No CVA tenderness bilaterally.  Normal neurological examination.  Patient CBC shows no leukocytosis, no anemia.  Patient metabolic panel shows no electrolyte derangement, elevated LFTs, anion gap 8, creatinine is not elevated.  The patient lipase is WNL.  Urinalysis unremarkable.  CT scan of the patient abdomen and pelvis shows small pleural effusion.  Also noted to have cholelithiasis with unusual appearance of the gallbladder which is similar to remote prior study from 2017  without definitive imaging findings to suggest an acute cholecystitis.  Nonobstructive calculus in the right side of the urinary bladder measuring 8 mm adjacent to the right ureterovesicular junction.  Will proceed to ultrasound to rule out cholecystitis.  Ultrasound imaging confirms acute cholecystitis.  Discussed this with on-call surgeon Dr. Dwain Sarna who has requested the patient be admitted to the hospitalist service.  Patient currently on Eliquis secondary to recent DVT so will need to be bridged for 48 hours.  Dr. Dwain Sarna discussed this with vascular surgery.  Patient provided 2 g Rocephin, Zosyn.  Discussed the patient in admission with Dr. Artis Flock, [facility] hospitalist, who has agreed to meet the patient.   Final Clinical Impression(s) / ED Diagnoses Final diagnoses:  Cholecystitis    Rx / DC Orders ED Discharge Orders     None         Clent Ridges 06/20/23 1409    Jacalyn Lefevre, MD 06/20/23 1544

## 2023-06-20 NOTE — Assessment & Plan Note (Signed)
S/p first rib excision by vascular surgery on 05/31/23

## 2023-06-21 DIAGNOSIS — C090 Malignant neoplasm of tonsillar fossa: Secondary | ICD-10-CM

## 2023-06-21 DIAGNOSIS — K810 Acute cholecystitis: Secondary | ICD-10-CM | POA: Diagnosis not present

## 2023-06-21 DIAGNOSIS — E039 Hypothyroidism, unspecified: Secondary | ICD-10-CM

## 2023-06-21 LAB — COMPREHENSIVE METABOLIC PANEL
ALT: 12 U/L (ref 0–44)
AST: 13 U/L — ABNORMAL LOW (ref 15–41)
Albumin: 2.5 g/dL — ABNORMAL LOW (ref 3.5–5.0)
Alkaline Phosphatase: 68 U/L (ref 38–126)
Anion gap: 9 (ref 5–15)
BUN: 9 mg/dL (ref 8–23)
CO2: 26 mmol/L (ref 22–32)
Calcium: 8.8 mg/dL — ABNORMAL LOW (ref 8.9–10.3)
Chloride: 103 mmol/L (ref 98–111)
Creatinine, Ser: 1.08 mg/dL (ref 0.61–1.24)
GFR, Estimated: >60 mL/min (ref >60)
Glucose, Bld: 111 mg/dL — ABNORMAL HIGH (ref 70–99)
Potassium: 4.2 mmol/L (ref 3.5–5.1)
Sodium: 138 mmol/L (ref 135–145)
Total Bilirubin: 0.5 mg/dL (ref 0.3–1.2)
Total Protein: 5.8 g/dL — ABNORMAL LOW (ref 6.5–8.1)

## 2023-06-21 LAB — CBC
HCT: 33.1 % — ABNORMAL LOW (ref 39.0–52.0)
Hemoglobin: 11 g/dL — ABNORMAL LOW (ref 13.0–17.0)
MCH: 32.4 pg (ref 26.0–34.0)
MCHC: 33.2 g/dL (ref 30.0–36.0)
MCV: 97.4 fL (ref 80.0–100.0)
Platelets: 260 10*3/uL (ref 150–400)
RBC: 3.4 MIL/uL — ABNORMAL LOW (ref 4.22–5.81)
RDW: 12.6 % (ref 11.5–15.5)
WBC: 5.6 10*3/uL (ref 4.0–10.5)
nRBC: 0 % (ref 0.0–0.2)

## 2023-06-21 MED ORDER — CHLORHEXIDINE GLUCONATE CLOTH 2 % EX PADS
6.0000 | MEDICATED_PAD | Freq: Once | CUTANEOUS | Status: AC
  Administered 2023-06-21: 6 via TOPICAL

## 2023-06-21 MED ORDER — CHLORHEXIDINE GLUCONATE CLOTH 2 % EX PADS
6.0000 | MEDICATED_PAD | Freq: Once | CUTANEOUS | Status: AC
  Administered 2023-06-22: 6 via TOPICAL

## 2023-06-21 NOTE — Plan of Care (Signed)
Care plan ongoing 06/21/2023 6:34 PM Annalee Genta, RN

## 2023-06-21 NOTE — Progress Notes (Signed)
[facility]                                                                               Gregory Jenkins, is a 72 y.o. male, DOB - [birth year], NGE:[id number] Admit date - 06/20/2023    Outpatient Primary MD for the patient is Gregory Pilon, FNP  LOS - 1  days    Brief summary   72 y.o. 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 Lenell Jenkins on 05/31/23, DVT left subclavian 5/24 on eliquis who presented to ED with sudden onset of abdominal pain and nausea.   RUQ [place]:  Cholelithiasis with mild gallbladder wall thickening and a sonographic Murphy sign. Findings are concerning for acute cholecystitis.  General surgery consulted and plan for lap chole in am.   Assessment & Plan    Assessment and Plan: * Acute cholecystitis 72 year old male presenting with acute on set of abdominal pain early this AM that radiated to his back with associated nausea found to have cholelithiasis with mild gallbladder wall thickening and positive murphy sign concerning for acute cholecystitis.  No leukocytosis or transaminitis. Gen surgery on board and plan for lap chole tomorrow.     Acute deep vein thrombosis (DVT) of left upper extremity (HCC) Diagnosed 02/2023, followed by vascular surgery General surgery touched base with vascular. Hold eliquis, no heparin.   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    Mild anemia of chronic disease Hemoglobin around 11. Monitor.     Estimated body mass index is 23.06 kg/m as calculated from the following:   Height as of this encounter: 6' (1.829 m).   Weight as  of this encounter: 77.1 kg.  Code Status: full code.  DVT Prophylaxis:  Place TED hose Start: 06/20/23 1423   Level of Care: Level of care: Med-Surg Family Communication: faily at bedside  Disposition Plan:     Remains inpatient appropriate:  OR IN AM  Procedures:  LAP CHOLE SCHEDULED TOMORROW  Consultants:   General surgery.   Antimicrobials:   Anti-infectives (From admission, onward)    Start     Dose/Rate Route Frequency Ordered Stop   06/20/23 1400  piperacillin-tazobactam (ZOSYN) IVPB 3.375 g        3.375 g 12.5 mL/hr over 240 Minutes Intravenous Every 8 hours 06/20/23 1358     06/20/23 1230  cefTRIAXone (ROCEPHIN) 2 g in sodium chloride 0.9 % 100 mL IVPB        2 g 200 mL/hr over 30 Minutes Intravenous  Once 06/20/23 1225 06/20/23 1325        Medications  Scheduled Meds:  aspirin EC  81 mg Oral Daily   atorvastatin  10 mg Oral Daily   levothyroxine  125 mcg Oral QAC breakfast   Continuous Infusions:  sodium chloride 75 mL/hr at 06/20/23  1541   piperacillin-tazobactam (ZOSYN)  IV 3.375 g (06/21/23 0552)   PRN Meds:.acetaminophen **OR** acetaminophen, morphine injection, ondansetron (ZOFRAN) IV    Subjective:   Gregory Jenkins was seen and examined today.  No nausea, vomiting.   Objective:   Vitals:   06/20/23 1511 06/20/23 1942 06/21/23 0024 06/21/23 0400  BP: (!) 106/55 (!) 127/59 (!) 110/58 115/74  Pulse: (!) 54 61 (!) 53 (!) 55  Resp: 16 16 16 18   Temp: 98.1 F (36.7 C) 98.3 F (36.8 C) 98.2 F (36.8 C) 98.4 F (36.9 C)  TempSrc: Oral Oral Oral Oral  SpO2: 98% 100% 96% 98%  Weight:      Height:        Intake/Output Summary (Last 24 hours) at 06/21/2023 1248 Last data filed at 06/20/2023 1600 Gross per 24 hour  Intake 354.76 ml  Output --  Net 354.76 ml   Filed Weights   06/20/23 0720  Weight: 77.1 kg     Exam General: Alert and oriented x 3, NAD Cardiovascular: S1 S2 auscultated, no murmurs, RRR Respiratory: Clear to  auscultation bilaterally, no wheezing, rales or rhonchi Gastrointestinal: Soft, nontender, nondistended,  Neuro: AAOx3, Cr N's II- XII. Strength 5/5 upper and lower extremities bilaterally Skin: No rashes Psych: Normal affect and demeanor, alert and oriented x3    Data Reviewed:  I have personally reviewed following labs and imaging studies   CBC Lab Results  Component Value Date   WBC 5.6 06/21/2023   RBC 3.40 (L) 06/21/2023   HGB 11.0 (L) 06/21/2023   HCT 33.1 (L) 06/21/2023   MCV 97.4 06/21/2023   MCH 32.4 06/21/2023   PLT 260 06/21/2023   MCHC 33.2 06/21/2023   RDW 12.6 06/21/2023   LYMPHSABS 0.5 (L) 04/07/2019   MONOABS 0.3 04/07/2019   EOSABS 0.1 04/07/2019   BASOSABS 0.0 04/07/2019     Last metabolic panel Lab Results  Component Value Date   NA 138 06/21/2023   K 4.2 06/21/2023   CL 103 06/21/2023   CO2 26 06/21/2023   BUN 9 06/21/2023   CREATININE 1.08 06/21/2023   GLUCOSE 111 (H) 06/21/2023   GFRNONAA >60 06/21/2023   GFRAA >60 04/07/2019   CALCIUM 8.8 (L) 06/21/2023   PROT 5.8 (L) 06/21/2023   ALBUMIN 2.5 (L) 06/21/2023   BILITOT 0.5 06/21/2023   ALKPHOS 68 06/21/2023   AST 13 (L) 06/21/2023   ALT 12 06/21/2023   ANIONGAP 9 06/21/2023    CBG (last 3)  No results for input(s): "GLUCAP" in the last 72 hours.    Coagulation Profile: No results for input(s): "INR", "PROTIME" in the last 168 hours.   Radiology Studies: US Abdomen Limited RUQ (LIVER/GB)  Result Date: 06/20/2023 CLINICAL DATA:  Right upper quadrant abdominal pain. EXAM: ULTRASOUND ABDOMEN LIMITED RIGHT UPPER QUADRANT COMPARISON:  CT of the abdomen and pelvis with contrast 06/20/2023 FINDINGS: Gallbladder: Multiple small stones are again noted at the neck of the gallbladder. Gallbladder is now somewhat contracted. This likely exaggerates wall thickening. The wall measures 4 mm. A sonographic Eulah Pont sign is reported. Common bile duct: Diameter: 3 mm, within normal limits Liver: The liver  is mildly echogenic. No discrete lesions are present. Portal vein is patent on color Doppler imaging with normal direction of blood flow towards the liver. Other: None. IMPRESSION: 1. Cholelithiasis with mild gallbladder wall thickening and a sonographic Murphy sign. Findings are concerning for acute cholecystitis. 2. Mild hepatic steatosis. Electronically Signed   By: Virl Son.D.  On: 06/20/2023 11:12   CT ABDOMEN PELVIS W CONTRAST  Result Date: 06/20/2023 CLINICAL DATA:  72 year old male with history of acute onset of nonlocalized abdominal pain. EXAM: CT ABDOMEN AND PELVIS WITH CONTRAST TECHNIQUE: Multidetector CT imaging of the abdomen and pelvis was performed using the standard protocol following bolus administration of intravenous contrast. RADIATION DOSE REDUCTION: This exam was performed according to the departmental dose-optimization program which includes automated exposure control, adjustment of the mA and/or kV according to patient size and/or use of iterative reconstruction technique. CONTRAST:  75mL OMNIPAQUE IOHEXOL 350 MG/ML SOLN COMPARISON:  CT of the abdomen and pelvis 03/20/2016. FINDINGS: Lower chest: Small left pleural effusion with some passive subsegmental atelectasis in the left lower lobe. Hepatobiliary: No suspicious cystic or solid hepatic lesions. No intra or extrahepatic biliary ductal dilatation. There is a waist like narrowing of the mid gallbladder where there is some enhancing soft tissue, best appreciated on coronal image 53 of series 6. Beyond this region there are numerous calcified and noncalcified gallstones filling the lumen of the gallbladder to the level of the fundus. Gallbladder wall appears mildly thickened and edematous throughout that region, however, this appears to be chronic as similar findings were evident on remote prior study from 2017. No pericholecystic fluid or surrounding inflammatory changes are noted. More proximal aspect of the gallbladder  leading to the cystic duct is otherwise unremarkable in appearance. No intra or extrahepatic biliary ductal dilatation. Pancreas: No pancreatic mass. No pancreatic ductal dilatation. No pancreatic or peripancreatic fluid collections or inflammatory changes. Spleen: Unremarkable. Adrenals/Urinary Tract: Subcentimeter low-attenuation lesion in the left kidney, too small to definitively characterize, but statistically likely a tiny cyst (no imaging follow-up recommended). Right kidney and bilateral adrenal glands are normal in appearance. No hydroureteronephrosis. 8 mm calculus in the right-side of the urinary bladder posteriorly adjacent to the right ureterovesicular junction. Urinary bladder is otherwise unremarkable in appearance. Stomach/Bowel: The appearance of the stomach is normal. No pathologic dilatation of small bowel or colon. Normal appendix. Vascular/Lymphatic: Atherosclerosis in the abdominal aorta and pelvic vasculature. No evidence of aneurysm or dissection in the abdominal or pelvic vasculature. No lymphadenopathy noted in the abdomen or pelvis. Reproductive: Prostate gland and seminal vesicles are unremarkable in appearance. Other: No significant volume of ascites.  No pneumoperitoneum. 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 There are no aggressive appearing lytic or blastic lesions noted in the visualized portions of the skeleton. IMPRESSION: 1. No definite acute findings are noted in the abdomen or pelvis to account for the patient's symptoms. 2. Small left pleural effusion. 3. Cholelithiasis with unusual appearance of the gallbladder which is similar to remote prior study from 2017, without definitive imaging findings to suggest an acute cholecystitis at this time. 4. Nonobstructive calculus in the right-side of the urinary bladder measuring 8 mm adjacent to the right ureterovesicular junction. No definite ureteral stone  or findings to suggest urinary tract obstruction noted at this time. 5. Aortic atherosclerosis. 6. Additional incidental findings, as above. Electronically Signed   By: Trudie Reed M.D.   On: 06/20/2023 09:33       Kathlen Mody M.D. [facility] 06/21/2023, 12:48 PM  Available via Epic secure chat 7am-7pm After 7 pm, please refer to night coverage provider listed on amion.

## 2023-06-21 NOTE — Plan of Care (Signed)

## 2023-06-21 NOTE — H&P (View-Only) (Signed)
Subjective/Chief Complaint: Pt feels ok min abdominal pain   Objective: Vital signs in last 24 hours: Temp:  [98.1 F (36.7 C)-98.4 F (36.9 C)] 98.4 F (36.9 C) (08/26 0400) Pulse Rate:  [53-65] 55 (08/26 0400) Resp:  [16-19] 18 (08/26 0400) BP: (106-127)/(50-74) 115/74 (08/26 0400) SpO2:  [96 %-100 %] 98 % (08/26 0400) Last BM Date : 06/20/23  Intake/Output from previous day: 08/25 0701 - 08/26 0700 In: 354.8 [P.O.:240; I.V.:23; IV Piggyback:91.8] Out: -  Intake/Output this shift: No intake/output data recorded.  GI: mild TTP RUQ   Lab Results:  Recent Labs    06/20/23 0728 06/21/23 0422  WBC 7.4 5.6  HGB 12.3* 11.0*  HCT 38.4* 33.1*  PLT 318 260   BMET Recent Labs    06/20/23 0728 06/21/23 0422  NA 135 138  K 4.0 4.2  CL 103 103  CO2 24 26  GLUCOSE 143* 111*  BUN 15 9  CREATININE 0.98 1.08  CALCIUM 8.9 8.8*   PT/INR No results for input(s): "LABPROT", "INR" in the last 72 hours. ABG No results for input(s): "PHART", "HCO3" in the last 72 hours.  Invalid input(s): "PCO2", "PO2"  Studies/Results: US Abdomen Limited RUQ (LIVER/GB)  Result Date: 06/20/2023 CLINICAL DATA:  Right upper quadrant abdominal pain. EXAM: ULTRASOUND ABDOMEN LIMITED RIGHT UPPER QUADRANT COMPARISON:  CT of the abdomen and pelvis with contrast 06/20/2023 FINDINGS: Gallbladder: Multiple small stones are again noted at the neck of the gallbladder. Gallbladder is now somewhat contracted. This likely exaggerates wall thickening. The wall measures 4 mm. A sonographic Eulah Pont sign is reported. Common bile duct: Diameter: 3 mm, within normal limits Liver: The liver is mildly echogenic. No discrete lesions are present. Portal vein is patent on color Doppler imaging with normal direction of blood flow towards the liver. Other: None. IMPRESSION: 1. Cholelithiasis with mild gallbladder wall thickening and a sonographic Murphy sign. Findings are concerning for acute cholecystitis. 2. Mild  hepatic steatosis. Electronically Signed   By: Marin Roberts M.D.   On: 06/20/2023 11:12   CT ABDOMEN PELVIS W CONTRAST  Result Date: 06/20/2023 CLINICAL DATA:  72 year old male with history of acute onset of nonlocalized abdominal pain. EXAM: CT ABDOMEN AND PELVIS WITH CONTRAST TECHNIQUE: Multidetector CT imaging of the abdomen and pelvis was performed using the standard protocol following bolus administration of intravenous contrast. RADIATION DOSE REDUCTION: This exam was performed according to the departmental dose-optimization program which includes automated exposure control, adjustment of the mA and/or kV according to patient size and/or use of iterative reconstruction technique. CONTRAST:  75mL OMNIPAQUE IOHEXOL 350 MG/ML SOLN COMPARISON:  CT of the abdomen and pelvis 03/20/2016. FINDINGS: Lower chest: Small left pleural effusion with some passive subsegmental atelectasis in the left lower lobe. Hepatobiliary: No suspicious cystic or solid hepatic lesions. No intra or extrahepatic biliary ductal dilatation. There is a waist like narrowing of the mid gallbladder where there is some enhancing soft tissue, best appreciated on coronal image 53 of series 6. Beyond this region there are numerous calcified and noncalcified gallstones filling the lumen of the gallbladder to the level of the fundus. Gallbladder wall appears mildly thickened and edematous throughout that region, however, this appears to be chronic as similar findings were evident on remote prior study from 2017. No pericholecystic fluid or surrounding inflammatory changes are noted. More proximal aspect of the gallbladder leading to the cystic duct is otherwise unremarkable in appearance. No intra or extrahepatic biliary ductal dilatation. Pancreas: No pancreatic mass. No pancreatic ductal  dilatation. No pancreatic or peripancreatic fluid collections or inflammatory changes. Spleen: Unremarkable. Adrenals/Urinary Tract: Subcentimeter  low-attenuation lesion in the left kidney, too small to definitively characterize, but statistically likely a tiny cyst (no imaging follow-up recommended). Right kidney and bilateral adrenal glands are normal in appearance. No hydroureteronephrosis. 8 mm calculus in the right-side of the urinary bladder posteriorly adjacent to the right ureterovesicular junction. Urinary bladder is otherwise unremarkable in appearance. Stomach/Bowel: The appearance of the stomach is normal. No pathologic dilatation of small bowel or colon. Normal appendix. Vascular/Lymphatic: Atherosclerosis in the abdominal aorta and pelvic vasculature. No evidence of aneurysm or dissection in the abdominal or pelvic vasculature. No lymphadenopathy noted in the abdomen or pelvis. Reproductive: Prostate gland and seminal vesicles are unremarkable in appearance. Other: No significant volume of ascites.  No pneumoperitoneum. 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 There are no aggressive appearing lytic or blastic lesions noted in the visualized portions of the skeleton. IMPRESSION: 1. No definite acute findings are noted in the abdomen or pelvis to account for the patient's symptoms. 2. Small left pleural effusion. 3. Cholelithiasis with unusual appearance of the gallbladder which is similar to remote prior study from 2017, without definitive imaging findings to suggest an acute cholecystitis at this time. 4. Nonobstructive calculus in the right-side of the urinary bladder measuring 8 mm adjacent to the right ureterovesicular junction. No definite ureteral stone or findings to suggest urinary tract obstruction noted at this time. 5. Aortic atherosclerosis. 6. Additional incidental findings, as above. Electronically Signed   By: Trudie Reed M.D.   On: 06/20/2023 09:33    Anti-infectives: Anti-infectives (From admission, onward)    Start     Dose/Rate Route  Frequency Ordered Stop   06/20/23 1400  piperacillin-tazobactam (ZOSYN) IVPB 3.375 g        3.375 g 12.5 mL/hr over 240 Minutes Intravenous Every 8 hours 06/20/23 1358     06/20/23 1230  cefTRIAXone (ROCEPHIN) 2 g in sodium chloride 0.9 % 100 mL IVPB        2 g 200 mL/hr over 30 Minutes Intravenous  Once 06/20/23 1225 06/20/23 1325       Assessment/Plan: ACUTE CHOLECYSTITIS PLAN LAP CHOLE TUESDAY DUE TO OR SCHEDULE  ALLOW DIET TODAY PLAN LGB TUESDAY   LOS: 1 day    Keosha Rossa A Eliberto Sole 06/21/2023 MODERATE

## 2023-06-21 NOTE — Progress Notes (Signed)
Subjective/Chief Complaint: Pt feels ok min abdominal pain   Objective: Vital signs in last 24 hours: Temp:  [98.1 F (36.7 C)-98.4 F (36.9 C)] 98.4 F (36.9 C) (08/26 0400) Pulse Rate:  [53-65] 55 (08/26 0400) Resp:  [16-19] 18 (08/26 0400) BP: (106-127)/(50-74) 115/74 (08/26 0400) SpO2:  [96 %-100 %] 98 % (08/26 0400) Last BM Date : 06/20/23  Intake/Output from previous day: 08/25 0701 - 08/26 0700 In: 354.8 [P.O.:240; I.V.:23; IV Piggyback:91.8] Out: -  Intake/Output this shift: No intake/output data recorded.  GI: mild TTP RUQ   Lab Results:  Recent Labs    06/20/23 0728 06/21/23 0422  WBC 7.4 5.6  HGB 12.3* 11.0*  HCT 38.4* 33.1*  PLT 318 260   BMET Recent Labs    06/20/23 0728 06/21/23 0422  NA 135 138  K 4.0 4.2  CL 103 103  CO2 24 26  GLUCOSE 143* 111*  BUN 15 9  CREATININE 0.98 1.08  CALCIUM 8.9 8.8*   PT/INR No results for input(s): "LABPROT", "INR" in the last 72 hours. ABG No results for input(s): "PHART", "HCO3" in the last 72 hours.  Invalid input(s): "PCO2", "PO2"  Studies/Results: US Abdomen Limited RUQ (LIVER/GB)  Result Date: 06/20/2023 CLINICAL DATA:  Right upper quadrant abdominal pain. EXAM: ULTRASOUND ABDOMEN LIMITED RIGHT UPPER QUADRANT COMPARISON:  CT of the abdomen and pelvis with contrast 06/20/2023 FINDINGS: Gallbladder: Multiple small stones are again noted at the neck of the gallbladder. Gallbladder is now somewhat contracted. This likely exaggerates wall thickening. The wall measures 4 mm. A sonographic Eulah Pont sign is reported. Common bile duct: Diameter: 3 mm, within normal limits Liver: The liver is mildly echogenic. No discrete lesions are present. Portal vein is patent on color Doppler imaging with normal direction of blood flow towards the liver. Other: None. IMPRESSION: 1. Cholelithiasis with mild gallbladder wall thickening and a sonographic Murphy sign. Findings are concerning for acute cholecystitis. 2. Mild  hepatic steatosis. Electronically Signed   By: Marin Roberts M.D.   On: 06/20/2023 11:12   CT ABDOMEN PELVIS W CONTRAST  Result Date: 06/20/2023 CLINICAL DATA:  72 year old male with history of acute onset of nonlocalized abdominal pain. EXAM: CT ABDOMEN AND PELVIS WITH CONTRAST TECHNIQUE: Multidetector CT imaging of the abdomen and pelvis was performed using the standard protocol following bolus administration of intravenous contrast. RADIATION DOSE REDUCTION: This exam was performed according to the departmental dose-optimization program which includes automated exposure control, adjustment of the mA and/or kV according to patient size and/or use of iterative reconstruction technique. CONTRAST:  75mL OMNIPAQUE IOHEXOL 350 MG/ML SOLN COMPARISON:  CT of the abdomen and pelvis 03/20/2016. FINDINGS: Lower chest: Small left pleural effusion with some passive subsegmental atelectasis in the left lower lobe. Hepatobiliary: No suspicious cystic or solid hepatic lesions. No intra or extrahepatic biliary ductal dilatation. There is a waist like narrowing of the mid gallbladder where there is some enhancing soft tissue, best appreciated on coronal image 53 of series 6. Beyond this region there are numerous calcified and noncalcified gallstones filling the lumen of the gallbladder to the level of the fundus. Gallbladder wall appears mildly thickened and edematous throughout that region, however, this appears to be chronic as similar findings were evident on remote prior study from 2017. No pericholecystic fluid or surrounding inflammatory changes are noted. More proximal aspect of the gallbladder leading to the cystic duct is otherwise unremarkable in appearance. No intra or extrahepatic biliary ductal dilatation. Pancreas: No pancreatic mass. No pancreatic ductal  dilatation. No pancreatic or peripancreatic fluid collections or inflammatory changes. Spleen: Unremarkable. Adrenals/Urinary Tract: Subcentimeter  low-attenuation lesion in the left kidney, too small to definitively characterize, but statistically likely a tiny cyst (no imaging follow-up recommended). Right kidney and bilateral adrenal glands are normal in appearance. No hydroureteronephrosis. 8 mm calculus in the right-side of the urinary bladder posteriorly adjacent to the right ureterovesicular junction. Urinary bladder is otherwise unremarkable in appearance. Stomach/Bowel: The appearance of the stomach is normal. No pathologic dilatation of small bowel or colon. Normal appendix. Vascular/Lymphatic: Atherosclerosis in the abdominal aorta and pelvic vasculature. No evidence of aneurysm or dissection in the abdominal or pelvic vasculature. No lymphadenopathy noted in the abdomen or pelvis. Reproductive: Prostate gland and seminal vesicles are unremarkable in appearance. Other: No significant volume of ascites.  No pneumoperitoneum. 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 There are no aggressive appearing lytic or blastic lesions noted in the visualized portions of the skeleton. IMPRESSION: 1. No definite acute findings are noted in the abdomen or pelvis to account for the patient's symptoms. 2. Small left pleural effusion. 3. Cholelithiasis with unusual appearance of the gallbladder which is similar to remote prior study from 2017, without definitive imaging findings to suggest an acute cholecystitis at this time. 4. Nonobstructive calculus in the right-side of the urinary bladder measuring 8 mm adjacent to the right ureterovesicular junction. No definite ureteral stone or findings to suggest urinary tract obstruction noted at this time. 5. Aortic atherosclerosis. 6. Additional incidental findings, as above. Electronically Signed   By: Trudie Reed M.D.   On: 06/20/2023 09:33    Anti-infectives: Anti-infectives (From admission, onward)    Start     Dose/Rate Route  Frequency Ordered Stop   06/20/23 1400  piperacillin-tazobactam (ZOSYN) IVPB 3.375 g        3.375 g 12.5 mL/hr over 240 Minutes Intravenous Every 8 hours 06/20/23 1358     06/20/23 1230  cefTRIAXone (ROCEPHIN) 2 g in sodium chloride 0.9 % 100 mL IVPB        2 g 200 mL/hr over 30 Minutes Intravenous  Once 06/20/23 1225 06/20/23 1325       Assessment/Plan: ACUTE CHOLECYSTITIS PLAN LAP CHOLE TUESDAY DUE TO OR SCHEDULE  ALLOW DIET TODAY PLAN LGB TUESDAY   LOS: 1 day    Keosha Rossa A Eliberto Sole 06/21/2023 MODERATE

## 2023-06-22 ENCOUNTER — Encounter (HOSPITAL_COMMUNITY)
Admission: EM | Disposition: A | Discharge status: Home / Self Care | Payer: Self-pay | Source: Home / Self Care | Attending: Emergency Medicine

## 2023-06-22 ENCOUNTER — Observation Stay (HOSPITAL_COMMUNITY): Payer: 59 | Admitting: Anesthesiology

## 2023-06-22 ENCOUNTER — Observation Stay (HOSPITAL_BASED_OUTPATIENT_CLINIC_OR_DEPARTMENT_OTHER): Payer: 59 | Admitting: Anesthesiology

## 2023-06-22 ENCOUNTER — Encounter (HOSPITAL_COMMUNITY): Payer: Self-pay | Admitting: Family Medicine

## 2023-06-22 ENCOUNTER — Other Ambulatory Visit: Payer: Self-pay

## 2023-06-22 DIAGNOSIS — K8000 Calculus of gallbladder with acute cholecystitis without obstruction: Secondary | ICD-10-CM

## 2023-06-22 DIAGNOSIS — C090 Malignant neoplasm of tonsillar fossa: Secondary | ICD-10-CM | POA: Diagnosis not present

## 2023-06-22 DIAGNOSIS — E039 Hypothyroidism, unspecified: Secondary | ICD-10-CM | POA: Diagnosis not present

## 2023-06-22 DIAGNOSIS — K810 Acute cholecystitis: Secondary | ICD-10-CM | POA: Diagnosis not present

## 2023-06-22 HISTORY — PX: CHOLECYSTECTOMY: SHX55

## 2023-06-22 LAB — SURGICAL PCR SCREEN
MRSA, PCR: NEGATIVE
Staphylococcus aureus: NEGATIVE

## 2023-06-22 SURGERY — LAPAROSCOPIC CHOLECYSTECTOMY WITH INTRAOPERATIVE CHOLANGIOGRAM
Anesthesia: General | Site: Abdomen

## 2023-06-22 MED ORDER — FENTANYL CITRATE (PF) 100 MCG/2ML IJ SOLN
25.0000 ug | INTRAMUSCULAR | Status: DC | PRN
  Administered 2023-06-22: 50 ug via INTRAVENOUS

## 2023-06-22 MED ORDER — PHENYLEPHRINE 80 MCG/ML (10ML) SYRINGE FOR IV PUSH (FOR BLOOD PRESSURE SUPPORT)
PREFILLED_SYRINGE | INTRAVENOUS | Status: DC | PRN
  Administered 2023-06-22: 80 ug via INTRAVENOUS
  Administered 2023-06-22: 160 ug via INTRAVENOUS

## 2023-06-22 MED ORDER — PROPOFOL 10 MG/ML IV BOLUS
INTRAVENOUS | Status: AC
  Filled 2023-06-22: qty 20

## 2023-06-22 MED ORDER — ONDANSETRON HCL 4 MG/2ML IJ SOLN
INTRAMUSCULAR | Status: AC
  Filled 2023-06-22: qty 2

## 2023-06-22 MED ORDER — LIDOCAINE 2% (20 MG/ML) 5 ML SYRINGE
INTRAMUSCULAR | Status: AC
  Filled 2023-06-22: qty 5

## 2023-06-22 MED ORDER — PROPOFOL 10 MG/ML IV BOLUS
INTRAVENOUS | Status: DC | PRN
Start: 2023-06-22 — End: 2023-06-22
  Administered 2023-06-22: 150 mg via INTRAVENOUS
  Administered 2023-06-22: 20 mg via INTRAVENOUS

## 2023-06-22 MED ORDER — MORPHINE SULFATE (PF) 2 MG/ML IV SOLN
2.0000 mg | INTRAVENOUS | Status: DC | PRN
  Administered 2023-06-22: 2 mg via INTRAVENOUS
  Filled 2023-06-22: qty 1

## 2023-06-22 MED ORDER — ONDANSETRON HCL 4 MG/2ML IJ SOLN
INTRAMUSCULAR | Status: DC | PRN
  Administered 2023-06-22: 4 mg via INTRAVENOUS

## 2023-06-22 MED ORDER — HYDROMORPHONE HCL 1 MG/ML IJ SOLN: 1.0000 mg | INTRAMUSCULAR | Status: DC | PRN

## 2023-06-22 MED ORDER — ROCURONIUM BROMIDE 10 MG/ML (PF) SYRINGE
PREFILLED_SYRINGE | INTRAVENOUS | Status: DC | PRN
  Administered 2023-06-22: 10 mg via INTRAVENOUS
  Administered 2023-06-22: 40 mg via INTRAVENOUS
  Administered 2023-06-22: 5 mg via INTRAVENOUS

## 2023-06-22 MED ORDER — ACETAMINOPHEN 650 MG RE SUPP: 650.0000 mg | Freq: Four times a day (QID) | RECTAL | Status: DC

## 2023-06-22 MED ORDER — ONDANSETRON 4 MG PO TBDP: 4.0000 mg | ORAL_TABLET | Freq: Four times a day (QID) | ORAL | Status: DC | PRN

## 2023-06-22 MED ORDER — DEXAMETHASONE SODIUM PHOSPHATE 10 MG/ML IJ SOLN
INTRAMUSCULAR | Status: DC | PRN
  Administered 2023-06-22: 10 mg via INTRAVENOUS

## 2023-06-22 MED ORDER — PHENYLEPHRINE HCL-NACL 20-0.9 MG/250ML-% IV SOLN
INTRAVENOUS | Status: DC | PRN
  Administered 2023-06-22: 30 ug/min via INTRAVENOUS

## 2023-06-22 MED ORDER — FENTANYL CITRATE (PF) 100 MCG/2ML IJ SOLN
INTRAMUSCULAR | Status: AC
  Filled 2023-06-22: qty 2

## 2023-06-22 MED ORDER — 0.9 % SODIUM CHLORIDE (POUR BTL) OPTIME
TOPICAL | Status: DC | PRN
  Administered 2023-06-22: 1000 mL

## 2023-06-22 MED ORDER — LIDOCAINE 2% (20 MG/ML) 5 ML SYRINGE
INTRAMUSCULAR | Status: DC | PRN
  Administered 2023-06-22: 80 mg via INTRAVENOUS

## 2023-06-22 MED ORDER — BUPIVACAINE-EPINEPHRINE 0.25% -1:200000 IJ SOLN
INTRAMUSCULAR | Status: DC | PRN
  Administered 2023-06-22: 8 mL

## 2023-06-22 MED ORDER — ACETAMINOPHEN 10 MG/ML IV SOLN
INTRAVENOUS | Status: DC | PRN
  Administered 2023-06-22: 1000 mg via INTRAVENOUS

## 2023-06-22 MED ORDER — INDOCYANINE GREEN 25 MG IV SOLR
2.5000 mg | Freq: Once | INTRAVENOUS | Status: AC
  Administered 2023-06-22: 2.5 mg via INTRAVENOUS
  Filled 2023-06-22: qty 10

## 2023-06-22 MED ORDER — AMISULPRIDE (ANTIEMETIC) 5 MG/2ML IV SOLN: 10.0000 mg | Freq: Once | INTRAVENOUS | Status: DC | PRN

## 2023-06-22 MED ORDER — HEMOSTATIC AGENTS (NO CHARGE) OPTIME
TOPICAL | Status: DC | PRN
  Administered 2023-06-22: 1 via TOPICAL

## 2023-06-22 MED ORDER — PHENYLEPHRINE 80 MCG/ML (10ML) SYRINGE FOR IV PUSH (FOR BLOOD PRESSURE SUPPORT)
PREFILLED_SYRINGE | INTRAVENOUS | Status: AC
  Filled 2023-06-22: qty 10

## 2023-06-22 MED ORDER — ROCURONIUM BROMIDE 10 MG/ML (PF) SYRINGE
PREFILLED_SYRINGE | INTRAVENOUS | Status: AC
  Filled 2023-06-22: qty 10

## 2023-06-22 MED ORDER — LACTATED RINGERS IV SOLN: INTRAVENOUS | Status: DC | PRN

## 2023-06-22 MED ORDER — FENTANYL CITRATE (PF) 250 MCG/5ML IJ SOLN
INTRAMUSCULAR | Status: AC
  Filled 2023-06-22: qty 5

## 2023-06-22 MED ORDER — FENTANYL CITRATE (PF) 250 MCG/5ML IJ SOLN
INTRAMUSCULAR | Status: DC | PRN
  Administered 2023-06-22: 100 ug via INTRAVENOUS
  Administered 2023-06-22: 25 ug via INTRAVENOUS
  Administered 2023-06-22: 50 ug via INTRAVENOUS
  Administered 2023-06-22 (×3): 25 ug via INTRAVENOUS

## 2023-06-22 MED ORDER — OXYCODONE HCL 5 MG PO TABS
5.0000 mg | ORAL_TABLET | ORAL | Status: DC | PRN
  Administered 2023-06-22: 10 mg via ORAL
  Administered 2023-06-22: 5 mg via ORAL
  Administered 2023-06-23 (×2): 10 mg via ORAL
  Administered 2023-06-23: 5 mg via ORAL
  Filled 2023-06-22: qty 2
  Filled 2023-06-22: qty 1
  Filled 2023-06-22: qty 2
  Filled 2023-06-22: qty 1
  Filled 2023-06-22: qty 2

## 2023-06-22 MED ORDER — ACETAMINOPHEN 500 MG PO TABS
1000.0000 mg | ORAL_TABLET | Freq: Four times a day (QID) | ORAL | Status: DC
  Administered 2023-06-22 – 2023-06-23 (×4): 1000 mg via ORAL
  Filled 2023-06-22 (×5): qty 2

## 2023-06-22 MED ORDER — INDOCYANINE GREEN 25 MG IV SOLR
25.0000 mg | Freq: Once | INTRAVENOUS | Status: DC
  Filled 2023-06-22: qty 10

## 2023-06-22 MED ORDER — DEXAMETHASONE SODIUM PHOSPHATE 10 MG/ML IJ SOLN
INTRAMUSCULAR | Status: AC
  Filled 2023-06-22: qty 1

## 2023-06-22 MED ORDER — ACETAMINOPHEN 10 MG/ML IV SOLN
INTRAVENOUS | Status: AC
  Filled 2023-06-22: qty 100

## 2023-06-22 MED ORDER — SUGAMMADEX SODIUM 200 MG/2ML IV SOLN
INTRAVENOUS | Status: DC | PRN
  Administered 2023-06-22: 200 mg via INTRAVENOUS

## 2023-06-22 MED ORDER — SODIUM CHLORIDE 0.9 % IR SOLN
Status: DC | PRN
  Administered 2023-06-22: 1000 mL

## 2023-06-22 MED ORDER — MIDAZOLAM HCL 2 MG/2ML IJ SOLN
INTRAMUSCULAR | Status: AC
  Filled 2023-06-22: qty 2

## 2023-06-22 MED ORDER — SODIUM CHLORIDE 0.9 % IV SOLN: INTRAVENOUS | Status: DC

## 2023-06-22 MED ORDER — BUPIVACAINE-EPINEPHRINE (PF) 0.25% -1:200000 IJ SOLN
INTRAMUSCULAR | Status: AC
  Filled 2023-06-22: qty 30

## 2023-06-22 SURGICAL SUPPLY — 48 items
ADH SKN CLS APL DERMABOND .7 (GAUZE/BANDAGES/DRESSINGS) ×1
APL PRP STRL LF DISP 70% ISPRP (MISCELLANEOUS) ×1
APL SRG 38 LTWT LNG FL B (MISCELLANEOUS) ×1
APPLICATOR ARISTA FLEXITIP XL (MISCELLANEOUS) IMPLANT
APPLIER CLIP ROT 10 11.4 M/L (STAPLE) ×1
APR CLP MED LRG 11.4X10 (STAPLE) ×1
BAG COUNTER SPONGE SURGICOUNT (BAG) ×1 IMPLANT
BAG SPEC RTRVL 10 TROC 200 (ENDOMECHANICALS) ×1
BAG SPNG CNTER NS LX DISP (BAG)
BLADE CLIPPER SURG (BLADE) IMPLANT
CANISTER SUCT 3000ML PPV (MISCELLANEOUS) ×1 IMPLANT
CHLORAPREP W/TINT 26 (MISCELLANEOUS) ×1 IMPLANT
CLIP APPLIE ROT 10 11.4 M/L (STAPLE) ×1 IMPLANT
COVER MAYO STAND STRL (DRAPES) ×1 IMPLANT
COVER SURGICAL LIGHT HANDLE (MISCELLANEOUS) ×1 IMPLANT
DERMABOND ADVANCED .7 DNX12 (GAUZE/BANDAGES/DRESSINGS) ×1 IMPLANT
DRAPE C-ARM 42X120 X-RAY (DRAPES) ×1 IMPLANT
ELECT REM PT RETURN 9FT ADLT (ELECTROSURGICAL) ×1
ELECTRODE REM PT RTRN 9FT ADLT (ELECTROSURGICAL) ×1 IMPLANT
GLOVE BIO SURGEON STRL SZ8 (GLOVE) ×1 IMPLANT
GLOVE BIOGEL PI IND STRL 8 (GLOVE) ×1 IMPLANT
GOWN STRL REUS W/ TWL LRG LVL3 (GOWN DISPOSABLE) ×2 IMPLANT
GOWN STRL REUS W/ TWL XL LVL3 (GOWN DISPOSABLE) ×1 IMPLANT
GOWN STRL REUS W/TWL LRG LVL3 (GOWN DISPOSABLE) ×2
GOWN STRL REUS W/TWL XL LVL3 (GOWN DISPOSABLE) ×1
HEMOSTAT ARISTA ABSORB 3G PWDR (HEMOSTASIS) IMPLANT
IRRIG SUCT STRYKERFLOW 2 WTIP (MISCELLANEOUS) ×1
IRRIGATION SUCT STRKRFLW 2 WTP (MISCELLANEOUS) ×1 IMPLANT
KIT BASIN OR (CUSTOM PROCEDURE TRAY) ×1 IMPLANT
KIT TURNOVER KIT B (KITS) ×1 IMPLANT
NS IRRIG 1000ML POUR BTL (IV SOLUTION) ×1 IMPLANT
PAD ARMBOARD 7.5X6 YLW CONV (MISCELLANEOUS) ×1 IMPLANT
POUCH RETRIEVAL ECOSAC 10 (ENDOMECHANICALS) ×1 IMPLANT
SCISSORS LAP 5X35 DISP (ENDOMECHANICALS) ×1 IMPLANT
SET CHOLANGIOGRAPH 5 50 .035 (SET/KITS/TRAYS/PACK) ×1 IMPLANT
SET TUBE SMOKE EVAC HIGH FLOW (TUBING) ×1 IMPLANT
SLEEVE Z-THREAD 5X100MM (TROCAR) ×1 IMPLANT
SPECIMEN JAR SMALL (MISCELLANEOUS) ×1 IMPLANT
SUT MNCRL AB 4-0 PS2 18 (SUTURE) ×1 IMPLANT
SUT VICRYL 0 UR6 27IN ABS (SUTURE) IMPLANT
TOWEL GREEN STERILE (TOWEL DISPOSABLE) ×1 IMPLANT
TOWEL GREEN STERILE FF (TOWEL DISPOSABLE) ×1 IMPLANT
TRAY LAPAROSCOPIC MC (CUSTOM PROCEDURE TRAY) ×1 IMPLANT
TROCAR 11X100 Z THREAD (TROCAR) ×1 IMPLANT
TROCAR BALLN 12MMX100 BLUNT (TROCAR) ×1 IMPLANT
TROCAR Z-THREAD OPTICAL 5X100M (TROCAR) ×1 IMPLANT
WARMER LAPAROSCOPE (MISCELLANEOUS) ×1 IMPLANT
WATER STERILE IRR 1000ML POUR (IV SOLUTION) ×1 IMPLANT

## 2023-06-22 NOTE — Transfer of Care (Signed)
Immediate Anesthesia Transfer of Care Note  Patient: Gregory Jenkins  Procedure(s) Performed: LAPAROSCOPIC CHOLECYSTECTOMY WITH ICG DYE (Abdomen)  Patient Location: PACU  Anesthesia Type:General  Level of Consciousness: awake, alert , and oriented  Airway & Oxygen Therapy: Patient Spontanous Breathing and Patient connected to nasal cannula oxygen  Post-op Assessment: Report given to RN, Post -op Vital signs reviewed and stable, and Patient moving all extremities X 4  Post vital signs: Reviewed and stable  Last Vitals:  Vitals Value Taken Time  BP 122/65 06/22/23 1145  Temp 36.7 C 06/22/23 1139  Pulse 64 06/22/23 1146  Resp 16 06/22/23 1146  SpO2 96 % 06/22/23 1146  Vitals shown include unfiled device data.  Last Pain:  Vitals:   06/22/23 1139  TempSrc:   PainSc: 3          Complications:  Encounter Notable Events  Notable Event Outcome Phase Comment  Difficult to intubate - expected  Intraprocedure Filed from anesthesia note documentation.

## 2023-06-22 NOTE — Progress Notes (Signed)
[facility]                                                                               Gregory Jenkins, is a 72 y.o. male, DOB - Mar 28, 1951, ZOX:[id number] Admit date - 06/20/2023    Outpatient Primary MD for the patient is Soundra Pilon, FNP  LOS - 1  days    Brief summary   72 y.o. 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 Lenell Antu on 05/31/23, DVT left subclavian 5/24 on eliquis who presented to ED with sudden onset of abdominal pain and nausea.   RUQ [place]:  Cholelithiasis with mild gallbladder wall thickening and a sonographic Murphy sign. Findings are concerning for acute cholecystitis.  General surgery consulted and he underwent lap chole today. Possible d/c home in am.   Assessment & Plan    Assessment and Plan: * Acute cholecystitis 72 year old male presenting with acute on set of abdominal pain early this AM that radiated to his back with associated nausea found to have cholelithiasis with mild gallbladder wall thickening and positive murphy sign concerning for acute cholecystitis.  No leukocytosis or transaminitis. Gen surgery on board and  underwent lap cholecystectomy.  Restart Eliquis after cleared by surgery   Acute deep vein thrombosis (DVT) of left upper extremity (HCC) Diagnosed 02/2023, followed by vascular surgery General surgery touched base with vascular. Holding eliquis, no heparin.    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    Mild anemia of chronic disease Hemoglobin around 11. Monitor.     Estimated body mass index is 23.06 kg/m as calculated  from the following:   Height as of this encounter: 6' (1.829 m).   Weight as of this encounter: 77.1 kg.  Code Status: full code.  DVT Prophylaxis:  Place TED hose Start: 06/20/23 1423   Level of Care: Level of care: Med-Surg Family Communication: family at bedside  Disposition Plan:     Remains inpatient appropriate:  discharge home in am.   Procedures:  LAP CHOLE   Consultants:   General surgery.   Antimicrobials:   Anti-infectives (From admission, onward)    Start     Dose/Rate Route Frequency Ordered Stop   06/20/23 1400  piperacillin-tazobactam (ZOSYN) IVPB 3.375 g  Status:  Discontinued        3.375 g 12.5 mL/hr over 240 Minutes Intravenous Every 8 hours 06/20/23 1358 06/22/23 1203   06/20/23 1230  cefTRIAXone (ROCEPHIN) 2 g in sodium chloride 0.9 % 100 mL IVPB        2 g 200 mL/hr over 30 Minutes Intravenous  Once 06/20/23 1225 06/20/23 1325        Medications  Scheduled Meds:  acetaminophen  1,000 mg Oral Q6H   Or   acetaminophen  650 mg Rectal Q6H   aspirin EC  81 mg Oral Daily   atorvastatin  10 mg Oral Daily   fentaNYL       levothyroxine  125 mcg Oral QAC breakfast   Continuous Infusions:  sodium chloride 50 mL/hr at 06/22/23 1611   PRN Meds:.fentaNYL, HYDROmorphone (DILAUDID) injection, ondansetron (ZOFRAN) IV, ondansetron, oxyCODONE    Subjective:   Gregory Jenkins was seen and examined today.  Abdominal pain. No nausea or vomiting.   Objective:   Vitals:   06/22/23 1154 06/22/23 1218 06/22/23 1350 06/22/23 1628  BP: 136/63 (!) 142/73 125/65 (!) 105/53  Pulse: 67 64 (!) 57 64  Resp: 19 17 20 17   Temp:  98.1 F (36.7 C) 97.8 F (36.6 C) 97.9 F (36.6 C)  TempSrc:  Oral Axillary Oral  SpO2: 94% 97% 94% 95%  Weight:      Height:        Intake/Output Summary (Last 24 hours) at 06/22/2023 1923 Last data filed at 06/22/2023 1611 Gross per 24 hour  Intake 2549.46 ml  Output --  Net 2549.46 ml   Filed Weights   06/20/23 0720  Weight:  77.1 kg     Exam General exam: Appears calm and comfortable  Respiratory system: Clear to auscultation. Respiratory effort normal. Cardiovascular system: S1 & S2 heard, RRR. No JVD,  Gastrointestinal system: Abdomen is soft, mildly tender in the abdomen.  Central nervous system: Alert and oriented. No focal neurological deficits. Extremities: Symmetric 5 x 5 power. Skin: No rashes,  Psychiatry:Mood & affect appropriate.    Data Reviewed:  I have personally reviewed following labs and imaging studies   CBC Lab Results  Component Value Date   WBC 5.6 06/21/2023   RBC 3.40 (L) 06/21/2023   HGB 11.0 (L) 06/21/2023   HCT 33.1 (L) 06/21/2023   MCV 97.4 06/21/2023   MCH 32.4 06/21/2023   PLT 260 06/21/2023   MCHC 33.2 06/21/2023   RDW 12.6 06/21/2023   LYMPHSABS 0.5 (L) 04/07/2019   MONOABS 0.3 04/07/2019   EOSABS 0.1 04/07/2019   BASOSABS 0.0 04/07/2019     Last metabolic panel Lab Results  Component Value Date   NA 138 06/21/2023   K 4.2 06/21/2023   CL 103 06/21/2023   CO2 26 06/21/2023   BUN 9 06/21/2023   CREATININE 1.08 06/21/2023   GLUCOSE 111 (H) 06/21/2023   GFRNONAA >60 06/21/2023   GFRAA >60 04/07/2019   CALCIUM 8.8 (L) 06/21/2023   PROT 5.8 (L) 06/21/2023   ALBUMIN 2.5 (L) 06/21/2023   BILITOT 0.5 06/21/2023   ALKPHOS 68 06/21/2023   AST 13 (L) 06/21/2023   ALT 12 06/21/2023   ANIONGAP 9 06/21/2023    CBG (last 3)  No results for input(s): "GLUCAP" in the last 72 hours.    Coagulation Profile: No results for input(s): "INR", "PROTIME" in the last 168 hours.   Radiology Studies: No results found.     Kathlen Mody M.D. [facility] 06/22/2023, 7:23 PM  Available via Epic secure chat 7am-7pm After 7 pm, please refer to night coverage provider listed on amion.

## 2023-06-22 NOTE — Op Note (Signed)
Laparoscopic Cholecystectomy with ICG dye Procedure Note  Indications: This patient presents with symptomatic gallbladder disease and will undergo laparoscopic cholecystectomy.The procedure has been discussed with the patient. Operative and non operative treatments have been discussed. Risks of surgery include bleeding, infection,  Common bile duct injury,  Injury to the stomach,liver, colon,small intestine, abdominal wall,  Diaphragm,  Major blood vessels,  And the need for an open procedure.  Other risks include worsening of medical problems, death,  DVT and pulmonary embolism, and cardiovascular events.   Medical options have also been discussed. The patient has been informed of long term expectations of surgery and non surgical options,  The patient agrees to proceed.     Pre-operative Diagnosis: Calculus of gallbladder with acute cholecystitis, without mention of obstruction  Post-operative Diagnosis: Same  Surgeon: Dortha Schwalbe  MD   Assistants: OR staff   Anesthesia: General endotracheal anesthesia and Local anesthesia 0.25.% bupivacaine  ASA Class: 2  Procedure Details  The patient was seen again in the Holding Room. The risks, benefits, complications, treatment options, and expected outcomes were discussed with the patient. The possibilities of reaction to medication, pulmonary aspiration, perforation of viscus, bleeding, recurrent infection, finding a normal gallbladder, the need for additional procedures, failure to diagnose a condition, the possible need to convert to an open procedure, and creating a complication requiring transfusion or operation were discussed with the patient. The patient and/or family concurred with the proposed plan, giving informed consent. The site of surgery properly noted/marked. The patient was taken to Operating Room, identified as KENRIC LINDQUIST and the procedure verified as Laparoscopic Cholecystectomy with Intraoperative Cholangiograms. A Time Out was  held and the above information confirmed.  Prior to the induction of general anesthesia, antibiotic prophylaxis was administered. General endotracheal anesthesia was then administered and tolerated well. After the induction, the abdomen was prepped in the usual sterile fashion. The patient was positioned in the supine position with the left arm comfortably tucked, along with some reverse Trendelenburg.  Local anesthetic agent was injected into the skin near the umbilicus and an incision made. The midline fascia was incised and the Hasson technique was used to introduce a 12 mm port under direct vision. It was secured with a figure of eight Vicryl suture placed in the usual fashion. Pneumoperitoneum was then created with CO2 and tolerated well without any adverse changes in the patient's vital signs. Additional trocars were introduced under direct vision with an 11 mm trocar in the epigastrium and 2 5 mm trocars in the right upper quadrant. All skin incisions were infiltrated with a local anesthetic agent before making the incision and placing the trocars.   The gallbladder was identified, the fundus grasped and retracted cephalad. Adhesions were lysed bluntly and with the electrocautery where indicated, taking care not to injure any adjacent organs or viscus. The infundibulum was grasped and retracted laterally, exposing the peritoneum overlying the triangle of Calot. This was then divided and exposed in a blunt fashion. The cystic duct was clearly identified and bluntly dissected circumferentially. The junctions of the gallbladder, cystic duct and common bile duct were clearly identified prior to the division of any linear structure.   ICG dye administered. Anatomy identified. The cystic duct was quite small and necrotic.  Cholangiogram not performed.  The critical view was obtained. .   The cystic duct was then  ligated with surgical clips  on the patient side and  clipped on the gallbladder side and  divided. The cystic artery  was identified, dissected free, ligated with clips and divided as well. Posterior cystic artery clipped and divided.  The gallbladder was dissected from the liver bed in retrograde fashion with the electrocautery. The gallbladder was removed and placed into an Ecosac bag. The liver bed was irrigated and inspected. Hemostasis was achieved with the electrocautery. Copious irrigation was utilized and was repeatedly aspirated until clear all particulate matter. Hemostasis was achieved with Arista and cautery with no signs  Of bleeding or bile leakage.   Pneumoperitoneum was completely reduced after viewing removal of the trocars under direct vision. The wound was thoroughly irrigated and the fascia was then closed with a figure of eight suture; the skin was then closed with 4 0 monocryl  and a sterile dressing of Dermabond  was applied.  Instrument, sponge, and needle counts were correct at closure and at the conclusion of the case.   Findings: Cholecystitis with Cholelithiasis  Estimated Blood Loss: Minimal         Drains: none          Total IV Fluids per OR record          Specimens: Gallbladder           Complications: None; patient tolerated the procedure well.         Disposition: PACU - hemodynamically stable.         Condition: stable

## 2023-06-22 NOTE — Anesthesia Preprocedure Evaluation (Signed)
Anesthesia Evaluation  Patient identified by MRN, date of birth, ID band Patient awake    Reviewed: Allergy & Precautions, NPO status , Patient's Chart, lab work & pertinent test results  History of Anesthesia Complications Negative for: history of anesthetic complications  Airway Mallampati: III  TM Distance: >3 FB Neck ROM: Full  Mouth opening: Limited Mouth Opening  Dental  (+) Teeth Intact, Dental Advisory Given   Pulmonary neg pulmonary ROS   breath sounds clear to auscultation       Cardiovascular negative cardio ROS  Rhythm:Regular     Neuro/Psych negative neurological ROS     GI/Hepatic negative GI ROS, Neg liver ROS,,,  Endo/Other  Hypothyroidism    Renal/GU negative Renal ROS     Musculoskeletal negative musculoskeletal ROS (+)    Abdominal   Peds  Hematology negative hematology ROS (+)   Anesthesia Other Findings S/p chemo/radiation for tonsillar ca  Reproductive/Obstetrics                             Anesthesia Physical Anesthesia Plan  ASA: 2  Anesthesia Plan: General   Post-op Pain Management: Ofirmev IV (intra-op)*   Induction: Intravenous  PONV Risk Score and Plan: 2 and Ondansetron and Dexamethasone  Airway Management Planned: Video Laryngoscope Planned and Oral ETT  Additional Equipment:   Intra-op Plan:   Post-operative Plan: Extubation in OR  Informed Consent: I have reviewed the patients History and Physical, chart, labs and discussed the procedure including the risks, benefits and alternatives for the proposed anesthesia with the patient or authorized representative who has indicated his/her understanding and acceptance.     Dental advisory given  Plan Discussed with: CRNA  Anesthesia Plan Comments:        Anesthesia Quick Evaluation

## 2023-06-22 NOTE — Interval H&P Note (Signed)
History and Physical Interval Note:  06/22/2023 8:44 AM  Gregory Jenkins  has presented today for surgery, with the diagnosis of Cholecystitis.  The various methods of treatment have been discussed with the patient and family. After consideration of risks, benefits and other options for treatment, the patient has consented to  Procedure(s): LAPAROSCOPIC CHOLECYSTECTOMY WITH  POSSIBLE INTRAOPERATIVE CHOLANGIOGRAM (N/A) as a surgical intervention.  The patient's history has been reviewed, patient examined, no change in status, stable for surgery.  I have reviewed the patient's chart and labs.  Questions were answered to the patient's satisfaction.     Maddoxx Burkitt A Saavi Mceachron

## 2023-06-22 NOTE — Discharge Instructions (Signed)

## 2023-06-22 NOTE — Anesthesia Procedure Notes (Signed)
Procedure Name: Intubation Date/Time: 06/22/2023 9:45 AM  Performed by: Marquis Buggy, CRNAPre-anesthesia Checklist: Patient identified, Emergency Drugs available, Suction available and Patient being monitored Patient Re-evaluated:Patient Re-evaluated prior to induction Oxygen Delivery Method: Circle system utilized Preoxygenation: Pre-oxygenation with 100% oxygen Induction Type: IV induction Ventilation: Two handed mask ventilation required and Mask ventilation with difficulty Laryngoscope Size: Glidescope and 3 Grade View: Grade II Tube type: Oral Tube size: 7.5 mm Number of attempts: 1 Airway Equipment and Method: Stylet Placement Confirmation: ETT inserted through vocal cords under direct vision, positive ETCO2 and breath sounds checked- equal and bilateral Secured at: 22 cm Tube secured with: Tape Dental Injury: Teeth and Oropharynx as per pre-operative assessment  Difficulty Due To: Difficulty was anticipated, Difficult Airway- due to anterior larynx, Difficult Airway- due to limited oral opening and Difficult Airway- due to reduced neck mobility Comments: Recommend glidescope or video laryngoscopy

## 2023-06-22 NOTE — Anesthesia Postprocedure Evaluation (Signed)
Anesthesia Post Note  Patient: Gregory Jenkins  Procedure(s) Performed: LAPAROSCOPIC CHOLECYSTECTOMY WITH ICG DYE (Abdomen)     Patient location during evaluation: PACU Anesthesia Type: General Level of consciousness: awake and alert Pain management: pain level controlled Vital Signs Assessment: post-procedure vital signs reviewed and stable Respiratory status: spontaneous breathing, nonlabored ventilation, respiratory function stable and patient connected to nasal cannula oxygen Cardiovascular status: blood pressure returned to baseline and stable Postop Assessment: no apparent nausea or vomiting Anesthetic complications: yes  Encounter Notable Events  Notable Event Outcome Phase Comment  Difficult to intubate - expected  Intraprocedure Filed from anesthesia note documentation.    Last Vitals:  Vitals:   06/22/23 1218 06/22/23 1350  BP: (!) 142/73 125/65  Pulse: 64 (!) 57  Resp: 17 20  Temp: 36.7 C 36.6 C  SpO2: 97% 94%    Last Pain:  Vitals:   06/22/23 1450  TempSrc:   PainSc: 5                  Kennieth Rad

## 2023-06-23 ENCOUNTER — Encounter (HOSPITAL_COMMUNITY): Payer: Self-pay | Admitting: Surgery

## 2023-06-23 DIAGNOSIS — K810 Acute cholecystitis: Secondary | ICD-10-CM | POA: Diagnosis not present

## 2023-06-23 LAB — SURGICAL PATHOLOGY

## 2023-06-23 MED ORDER — METHOCARBAMOL 500 MG PO TABS
500.0000 mg | ORAL_TABLET | Freq: Four times a day (QID) | ORAL | Status: DC
  Administered 2023-06-23 (×2): 500 mg via ORAL
  Filled 2023-06-23 (×2): qty 1

## 2023-06-23 MED ORDER — OXYCODONE HCL 5 MG PO TABS: 5.0000 mg | ORAL_TABLET | ORAL | 0 refills | Status: DC | PRN

## 2023-06-23 MED ORDER — APIXABAN 5 MG PO TABS
5.0000 mg | ORAL_TABLET | Freq: Two times a day (BID) | ORAL | Status: DC
  Administered 2023-06-23: 5 mg via ORAL
  Filled 2023-06-23: qty 1

## 2023-06-23 MED ORDER — METHOCARBAMOL 500 MG PO TABS: 500.0000 mg | ORAL_TABLET | Freq: Four times a day (QID) | ORAL | 0 refills | Status: DC | PRN

## 2023-06-23 MED ORDER — APIXABAN 5 MG PO TABS: 5.0000 mg | ORAL_TABLET | Freq: Two times a day (BID) | ORAL | 0 refills | Status: DC

## 2023-06-23 NOTE — Discharge Summary (Signed)
Physician Discharge Summary  Gregory Jenkins:[id number] DOB: 1951-01-26 DOA: 06/20/2023  PCP: Soundra Pilon, FNP  Admit date: 06/20/2023 Discharge date: 06/23/2023  Admitted From: home Disposition:  home  Recommendations for Outpatient Follow-up:  Follow up with PCP in 1-2 weeks Follow-up with general surgery as scheduled  Home Health: none Equipment/Devices: none  Discharge Condition: stable CODE STATUS: Full code Diet Orders (From admission, onward)     Start     Ordered   06/22/23 1204  Diet regular Fluid consistency: Thin  Diet effective now       Question:  Fluid consistency:  Answer:  Thin   06/22/23 1203            HPI: Per admitting MD, Gregory Jenkins is a 72 y.o. 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 Lenell Antu on 05/31/23, DVT left subclavian 5/24 on eliquis who presented to ED with sudden onset of abdominal pain and nausea that started around 2AM this morning. Pain epigastric area with radiation to the back rated as an 8/10 with associated nausea, no vomiting. He has had no fever/chills. He ate a hotdog, chili and some ice cream at the rodeo last night. Last dose of eliquis was at 5pm yesterday. He has been feeling good. Denies any fever/chills, vision changes/headaches, chest pain or palpitations, shortness of breath or cough, diarrhea, dysuria or leg swelling. He does not smoke or drink alcohol.   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 Workup  on admission showed acute cholecystitis. General surgery was consulted and was taken to OR on 8/27, now s/p lap cholecystectomy. Doing well postoperatively, pain is controlled, able to tolerate a regular diet and will be discharged home in stable condition with outpatient follow up with surgery.   Active problems Acute deep vein thrombosis (DVT) of left upper extremity (HCC) - Diagnosed 02/2023, followed by vascular surgery. 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 Followed routinely by ENT and oncology  Hypothyroidism- Resume synthroid.  Mild anemia of chronic disease - Hemoglobin stable  Sepsis ruled out   Discharge Instructions   Allergies as of 06/23/2023   No Known Allergies      Medication List     TAKE these medications    acetaminophen 500 MG tablet Commonly known as: TYLENOL Take 1,000 mg by mouth every 6 (six) hours as needed for moderate pain.   apixaban 5 MG Tabs tablet Commonly known as: ELIQUIS Take 1 tablet (5 mg total) by mouth 2 (two) times daily.   aspirin EC 81 MG tablet Take 81 mg by mouth daily.   atorvastatin 10 MG tablet Commonly known as: LIPITOR Take 10 mg by mouth daily.   cyanocobalamin 1000 MCG/ML injection Commonly known as: VITAMIN B12 Inject 1,000 mcg into the muscle every 30 (thirty) days.   ipratropium 0.06 % nasal spray Commonly known as: ATROVENT Place  2 sprays into both nostrils daily as needed for rhinitis.   levothyroxine 125 MCG tablet Commonly known as: SYNTHROID Take 125 mcg by mouth daily before breakfast.   methocarbamol 500 MG tablet Commonly known as: ROBAXIN Take 1 tablet (500 mg total) by mouth every 6 (six) hours as needed for muscle spasms.   oxyCODONE 5 MG immediate release tablet Commonly known as: Oxy IR/ROXICODONE Take 1 tablet (5 mg total) by mouth  every 4 (four) hours as needed for moderate pain. What changed: reasons to take this        Follow-up Information     Maczis, Hedda Slade, PA-C. Go on 07/13/2023.   Specialty: General Surgery Why: Your appointment is 9/17 at 11:45am Arrive early to check in, fill out paperwork, Bring photo ID and insurance information Contact information: [address] [place] [address] [place] [address] [phone]                 Consultations: General surgery   Procedures/Studies:  US Abdomen Limited RUQ (LIVER/GB)  Result Date: 06/20/2023 CLINICAL DATA:  Right upper quadrant abdominal pain. EXAM: ULTRASOUND ABDOMEN LIMITED RIGHT UPPER QUADRANT COMPARISON:  CT of the abdomen and pelvis with contrast 06/20/2023 FINDINGS: Gallbladder: Multiple small stones are again noted at the neck of the gallbladder. Gallbladder is now somewhat contracted. This likely exaggerates wall thickening. The wall measures 4 mm. A sonographic Eulah Pont sign is reported. Common bile duct: Diameter: 3 mm, within normal limits Liver: The liver is mildly echogenic. No discrete lesions are present. Portal vein is patent on color Doppler imaging with normal direction of blood flow towards the liver. Other: None. IMPRESSION: 1. Cholelithiasis with mild gallbladder wall thickening and a sonographic Murphy sign. Findings are concerning for acute cholecystitis. 2. Mild hepatic steatosis. Electronically Signed   By: Marin Roberts M.D.   On: 06/20/2023 11:12   CT ABDOMEN PELVIS W CONTRAST  Result Date: 06/20/2023 CLINICAL DATA:  72 year old male with history of acute onset of nonlocalized abdominal pain. EXAM: CT ABDOMEN AND PELVIS WITH CONTRAST TECHNIQUE: Multidetector CT imaging of the abdomen and pelvis was performed using the standard protocol following bolus administration of intravenous contrast. RADIATION DOSE REDUCTION: This exam was performed according to the departmental dose-optimization program which includes  automated exposure control, adjustment of the mA and/or kV according to patient size and/or use of iterative reconstruction technique. CONTRAST:  75mL OMNIPAQUE IOHEXOL 350 MG/ML SOLN COMPARISON:  CT of the abdomen and pelvis 03/20/2016. FINDINGS: Lower chest: Small left pleural effusion with some passive subsegmental atelectasis in the left lower lobe. Hepatobiliary: No suspicious cystic or solid hepatic lesions. No intra or extrahepatic biliary ductal dilatation. There is a waist like narrowing of the mid gallbladder where there is some enhancing soft tissue, best appreciated on coronal image 53 of series 6. Beyond this region there are numerous calcified and noncalcified gallstones filling the lumen of the gallbladder to the level of the fundus. Gallbladder wall appears mildly thickened and edematous throughout that region, however, this appears to be chronic as similar findings were evident on remote prior study from 2017. No pericholecystic fluid or surrounding inflammatory changes are noted. More proximal aspect of the gallbladder leading to the cystic duct is otherwise unremarkable in appearance. No intra or extrahepatic biliary ductal dilatation. Pancreas: No pancreatic mass. No pancreatic ductal dilatation. No pancreatic or peripancreatic fluid collections or inflammatory changes. Spleen: Unremarkable. Adrenals/Urinary Tract: Subcentimeter low-attenuation lesion in the left kidney, too small to definitively characterize, but statistically likely  a tiny cyst (no imaging follow-up recommended). Right kidney and bilateral adrenal glands are normal in appearance. No hydroureteronephrosis. 8 mm calculus in the right-side of the urinary bladder posteriorly adjacent to the right ureterovesicular junction. Urinary bladder is otherwise unremarkable in appearance. Stomach/Bowel: The appearance of the stomach is normal. No pathologic dilatation of small bowel or colon. Normal appendix. Vascular/Lymphatic:  Atherosclerosis in the abdominal aorta and pelvic vasculature. No evidence of aneurysm or dissection in the abdominal or pelvic vasculature. No lymphadenopathy noted in the abdomen or pelvis. Reproductive: Prostate gland and seminal vesicles are unremarkable in appearance. Other: No significant volume of ascites.  No pneumoperitoneum. 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 There are no aggressive appearing lytic or blastic lesions noted in the visualized portions of the skeleton. IMPRESSION: 1. No definite acute findings are noted in the abdomen or pelvis to account for the patient's symptoms. 2. Small left pleural effusion. 3. Cholelithiasis with unusual appearance of the gallbladder which is similar to remote prior study from 2017, without definitive imaging findings to suggest an acute cholecystitis at this time. 4. Nonobstructive calculus in the right-side of the urinary bladder measuring 8 mm adjacent to the right ureterovesicular junction. No definite ureteral stone or findings to suggest urinary tract obstruction noted at this time. 5. Aortic atherosclerosis. 6. Additional incidental findings, as above. Electronically Signed   By: Trudie Reed M.D.   On: 06/20/2023 09:33   DG CHEST PORT 1 VIEW  Result Date: 06/01/2023 CLINICAL DATA:  History of chest tube. EXAM: PORTABLE CHEST 1 VIEW COMPARISON:  06/01/2023 FINDINGS: Left chest tube has been removed. Vague densities in left upper lung but no definite pneumothorax. Hazy densities at the left lung base are nonspecific but could represent atelectasis. Right lung appears to be clear. Evidence for apical lung scarring. Heart size is normal. Trachea is midline. IMPRESSION: 1. Left chest tube has been removed. No definite pneumothorax. 2. Hazy densities in the left lung are nonspecific but could represent atelectasis. Electronically Signed   By: Richarda Overlie M.D.   On: 06/01/2023  14:37   DG CHEST PORT 1 VIEW  Result Date: 06/01/2023 CLINICAL DATA:  Thoracic outlet syndrome EXAM: PORTABLE CHEST 1 VIEW COMPARISON:  Chest x-ray dated May 31, 2023 FINDINGS: Cardiac and mediastinal contours within normal limits. Unchanged position of left-sided chest tube. Mild left retrocardiac opacity, likely due to atelectasis. No evidence of pleural effusion or pneumothorax. IMPRESSION: Unchanged position of left-sided chest tube. No evidence of pneumothorax. Electronically Signed   By: Allegra Lai M.D.   On: 06/01/2023 12:55   DG C-Arm 1-60 Min-No Report  Result Date: 05/31/2023 Fluoroscopy was utilized by the requesting physician.  No radiographic interpretation.   DG Chest 1 View  Result Date: 05/31/2023 CLINICAL DATA:  [id number] H/O resection of rib [id number] EXAM: CHEST  1 VIEW COMPARISON:  12/02/2016. FINDINGS: Low lung volume. Bilateral lung fields are clear. Bilateral lateral costophrenic angles are clear. Normal cardio-mediastinal silhouette. No acute osseous abnormalities. Note is made of partial resection of anterior left first rib. The soft tissues are within normal limits. Left-sided pleural drainage catheter noted. IMPRESSION: 1. Low lung volume. No active cardiopulmonary disease. Electronically Signed   By: Jules Schick M.D.   On: 05/31/2023 10:48     Subjective: - no chest pain, shortness of breath, no abdominal pain, nausea or vomiting.   Discharge Exam: BP 102/62 (BP Location: Right Arm)  Pulse 66   Temp 98.1 F (36.7 C) (Oral)   Resp 17   Ht 6' (1.829 m)   Wt 77.1 kg   SpO2 97%   BMI 23.06 kg/m   General: Pt is alert, awake, not in acute distress Cardiovascular: RRR, S1/S2 +, no rubs, no gallops Respiratory: CTA bilaterally, no wheezing, no rhonchi Abdominal: Soft, NT, ND, bowel sounds + Extremities: no edema, no cyanosis    The results of significant diagnostics from this hospitalization (including imaging, microbiology, ancillary and laboratory) are  listed below for reference.     Microbiology: Recent Results (from the past 240 hour(s))  Surgical pcr screen     Status: None   Collection Time: 06/22/23  4:04 AM   Specimen: Nasal Mucosa; Nasal Swab  Result Value Ref Range Status   MRSA, PCR NEGATIVE NEGATIVE Final   Staphylococcus aureus NEGATIVE NEGATIVE Final    Comment: (NOTE) The Xpert SA Assay (FDA approved for NASAL specimens in patients 18 years of age and older), is one component of a comprehensive surveillance program. It is not intended to diagnose infection nor to guide or monitor treatment. Performed at [facility], [address], [place], [place] [address]      Labs: Basic Metabolic Panel: Recent Labs  Lab 06/20/23 0728 06/21/23 0422  NA 135 138  K 4.0 4.2  CL 103 103  CO2 24 26  GLUCOSE 143* 111*  BUN 15 9  CREATININE 0.98 1.08  CALCIUM 8.9 8.8*   Liver Function Tests: Recent Labs  Lab 06/20/23 0728 06/21/23 0422  AST 17 13*  ALT 14 12  ALKPHOS 82 68  BILITOT 0.5 0.5  PROT 7.1 5.8*  ALBUMIN 3.3* 2.5*   CBC: Recent Labs  Lab 06/20/23 0728 06/21/23 0422  WBC 7.4 5.6  HGB 12.3* 11.0*  HCT 38.4* 33.1*  MCV 96.5 97.4  PLT 318 260   CBG: No results for input(s): "GLUCAP" in the last 168 hours. Hgb A1c No results for input(s): "HGBA1C" in the last 72 hours. Lipid Profile No results for input(s): "CHOL", "HDL", "LDLCALC", "TRIG", "CHOLHDL", "LDLDIRECT" in the last 72 hours. Thyroid function studies Recent Labs    06/20/23 1549  TSH 2.265   Urinalysis    Component Value Date/Time   COLORURINE YELLOW 06/20/2023 0723   APPEARANCEUR CLEAR 06/20/2023 0723   LABSPEC 1.019 06/20/2023 0723   LABSPEC 1.010 03/18/2016 1558   PHURINE 5.0 06/20/2023 0723   GLUCOSEU NEGATIVE 06/20/2023 0723   GLUCOSEU Negative 03/18/2016 1558   HGBUR NEGATIVE 06/20/2023 0723   BILIRUBINUR NEGATIVE 06/20/2023 0723   BILIRUBINUR Negative 03/18/2016 1558   KETONESUR NEGATIVE 06/20/2023 0723    PROTEINUR NEGATIVE 06/20/2023 0723   UROBILINOGEN 0.2 03/18/2016 1558   NITRITE NEGATIVE 06/20/2023 0723   LEUKOCYTESUR NEGATIVE 06/20/2023 0723   LEUKOCYTESUR Trace 03/18/2016 1558    FURTHER DISCHARGE INSTRUCTIONS:   Get Medicines reviewed and adjusted: Please take all your medications with you for your next visit with your Primary MD   Laboratory/radiological data: Please request your Primary MD to go over all hospital tests and procedure/radiological results at the follow up, please ask your Primary MD to get all Hospital records sent to his/her office.   In some cases, they will be blood work, cultures and biopsy results pending at the time of your discharge. Please request that your primary care M.D. goes through all the records of your hospital data and follows up on these results.   Also Note the following: If  you experience worsening of your admission symptoms, develop shortness of breath, life threatening emergency, suicidal or homicidal thoughts you must seek medical attention immediately by calling 911 or calling your MD immediately  if symptoms less severe.   You must read complete instructions/literature along with all the possible adverse reactions/side effects for all the Medicines you take and that have been prescribed to you. Take any new Medicines after you have completely understood and accpet all the possible adverse reactions/side effects.    Do not drive when taking Pain medications or sleeping medications (Benzodaizepines)   Do not take more than prescribed Pain, Sleep and Anxiety Medications. It is not advisable to combine anxiety,sleep and pain medications without talking with your primary care practitioner   Special Instructions: If you have smoked or chewed Tobacco  in the last 2 yrs please stop smoking, stop any regular Alcohol  and or any Recreational drug use.   Wear Seat belts while driving.   Please note: You were cared for by a hospitalist during your  hospital stay. Once you are discharged, your primary care physician will handle any further medical issues. Please note that NO REFILLS for any discharge medications will be authorized once you are discharged, as it is imperative that you return to your primary care physician (or establish a relationship with a primary care physician if you do not have one) for your post hospital discharge needs so that they can reassess your need for medications and monitor your lab values.  Time coordinating discharge: 35 minutes  SIGNED:  Pamella Pert, MD, PhD 06/23/2023, 1:36 PM

## 2023-06-23 NOTE — Progress Notes (Signed)
1 Day Post-Op  Subjective: Doing ok post op.  Having some pain especially with breathing and moving.  Oxy doing ok.  Ambulating some.  Eating well with no nausea or vomiting.  Objective: Vital signs in last 24 hours: Temp:  [97.8 F (36.6 C)-98.5 F (36.9 C)] 98.5 F (36.9 C) (08/28 0851) Pulse Rate:  [51-67] 55 (08/28 0851) Resp:  [15-20] 17 (08/28 0851) BP: (105-142)/(53-73) 113/57 (08/28 0851) SpO2:  [94 %-100 %] 95 % (08/28 0851) Last BM Date : 06/22/23  Intake/Output from previous day: 08/27 0701 - 08/28 0700 In: 2124.5 [P.O.:220; I.V.:1682.8; IV Piggyback:221.7] Out: -  Intake/Output this shift: Total I/O In: 480 [P.O.:480] Out: -   PE: Abd: soft, appropriately tender, +BS, ND, incisions c/d/i  Lab Results:  Recent Labs    06/21/23 0422  WBC 5.6  HGB 11.0*  HCT 33.1*  PLT 260   BMET Recent Labs    06/21/23 0422  NA 138  K 4.2  CL 103  CO2 26  GLUCOSE 111*  BUN 9  CREATININE 1.08  CALCIUM 8.8*   PT/INR No results for input(s): "LABPROT", "INR" in the last 72 hours. CMP     Component Value Date/Time   NA 138 06/21/2023 0422   NA 139 05/26/2016 1111   K 4.2 06/21/2023 0422   K 4.5 05/26/2016 1111   CL 103 06/21/2023 0422   CO2 26 06/21/2023 0422   CO2 28 05/26/2016 1111   GLUCOSE 111 (H) 06/21/2023 0422   GLUCOSE 97 05/26/2016 1111   BUN 9 06/21/2023 0422   BUN 18.8 10/01/2016 0956   CREATININE 1.08 06/21/2023 0422   CREATININE 1.0 10/01/2016 0956   CALCIUM 8.8 (L) 06/21/2023 0422   CALCIUM 9.9 05/26/2016 1111   PROT 5.8 (L) 06/21/2023 0422   PROT 7.2 05/26/2016 1111   ALBUMIN 2.5 (L) 06/21/2023 0422   ALBUMIN 3.7 05/26/2016 1111   AST 13 (L) 06/21/2023 0422   AST 16 05/26/2016 1111   ALT 12 06/21/2023 0422   ALT 15 05/26/2016 1111   ALKPHOS 68 06/21/2023 0422   ALKPHOS 81 05/26/2016 1111   BILITOT 0.5 06/21/2023 0422   BILITOT 0.74 05/26/2016 1111   GFRNONAA >60 06/21/2023 0422   GFRAA >60 04/07/2019 0859   Lipase      Component Value Date/Time   LIPASE 34 06/20/2023 0728       Studies/Results: No results found.  Anti-infectives: Anti-infectives (From admission, onward)    Start     Dose/Rate Route Frequency Ordered Stop   06/20/23 1400  piperacillin-tazobactam (ZOSYN) IVPB 3.375 g  Status:  Discontinued        3.375 g 12.5 mL/hr over 240 Minutes Intravenous Every 8 hours 06/20/23 1358 06/22/23 1203   06/20/23 1230  cefTRIAXone (ROCEPHIN) 2 g in sodium chloride 0.9 % 100 mL IVPB        2 g 200 mL/hr over 30 Minutes Intravenous  Once 06/20/23 1225 06/20/23 1325        Assessment/Plan POD 1, s/p lap chole by Dr. Luisa Hart for acute cholecystitis -doing well surgically.  Having some appropriate pain, but will add some robaxin to see if this helps some with the type of discomfort he is having. -ok to resume eliquis today -mobilize -regular diet -surgically stable to dc home later today vs tomorrow pending pain control   FEN - regular VTE - eliquis ID - no further needed    LOS: 1 day    Gregory Jenkins ,  [facility] Surgery 06/23/2023, 10:39 AM Please see Amion for pager number during day hours 7:00am-4:30pm or 7:00am -11:30am on weekends

## 2023-06-23 NOTE — Progress Notes (Signed)
ANTICOAGULATION CONSULT NOTE - Initial Consult  Pharmacy Consult for Apixaban Indication: history of DVT (02/2023)  No Known Allergies  Patient Measurements: Height: 6' (182.9 cm) Weight: 77.1 kg (170 lb) IBW/kg (Calculated) : 77.6   Vital Signs: Temp: 98.4 F (36.9 C) (08/28 0545) Temp Source: Oral (08/28 0545) BP: 134/63 (08/28 0545) Pulse Rate: 51 (08/28 0545)  Labs: Recent Labs    06/21/23 0422  HGB 11.0*  HCT 33.1*  PLT 260  CREATININE 1.08    Estimated Creatinine Clearance: 67.4 mL/min (by C-G formula based on SCr of 1.08 mg/dL).   Medical History: Past Medical History:  Diagnosis Date   Abdominal pain 03/19/2016   Allergic rhinitis    Cancer of tonsillar fossa (HCC)    left   Clotting disorder (HCC)    PE, suspect lupus anticoagulant   Drug-induced skin rash 12/25/2015   History of kidney stones 2000   History of nephrolithiasis    History of pulmonary embolism    History of radiation therapy 12/17/2015- 02/03/16   Left Tonsil and Bilateral Neck   Hypothyroidism    Mucositis oral 01/08/2016   Sleep apnea 2000   Thyroid disease    Tonsil cancer (HCC)     Medications:  Scheduled:   acetaminophen  1,000 mg Oral Q6H   Or   acetaminophen  650 mg Rectal Q6H   apixaban  5 mg Oral BID   aspirin EC  81 mg Oral Daily   atorvastatin  10 mg Oral Daily   levothyroxine  125 mcg Oral QAC breakfast    Assessment: 72 yo M admitted for acute cholecystitis on 8/25 s/p laparoscopic cholecystectomy on 8/27. Patient with history of DVT (diagnosed 02/2023) was on apixaban 5mg  po BID prior to admission, but was held on admission for impending surgery. Pharmacy consulted to resume apixaban for history of DVT on 06/23/23.   CBC not collected by phlebotomy No s/sx of bleeding noted post-operatively Pt tolerating PO meds  Goal of Therapy:  Monitor platelets by anticoagulation protocol: Yes   Plan:  Resume apixaban 5mg  PO BID at 10:00 today Monitor CBC and for  s/sx of bleeding   Wilburn Cornelia, PharmD, BCPS Clinical Pharmacist 06/23/2023 8:55 AM   Please refer to AMION for pharmacy phone number

## 2023-07-05 NOTE — Progress Notes (Unsigned)
[facility] [place]  ASSESSMENT / PLAN: 72 y.o. male with left venous thoracic outlet syndrome.  He may have a component of neurogenic thoracic outlet syndrome as well.  CT scan showed possible abnormality in periclavicular space that I was worried could be a head / neck cancer recurrence. MR chest performed and did not show any evidence of mass or abnormality in this space. I reviewed this extensively with the radiology team. We agreed this is likely not a recurrent cancer, but rather inflammation around the vein from DVT. We will plan to proceed with left first rib excision in the coming weeks. I will take a periclavicular approach, so that we can evaluate this space if necessary intraoperatively and do a nerve release. Dr. Karin Lieu co-case.  CHIEF COMPLAINT: Left arm swelling  HISTORY OF PRESENT ILLNESS: Gregory Jenkins is a 72 y.o. 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 Patient reports that last Monday 03/15/23 he woke up with his left arm swollen. Ultrasound at that time showed acute DVT involving the left subclavian, brachial, and axillary veins. He was started on Eliquis by his PCP and referred to the [facility] for further work up and management.    Today, patient reports that his arm has remained very swollen, but it isn't bothering him. No pain or tingling. 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 In the 1-2 weeks prior to symptom onset, he was digging out an old tree stump that did involve swinging an axe over his head repeatedly. He is tolerating Eliquis well with no abnormal bleeding or bruising and no missed doses.   04/13/23: Patient returns to clinic to review his CT scan of his chest.  Surprisingly a mass was  identified in the left thoracic outlet abutting the vascular structures.  This was nonspecific on CT scan and so formal diagnosis could not be given.  Reviewed the differential diagnosis and my worry for recurrence of head and neck cancer.  We made a plan to obtain an MR scan to better evaluate this finding.  7/2/4: Patient returns to clinic to review his MRI scan.  Thankfully MRI shows evidence of mass as question on CT scan.  I reviewed these scans with several radiologist over the past weeks.  I reviewed these discussions with the patient and his wife.  Patient is also describing some shoulder discomfort with radiating symptoms down the left arm.  I again reviewed the rationale for first rib excision.  Past Medical History:  Diagnosis Date   Abdominal pain 03/19/2016   Allergic rhinitis    Cancer of tonsillar fossa (HCC)    left   Clotting disorder (HCC)    PE, suspect lupus anticoagulant   Drug-induced skin rash 12/25/2015   History of kidney stones 2000   History of nephrolithiasis    History of pulmonary embolism    History of radiation therapy 12/17/2015- 02/03/16   Left Tonsil and Bilateral Neck   Hypothyroidism    Mucositis oral 01/08/2016   Sleep apnea 2000   Thyroid disease    Tonsil cancer [facility])     Past Surgical History:  Procedure Laterality Date   AMPUTATION Left 01/01/2018   Procedure: REVISION AMPUTATION RING FINGER;  Surgeon: Bradly Bienenstock, MD;  Location: [facility] OR;  Service:  Orthopedics;  Laterality: Left;   CARPAL TUNNEL RELEASE Left 11/14/2021   Procedure: LEFT CARPAL TUNNEL RELEASE;  Surgeon: Betha Loa, MD;  Location:  SURGERY CENTER;  Service: Orthopedics;  Laterality: Left;  30 MIN   CHOLECYSTECTOMY N/A 06/22/2023   Procedure: LAPAROSCOPIC CHOLECYSTECTOMY WITH ICG DYE;  Surgeon: Harriette Bouillon, MD;  Location: [facility] OR;  Service: General;  Laterality: N/A;   EYE SURGERY Right 1980's   H/O right orbital "blowout" fracture   FOOT SURGERY Left    [facility]  Neuroma   GASTROSTOMY TUBE PLACEMENT     IR GASTROSTOMY TUBE REMOVAL     IR GENERIC HISTORICAL  10/12/2016   IR REMOVAL TUN ACCESS W/ PORT W/O FL MOD SED 10/12/2016 Gilmer Mor, DO [facility]   KNEE SURGERY Right    Arthroscopic   PORTA CATH INSERTION     RIB RESECTION Left 05/31/2023   Procedure: LEFT FIRST RIB EXCISION;  Surgeon: Leonie Douglas, MD;  Location: [facility] OR;  Service: Vascular;  Laterality: Left;   VENOGRAM N/A 05/31/2023   Procedure: LEFT UPPER EXTREMITY AND CENTRAL VENOGRAM;  Surgeon: Leonie Douglas, MD;  Location: [facility] OR;  Service: Vascular;  Laterality: N/A;    Family History  Problem Relation Age of Onset   Cancer Father        esophageal ca   Cancer Maternal Grandmother        brain ca   Cancer Maternal Grandfather        lung ca   COPD Mother    Cancer Maternal Aunt        breast ca    Social History   Socioeconomic History   Marital status: Married    Spouse name: Not on file   Number of children: 3   Years of education: Not on file   Highest education level: Not on file  Occupational History   Not on file  Tobacco Use   Smoking status: Never   Smokeless tobacco: Former    Types: Chew    Quit date: 11/20/2006  Vaping Use   Vaping status: Never Used  Substance and Sexual Activity   Alcohol use: Yes    Alcohol/week: 0.0 standard drinks of alcohol    Comment: He reports he is a social drinker   Drug use: No   Sexual activity: Not on file  Other Topics Concern   Not on file  Social History Narrative   Not on file   Social Determinants of Health   Financial Resource Strain: Not on file  Food Insecurity: No Food Insecurity (06/20/2023)   Hunger Vital Sign    Worried About Running Out of Food in the Last Year: Never true    Ran Out of Food in the Last Year: Never true  Transportation Needs: No Transportation Needs (06/20/2023)   PRAPARE - Administrator, Civil Service (Medical): No    Lack of Transportation (Non-Medical): No   Physical Activity: Not on file  Stress: Not on file  Social Connections: Not on file  Intimate Partner Violence: Not At Risk (06/20/2023)   Humiliation, Afraid, Rape, and Kick questionnaire    Fear of Current or Ex-Partner: No    Emotionally Abused: No    Physically Abused: No    Sexually Abused: No    No Known Allergies  Current Outpatient Medications  Medication Sig Dispense Refill   acetaminophen (TYLENOL) 500 MG tablet Take 1,000 mg by mouth every 6 (six) hours as needed for moderate pain.  apixaban (ELIQUIS) 5 MG TABS tablet Take 1 tablet (5 mg total) by mouth 2 (two) times daily. 60 tablet 0   aspirin EC 81 MG tablet Take 81 mg by mouth daily.     atorvastatin (LIPITOR) 10 MG tablet Take 10 mg by mouth daily.     cyanocobalamin (VITAMIN B12) 1000 MCG/ML injection Inject 1,000 mcg into the muscle every 30 (thirty) days.     ipratropium (ATROVENT) 0.06 % nasal spray Place 2 sprays into both nostrils daily as needed for rhinitis.     levothyroxine (SYNTHROID) 125 MCG tablet Take 125 mcg by mouth daily before breakfast.  3   methocarbamol (ROBAXIN) 500 MG tablet Take 1 tablet (500 mg total) by mouth every 6 (six) hours as needed for muscle spasms. 30 tablet 0   oxyCODONE (OXY IR/ROXICODONE) 5 MG immediate release tablet Take 1 tablet (5 mg total) by mouth every 4 (four) hours as needed for moderate pain. 15 tablet 0   No current facility-administered medications for this visit.    PHYSICAL EXAM There were no vitals filed for this visit.   Well-appearing man in no acute distress Regular rate and rhythm Unlabored breathing Swelling in the left arm is improved overall Radial pulses are palpable  PERTINENT LABORATORY AND RADIOLOGIC DATA  Most recent CBC    Latest Ref Rng & Units 06/21/2023    4:22 AM 06/20/2023    7:28 AM 06/01/2023    8:33 AM  CBC  WBC 4.0 - 10.5 K/uL 5.6  7.4  9.7   Hemoglobin 13.0 - 17.0 g/dL 44.0  10.2  72.5   Hematocrit 39.0 - 52.0 % 33.1  38.4   38.4   Platelets 150 - 400 K/uL 260  318  197      Most recent CMP    Latest Ref Rng & Units 06/21/2023    4:22 AM 06/20/2023    7:28 AM 05/31/2023   11:45 AM  CMP  Glucose 70 - 99 mg/dL 366  440  347   BUN 8 - 23 mg/dL 9  15  11    Creatinine 0.61 - 1.24 mg/dL 4.25  9.56  3.87   Sodium 135 - 145 mmol/L 138  135  137   Potassium 3.5 - 5.1 mmol/L 4.2  4.0  4.5   Chloride 98 - 111 mmol/L 103  103  103   CO2 22 - 32 mmol/L 26  24  27    Calcium 8.9 - 10.3 mg/dL 8.8  8.9  8.9   Total Protein 6.5 - 8.1 g/dL 5.8  7.1  6.7   Total Bilirubin 0.3 - 1.2 mg/dL 0.5  0.5  0.8   Alkaline Phos 38 - 126 U/L 68  82  70   AST 15 - 41 U/L 13  17  19    ALT 0 - 44 U/L 12  14  18     CT scan personally reviewed. I agree with the reading radiologist. There is an unexpected abnormality in the thoracic outlet causing some mass effect.   MRI chest personally reviewed.  Rande Brunt. Lenell Antu, MD [facility] Phone Number: [phone] 07/05/2023 8:20 PM   Total time spent on preparing this encounter including chart review, data review, collecting history, examining the patient, coordinating care for this established patient, 40 minutes.  Portions of this report may have been transcribed using voice recognition software.  Every effort has been made to ensure accuracy; however, inadvertent computerized transcription errors may  still be present.

## 2023-07-06 ENCOUNTER — Encounter: Payer: Self-pay | Admitting: Vascular Surgery

## 2023-07-06 ENCOUNTER — Ambulatory Visit (INDEPENDENT_AMBULATORY_CARE_PROVIDER_SITE_OTHER): Payer: 59 | Admitting: Vascular Surgery

## 2023-07-06 VITALS — BP 120/61 | HR 62 | Temp 98.0°F | Resp 20 | Ht 72.0 in | Wt 169.0 lb

## 2023-07-06 DIAGNOSIS — I871 Compression of vein: Secondary | ICD-10-CM

## 2024-06-21 ENCOUNTER — Other Ambulatory Visit: Payer: Self-pay

## 2024-06-21 DIAGNOSIS — C090 Malignant neoplasm of tonsillar fossa: Secondary | ICD-10-CM

## 2024-06-21 DIAGNOSIS — C099 Malignant neoplasm of tonsil, unspecified: Secondary | ICD-10-CM

## 2024-06-23 ENCOUNTER — Encounter (HOSPITAL_COMMUNITY)

## 2024-06-28 ENCOUNTER — Other Ambulatory Visit: Payer: Self-pay

## 2024-06-28 DIAGNOSIS — C090 Malignant neoplasm of tonsillar fossa: Secondary | ICD-10-CM

## 2024-06-29 ENCOUNTER — Other Ambulatory Visit: Payer: Self-pay

## 2024-06-29 DIAGNOSIS — C090 Malignant neoplasm of tonsillar fossa: Secondary | ICD-10-CM

## 2024-06-29 NOTE — Progress Notes (Signed)
 Head and Neck Cancer Location of Tumor / Histology:  Malignant Neoplasm of the Tonsillar Fossa  Patient presented  months ago with symptoms of:  Sensation of soreness on right side of throat when swallowing. The discomfort extends to the roof of his mouth. He frequently experiences pressure in his ear similar to the sensation of water dripping out  Biopsies revealed:   Nutrition Status Yes No Comments  Weight changes? []  [x]  Weight has stayed the same since 8 years   Swallowing concerns? []  [x]    PEG? []  [x]     Referrals Yes No Comments  Social Work? [x]  []    Dentistry? [x]  []    Swallowing therapy? [x]  []    Nutrition? [x]  []    Med/Onc? [x]  []     Safety Issues Yes No Comments  Prior radiation? [x]  []    Pacemaker/ICD? []  [x]    Possible current pregnancy? []  [x]    Is the patient on methotrexate? []  [x]     Tobacco/Marijuana/Snuff/ETOH use:  Does not smoke or drink alcohol  Past/Anticipated interventions by otolaryngology, if any:  Right Oropharynx Biopsy  8/25/202025 CT Soft Tissue Neck W Contrast      Past/Anticipated interventions by medical oncology, if any:      Current Complaints / other details:   None   BP (P) 125/72 (BP Location: Left Arm, Patient Position: Sitting)   Pulse (!) (P) 59   Temp (!) (P) 97.3 F (36.3 C) (Temporal)   Resp (P) 18   Ht (P) 5' 11 (1.803 m)   Wt (P) 168 lb 4 oz (76.3 kg)   BMI (P) 23.47 kg/m   Wt Readings from Last 3 Encounters:  07/07/24 (P) 168 lb 4 oz (76.3 kg)  07/06/23 169 lb (76.7 kg)  06/20/23 170 lb (77.1 kg)

## 2024-07-04 ENCOUNTER — Ambulatory Visit
Admission: RE | Admit: 2024-07-04 | Discharge: 2024-07-04 | Disposition: A | Discharge status: Home / Self Care | Payer: Self-pay | Source: Ambulatory Visit | Attending: Radiation Oncology | Admitting: Radiation Oncology

## 2024-07-04 ENCOUNTER — Other Ambulatory Visit: Payer: Self-pay

## 2024-07-04 ENCOUNTER — Encounter (HOSPITAL_COMMUNITY)
Admission: RE | Admit: 2024-07-04 | Discharge: 2024-07-04 | Disposition: A | Discharge status: Home / Self Care | Source: Ambulatory Visit | Attending: Radiation Oncology | Admitting: Radiation Oncology

## 2024-07-04 DIAGNOSIS — C090 Malignant neoplasm of tonsillar fossa: Secondary | ICD-10-CM

## 2024-07-04 LAB — GLUCOSE, CAPILLARY: Glucose-Capillary: 84 mg/dL (ref 70–99)

## 2024-07-04 MED ORDER — FLUDEOXYGLUCOSE F - 18 (FDG) INJECTION
7.9400 | Freq: Once | INTRAVENOUS | Status: AC
Start: 2024-07-04 — End: 2024-07-04
  Administered 2024-07-04: 7.94 via INTRAVENOUS

## 2024-07-05 NOTE — Progress Notes (Addendum)
 Radiation Oncology         [phone] ________________________________  Initial Outpatient Consultation  Name: Gregory Jenkins MRN: [id number]  Date: 07/07/2024  DOB: [birth year]  RR:Amjxz, Prentice [name], FNP  Carlie Clark, MD   REFERRING PHYSICIAN: Carlie Clark, MD  DIAGNOSIS:    ICD-10-CM   1. 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 male who presents today to discuss the role of radiation therapy in management of his recent recurrence of tonsillar cancer. He is known to us  for his initial diagnosis of left tonsillar cancer in 2017 which he received radiation therapy to the left tonsil and involved bilateral cervical lymph nodes. This was concurrent with systemic therapy consisting of cetuximab . He was last seen here for a follow-up visit on 10/02/2016 and was NED on examination and imaging at that time.   Since last being seen here, he continued to follow with Dr. Carlie (ENT) and with Dr. Lonn under surveillance until 2022. He did have follow-up imaging performed during that interval of time which consisted of a follow-up neck CT on 02/13/2019 that showed NED and stable post treatment changes in the neck.   He did however return to Dr. Ilona office on 02/24/22 for evaluation of a spot on the  back of his tongue. However, oral exam performed at that time did not show any abnormal findings to correlate with the reported concern.   Pertaining to his recent recurrence, he presented to Dr. Ilona office on 06/07/24 with a 3 month history of a sore throat which he characterized as feeling raw on the right side of his throat (with occasional extension to the roof of his mouth), as well as ear pressure. Oral exam performed at that time noted an exudative mass of the right tonsillar fossa. A biopsy of the right tonsillar mass was accordingly obtained at that time that showed findings consistent with invasive squamous cell carcinoma (moderately differentiated); p16 negative.   He then presented for a soft tissue neck CT with contrast on 06/19/24 which demonstrated: the right palatine tonsillar mass extending onto the soft palate and into the right glossotonsillar sulcus, consistent with SCC, measuring at least 26 mm. No abnormal lymph nodes were demonstrated in the neck. A chest CT was also performed that same day which showed several indeterminate punctate pulmonary nodules, but no definite evidence of pulmonary metastatic disease.   In most recent history, he was seen in consultation by Dr. Lauralee (surgical ENT) on 06/28/24 to discuss surgical treatment options. Although surgery would likely provide him with the best outcome, Dr. 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 In this setting, Dr. Lauralee would like him to be considered for re-irradiation which we will discuss in detail today. He  has also been referred back to medical oncology and will meet with Dr. Autumn on 07/12/24 to discuss systemic therapy options.    Pertinent imaging performed thus far includes a PET scan performed on 07/04/24 which demonstrated: the hypermetabolic lesion in the vicinity of the right palatine tonsil consistent with malignancy, and with  potential extension to the uvula, and a hypermetabolic nodule in the upper right parotid gland with potential differential considerations including primary parotid neoplasm vs a metastatic lymph node noted. Imaging otherwise showed no evidence of metastatic disease to the chest, abdomen, or pelvis. Other findings of potential clinical significance included a bladder calculus and prostatomegaly with a prominent median lobe indenting the bladder base.  Swallowing issues, if any: approximately 3 month history of right sided sore throat   Weight Changes:  Wt Readings from Last 3 Encounters:  07/07/24 (P) 168 lb 4 oz (76.3 kg)  07/06/23 169 lb (76.7 kg)  06/20/23 170 lb (77.1 kg)     Other symptoms: ear pressure (akin to the sensation of water dripping out, even though there is no actual discharge), some ear pain when eating that radiates to the ear, occasional blood in his phlegm when clearing his throat in the morning (though infrequent in occurrence), jaw soreness, and mild vocal hoarseness   Tobacco history, if any: history of smokeless tobacco use consisting of chew - quit 17 years ago   ETOH abuse, if any: drinks on occasion   Prior cancers, if any: prior history of tonsillar cancer as noted below  More details from discussion today:  Gregory Jenkins is a 73 year old male with a history of left tonsil cancer treated in 2017 who presents with a new right tonsil cancer.  Imaging reveals a small spot in the parotid gland. He experiences jaw and ear pain on the right side for two to three months, initially with popping while eating, now causing pressure radiating to the ear.  In 2017, his left tonsil cancer was HPV-positive, involving four lymph nodes on the left and one on the right. Previous radiation covered a large throat area. He is concerned about further radiation or surgery and potential side effects.  He has hearing loss and significant dental issues, including implants and  fillings, worsened by prior radiation. He is concerned about further radiation's impact on dental health.  He experiences thick saliva and phlegm, attributing it to the tumor. During previous treatment, his taste was unaffected, but he could not tolerate spicy foods.   PATH: 06-07-24  A.  RIGHT TONSIL, BIOPSY:  Invasive squamous cell carcinoma, moderately differentiated. HPV/p16 negative                PREVIOUS RADIATION THERAPY: Yes, in 2017 for SCC of the left tonsil w/ cervical lymph node involvement   Diagnosis: T1N2cM0 Stage IVA Left Tonsil squamous cell carcinoma Indication for treatment:  Curative with systemic therapy      Radiation treatment dates:   12/17/2015-02/03/2016 Site/dose:   Left tonsil and bilateral neck / 70 Gy in 35 fractions to gross disease, 63 Gy in 35 fractions to high risk nodal echelons, and 56 Gy in 35 fractions to intermediate risk nodal echelons Beams/energy:   Helical IMRT / 6 MV photons  PAST MEDICAL HISTORY:  has a past medical history of Abdominal pain (03/19/2016), Allergic rhinitis, Cancer of tonsillar fossa (HCC), Clotting disorder (HCC), Drug-induced skin rash (12/25/2015), History of kidney stones (2000), History of nephrolithiasis, History of pulmonary embolism, History of radiation therapy (  12/17/2015- 02/03/16), Hypothyroidism, Mucositis oral (01/08/2016), Sleep apnea (2000), Thyroid  disease, and Tonsil cancer (HCC).    PAST SURGICAL HISTORY: Past Surgical History:  Procedure Laterality Date   AMPUTATION Left 01/01/2018   Procedure: REVISION AMPUTATION RING FINGER;  Surgeon: Shari Easter, MD;  Location: [facility] OR;  Service: Orthopedics;  Laterality: Left;   CARPAL TUNNEL RELEASE Left 11/14/2021   Procedure: LEFT CARPAL TUNNEL RELEASE;  Surgeon: Murrell Drivers, MD;  Location: [facility];  Service: Orthopedics;  Laterality: Left;  30 MIN   CHOLECYSTECTOMY N/A 06/22/2023   Procedure: LAPAROSCOPIC CHOLECYSTECTOMY WITH ICG DYE;  Surgeon: Vanderbilt Ned, MD;  Location: [facility] OR;  Service: General;  Laterality: N/A;   EYE SURGERY Right 1980's   H/O right orbital blowout fracture   FOOT SURGERY Left    [facility] Neuroma   GASTROSTOMY TUBE PLACEMENT     IR GASTROSTOMY TUBE REMOVAL     IR GENERIC HISTORICAL  10/12/2016   IR REMOVAL TUN ACCESS W/ PORT W/O FL MOD SED 10/12/2016 Ami Bellman, DO [facility]   KNEE SURGERY Right    Arthroscopic   PORTA CATH INSERTION     RIB RESECTION Left 05/31/2023   Procedure: LEFT FIRST RIB EXCISION;  Surgeon: Magda Ned SAILOR, MD;  Location[facility] OR;  Service: Vascular;  Laterality: Left;   VENOGRAM N/A 05/31/2023   Procedure: LEFT UPPER EXTREMITY AND CENTRAL VENOGRAM;  Surgeon: Magda Ned SAILOR, MD;  Location: [facility] OR;  Service: Vascular;  Laterality: N/A;    FAMILY HISTORY: family history includes COPD in his mother; Cancer in his father, maternal aunt, maternal grandfather, and maternal grandmother; Lung cancer in his brother.  SOCIAL HISTORY:  reports that he has never smoked. He quit smokeless tobacco use about 17 years ago.  His smokeless tobacco use included chew. He reports current alcohol use. He reports that he does not use drugs.  ALLERGIES: Patient has no known allergies.  MEDICATIONS:  Current Outpatient Medications  Medication Sig Dispense Refill   acetaminophen  (TYLENOL ) 500 MG tablet Take 1,000 mg by mouth every 6 (six) hours as needed for moderate pain.     aspirin  EC 81 MG tablet Take 81 mg by mouth daily.     atorvastatin  (LIPITOR) 10 MG tablet Take 10 mg by mouth daily.     cyanocobalamin (VITAMIN B12) 1000 MCG/ML injection Inject 1,000 mcg into the muscle every 30 (thirty) days.     ipratropium (ATROVENT) 0.06 % nasal spray Place 2 sprays into both nostrils daily as needed for rhinitis.     levothyroxine  (SYNTHROID ) 125 MCG tablet Take 125 mcg by mouth daily before breakfast.  3   apixaban  (ELIQUIS ) 5 MG TABS tablet Take 1 tablet (5 mg total) by mouth 2 (two) times daily. (Patient  not taking: Reported on 07/07/2024) 60 tablet 0   No current facility-administered medications for this encounter.    REVIEW OF SYSTEMS:  Notable for that above.   PHYSICAL EXAM:  height is 5' 11 (1.803 m) (pended) and weight is 168 lb 4 oz (76.3 kg) (pended). His temporal temperature is 97.3 F (36.3 C) (abnormal, pended). His blood pressure is 125/72 (pended) and his pulse is 59 (abnormal, pended). His respiration is 18 (pended).   General: Alert and oriented, in no acute distress HEENT: Head is normocephalic. Extraocular movements are intact. Oropharynx is notable for  Tumor arising from the majority of the right tonsil, with the edge starting to abut the uvula. Tongue midline. No trismus, good jaw range of  motion. Tympanic membranes clear bilaterally.. Neck: Post treatment fibrosis consistent with previous radiation. Skin tanned, slightly erythematous. No palpable lymph nodes throughout the neck. No masses palpable in parotid glands. Heart: Regular in rate and rhythm with no murmurs, rubs, or gallops. Chest: Clear to auscultation bilaterally, with no rhonchi, wheezes, or rales. Abdomen: Soft, nontender, nondistended, with no rigidity or guarding. Extremities: No cyanosis or edema. Lymphatics: see Neck Exam Skin: No concerning lesions. Musculoskeletal: symmetric strength and muscle tone throughout. Neurologic: Cranial nerves II through XII are grossly intact. No obvious focalities. Speech is fluent. Coordination is intact. Psychiatric: Judgment and insight are intact. Affect is appropriate.    ECOG = 1  0 - Asymptomatic (Fully active, able to carry on all predisease activities without restriction)  1 - Symptomatic but completely ambulatory (Restricted in physically strenuous activity but ambulatory and able to carry out work of a light or sedentary nature. For example, light housework, office work)  2 - Symptomatic, <50% in bed during the day (Ambulatory and capable of all self care  but unable to carry out any work activities. Up and about more than 50% of waking hours)  3 - Symptomatic, >50% in bed, but not bedbound (Capable of only limited self-care, confined to bed or chair 50% or more of waking hours)  4 - Bedbound (Completely disabled. Cannot carry on any self-care. Totally confined to bed or chair)  5 - Death   Raylene MM, Creech RH, Tormey DC, et al. [phone]). Toxicity and response criteria of the [facility]. Am. DOROTHA Bridges. Oncol. 5 (6): 649-55   LABORATORY DATA:  Lab Results  Component Value Date   WBC 5.6 06/21/2023   HGB 11.0 (L) 06/21/2023   HCT 33.1 (L) 06/21/2023   MCV 97.4 06/21/2023   PLT 260 06/21/2023   CMP     Component Value Date/Time   NA 138 06/21/2023 0422   NA 139 05/26/2016 1111   K 4.2 06/21/2023 0422   K 4.5 05/26/2016 1111   CL 103 06/21/2023 0422   CO2 26 06/21/2023 0422   CO2 28 05/26/2016 1111   GLUCOSE 111 (H) 06/21/2023 0422   GLUCOSE 97 05/26/2016 1111   BUN 9 06/21/2023 0422   BUN 18.8 10/01/2016 0956   CREATININE 1.08 06/21/2023 0422   CREATININE 1.0 10/01/2016 0956   CALCIUM  8.8 (L) 06/21/2023 0422   CALCIUM  9.9 05/26/2016 1111   PROT 5.8 (L) 06/21/2023 0422   PROT 7.2 05/26/2016 1111   ALBUMIN 2.5 (L) 06/21/2023 0422   ALBUMIN 3.7 05/26/2016 1111   AST 13 (L) 06/21/2023 0422   AST 16 05/26/2016 1111   ALT 12 06/21/2023 0422   ALT 15 05/26/2016 1111   ALKPHOS 68 06/21/2023 0422   ALKPHOS 81 05/26/2016 1111   BILITOT 0.5 06/21/2023 0422   BILITOT 0.74 05/26/2016 1111   GFRNONAA >60 06/21/2023 0422   GFRAA >60 04/07/2019 0859      Lab Results  Component Value Date   TSH 2.265 06/20/2023     RADIOGRAPHY: NM PET Image Restag (PS) Skull Base To Thigh Result Date: 07/06/2024 CLINICAL DATA:  Subsequent treatment strategy for and neck cancer. EXAM: NUCLEAR MEDICINE PET SKULL BASE TO THIGH TECHNIQUE: 7.9 mCi F-18 FDG was injected intravenously. Full-ring PET imaging was performed from the  skull base to thigh after the radiotracer. CT data was obtained and used for attenuation correction and anatomic localization. Fasting blood glucose: 84 mg/dl COMPARISON:  PET-CT 11/02/7980 FINDINGS: Mediastinal blood pool activity: SUV  max 2.3 Liver activity: SUV max NA NECK: Upper right parotid nodule or lymph node poorly appreciable on the CT data but with maximum SUV 9.9. Hypermetabolic lesion in the expected vicinity of the right right palatine tonsil with potential extension of by the uvula, maximum SUV 11.1. Incidental CT findings: None. CHEST: No significant abnormal hypermetabolic activity in this region. Incidental CT findings: Aortic and coronary atherosclerosis. Stable 3 mm left upper lobe nodule on image 25 series 7, benign. No further imaging workup of this lesion is indicated. ABDOMEN/PELVIS: No significant abnormal hypermetabolic activity in this region. Incidental CT findings: Cholecystectomy. Iliac atherosclerosis. Stellate 8 mm bladder calculus, image 195 series 4. Prostatomegaly with prominent median lobe indenting the bladder base. SKELETON: No significant abnormal hypermetabolic activity in this region. Incidental CT findings: Bilateral pars defects at L5. Large hemangioma in the right L2 vertebra. IMPRESSION: 1. Hypermetabolic lesion in the expected vicinity of the right palatine tonsil with potential extension of by the uvula, maximum SUV 11.1. This is compatible with malignancy. 2. Hypermetabolic nodule in the upper right parotid gland, maximum SUV 9.9. This could be a primary parotid neoplasm or a metastatic lymph node. 3. No findings of metastatic disease to the chest, abdomen, or pelvis. 4. Stellate 8 mm bladder calculus. 5. Prostatomegaly with prominent median lobe indenting the bladder base. 6. Bilateral pars defects at L5. 7.  Aortic Atherosclerosis (ICD10-I70.0). Electronically Signed   By: Ryan Salvage M.D.   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 Jaw and ear pain likely referred from the tumor. - Present case at tumor board next Wednesday. - Discuss surgical options with Dr. Lebron.en - Consider MRI of tonsil area. - Consider biopsy of parotid gland nodule - Schedule CT scan for radiation planning if radiation is chosen. - Discuss potential chemotherapy options with Dr. Autumn. - Pre-schedule feeding tube placement in 5th or 6th week of radiation if combined with chemotherapy (use wait and see method unless med/onc disagrees). - Refer to nutritionist for dietary management. - Refer to multidisciplinary clinic including swallowing therapist, social worker, and physical therapist.  Post-radiation fibrosis of neck Post-radiation fibrosis consistent with previous treatment. No palpable lymph nodes or masses in parotid regions.  Dental disease Dental health compromised due to previous and potential radiation treatment. Risk of exacerbated dental problems, dry mouth, and affected tooth roots. - Refer to dentist for evaluation and potential retainer. - Discuss dental health maintenance with dentist.  Hearing loss Hearing loss noted.   - Discuss potential chemotherapy options with Dr. Conchetta that are gentler on hearing.    On date of service, in total, I spent 80 minutes on this encounter. Patient was seen in person. Note signed after encounter date; minutes pertain to date of service, only.   __________________________________________   Lauraine [name], MD  This document serves as a record of services personally performed by Lauraine [name], MD. It was created on her behalf by Dorthy Fuse, a trained medical scribe. The creation of this record is based on the scribe's personal observations and the provider's statements to them. This document has  been checked and approved by the attending provider.

## 2024-07-07 ENCOUNTER — Encounter: Payer: Self-pay | Admitting: Radiation Oncology

## 2024-07-07 ENCOUNTER — Ambulatory Visit
Admission: RE | Admit: 2024-07-07 | Discharge: 2024-07-07 | Disposition: A | Discharge status: Home / Self Care | Source: Ambulatory Visit | Attending: Radiation Oncology | Admitting: Radiation Oncology

## 2024-07-07 DIAGNOSIS — I700 Atherosclerosis of aorta: Secondary | ICD-10-CM | POA: Insufficient documentation

## 2024-07-07 DIAGNOSIS — B977 Papillomavirus as the cause of diseases classified elsewhere: Secondary | ICD-10-CM | POA: Diagnosis not present

## 2024-07-07 DIAGNOSIS — Z79890 Hormone replacement therapy (postmenopausal): Secondary | ICD-10-CM | POA: Diagnosis not present

## 2024-07-07 DIAGNOSIS — I2510 Atherosclerotic heart disease of native coronary artery without angina pectoris: Secondary | ICD-10-CM | POA: Insufficient documentation

## 2024-07-07 DIAGNOSIS — Z7901 Long term (current) use of anticoagulants: Secondary | ICD-10-CM | POA: Diagnosis not present

## 2024-07-07 DIAGNOSIS — I708 Atherosclerosis of other arteries: Secondary | ICD-10-CM | POA: Diagnosis not present

## 2024-07-07 DIAGNOSIS — H9201 Otalgia, right ear: Secondary | ICD-10-CM | POA: Diagnosis not present

## 2024-07-07 DIAGNOSIS — C090 Malignant neoplasm of tonsillar fossa: Secondary | ICD-10-CM | POA: Insufficient documentation

## 2024-07-07 DIAGNOSIS — N210 Calculus in bladder: Secondary | ICD-10-CM | POA: Insufficient documentation

## 2024-07-07 DIAGNOSIS — Z923 Personal history of irradiation: Secondary | ICD-10-CM | POA: Insufficient documentation

## 2024-07-07 DIAGNOSIS — N400 Enlarged prostate without lower urinary tract symptoms: Secondary | ICD-10-CM | POA: Diagnosis not present

## 2024-07-07 DIAGNOSIS — Z87891 Personal history of nicotine dependence: Secondary | ICD-10-CM | POA: Insufficient documentation

## 2024-07-07 DIAGNOSIS — Z79899 Other long term (current) drug therapy: Secondary | ICD-10-CM | POA: Insufficient documentation

## 2024-07-07 DIAGNOSIS — Z801 Family history of malignant neoplasm of trachea, bronchus and lung: Secondary | ICD-10-CM | POA: Diagnosis not present

## 2024-07-07 DIAGNOSIS — R918 Other nonspecific abnormal finding of lung field: Secondary | ICD-10-CM | POA: Insufficient documentation

## 2024-07-07 DIAGNOSIS — Z7982 Long term (current) use of aspirin: Secondary | ICD-10-CM | POA: Diagnosis not present

## 2024-07-10 ENCOUNTER — Encounter: Payer: Self-pay | Admitting: Radiation Oncology

## 2024-07-10 NOTE — Progress Notes (Signed)
 Dental Form with Estimates of Radiation Dose  Gregory Jenkins Date of birth: December 27, 1950     Diagnosis: previous left tonsil cancer, now with right tonsil cancer T2NXM0 p16-   Cancer Staging  Malignant neoplasm of tonsillar fossa (HCC) Staging form: Pharynx - Oropharynx, AJCC 7th Edition - Clinical stage from 12/10/2015: Stage IVA (T1, N2c, M0) - Signed by Lonn Hicks, MD on 12/10/2015 Staged by: Managing physician Specimen type: Biopsy / Limited Resection Laterality: Left   Prognosis: curative  Anticipated # of fractions: 30-35    Daily?: yes  # of weeks of radiotherapy: 6-7  Chemotherapy?: possible  Anticipated xerostomia:  Mild permanent    Pre-simulation needs:   Scatter protection    Simulation: ASAP if he chooses RT - he is still considering surgery up front given previous radiation history for contralateral tonsil cancer, but surgery has significant risks  Other Notes:   Please contact Lauraine [name], MD, with patient's disposition after evaluation and/or dental treatment. -----------------------------------  Lauraine [name], MD

## 2024-07-10 NOTE — Addendum Note (Signed)
 Encounter addended by: Izell Domino, MD on: 07/10/2024 2:50 PM  Actions taken: Clinical Note Signed

## 2024-07-12 ENCOUNTER — Other Ambulatory Visit: Payer: Self-pay

## 2024-07-12 ENCOUNTER — Inpatient Hospital Stay: Attending: Oncology | Admitting: Oncology

## 2024-07-12 ENCOUNTER — Inpatient Hospital Stay

## 2024-07-12 VITALS — BP 110/60 | HR 62 | Temp 98.1°F | Resp 17 | Wt 169.2 lb

## 2024-07-12 DIAGNOSIS — C099 Malignant neoplasm of tonsil, unspecified: Secondary | ICD-10-CM | POA: Diagnosis not present

## 2024-07-12 DIAGNOSIS — Z803 Family history of malignant neoplasm of breast: Secondary | ICD-10-CM | POA: Insufficient documentation

## 2024-07-12 DIAGNOSIS — Z923 Personal history of irradiation: Secondary | ICD-10-CM | POA: Insufficient documentation

## 2024-07-12 DIAGNOSIS — Z808 Family history of malignant neoplasm of other organs or systems: Secondary | ICD-10-CM | POA: Diagnosis not present

## 2024-07-12 DIAGNOSIS — E538 Deficiency of other specified B group vitamins: Secondary | ICD-10-CM | POA: Insufficient documentation

## 2024-07-12 DIAGNOSIS — Z801 Family history of malignant neoplasm of trachea, bronchus and lung: Secondary | ICD-10-CM | POA: Diagnosis not present

## 2024-07-12 DIAGNOSIS — C090 Malignant neoplasm of tonsillar fossa: Secondary | ICD-10-CM

## 2024-07-12 DIAGNOSIS — Z87891 Personal history of nicotine dependence: Secondary | ICD-10-CM | POA: Insufficient documentation

## 2024-07-12 DIAGNOSIS — K118 Other diseases of salivary glands: Secondary | ICD-10-CM | POA: Insufficient documentation

## 2024-07-12 DIAGNOSIS — Z9221 Personal history of antineoplastic chemotherapy: Secondary | ICD-10-CM | POA: Insufficient documentation

## 2024-07-12 DIAGNOSIS — Z79620 Long term (current) use of immunosuppressive biologic: Secondary | ICD-10-CM | POA: Diagnosis not present

## 2024-07-12 DIAGNOSIS — Z79899 Other long term (current) drug therapy: Secondary | ICD-10-CM | POA: Insufficient documentation

## 2024-07-12 DIAGNOSIS — E039 Hypothyroidism, unspecified: Secondary | ICD-10-CM | POA: Insufficient documentation

## 2024-07-12 DIAGNOSIS — Z85818 Personal history of malignant neoplasm of other sites of lip, oral cavity, and pharynx: Secondary | ICD-10-CM | POA: Insufficient documentation

## 2024-07-12 LAB — CMP (CANCER CENTER ONLY)
ALT: 13 U/L (ref 0–44)
AST: 14 U/L — ABNORMAL LOW (ref 15–41)
Albumin: 4.3 g/dL (ref 3.5–5.0)
Alkaline Phosphatase: 72 U/L (ref 38–126)
Anion gap: 4 — ABNORMAL LOW (ref 5–15)
BUN: 16 mg/dL (ref 8–23)
CO2: 33 mmol/L — ABNORMAL HIGH (ref 22–32)
Calcium: 9.9 mg/dL (ref 8.9–10.3)
Chloride: 105 mmol/L (ref 98–111)
Creatinine: 0.9 mg/dL (ref 0.61–1.24)
GFR, Estimated: >60 mL/min (ref >60)
Glucose, Bld: 97 mg/dL (ref 70–99)
Potassium: 4.6 mmol/L (ref 3.5–5.1)
Sodium: 142 mmol/L (ref 135–145)
Total Bilirubin: 0.4 mg/dL (ref 0.0–1.2)
Total Protein: 7.3 g/dL (ref 6.5–8.1)

## 2024-07-12 LAB — CBC WITH DIFFERENTIAL (CANCER CENTER ONLY)
Abs Immature Granulocytes: 0.01 K/uL (ref 0.00–0.07)
Basophils Absolute: 0 K/uL (ref 0.0–0.1)
Basophils Relative: 0 %
Eosinophils Absolute: 0.1 K/uL (ref 0.0–0.5)
Eosinophils Relative: 1 %
HCT: 40.7 % (ref 39.0–52.0)
Hemoglobin: 13.5 g/dL (ref 13.0–17.0)
Immature Granulocytes: 0 %
Lymphocytes Relative: 14 %
Lymphs Abs: 0.9 K/uL (ref 0.7–4.0)
MCH: 31.6 pg (ref 26.0–34.0)
MCHC: 33.2 g/dL (ref 30.0–36.0)
MCV: 95.3 fL (ref 80.0–100.0)
Monocytes Absolute: 0.5 K/uL (ref 0.1–1.0)
Monocytes Relative: 7 %
Neutro Abs: 5.3 K/uL (ref 1.7–7.7)
Neutrophils Relative %: 78 %
Platelet Count: 199 K/uL (ref 150–400)
RBC: 4.27 MIL/uL (ref 4.22–5.81)
RDW: 12.9 % (ref 11.5–15.5)
WBC Count: 6.8 K/uL (ref 4.0–10.5)
nRBC: 0 % (ref 0.0–0.2)

## 2024-07-12 NOTE — Progress Notes (Unsigned)
 [facility]  ONCOLOGY CONSULT NOTE   PATIENT NAME: Gregory Jenkins   MR#: [id number] DOB: 1951/01/14  DATE OF SERVICE: 07/12/2024   REFERRING PROVIDER  Gregory [name], MD  Patient Care Team: Gregory Prentice [name], FNP as PCP - General (Family Medicine) Gregory Clark, MD as Consulting Physician (Otolaryngology) Jenkins Lauraine, MD as Attending Physician (Radiation Oncology) Gregory Jenkins, RD as Dietitian (Nutrition) Malmfelt, Delon CROME, RN as Oncology Nurse Navigator Gregory Millman, MD as Consulting Physician (Oncology) Gregory Chew, MD as Referring Physician (Otolaryngology)    CHIEF COMPLAINT/ PURPOSE OF CONSULTATION:   Recently diagnosed right tonsillar cancer.  Has history of left-sided tonsillar cancer treated in 2017 with chemoradiation using cetuximab .  ASSESSMENT & PLAN:   Gregory Jenkins is a 73 y.o. gentleman with a past medical history of left tonsillar squamous cell carcinoma, managed with concurrent chemoradiation using cetuximab  in 2017, went into remission, was referred to our clinic for recently diagnosed right tonsillar cancer. cT2,Nx,cM0,p16- right tonsillar squamous cell carcinoma.   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  He continued to have follow-ups up until 2022 with no evidence of disease clinically and on imaging.  Previously treated by Dr. Lonn in our clinic.  Now with a recurrence on the right side, p16 negative.  No evidence of metastatic disease on PET scan.  Nonspecific parotid lesion versus lymph node, pending further evaluation with biopsy.  He was seen in consultation by Dr. Lauralee (surgical ENT) on 06/28/24 to discuss surgical treatment options. Although surgery would likely provide him with the best outcome, Dr. Lauralee is concerned that his soft palate involvement and history of RT will increase his risk  of surgical complications. In this setting, Dr. Lauralee would like him to be considered for re-irradiation.   Patient did receive radiation to the bilateral cervical lymph nodes in the past.  Dr. [name] evaluated the patient and plan is for reirradiation for current recurrence.  His case was discussed in our ENT tumor conference on 07/12/2024.  Consensus opinion is to proceed with concurrent chemoradiation with cisplatin.  We have discussed about role of cisplatin being a radiosensitizer in the treatment of head and neck cancer.  We have discussed about the curative intent of chemoradiation for this patient.     We have discussed about mechanism of action of cisplatin, adverse effects of cisplatin including but not limited to fatigue, nausea, vomiting, increased risk of infections, mucositis, ototoxicity, nephrotoxicity, peripheral neuropathy.  Patient understands that some of the side effects can be permanent and even potentially fatal.  We have discussed about role of Mediport and G-tube for chemotherapy administration and nutrition respectively since most of these patients have severe mucositis during the treatment.  At this time we do not know if weekly cisplatin is inferior to every 21 days cisplatin since there is no head-to-head comparison trial. I did mention to the patient however weekly cisplatin is well-tolerated with less adverse effects and most of the patients tend to complete treatment as planned.  Patient is willing to proceed with weekly cisplatin.  Goal is to avoid surgery and achieve remission. Weekly administration allows dose adjustments based on tolerance and side effects, aiming for a less severe experience compared to high-dose regimens.  -He is scheduled for schedule MRI of neck on Monday, 07/17/2024.  -He was also referred to IR for biopsy of the parotid lesion.  - We  will arrange for chemotherapy education in the meantime and Port-A-Cath placement.  Plan is to begin  chemotherapy with cisplatin during the same week as radiation start.  - Monitor kidney function and blood counts weekly - Ensure hydration with 70-80 ounces of fluid daily - Prescribed antiemetics for nausea management  - Pre-schedule feeding tube placement for 5th or 6th week of treatment if needed  I will plan to see him around the same day that he is scheduled to begin radiation.  Right parotid gland lesion, possible neoplasm Right parotid gland lesion with concern for neoplasm.  Further evaluation required to determine the nature of the lesion. - Order parotid gland biopsy - Evaluate MRI results for further assessment  Hypothyroidism Long-standing hypothyroidism managed with thyroid  medication. Family history of thyroid  disorders. Current thyroid  function to be assessed with blood work. - Continue current thyroid  medication - Check thyroid  function with blood work  Vitamin B12 deficiency Vitamin B12 deficiency managed with monthly B12 injections. Coordination with oncology clinic to administer B12 injections during treatment visits to reduce additional appointments. - Administer B12 injection at oncology clinic starting in October - Coordinate with family doctor to transition B12 administration to oncology clinic  I reviewed lab results and outside records for this visit and discussed relevant results with the patient. Diagnosis, plan of care and treatment options were also discussed in detail with the patient. Opportunity provided to ask questions and answers provided to his apparent satisfaction. Provided instructions to call our clinic with any problems, questions or concerns prior to return visit. I recommended to continue follow-up with PCP and sub-specialists. He verbalized understanding and agreed with the plan. No barriers to learning was detected.  NCCN guidelines have been consulted in the planning of this patient's care.  Gregory Patten, MD  07/12/2024 5:27 PM    [facility] - A DEPT OF [facility] [address] [place] [address] Dept: [phone] Dept Fax: [phone]   HISTORY OF PRESENTING ILLNESS:   I have reviewed his chart and materials related to his cancer extensively and collaborated history with the patient. Summary of oncologic history is as follows:  ONCOLOGY HISTORY:  Initial diagnosis of left tonsillar cancer in 2017 for which he received radiation therapy to the left tonsil and involved bilateral cervical lymph nodes. This was concurrent with systemic therapy consisting of cetuximab . He was last seen by Rad Onc for a follow-up visit on 10/02/2016 and was NED on examination and imaging at that time.    He continued to follow with Dr. Carlie (ENT) and with Dr. Lonn under surveillance until 2022. He did have follow-up imaging performed during that interval of time which consisted of a follow-up neck CT on 02/13/2019 that showed NED and stable post treatment changes in the neck.    He did however return to Dr. Ilona office on 02/24/22 for evaluation of a spot on the back of his tongue. However, oral exam performed at that time did not show any abnormal findings to correlate with the reported concern.    He presented to Dr. Ilona office on 06/07/24 with a 3 month history of a sore throat which he characterized as feeling raw on the right side of his throat (with occasional extension to the roof of his mouth), as well as ear pressure. Oral exam performed at that time noted an exudative mass of the right tonsillar fossa. A biopsy of the right tonsillar mass was accordingly  obtained at that time that showed findings consistent with invasive squamous cell carcinoma (moderately differentiated); p16 negative.    Soft tissue neck CT with contrast on 06/19/24 demonstrated: the right palatine tonsillar mass extending onto the soft palate and into the right glossotonsillar sulcus,  consistent with SCC, measuring at least 26 mm. No abnormal lymph nodes were demonstrated in the neck. A chest CT was also performed that same day which showed several indeterminate punctate pulmonary nodules, but no definite evidence of pulmonary metastatic disease.    He was seen in consultation by Dr. Lauralee (surgical ENT) on 06/28/24 to discuss surgical treatment options. Although surgery would likely provide him with the best outcome, Dr. 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 In this setting, Dr. Lauralee would like him to be considered for re-irradiation. He has also been referred back to medical oncology.   PET scan performed on 07/04/24 demonstrated: the hypermetabolic lesion in the vicinity of the right palatine tonsil consistent with malignancy, and with potential extension to the uvula, and a hypermetabolic nodule in the upper right parotid gland with potential differential considerations including primary parotid neoplasm vs a metastatic lymph node noted. Imaging otherwise showed no evidence of metastatic disease to the chest, abdomen, or pelvis. Other findings of potential clinical significance included a bladder calculus and prostatomegaly with a prominent median lobe indenting the bladder base.  cT2,Nx,cM0,p16- right tonsillar squamous cell carcinoma.   His case was discussed in tumor conference on 07/12/2024.  Plan is to proceed with MRI of the neck for further evaluation and delineation of the tonsil mass.  Also plan for biopsy of the parotid lesion.  Plan is to proceed with concurrent chemoradiation using weekly cisplatin.  Oncology History  Malignant neoplasm of tonsillar fossa (HCC)  11/12/2015 Procedure   Accession: [id number] FNA of left neck LN positive for squamous cell cancer   11/18/2015 Imaging   Left greater than right cervical lymphadenopathy suspicious for nodal metastatic disease.2. Mild  asymmetry of the left tonsil -- correlate with direct visualization to assess for primary neoplasm.   11/20/2015 Procedure   He has left tonsil biopsy   11/20/2015 Pathology Results   Accession: [id number] Left tonsil biopsy showed squamous cell cancer, p16 positive.   11/28/2015 PET scan   1. Left palatine tonsil primary with left worse than right cervical nodal metastasis. 2. No extracervical hypermetabolic metastasis.    12/12/2015 Procedure   Peg and port-a-cath placement.   12/18/2015 - 01/29/2016 Chemotherapy   He received weekly cetuximab    12/18/2015 - 02/03/2016 Radiation Therapy   Received Helicle IMRT Tomotherapy:  Left tonsil and bilateral neck / 70 Gy in 35 fractions to gross disease, 63 Gy in 35 fractions to high risk nodal echelons, and 56 Gy in 35 fractions to intermediate risk nodal echelons.   12/31/2015 Adverse Reaction   Treatment #3 is placed on hold due to worsening mucositis   01/08/2016 Miscellaneous   Cetuximab  #3 resumed with 50% dose adjustment   01/14/2016 Adverse Reaction   Treatment #4 is placed on hold due to worsening mucositis and failure to thrive   03/20/2016 Imaging   CT abdomen showed mild gallbladder wall thickening with pericholecystic fluid and several gallstones. The gallbladder is nondistended. Recommend clinical correlation for acute or chronic cholecystitis.   04/15/2016 Procedure   PEG removed.   05/26/2016 Imaging   Restaging PET:  Marked interval improvement with resolution of left tonsillar hypermetabolism and  right cervical lymph node hypermetabolism. Left cervical lymph nodes have decreased substantially in size and there is a subtle degree of FDG hypermetabolism still associated with these left-sided cervical nodes.   10/12/2016 Procedure   Port-a-cath removed.   02/14/2019 Imaging   CT neck 1. Satisfactory post treatment appearance of the neck. NI-Rads category 1. 2. No mass or abnormality to correspond to the marked area of concern at the  level of the right submandibular gland.       INTERVAL HISTORY:  Discussed the use of AI scribe software for clinical note transcription with the patient, who gave verbal consent to proceed.  History of Present Illness Gregory Jenkins is a 73 year old male with a history of tonsillar cancer who presents for evaluation of cancer recurrence and treatment planning.  He has a history of left  tonsillar cancer treated approximately 8 years ago with cetuximab  and radiation. Significant side effects from cetuximab  included a rash over his trunk and open sores in his mouth, leading to missed doses. Radiation was the primary treatment at that time.  There are concerns about a lymph node and the parotid gland on the right side. An MRI is scheduled to further evaluate these areas, and a biopsy of the parotid gland is also planned.  He has jaw pain that radiates to his ear, present since before his recent appointment. He describes a sensation of popping in his jaw when chewing, but no pain in the back of his mouth.  He is currently on thyroid  medication and receives monthly B12 injections. His family has a history of thyroid  issues, and he has been on thyroid  medication for a long time.   MEDICAL HISTORY:  Past Medical History:  Diagnosis Date   Abdominal pain 03/19/2016   Allergic rhinitis    Cancer of tonsillar fossa (HCC)    left   Clotting disorder (HCC)    PE, suspect lupus anticoagulant   Drug-induced skin rash 12/25/2015   History of kidney stones 2000   History of nephrolithiasis    History of pulmonary embolism    History of radiation therapy 12/17/2015- 02/03/16   Left Tonsil and Bilateral Neck   Hypothyroidism    Mucositis oral 01/08/2016   Sleep apnea 2000   Thyroid  disease    Tonsil cancer (HCC)     SURGICAL HISTORY: Past Surgical History:  Procedure Laterality Date   AMPUTATION Left 01/01/2018   Procedure: REVISION AMPUTATION RING FINGER;  Surgeon: Shari Easter,  MD;  Location: [facility] OR;  Service: Orthopedics;  Laterality: Left;   CARPAL TUNNEL RELEASE Left 11/14/2021   Procedure: LEFT CARPAL TUNNEL RELEASE;  Surgeon: Murrell Drivers, MD;  Location: [facility];  Service: Orthopedics;  Laterality: Left;  30 MIN   CHOLECYSTECTOMY N/A 06/22/2023   Procedure: LAPAROSCOPIC CHOLECYSTECTOMY WITH ICG DYE;  Surgeon: Vanderbilt Ned, MD;  Location: [facility] OR;  Service: General;  Laterality: N/A;   EYE SURGERY Right 1980's   H/O right orbital blowout fracture   FOOT SURGERY Left    [facility] Neuroma   GASTROSTOMY TUBE PLACEMENT     IR GASTROSTOMY TUBE REMOVAL     IR GENERIC HISTORICAL  10/12/2016   IR REMOVAL TUN ACCESS W/ PORT W/O FL MOD SED 10/12/2016 Ami Bellman, DO [facility]   KNEE SURGERY Right    Arthroscopic   PORTA CATH INSERTION     RIB RESECTION Left 05/31/2023   Procedure: LEFT FIRST RIB EXCISION;  Surgeon: Magda Ned SAILOR, MD;  Location: [facility] OR;  Service: Vascular;  Laterality: Left;   VENOGRAM N/A 05/31/2023   Procedure: LEFT UPPER EXTREMITY AND CENTRAL VENOGRAM;  Surgeon: Magda Debby SAILOR, MD;  Location: [facility] OR;  Service: Vascular;  Laterality: N/A;    SOCIAL HISTORY: He reports that he has never smoked. He quit smokeless tobacco use about 17 years ago.  His smokeless tobacco use included Jenkins. He reports current alcohol use. He reports that he does not use drugs. Social History   Socioeconomic History   Marital status: Married    Spouse name: Not on file   Number of children: 3   Years of education: Not on file   Highest education level: Not on file  Occupational History   Not on file  Tobacco Use   Smoking status: Never   Smokeless tobacco: Former    Types: Jenkins    Quit date: 11/20/2006  Vaping Use   Vaping status: Never Used  Substance and Sexual Activity   Alcohol use: Yes    Alcohol/week: 0.0 standard drinks of alcohol    Comment: He reports he is a social drinker   Drug use: No   Sexual activity: Not on file  Other  Topics Concern   Not on file  Social History Narrative   Not on file   Social Drivers of Health   Financial Resource Strain: Not on file  Food Insecurity: No Food Insecurity (07/12/2024)   Hunger Vital Sign    Worried About Running Out of Food in the Last Year: Never true    Ran Out of Food in the Last Year: Never true  Transportation Needs: No Transportation Needs (07/12/2024)   PRAPARE - Administrator, Civil Service (Medical): No    Lack of Transportation (Non-Medical): No  Physical Activity: Not on file  Stress: Not on file  Social Connections: Not on file  Intimate Partner Violence: Not At Risk (07/12/2024)   Humiliation, Afraid, Rape, and Kick questionnaire    Fear of Current or Ex-Partner: No    Emotionally Abused: No    Physically Abused: No    Sexually Abused: No    FAMILY HISTORY: Family History  Problem Relation Age of Onset   COPD Mother    Cancer Father        esophageal ca   Lung cancer Brother    Cancer Maternal Aunt        breast ca   Cancer Maternal Grandmother        brain ca   Cancer Maternal Grandfather        lung ca    ALLERGIES:  He has no known allergies.  MEDICATIONS:  Current Outpatient Medications  Medication Sig Dispense Refill   acetaminophen  (TYLENOL ) 500 MG tablet Take 1,000 mg by mouth every 6 (six) hours as needed for moderate pain.     aspirin  EC 81 MG tablet Take 81 mg by mouth daily.     atorvastatin  (LIPITOR) 10 MG tablet Take 10 mg by mouth daily.     cyanocobalamin (VITAMIN B12) 1000 MCG/ML injection Inject 1,000 mcg into the muscle every 30 (thirty) days.     ipratropium (ATROVENT) 0.06 % nasal spray Place 2 sprays into both nostrils daily as needed for rhinitis.     levothyroxine  (SYNTHROID ) 125 MCG tablet Take 125 mcg by mouth daily before breakfast.  3   No current facility-administered medications for this visit.    REVIEW OF SYSTEMS:    Review of Systems - Oncology  All other pertinent systems were  reviewed with the patient and are negative.  PHYSICAL EXAMINATION:    Onc Performance Status - 07/12/24 1513       ECOG Perf Status   ECOG Perf Status Fully active, able to carry on all pre-disease performance without restriction      KPS SCALE   KPS % SCORE Normal, no compliants, no evidence of disease          Vitals:   07/12/24 1454  BP: 110/60  Pulse: 62  Resp: 17  Temp: 98.1 F (36.7 C)  SpO2: 98%   Filed Weights   07/12/24 1454  Weight: 169 lb 3.2 oz (76.7 kg)    Physical Exam Constitutional:      General: He is not in acute distress.    Appearance: Normal appearance.  HENT:     Head: Normocephalic and atraumatic.     Mouth/Throat:     Comments: Tumor arising from the majority of the right tonsil, with the edge starting to abut the uvula. Tongue midline. No trismus Eyes:     Conjunctiva/sclera: Conjunctivae normal.  Neck:     Comments: Post radiation therapy fibrosis noted in the neck area, well-healed.  No definite palpable cervical lymphadenopathy Cardiovascular:     Rate and Rhythm: Normal rate and regular rhythm.     Heart sounds: Normal heart sounds.  Pulmonary:     Effort: Pulmonary effort is normal. No respiratory distress.     Breath sounds: Normal breath sounds.  Abdominal:     General: There is no distension.  Lymphadenopathy:     Cervical: No cervical adenopathy.  Neurological:     General: No focal deficit present.     Mental Status: He is alert and oriented to person, place, and time.  Psychiatric:        Mood and Affect: Mood normal.        Behavior: Behavior normal.       LABORATORY DATA:   I have reviewed the data as listed.  Results for orders placed or performed in visit on 07/12/24  CMP (Cancer Center only)  Result Value Ref Range   Sodium 142 135 - 145 mmol/L   Potassium 4.6 3.5 - 5.1 mmol/L   Chloride 105 98 - 111 mmol/L   CO2 33 (H) 22 - 32 mmol/L   Glucose, Bld 97 70 - 99 mg/dL   BUN 16 8 - 23 mg/dL    Creatinine 9.09 9.38 - 1.24 mg/dL   Calcium  9.9 8.9 - 10.3 mg/dL   Total Protein 7.3 6.5 - 8.1 g/dL   Albumin 4.3 3.5 - 5.0 g/dL   AST 14 (L) 15 - 41 U/L   ALT 13 0 - 44 U/L   Alkaline Phosphatase 72 38 - 126 U/L   Total Bilirubin 0.4 0.0 - 1.2 mg/dL   GFR, Estimated >39 >39 mL/min   Anion gap 4 (L) 5 - 15  CBC with Differential (Cancer Center Only)  Result Value Ref Range   WBC Count 6.8 4.0 - 10.5 K/uL   RBC 4.27 4.22 - 5.81 MIL/uL   Hemoglobin 13.5 13.0 - 17.0 g/dL   HCT 59.2 60.9 - 47.9 %   MCV 95.3 80.0 - 100.0 fL   MCH 31.6 26.0 - 34.0 pg   MCHC 33.2 30.0 - 36.0 g/dL   RDW 87.0 88.4 - 84.4 %   Platelet Count 199 150 - 400 K/uL   nRBC 0.0 0.0 - 0.2 %   Neutrophils  Relative % 78 %   Neutro Abs 5.3 1.7 - 7.7 K/uL   Lymphocytes Relative 14 %   Lymphs Abs 0.9 0.7 - 4.0 K/uL   Monocytes Relative 7 %   Monocytes Absolute 0.5 0.1 - 1.0 K/uL   Eosinophils Relative 1 %   Eosinophils Absolute 0.1 0.0 - 0.5 K/uL   Basophils Relative 0 %   Basophils Absolute 0.0 0.0 - 0.1 K/uL   Immature Granulocytes 0 %   Abs Immature Granulocytes 0.01 0.00 - 0.07 K/uL    RADIOGRAPHIC STUDIES:  I have personally reviewed the radiological images as listed and agree with the findings in the report.  NM PET Image Restag (PS) Skull Base To Thigh Result Date: 07/06/2024 CLINICAL DATA:  Subsequent treatment strategy for and neck cancer. EXAM: NUCLEAR MEDICINE PET SKULL BASE TO THIGH TECHNIQUE: 7.9 mCi F-18 FDG was injected intravenously. Full-ring PET imaging was performed from the skull base to thigh after the radiotracer. CT data was obtained and used for attenuation correction and anatomic localization. Fasting blood glucose: 84 mg/dl COMPARISON:  PET-CT 11/02/7980 FINDINGS: Mediastinal blood pool activity: SUV max 2.3 Liver activity: SUV max NA NECK: Upper right parotid nodule or lymph node poorly appreciable on the CT data but with maximum SUV 9.9. Hypermetabolic lesion in the expected vicinity of the  right right palatine tonsil with potential extension of by the uvula, maximum SUV 11.1. Incidental CT findings: None. CHEST: No significant abnormal hypermetabolic activity in this region. Incidental CT findings: Aortic and coronary atherosclerosis. Stable 3 mm left upper lobe nodule on image 25 series 7, benign. No further imaging workup of this lesion is indicated. ABDOMEN/PELVIS: No significant abnormal hypermetabolic activity in this region. Incidental CT findings: Cholecystectomy. Iliac atherosclerosis. Stellate 8 mm bladder calculus, image 195 series 4. Prostatomegaly with prominent median lobe indenting the bladder base. SKELETON: No significant abnormal hypermetabolic activity in this region. Incidental CT findings: Bilateral pars defects at L5. Large hemangioma in the right L2 vertebra. IMPRESSION: 1. Hypermetabolic lesion in the expected vicinity of the right palatine tonsil with potential extension of by the uvula, maximum SUV 11.1. This is compatible with malignancy. 2. Hypermetabolic nodule in the upper right parotid gland, maximum SUV 9.9. This could be a primary parotid neoplasm or a metastatic lymph node. 3. No findings of metastatic disease to the chest, abdomen, or pelvis. 4. Stellate 8 mm bladder calculus. 5. Prostatomegaly with prominent median lobe indenting the bladder base. 6. Bilateral pars defects at L5. 7.  Aortic Atherosclerosis (ICD10-I70.0). Electronically Signed   By: Ryan Salvage M.D.   On: 07/06/2024 15:32    Orders Placed This Encounter  Procedures   CBC with Differential (Cancer Center Only)    Standing Status:   Future    Number of Occurrences:   1    Expiration Date:   07/12/2025   CMP (Cancer Center only)    Standing Status:   Future    Number of Occurrences:   1    Expiration Date:   07/12/2025   TSH    Standing Status:   Future    Number of Occurrences:   1    Expiration Date:   07/12/2025    CODE STATUS:  Code Status History     Date Active Date  Inactive Code Status Order ID Comments User Context   06/20/2023 1423 06/23/2023 1934 Full Code [id number]  Waddell Rake, MD ED   05/31/2023 1005 06/02/2023 0101 Full Code [id number]  Magda Debby SAILOR, MD Inpatient  Questions for Most Recent Historical Code Status (Order [id number])     Question Answer   By: Consent: discussion documented in EHR                Advance Directive Documentation    Flowsheet Row Most Recent Value  Type of Advance Directive Healthcare Power of Attorney, Living will  Pre-existing out of facility DNR order (yellow form or pink MOST form) --  MOST Form in Place? --    Future Appointments  Date Time Provider Department Center  07/17/2024  5:00 PM [facility] 1 [facility] [place]  07/20/2024 11:00 AM [facility] CHEMO EDU [facility] None     I spent a total of 70 minutes during this encounter with the patient including review of chart and various tests results, discussions about plan of care and coordination of care plan.  This document was completed utilizing speech recognition software. Grammatical errors, random word insertions, pronoun errors, and incomplete sentences are an occasional consequence of this system due to software limitations, ambient noise, and hardware issues. Any formal questions or concerns about the content, text or information contained within the body of this dictation should be directly addressed to the provider for clarification.

## 2024-07-13 ENCOUNTER — Encounter: Payer: Self-pay | Admitting: Oncology

## 2024-07-13 ENCOUNTER — Other Ambulatory Visit: Payer: Self-pay | Admitting: Pharmacist

## 2024-07-13 ENCOUNTER — Other Ambulatory Visit: Payer: Self-pay

## 2024-07-13 DIAGNOSIS — C099 Malignant neoplasm of tonsil, unspecified: Secondary | ICD-10-CM | POA: Insufficient documentation

## 2024-07-13 LAB — TSH: TSH: 0.987 u[IU]/mL (ref 0.350–4.500)

## 2024-07-13 MED ORDER — LIDOCAINE-PRILOCAINE 2.5-2.5 % EX CREA: TOPICAL_CREAM | CUTANEOUS | 3 refills | Status: DC

## 2024-07-13 MED ORDER — ONDANSETRON HCL 8 MG PO TABS: 8.0000 mg | ORAL_TABLET | Freq: Three times a day (TID) | ORAL | 1 refills | Status: DC | PRN

## 2024-07-13 MED ORDER — PROCHLORPERAZINE MALEATE 10 MG PO TABS: 10.0000 mg | ORAL_TABLET | Freq: Four times a day (QID) | ORAL | 1 refills | Status: DC | PRN

## 2024-07-13 MED ORDER — DEXAMETHASONE 4 MG PO TABS: ORAL_TABLET | ORAL | 1 refills | Status: DC

## 2024-07-13 NOTE — Progress Notes (Signed)
 Oncology Nurse Navigator Documentation    Met with patient during initial consult with Dr. Autumn.  He was accompanied by his wife, Landry. Further introduced myself as his/their Navigator, explained my role as a member of the Care Team. Provided New Patient resource guide binder: Contact information for physicians, this navigator, other members of the Care Team Advance Directive information; provided [facility] AD booklet at their request,  Fall Prevention Patient Safety Plan Financial Assistance Information sheet Symptom Management Clinic information [facility] campus map with highlight of [facility] SLP Information sheet Head and Neck cancer basics Nutrition information Patient and family support information including Spiritual care/Chaplain information, Peer mentor program, health and wellness classes, and the survivorship program Community resources  Provided and discussed educational handouts for PEG and PAC. Assisted with post-consult appt scheduling. They are aware that they will receive a call from Dr. Nila office with [facility] to schedule an appointment.    They verbalized understanding of information provided. I encouraged them to call with questions/concerns moving forward.  Delon Jefferson, RN, BSN, OCN Head & Neck Oncology Nurse Navigator [facility] at [place] [phone]

## 2024-07-13 NOTE — Progress Notes (Unsigned)
 [name] LABOR, MD  Gregory Jenkins PROCEDURE / BIOPSY REVIEW Date: 07/13/24  Requested Biopsy site: right parotid Reason for request: parotid lesion, FDG avid Imaging review: Best seen on PET  Decision: Approved Imaging modality to perform: Ultrasound Schedule with: No sedation / Local anesthetic Schedule for: Any VIR  Additional comments: @Schedulers .  Please contact me with questions, concerns, or if issue pertaining to this request arise.  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

## 2024-07-13 NOTE — Progress Notes (Signed)
 START ON PATHWAY REGIMEN - Head and Neck     A cycle is every 7 days:     Cisplatin   **Always confirm dose/schedule in your pharmacy ordering system**  Patient Characteristics: Oropharynx, HPV Negative/Unknown, Preoperative or Nonsurgical Candidate (Clinical Staging), Stage III, Not Eligible for Surgery Disease Classification: Oropharynx HPV Status: Negative (-) Therapeutic Status: Preoperative or Nonsurgical Candidate (Clinical Staging) AJCC T Category: cT2 AJCC 8 Stage Grouping: Unknown AJCC N Category: cNX AJCC M Category: cM0 Surgical Candidacy: Not Eligible for Surgery Intent of Therapy: Curative Intent, Discussed with Patient

## 2024-07-13 NOTE — Addendum Note (Signed)
 Addended by: Skyylar Kopf on: 07/13/2024 10:33 AM   Modules accepted: Orders

## 2024-07-13 NOTE — Assessment & Plan Note (Addendum)
 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  He continued to have follow-ups up until 2022 with no evidence of disease clinically and on imaging.  Previously treated by Dr. Lonn in our clinic.  Now with a recurrence on the right side, p16 negative.  No evidence of metastatic disease on PET scan.  Nonspecific parotid lesion versus lymph node, pending further evaluation with biopsy.  He was seen in consultation by Dr. Lauralee (surgical ENT) on 06/28/24 to discuss surgical treatment options. Although surgery would likely provide him with the best outcome, Dr. 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 In this setting, Dr. Lauralee would like him to be considered for re-irradiation.   Patient did receive radiation to the bilateral cervical lymph nodes in the past.  Dr. Izell evaluated the patient and plan is for reirradiation for current recurrence.  His case was discussed in our ENT tumor conference on 07/12/2024.  Consensus opinion is to proceed with concurrent chemoradiation with cisplatin.  We have discussed about role of cisplatin being a radiosensitizer in the treatment of head and neck cancer.  We have discussed about the curative intent of chemoradiation for this patient.     We have discussed about mechanism of action of cisplatin, adverse effects of cisplatin including but not limited to fatigue, nausea, vomiting, increased risk of infections, mucositis, ototoxicity, nephrotoxicity, peripheral neuropathy.  Patient understands that some of the side effects can be permanent and even potentially fatal.  We have discussed about role of Mediport and G-tube for chemotherapy administration and nutrition respectively since most of these patients have severe mucositis during the treatment.  At this time we do not know if weekly cisplatin is inferior  to every 21 days cisplatin since there is no head-to-head comparison trial. I did mention to the patient however weekly cisplatin is well-tolerated with less adverse effects and most of the patients tend to complete treatment as planned.  Patient is willing to proceed with weekly cisplatin.  Goal is to avoid surgery and achieve remission. Weekly administration allows dose adjustments based on tolerance and side effects, aiming for a less severe experience compared to high-dose regimens.  -He is scheduled for schedule MRI of neck on Monday, 07/17/2024.  -He was also referred to IR for biopsy of the parotid lesion.  - We will arrange for chemotherapy education in the meantime and Port-A-Cath placement.  Plan is to begin chemotherapy with cisplatin during the same week as radiation start.  - Monitor kidney function and blood counts weekly - Ensure hydration with 70-80 ounces of fluid daily - Prescribed antiemetics for nausea management  - Pre-schedule feeding tube placement for 5th or 6th week of treatment if needed  I will plan to see him around the same day that he is scheduled to begin radiation.

## 2024-07-14 ENCOUNTER — Other Ambulatory Visit: Payer: Self-pay

## 2024-07-17 ENCOUNTER — Other Ambulatory Visit: Payer: Self-pay | Admitting: Radiology

## 2024-07-17 ENCOUNTER — Encounter: Payer: Self-pay | Admitting: Oncology

## 2024-07-17 ENCOUNTER — Other Ambulatory Visit: Payer: Self-pay | Admitting: Oncology

## 2024-07-17 ENCOUNTER — Ambulatory Visit (HOSPITAL_COMMUNITY)
Admission: RE | Admit: 2024-07-17 | Discharge: 2024-07-17 | Disposition: A | Discharge status: Home / Self Care | Source: Ambulatory Visit | Attending: Radiation Oncology | Admitting: Radiation Oncology

## 2024-07-17 DIAGNOSIS — C090 Malignant neoplasm of tonsillar fossa: Secondary | ICD-10-CM | POA: Insufficient documentation

## 2024-07-17 DIAGNOSIS — C099 Malignant neoplasm of tonsil, unspecified: Secondary | ICD-10-CM

## 2024-07-17 MED ORDER — GADOBUTROL 1 MMOL/ML IV SOLN
7.5000 mL | Freq: Once | INTRAVENOUS | Status: AC | PRN
  Administered 2024-07-17: 7.5 mL via INTRAVENOUS

## 2024-07-18 ENCOUNTER — Other Ambulatory Visit: Payer: Self-pay

## 2024-07-18 ENCOUNTER — Ambulatory Visit
Admission: RE | Admit: 2024-07-18 | Discharge: 2024-07-18 | Disposition: A | Discharge status: Home / Self Care | Source: Ambulatory Visit | Attending: Radiation Oncology | Admitting: Radiation Oncology

## 2024-07-18 VITALS — BP 128/75 | HR 64 | Temp 97.7°F | Resp 18 | Ht 71.0 in | Wt 164.6 lb

## 2024-07-18 DIAGNOSIS — C099 Malignant neoplasm of tonsil, unspecified: Secondary | ICD-10-CM

## 2024-07-18 DIAGNOSIS — Z87891 Personal history of nicotine dependence: Secondary | ICD-10-CM | POA: Insufficient documentation

## 2024-07-18 DIAGNOSIS — C770 Secondary and unspecified malignant neoplasm of lymph nodes of head, face and neck: Secondary | ICD-10-CM | POA: Diagnosis present

## 2024-07-18 DIAGNOSIS — C090 Malignant neoplasm of tonsillar fossa: Secondary | ICD-10-CM

## 2024-07-18 NOTE — H&P (Signed)
 Chief Complaint: Recently diagnosed right tonsillar cancer; referred for Port-A-Cath placement to assist with treatment.  Referring Provider(s): Pasam,A  Supervising Physician: Hughes Simmonds  Patient Status:[facility]  History of Present Illness: Gregory Jenkins is a 73 y.o. 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  He is scheduled today for Port-A-Cath placement to assist with treatment.  He is known to IR team from prior Port-A-Cath and gastrostomy tube placements in 2017.  *** Patient is Full Code  Past Medical History:  Diagnosis Date   Abdominal pain 03/19/2016   Allergic rhinitis    Cancer of tonsillar fossa (HCC)    left   Clotting disorder    PE, suspect lupus anticoagulant   Drug-induced skin rash 12/25/2015   History of kidney stones 2000   History of nephrolithiasis    History of pulmonary embolism    History of radiation therapy 12/17/2015- 02/03/16   Left Tonsil and Bilateral Neck   Hypothyroidism    Mucositis oral 01/08/2016   Sleep apnea 2000   Thyroid  disease    Tonsil cancer [facility])     Past Surgical History:  Procedure Laterality Date   AMPUTATION Left 01/01/2018   Procedure: REVISION AMPUTATION RING FINGER;  Surgeon: Shari Easter, MD;  Location: [facility] OR;  Service: Orthopedics;  Laterality: Left;   CARPAL TUNNEL RELEASE Left 11/14/2021   Procedure: LEFT CARPAL TUNNEL RELEASE;  Surgeon: Murrell Drivers, MD;  Location: [facility];  Service: Orthopedics;  Laterality: Left;  30 MIN   CHOLECYSTECTOMY N/A 06/22/2023   Procedure: LAPAROSCOPIC CHOLECYSTECTOMY WITH ICG DYE;  Surgeon: Vanderbilt Ned, MD;  Location: [facility] OR;  Service: General;  Laterality: N/A;   EYE SURGERY Right 1980's   H/O right orbital blowout fracture   FOOT SURGERY Left     [facility] Neuroma   GASTROSTOMY TUBE PLACEMENT     IR GASTROSTOMY TUBE REMOVAL     IR GENERIC HISTORICAL  10/12/2016   IR REMOVAL TUN ACCESS W/ PORT W/O FL MOD SED 10/12/2016 Ami Bellman, DO [facility]   KNEE SURGERY Right    Arthroscopic   PORTA CATH INSERTION     RIB RESECTION Left 05/31/2023   Procedure: LEFT FIRST RIB EXCISION;  Surgeon: Magda Ned SAILOR, MD;  Location[facility] OR;  Service: Vascular;  Laterality: Left;   VENOGRAM N/A 05/31/2023   Procedure: LEFT UPPER EXTREMITY AND CENTRAL VENOGRAM;  Surgeon: Magda Ned SAILOR, MD;  Location: [facility] OR;  Service: Vascular;  Laterality: N/A;    Allergies: Patient has no known allergies.  Medications: Prior to Admission medications   Medication Sig Start Date End Date Taking? Authorizing Provider  acetaminophen  (TYLENOL ) 500 MG tablet Take 1,000 mg by mouth every 6 (six) hours as needed for moderate pain.    [provider]  aspirin  EC 81 MG tablet Take 81 mg by mouth daily.    [provider]  atorvastatin  (LIPITOR) 10 MG tablet Take 10 mg by mouth daily. 09/24/21   [provider]  cyanocobalamin (VITAMIN B12) 1000 MCG/ML injection Inject 1,000 mcg into the muscle every 30 (thirty) days.    [provider]  dexamethasone  (DECADRON ) 4 MG tablet Take 2 tablets (8 mg) by mouth daily x 3 days starting the day after cisplatin chemotherapy. Take with food. 07/13/24   Pasam, Avinash, MD  ipratropium (ATROVENT) 0.06 %  nasal spray Place 2 sprays into both nostrils daily as needed for rhinitis.    [provider]  levothyroxine  (SYNTHROID ) 125 MCG tablet Take 125 mcg by mouth daily before breakfast. 01/15/18   [provider]  lidocaine -prilocaine  (EMLA ) cream Apply to affected area once 07/13/24   Pasam, Avinash, MD  ondansetron  (ZOFRAN ) 8 MG tablet Take 1 tablet (8 mg total) by mouth every 8 (eight) hours as needed for nausea or vomiting. Start on the third day after cisplatin. 07/13/24   Pasam,  Chinita, MD  prochlorperazine  (COMPAZINE ) 10 MG tablet Take 1 tablet (10 mg total) by mouth every 6 (six) hours as needed (Nausea or vomiting). 07/13/24   Pasam, Chinita, MD     Family History  Problem Relation Age of Onset   COPD Mother    Cancer Father        esophageal ca   Lung cancer Brother    Cancer Maternal Aunt        breast ca   Cancer Maternal Grandmother        brain ca   Cancer Maternal Grandfather        lung ca    Social History   Socioeconomic History   Marital status: Married    Spouse name: Not on file   Number of children: 3   Years of education: Not on file   Highest education level: Not on file  Occupational History   Not on file  Tobacco Use   Smoking status: Never   Smokeless tobacco: Former    Types: Chew    Quit date: 11/20/2006  Vaping Use   Vaping status: Never Used  Substance and Sexual Activity   Alcohol use: Yes    Alcohol/week: 0.0 standard drinks of alcohol    Comment: He reports he is a social drinker   Drug use: No   Sexual activity: Not on file  Other Topics Concern   Not on file  Social History Narrative   Not on file   Social Drivers of Health   Financial Resource Strain: Not on file  Food Insecurity: No Food Insecurity (07/12/2024)   Hunger Vital Sign    Worried About Running Out of Food in the Last Year: Never true    Ran Out of Food in the Last Year: Never true  Transportation Needs: No Transportation Needs (07/12/2024)   PRAPARE - Administrator, Civil Service (Medical): No    Lack of Transportation (Non-Medical): No  Physical Activity: Not on file  Stress: Not on file  Social Connections: Not on file       Review of Systems  Vital Signs:   Advance Care Plan: No documents on file.  Physical Exam  Imaging: NM PET Image Restag (PS) Skull Base To Thigh Result Date: 07/06/2024 CLINICAL DATA:  Subsequent treatment strategy for and neck cancer. EXAM: NUCLEAR MEDICINE PET SKULL BASE TO THIGH  TECHNIQUE: 7.9 mCi F-18 FDG was injected intravenously. Full-ring PET imaging was performed from the skull base to thigh after the radiotracer. CT data was obtained and used for attenuation correction and anatomic localization. Fasting blood glucose: 84 mg/dl COMPARISON:  PET-CT 11/02/7980 FINDINGS: Mediastinal blood pool activity: SUV max 2.3 Liver activity: SUV max NA NECK: Upper right parotid nodule or lymph node poorly appreciable on the CT data but with maximum SUV 9.9. Hypermetabolic lesion in the expected vicinity of the right right palatine tonsil with potential extension of by the uvula, maximum SUV 11.1. Incidental CT findings:  None. CHEST: No significant abnormal hypermetabolic activity in this region. Incidental CT findings: Aortic and coronary atherosclerosis. Stable 3 mm left upper lobe nodule on image 25 series 7, benign. No further imaging workup of this lesion is indicated. ABDOMEN/PELVIS: No significant abnormal hypermetabolic activity in this region. Incidental CT findings: Cholecystectomy. Iliac atherosclerosis. Stellate 8 mm bladder calculus, image 195 series 4. Prostatomegaly with prominent median lobe indenting the bladder base. SKELETON: No significant abnormal hypermetabolic activity in this region. Incidental CT findings: Bilateral pars defects at L5. Large hemangioma in the right L2 vertebra. IMPRESSION: 1. Hypermetabolic lesion in the expected vicinity of the right palatine tonsil with potential extension of by the uvula, maximum SUV 11.1. This is compatible with malignancy. 2. Hypermetabolic nodule in the upper right parotid gland, maximum SUV 9.9. This could be a primary parotid neoplasm or a metastatic lymph node. 3. No findings of metastatic disease to the chest, abdomen, or pelvis. 4. Stellate 8 mm bladder calculus. 5. Prostatomegaly with prominent median lobe indenting the bladder base. 6. Bilateral pars defects at L5. 7.  Aortic Atherosclerosis (ICD10-I70.0). Electronically Signed    By: Ryan Salvage M.D.   On: 07/06/2024 15:32    Labs:  CBC: Recent Labs    07/12/24 1549  WBC 6.8  HGB 13.5  HCT 40.7  PLT 199    COAGS: No results for input(s): INR, APTT in the last 8760 hours.  BMP: Recent Labs    07/12/24 1549  NA 142  K 4.6  CL 105  CO2 33*  GLUCOSE 97  BUN 16  CALCIUM  9.9  CREATININE 0.90  GFRNONAA >60    LIVER FUNCTION TESTS: Recent Labs    07/12/24 1549  BILITOT 0.4  AST 14*  ALT 13  ALKPHOS 72  PROT 7.3  ALBUMIN 4.3    TUMOR MARKERS: No results for input(s): AFPTM, CEA, CA199, CHROMGRNA in the last 8760 hours.  Assessment and Plan: 73 y.o. 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  He is scheduled today for Port-A-Cath placement to assist with treatment.  He is known to IR team from prior Port-A-Cath and gastrostomy tube placements in 2017.Risks and benefits of image guided port-a-catheter placement was discussed with the patient including, but not limited to bleeding, infection, pneumothorax, or fibrin sheath development and need for additional procedures.  All of the patient's questions were answered, patient is agreeable to proceed. Consent signed and in chart.  Patient also has a hypermetabolic nodule in the upper right parotid gland and is scheduled for image guided biopsy on 9/29.   Thank you for allowing our service to participate in Gregory Jenkins 's care.  Electronically Signed: D. Franky Rakers, PA-C   07/18/2024, 5:26 PM      I spent a total of  20 minutes   in face to face in clinical consultation, greater than 50% of which was counseling/coordinating care for port a cath placement

## 2024-07-18 NOTE — Progress Notes (Signed)
 Oncology Nurse Navigator Documentation   To provide support, encouragement and care continuity, met with Mr. Gregory Jenkins before his CT SIM. He was accompanied by his wife, Gregory Jenkins. He tolerated procedure without difficulty, denied questions/concerns.   I encouraged him to call me prior to his 10/8 New Start.   Delon Jefferson RN, BSN, OCN Head & Neck Oncology Nurse Navigator [facility] at [facility] Phone # [phone]  Fax # [phone]

## 2024-07-18 NOTE — Progress Notes (Signed)
 Has armband been applied?  Yes.    Does patient have an allergy to IV contrast dye?: No.   Has patient ever received premedication for IV contrast dye?: No.   Date of lab work: 07/12/2024 BUN: 16 CR: 0.90 eGFR: >60  Does patient take metformin?: No.  Is eGFR >60?: Yes.   If no, when can patient resume? (Must be 48 hrs AFTER they receive IV contrast):    IV site: antecubital right, condition patent and no redness  Has IV site been added to flowsheet?  Yes.    BP 128/75 (BP Location: Right Arm, Patient Position: Sitting, Cuff Size: Normal)   Pulse 64   Temp 97.7 F (36.5 C)   Resp 18   Ht 5' 11 (1.803 m)   Wt 164 lb 9.6 oz (74.7 kg)   SpO2 99%   BMI 22.96 kg/m

## 2024-07-18 NOTE — Progress Notes (Incomplete)
 Has armband been applied?  {yes no:314532}  Does patient have an allergy to IV contrast dye?: {yes no:314532}   Has patient ever received premedication for IV contrast dye?: {yes no:314532}   Date of lab work: 07/12/2024 BUN: 16 CR: 0.90 eGFR: >60  Does patient take metformin?: {yes no:314532}  Is eGFR >60?: {yes no:314532} If no, when can patient resume? (Must be 48 hrs AFTER they receive IV contrast):  {Time; dates multiple:15870}  IV site: {iv locations:314275}  Has IV site been added to flowsheet?  {yes no:314532}  There were no vitals taken for this visit.

## 2024-07-19 ENCOUNTER — Other Ambulatory Visit: Payer: Self-pay | Admitting: Oncology

## 2024-07-19 ENCOUNTER — Other Ambulatory Visit: Payer: Self-pay

## 2024-07-19 ENCOUNTER — Encounter (HOSPITAL_COMMUNITY): Payer: Self-pay

## 2024-07-19 ENCOUNTER — Ambulatory Visit (HOSPITAL_COMMUNITY)
Admission: RE | Admit: 2024-07-19 | Discharge: 2024-07-19 | Disposition: A | Discharge status: Home / Self Care | Source: Ambulatory Visit | Attending: Oncology

## 2024-07-19 ENCOUNTER — Ambulatory Visit (HOSPITAL_COMMUNITY)
Admission: RE | Admit: 2024-07-19 | Discharge: 2024-07-19 | Disposition: A | Discharge status: Home / Self Care | Source: Ambulatory Visit | Attending: Oncology | Admitting: Oncology

## 2024-07-19 DIAGNOSIS — Z87891 Personal history of nicotine dependence: Secondary | ICD-10-CM | POA: Insufficient documentation

## 2024-07-19 DIAGNOSIS — C099 Malignant neoplasm of tonsil, unspecified: Secondary | ICD-10-CM | POA: Insufficient documentation

## 2024-07-19 HISTORY — PX: IR IMAGING GUIDED PORT INSERTION: IMG5740

## 2024-07-19 MED ORDER — MIDAZOLAM HCL 2 MG/2ML IJ SOLN
INTRAMUSCULAR | Status: AC | PRN
  Administered 2024-07-19: 1 mg via INTRAVENOUS

## 2024-07-19 MED ORDER — FENTANYL CITRATE (PF) 100 MCG/2ML IJ SOLN
INTRAMUSCULAR | Status: AC | PRN
  Administered 2024-07-19: 50 ug via INTRAVENOUS

## 2024-07-19 MED ORDER — FENTANYL CITRATE (PF) 100 MCG/2ML IJ SOLN
INTRAMUSCULAR | Status: AC
  Filled 2024-07-19: qty 2

## 2024-07-19 MED ORDER — SODIUM CHLORIDE 0.9 % IV SOLN: INTRAVENOUS | Status: DC

## 2024-07-19 MED ORDER — HEPARIN SOD (PORK) LOCK FLUSH 100 UNIT/ML IV SOLN: 500.0000 [IU] | Freq: Once | INTRAVENOUS | Status: DC

## 2024-07-19 MED ORDER — LIDOCAINE-EPINEPHRINE 1 %-1:100000 IJ SOLN: 20.0000 mL | Freq: Once | INTRAMUSCULAR | Status: DC

## 2024-07-19 MED ORDER — MIDAZOLAM HCL 2 MG/2ML IJ SOLN
INTRAMUSCULAR | Status: AC
  Filled 2024-07-19: qty 2

## 2024-07-19 MED ORDER — HEPARIN SOD (PORK) LOCK FLUSH 100 UNIT/ML IV SOLN
INTRAVENOUS | Status: AC
  Filled 2024-07-19: qty 5

## 2024-07-19 MED ORDER — LIDOCAINE-EPINEPHRINE 1 %-1:100000 IJ SOLN
INTRAMUSCULAR | Status: AC
  Filled 2024-07-19: qty 1

## 2024-07-19 NOTE — Discharge Instructions (Signed)

## 2024-07-19 NOTE — Procedures (Signed)
 Vascular and Interventional Radiology Procedure Note  Patient: Gregory Jenkins DOB: [birth year] Medical Record Number: [id number] Note Date/Time: 07/19/24 3:43 PM   Performing Physician: Thom Hall, MD Assistant(s): None  Diagnosis: Head and Neck cancer  Procedure: PORT PLACEMENT  Anesthesia: Conscious Sedation Complications: None Estimated Blood Loss: Minimal  Findings:  Successful right-sided port placement, with the tip of the catheter in the proximal right atrium.  Plan: Catheter ready for use.  See detailed procedure note with images in PACS. The patient tolerated the procedure well without incident or complication and was returned to Recovery in stable condition.    Thom Hall, MD Vascular and Interventional Radiology Specialists [facility]   Pager. [phone] Clinic. [phone]

## 2024-07-19 NOTE — Sedation Documentation (Signed)
 RN Jacquez Sheetz pulled 2 mg Versed  and 100 mcg Fentanyl  in Ir room. Pt. Received 2 mg Versed  and 100 mcg Fentanyl  throughout the procedure.

## 2024-07-20 ENCOUNTER — Inpatient Hospital Stay

## 2024-07-21 NOTE — Progress Notes (Signed)
 Patient for US  guided Core RT Parotid gland biopsy on Monday 07/24/24, I called and spoke with the patient's wife, Gregory Jenkins on the phone and gave pre-procedure instructions. Beth was made aware to have the patient here at 12:30p and check in at the [facility] [employer]. Beth stated understanding. Called 07/21/24

## 2024-07-24 ENCOUNTER — Ambulatory Visit
Admission: RE | Admit: 2024-07-24 | Discharge: 2024-07-24 | Disposition: A | Source: Ambulatory Visit | Attending: Radiation Oncology | Admitting: Radiation Oncology

## 2024-07-24 DIAGNOSIS — D37030 Neoplasm of uncertain behavior of the parotid salivary glands: Secondary | ICD-10-CM | POA: Insufficient documentation

## 2024-07-24 DIAGNOSIS — Z85818 Personal history of malignant neoplasm of other sites of lip, oral cavity, and pharynx: Secondary | ICD-10-CM | POA: Diagnosis not present

## 2024-07-24 DIAGNOSIS — K118 Other diseases of salivary glands: Secondary | ICD-10-CM | POA: Diagnosis present

## 2024-07-24 MED ORDER — LIDOCAINE HCL (PF) 1 % IJ SOLN
5.0000 mL | Freq: Once | INTRAMUSCULAR | Status: AC
  Administered 2024-07-24: 5 mL via INTRADERMAL

## 2024-07-24 NOTE — Procedures (Signed)
 Interventional Radiology Procedure Note  Procedure: US  guided core biopsy or RIGHT parotid lesion  Complications: None  Estimated Blood Loss: None  Recommendations: - DC home   Signed,  Wilkie LOIS Lent, MD

## 2024-07-24 NOTE — Progress Notes (Signed)
 Oncology Nurse Navigator Documentation   At Dr. Charisse request I have left a voicemail with Dr. Carlie' assistant, Euna asking for Mr. Coen to be scheduled with Dr. Carlie today, tomorrow, or Wednesday for examination of a lesion seen on a recent MRI before planning for upcoming radiation. I left my direct number with a request to call me back with an appointment date/time.   Delon Jefferson RN, BSN, OCN Head & Neck Oncology Nurse Navigator [facility] at [facility] Phone # [phone]  Fax # [phone]

## 2024-07-25 LAB — SURGICAL PATHOLOGY

## 2024-07-26 NOTE — Therapy (Signed)
 OUTPATIENT SPEECH LANGUAGE PATHOLOGY ONCOLOGY EVALUATION   Patient Name: Gregory Jenkins MRN: [id number] DOB:10/15/1951, 73 y.o., male Today's Date: 07/27/2024  PCP: Marvene Barter, MD REFERRING PROVIDER: Izell Domino, MD  END OF SESSION:  End of Session - 07/27/24 1000     Visit Number 1    Number of Visits 3    Date for Recertification  10/25/24    SLP Start Time 0855    SLP Stop Time  0945    SLP Time Calculation (min) 50 min    Activity Tolerance Patient tolerated treatment well          Past Medical History:  Diagnosis Date   Abdominal pain 03/19/2016   Allergic rhinitis    Cancer of tonsillar fossa (HCC)    left   Clotting disorder    PE, suspect lupus anticoagulant   Drug-induced skin rash 12/25/2015   History of kidney stones 2000   History of nephrolithiasis    History of pulmonary embolism    History of radiation therapy 12/17/2015- 02/03/16   Left Tonsil and Bilateral Neck   Hypothyroidism    Mucositis oral 01/08/2016   Sleep apnea 2000   Thyroid  disease    Tonsil cancer [facility])    Past Surgical History:  Procedure Laterality Date   AMPUTATION Left 01/01/2018   Procedure: REVISION AMPUTATION RING FINGER;  Surgeon: Shari Easter, MD;  Location: [facility] OR;  Service: Orthopedics;  Laterality: Left;   CARPAL TUNNEL RELEASE Left 11/14/2021   Procedure: LEFT CARPAL TUNNEL RELEASE;  Surgeon: Murrell Drivers, MD;  Location: [facility];  Service: Orthopedics;  Laterality: Left;  30 MIN   CHOLECYSTECTOMY N/A 06/22/2023   Procedure: LAPAROSCOPIC CHOLECYSTECTOMY WITH ICG DYE;  Surgeon: Vanderbilt Ned, MD;  Location: [facility] OR;  Service: General;  Laterality: N/A;   EYE SURGERY Right 1980's   H/O right orbital blowout fracture   FOOT SURGERY Left    [place] Neuroma   GASTROSTOMY TUBE PLACEMENT     IR GASTROSTOMY TUBE REMOVAL     IR GENERIC HISTORICAL  10/12/2016   IR REMOVAL TUN ACCESS W/ PORT W/O FL MOD SED 10/12/2016 Ami Bellman, DO [facility]-INTERV RAD   IR  IMAGING GUIDED PORT INSERTION  07/19/2024   KNEE SURGERY Right    Arthroscopic   PORTA CATH INSERTION     RIB RESECTION Left 05/31/2023   Procedure: LEFT FIRST RIB EXCISION;  Surgeon: Magda Ned SAILOR, MD;  Location: [facility] OR;  Service: Vascular;  Laterality: Left;   VENOGRAM N/A 05/31/2023   Procedure: LEFT UPPER EXTREMITY AND CENTRAL VENOGRAM;  Surgeon: Magda Ned SAILOR, MD;  Location: [facility] OR;  Service: Vascular;  Laterality: N/A;   Patient Active Problem List   Diagnosis Date Noted   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  Rationale for Evaluation and Treatment: Rehabilitation  SUBJECTIVE:   SUBJECTIVE STATEMENT: Pt denies no out of the ordinary s/sx dysphagia.   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 Previously diagnosed with stage IVA LEFT tonsil SCC in 2017. He was treated with radiation and immunotherapy at that time to his left tonsil and bilateral neck. He presented to Dr. Izell to discuss the role of radiation therapy in management of his right tonsillar SCC. 06/07/24 He presented to Dr. Carlie with a 3 month history of a sore throat which he characterized as feeling raw on the right side of his throat as well as ear pressure. Oral exam noted a exudative mass of the right tonsillar fossa. Biopsy obtained at that time revealed invasive SCC, p 16 -. 06/19/24 CT neck  demonstrated the right palatine tonsillar mass extending onto the soft palate and into the right glossotonsillar sulcus, consistent with SCC, measuring at least 26 mm. No abnormal lymph nodes were demonstrated in the neck. A chest CT was also performed that same day which showed several indeterminate punctate pulmonary nodules, but no definite evidence of pulmonary metastatic disease. 06/28/24 He saw Dr. Lauralee to discuss surgical options. Although surgery would likely provide him with the best outcome, Dr. 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 In this setting, Dr. Lauralee would like him to be considered for re-irradiation which we will discuss in detail today. He has also been referred back to medical oncology and will meet with Dr. Autumn on 07/12/24 to discuss systemic therapy options. 07/04/24 PET demonstrated the hypermetabolic lesion in the vicinity of the right palatine tonsil consistent with malignancy, and with potential extension to the uvula, and a hypermetabolic nodule in the upper right parotid gland with potential differential considerations including primary parotid neoplasm vs a metastatic lymph node noted. Imaging otherwise showed no evidence of metastatic disease to the chest, abdomen, or pelvis. Other findings of potential clinical significance included a bladder calculus and prostatomegaly with a prominent median lobe indenting the bladder base. 07/07/24 Consult with Dr. Izell, 07/12/24 Consult with Dr. Autumn. He will receive radiation/chemotherapy. Treatment plan:  He will receive 33 fractions of radiation to his right tonsil with weekly chemotherapy. He'll start on 08/02/24 and will complete 09/15/24. Pretreatment procedures:07/19/24 PAC, PEG will be scheduled in the 5th/6th week of treatment  PAIN:  Are you having pain? No  FALLS: Has patient fallen in last 6 months?  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  INSTRUMENTAL SWALLOW STUDY FINDINGS (MBSS) none seen in [facility] at this time  COGNITION: Overall cognitive status: Within functional limits for tasks assessed  LANGUAGE: Receptive and Expressive language appeared WNL.  ORAL MOTOR EXAMINATION: Overall status: WFL Comments: Pt's submandibular and bilateral to thyroid  are very hard and fibrotic.  MOTOR SPEECH: Overall motor speech: Appears intact  SUBJECTIVE DYSPHAGIA REPORTS:  Date of onset: last 8 years Reported symptoms: rare difficulty with more dense or particulate food  Current diet: regular and thin liquids  Co-morbid voice changes: No  FACTORS WHICH MAY INCREASE RISK OF ADVERSE EVENT IN PRESENCE OF ASPIRATION:  General health: well appearing  Risk factors: none evident  however will be undergoing ChRT for active cancer  CLINICAL SWALLOW ASSESSMENT:   Dentition: adequate natural dentition Vocal quality at baseline:  normal Patient directly observed with POs: Yes: dysphagia 3 (soft) and thin liquids  Feeding: able to feed self Liquids provided by: cup Oral phase signs and symptoms: prolonged mastication and prolonged bolus formation Pharyngeal phase signs and symptoms: delayed throat clear one swallow of ten                                                                                                                            TREATMENT DATE:   07/27/24: Research states the risk for dysphagia increases due to radiation and/or chemotherapy treatment due to a variety of factors, so SLP educated the pt about the possibility of reduced/limited ability for PO intake during rad tx. SLP also educated pt regarding  possible changes to swallowing musculature after rad tx, and why adherence to dysphagia HEP provided today and PO consumption was necessary to inhibit muscle fibrosis following rad tx and to mitigate muscle disuse atrophy. SLP informed pt why this would be detrimental to their swallowing status and to their pulmonary health. Pt demonstrated understanding of these things to SLP. SLP encouraged pt to safely eat and drink as deep into their radiation/chemotherapy as possible to provide the best possible long-term swallowing outcome for pt.  SLP then developed an individualized HEP for pt involving oral and pharyngeal strengthening and ROM and pt was instructed how to perform these exercises, including SLP demonstration. After SLP demonstration, pt return demonstrated each exercise. SLP ensured pt performance was correct prior to educating pt on next exercise. Pt required usual mod cues faded to modified independent to perform HEP. Pt was instructed to complete this program 6-7 days/week, at least 2 times a day until 6 months after his or her last day of rad tx, and then x2 a week after that, indefinitely. Among other modifications for days when pt cannot functionally swallow, SLP also suggested pt to perform only non-swallowing tasks on the handout/HEP, and if necessary to cycle through the swallowing portion so the full program of exercises can be completed instead of fatiguing on one of the swallowing exercises and being unable to perform the other swallowing exercises. SLP instructed that swallowing exercises should then be added back into the regimen as pt is able to do so.   PATIENT EDUCATION: Education details: late effects head/neck radiation on swallow function, HEP procedure, and modification to HEP when difficulty experienced with swallowing during and after radiation course Person educated: Patient Education method: Explanation, Demonstration, Verbal cues, and Handouts Education comprehension:  verbalized understanding, returned demonstration, verbal cues required, and needs further education   ASSESSMENT:  CLINICAL IMPRESSION: Patient is a 73 y.o. M who was seen today for assessment of swallowing as they undergo radiation/chemoradiation therapy. Today pt ate  [place] sandwich and drank thin liquids without overt s/s oral or pharyngeal difficulty. At this time pt swallowing is deemed WNL/WFL with these POs. No oral or overt s/sx pharyngeal deficits, including aspiration were observed. There are no overt s/s aspiration PNA observed  by SLP nor any reported by pt at this time. Data indicate that pt's swallow ability will likely decrease over the course of radiation/chemoradiation therapy and could very well decline over time following the conclusion of that therapy due to muscle disuse atrophy and/or muscle fibrosis. Pt will cont to need to be seen by SLP in order to assess safety of PO intake, assess the need for recommending any objective swallow assessment, and ensuring pt is correctly completing the individualized HEP.  OBJECTIVE IMPAIRMENTS: include dysphagia. These impairments are limiting patient from safety when swallowing. Factors affecting potential to achieve goals and functional outcome are none noted today. Patient will benefit from skilled SLP services to address above impairments and improve overall function.   REHAB POTENTIAL: Good     GOALS: Goals reviewed with patient? No   SHORT TERM GOALS: Target: 3rd total session   1. Pt will complete HEP with modified independence in 2 sessions Baseline: Goal status: INITIAL   2.  pt will tell SLP why pt is completing HEP with modified independence Baseline:  Goal status: INITIAL   3.  pt will describe 3 overt s/s aspiration PNA with modified independence Baseline:  Goal status: INITIAL   4.  pt will tell SLP how a food journal could hasten return to a more normalized diet Baseline:  Goal status: INITIAL     LONG TERM  GOALS: Target: 7th total session   1.  pt will complete HEP with independence over two visits Baseline:  Goal status: INITIAL   2.  pt will describe how to modify HEP over time, and the timeline associated with reduction in HEP frequency with modified independence over two sessions Baseline:  Goal status: INITIAL     PLAN:   SLP FREQUENCY:  once approx every 4 weeks   SLP DURATION:  7 sessions   PLANNED INTERVENTIONS: Aspiration precaution training, Pharyngeal strengthening exercises, Diet toleration management , Trials of upgraded texture/liquids, SLP instruction and feedback, Compensatory strategies, and Patient/family education, [phone] (treatment of swallowing dysfunction and/or oral function for feeding)   Jatavis Malek, CCC-SLP 07/27/2024, 10:01 AM

## 2024-07-26 NOTE — Therapy (Signed)
 OUTPATIENT PHYSICAL THERAPY HEAD AND NECK BASELINE EVALUATION   Patient Name: BROADY Jenkins MRN: [id number] DOB:[birth year], 73 y.o., male Today's Date: 07/27/2024  END OF SESSION:  PT End of Session - 07/27/24 1016     Visit Number 1    Number of Visits 2    Date for Recertification  10/19/24    PT Start Time 0954    PT Stop Time 1014    PT Time Calculation (min) 20 min    Activity Tolerance Patient tolerated treatment well    Behavior During Therapy [facility] for tasks assessed/performed          Past Medical History:  Diagnosis Date   Abdominal pain 03/19/2016   Allergic rhinitis    Cancer of tonsillar fossa (HCC)    left   Clotting disorder    PE, suspect lupus anticoagulant   Drug-induced skin rash 12/25/2015   History of kidney stones 2000   History of nephrolithiasis    History of pulmonary embolism    History of radiation therapy 12/17/2015- 02/03/16   Left Tonsil and Bilateral Neck   Hypothyroidism    Mucositis oral 01/08/2016   Sleep apnea 2000   Thyroid  disease    Tonsil cancer [facility])    Past Surgical History:  Procedure Laterality Date   AMPUTATION Left 01/01/2018   Procedure: REVISION AMPUTATION RING FINGER;  Surgeon: Shari Easter, MD;  Location: [facility] OR;  Service: Orthopedics;  Laterality: Left;   CARPAL TUNNEL RELEASE Left 11/14/2021   Procedure: LEFT CARPAL TUNNEL RELEASE;  Surgeon: Murrell Drivers, MD;  Location: [facility];  Service: Orthopedics;  Laterality: Left;  30 MIN   CHOLECYSTECTOMY N/A 06/22/2023   Procedure: LAPAROSCOPIC CHOLECYSTECTOMY WITH ICG DYE;  Surgeon: Vanderbilt Ned, MD;  Location: [facility] OR;  Service: General;  Laterality: N/A;   EYE SURGERY Right 1980's   H/O right orbital blowout fracture   FOOT SURGERY Left    [place] Neuroma   GASTROSTOMY TUBE PLACEMENT     IR GASTROSTOMY TUBE REMOVAL     IR GENERIC HISTORICAL  10/12/2016   IR REMOVAL TUN ACCESS W/ PORT W/O FL MOD SED 10/12/2016 Ami Bellman, DO [facility]-INTERV RAD   IR  IMAGING GUIDED PORT INSERTION  07/19/2024   KNEE SURGERY Right    Arthroscopic   PORTA CATH INSERTION     RIB RESECTION Left 05/31/2023   Procedure: LEFT FIRST RIB EXCISION;  Surgeon: Magda Ned SAILOR, MD;  Location: [facility] OR;  Service: Vascular;  Laterality: Left;   VENOGRAM N/A 05/31/2023   Procedure: LEFT UPPER EXTREMITY AND CENTRAL VENOGRAM;  Surgeon: Magda Ned SAILOR, MD;  Location: [facility] OR;  Service: Vascular;  Laterality: N/A;   Patient Active Problem List   Diagnosis Date Noted   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   Vitamin B12 deficiency 07/12/2024   Acute cholecystitis 06/20/2023   Acute deep vein thrombosis (DVT) of left upper extremity (HCC) 06/20/2023   Hypothyroidism 06/20/2023   Thoracic outlet syndrome 05/31/2023   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    PERTINENT HISTORY:  SCC to his right tonsil, ([id number]). Previously diagnosed with stage IVA LEFT tonsil SCC in 2017. He was treated with radiation and immunotherapy at that time to his left tonsil and bilateral neck.  He presented to Dr. Izell to discuss the role of radiation therapy in management of his right tonsillar SCC. 06/07/24 He presented to Dr. Carlie with a 3 month history of a sore throat which he characterized as feeling raw on the right side of his throat as well as ear pressure. Oral  exam noted a exudative mass of the right tonsillar fossa. Biopsy obtained at that time revealed invasive SCC, p 16 -. 06/19/24 CT neck demonstrated the right palatine tonsillar mass extending onto the soft palate and into the right glossotonsillar sulcus, consistent with SCC, measuring at least 26 mm. No abnormal lymph nodes were demonstrated in the neck. A chest CT was also performed that same day which showed several indeterminate punctate pulmonary nodules, but no definite evidence of pulmonary metastatic disease. 06/28/24 He saw Dr. Lauralee to discuss surgical options. Although surgery would likely provide him with the best outcome, Dr. 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 In this setting, Dr. Lauralee would like him to be considered for re-irradiation which we will discuss in detail today. He has also been referred back to medical oncology and will meet with Dr. Autumn on 07/12/24 to discuss systemic therapy options. 07/04/24 PET demonstrated the hypermetabolic lesion in the vicinity of the right palatine tonsil consistent with malignancy, and with potential extension to the uvula, and a hypermetabolic nodule in the upper right parotid gland with potential differential considerations including primary parotid neoplasm vs a metastatic lymph node noted. Imaging otherwise showed no evidence of metastatic disease to the chest, abdomen, or pelvis. Other findings of potential clinical significance included a bladder calculus and prostatomegaly with a prominent median lobe indenting the bladder base. He will receive 33 fractions of radiation to his right tonsil with weekly chemotherapy. He'll start on 08/02/24 and will complete 09/15/24. 07/19/24 PAC. PEG will be scheduled in the 5th/6th week of treatment.  History of left tonsillar cancer 2017.  Recent imaging revealed a small spot in the parotid gland. Biopsy completed in IR on  07/24/24.  PATIENT GOALS:   to be educated about the signs and symptoms of lymphedema and learn post op HEP.   PAIN:  Are you having pain? No  PRECAUTIONS: Active CA  RED FLAGS: None   WEIGHT BEARING RESTRICTIONS: No  FALLS:  Has patient fallen in last 6 months? No Does the patient have a fear of falling that limits activity? No Is the patient reluctant to leave the house due to a fear of falling?No  LIVING ENVIRONMENT: Patient lives with: wife, Landry Lives in: House/apartment Has following equipment at home: None  OCCUPATION: works on [employer] farm, Occupational hygienist, takes care of pond, Physicist, medical, building, very active  LEISURE: does not exercise but is very active on the farm  PRIOR LEVEL OF FUNCTION: Independent   OBJECTIVE: Note: Objective measures were completed at Evaluation unless otherwise noted.  COGNITION: Overall cognitive status: Within functional limits for tasks assessed                  POSTURE:  Forward head and rounded shoulders  posture  30 SEC SIT TO STAND: 23 reps in 30 sec without use of UEs which is  Excellent for patient's age  SHOULDER AROM:   WFL   CERVICAL AROM:   Percent limited  Flexion WFL  Extension 75% limited  Right lateral flexion 75% limited  Left lateral flexion 50% limited  Right rotation 25% limited  Left rotation 25% limited    (Blank rows=not tested)   PATIENT EDUCATION:  Education details: Neck ROM, importance of posture when sitting, standing and lying down, deep breathing, walking program and importance of staying active throughout treatment, CURE article on staying active, Why exercise? flyer, lymphedema and PT info Person educated: Patient Education method: Explanation, Demonstration, Handout Education comprehension: Patient verbalized understanding and returned demonstration  HOME EXERCISE PROGRAM: Patient was instructed today in a home exercise program today for head and neck range of motion exercises. These included  active cervical flexion, active cervical extension, active cervical rotation to each direction, upper trap stretch, and shoulder retraction. Patient was encouraged to do these 2-3 times a day, holding for 5 sec each and completing for 5 reps. Pt was educated that once this becomes easier then hold the stretches for 30-60 seconds.    ASSESSMENT:  CLINICAL IMPRESSION: Pt arrives to PT with recently diagnosed R tonsillar cancer. He will receive 33 fractions of radiation to his right tonsil with weekly chemotherapy. He'll start on 08/02/24 and will complete 09/15/24. Pt's cervical ROM was limited in all directions except for flexion due to previous hx of radiation to neck. There is fibrosis palpable throughout neck with woody tissue texture noted. Educated pt about signs and symptoms of lymphedema as well as anatomy and physiology of lymphatic system. Educated pt in importance of staying as active as possible throughout treatment to decrease fatigue as well as head and neck ROM exercises to decrease loss of ROM. Will see pt after completion of radiation to reassess ROM and assess for lymphedema and to determine therapy needs at that time.  Pt will benefit from skilled therapeutic intervention to improve on the following deficits: Decreased knowledge of precautions and postural dysfunction. Other deficits: decreased ROM and increased fascial restrictions  PT treatment/interventions: ADL/self-care home management, pt/family education, therapeutic exercise. Other interventions [id number]- PT Re-evaluation, 97110-Therapeutic exercises, 97530- Therapeutic activity, [id number]- Neuromuscular re-education, 97535- Self Care, [id number]- Manual therapy, 97760- Orthotic Initial, [phone]- Orthotic/Prosthetic subsequent, and Patient/Family education  REHAB POTENTIAL: Good  CLINICAL DECISION MAKING: Evolving/moderate complexity  EVALUATION COMPLEXITY: Moderate   GOALS: Goals reviewed with patient? YES  LONG TERM GOALS:  (STG=LTG)   Name Target Date  Goal status  1 Patient will be able to verbalize understanding of a home exercise program for cervical range of motion, posture, and walking.   Baseline:  No knowledge 07/27/2024 Achieved at eval  2 Patient will be able to verbalize understanding of proper sitting and standing posture. Baseline:  No knowledge 07/27/2024 Achieved at eval  3 Patient will be able to verbalize understanding of lymphedema risk and availability of treatment for this condition Baseline:  No knowledge 07/27/2024 Achieved at eval  4 Pt will demonstrate a return to full cervical ROM and function post operatively compared to baselines and not demonstrate any signs or symptoms of lymphedema.  Baseline: See objective measurements taken today. 10/19/24 New    PLAN:  PT FREQUENCY/DURATION: EVAL and 1 follow up appointment.   PLAN FOR NEXT SESSION: will reassess 2 weeks after completion of radiation to determine needs.  Patient will follow up at  outpatient cancer rehab 2 weeks after completion of radiation.  If the patient requires physical therapy at that time, a specific plan will be dictated and sent to the referring physician for approval. The patient was educated today on appropriate basic range of motion exercises to begin now and continue throughout radiation and educated on the signs and symptoms of lymphedema. Patient verbalized good understanding.     Physical Therapy Information for During and After Head/Neck Cancer Treatment: Lymphedema is a swelling condition that you may be at risk for in your neck and/or face if you have radiation treatment to the area and/or if you have surgery that includes removing lymph nodes.  There is treatment available for this condition and it is not life-threatening.  Contact your physician or physical therapist with concerns. An excellent resource for those seeking information on lymphedema is the National Lymphedema Network's website.  It can be  accessed at www.lymphnet.org If you notice swelling in your neck or face at any time following surgery (even if it is many years from now), please contact your doctor or physical therapist to discuss this.  Lymphedema can be treated at any time but it is easier for you if it is treated early on. If you have had surgery to your neck, please check with your surgeon about how soon to start doing neck range of motion exercises.  If you are not having surgery, I encourage you to start doing neck range of motion exercises today and continue these while undergoing treatment, UNLESS you have irritation of your skin or soft tissue that is aggravated by doing them.  These exercises are intended to help you prevent loss of range of motion and/or to gain range of motion in your neck (which can be limited by tightening effects of radiation), and NOT to aggravate these tissues if they develop sensitivities from treatment. Neck range of motion exercises should be done to the point of feeling a GENTLE, TOLERABLE stretch only.  You are encouraged to start a walking or other exercise program tomorrow and continue this as much as you are able through and after treatment.  Please feel free to call me with any questions. Florina Lanis Carbon, PT, CLT Physical Therapist and Certified Lymphedema Therapist [facility] [address], [address], [place], [place] [address] [phone] [email or web address]  WALKING  Walking is a great form of exercise to increase your strength, endurance and overall fitness.  A walking program can help you start slowly and gradually build endurance as you go.  Everyone's ability is different, so each person's starting point will be different.  You do not have to follow them exactly.  The are just samples. You should simply find out what's right for you and stick to that program.   In the beginning, you'll start off walking 2-3 times a day for short distances.   As you get stronger, you'll be walking further at just 1-2 times per day.  A. You Can Walk For A Certain Length Of Time Each Day    Walk 5 minutes 3 times per day.  Increase 2 minutes every 2 days (3 times per day).  Work up to 25-30 minutes (1-2 times per day).   Example:   Day 1-2 5 minutes 3 times per day   Day 7-8 12 minutes 2-3 times per day   Day 13-14 25 minutes 1-2 times per day  B. You Can Walk For a Certain Distance Each Day     Distance can  be substituted for time.    Example:   3 trips to mailbox (at road)   3 trips to corner of block   3 trips around the block  C. Go to local high school and use the track.    Walk for distance ____ around track  Or time ____ minutes  D. Walk ____ Jog ____ Run ___   Why exercise?  So many benefits! Here are SOME of them: Heart health, including raising your good cholesterol level and reducing heart rate and blood pressure Lung health, including improved lung capacity It burns fats, and most of us  can stand to be leaner, whether or not we are overweight. It increases the body's natural painkillers and mood elevators, so makes you feel better. Not only makes you feel better, but look better too Improves sleep Takes a bite out of stress May decrease your risk of many types of cancer If you are currently undergoing cancer treatment, exercise may improve your ability to tolerate treatments including chemotherapy. For everybody, it can improve your energy level. Those with cancer-related fatigue report a 40-50% reduction in this symptom when exercising regularly. If you are a survivor of breast, colon, or prostate cancer, it may decrease your risk of a recurrence. (This may hold for other cancers too, but so far we have data just for these three types.)  How to exercise: Get your doctor's okay. Pick something you enjoy doing, like walking, Zumba, biking, swimming, or whatever. Start at low intensity and time, then gradually  increase.  (See walking program handout.) Set a goal to achieve over time.  The American Cancer Society, American Heart Association, and U.S. Dept. of Health and Human Services recommend 150 minutes of moderate exercise, 75 minutes of vigorous exercise, or a combination of both per week. This should be done in episodes at least 10 minutes long, spread throughout the week.  Need help being motivated? Pick something you enjoy doing, because you'll be more inclined to stick with that activity than something that feels like a chore. Do it with a friend so that you are accountable to each other. Schedule it into your day. Place it on your calendar and keep that appointment just like you do any appointment that you make. Join an exercise group that meets at a specific time.  That way, you have to show up on time, and that makes it harder to procrastinate about doing your workout.  It also keeps you accountable--people begin to expect you to be there. Join a gym where you feel comfortable and not intimidated, at the right cost. Sign up for something that you'll need to be in shape for on a specific date, like a 1K or a 5K to walk or run, a 20 or 30 mile bike ride, a mud run or something like that. If the date is looming, you know you'll need to train to be ready for it.  An added benefit is that many of these are fundraisers for good causes. If you've already paid for a gym membership, group exercise class or event, you might as well work out, so you haven't wasted your money!    [facility], PT 07/27/2024, 10:19 AM

## 2024-07-27 ENCOUNTER — Ambulatory Visit: Attending: Radiation Oncology

## 2024-07-27 ENCOUNTER — Other Ambulatory Visit: Payer: Self-pay

## 2024-07-27 ENCOUNTER — Encounter: Payer: Self-pay | Admitting: Physical Therapy

## 2024-07-27 ENCOUNTER — Ambulatory Visit: Attending: Radiation Oncology | Admitting: Physical Therapy

## 2024-07-27 DIAGNOSIS — C090 Malignant neoplasm of tonsillar fossa: Secondary | ICD-10-CM | POA: Diagnosis present

## 2024-07-27 DIAGNOSIS — R293 Abnormal posture: Secondary | ICD-10-CM | POA: Diagnosis present

## 2024-07-27 DIAGNOSIS — R1312 Dysphagia, oropharyngeal phase: Secondary | ICD-10-CM | POA: Insufficient documentation

## 2024-07-27 NOTE — Patient Instructions (Signed)
 SWALLOWING EXERCISES Do these 5-6 days/week until 6 months after your last day of radiation, then 2 days per week afterwards You can use 1-2 drops of liquid to help you swallow, if your mouth gets dry  Effortful Swallows - Press your tongue against the roof of your mouth for 3 seconds, then swallow as hard as you can - Do at least 20 reps/day, in sets of 5-10  Masako Swallow - swallow with your tongue sticking out - Stick tongue out past your lips and gently bite tongue with your teeth - Swallow, while holding your tongue with your teeth - Do at least 20 reps/day, in sets of 5-10   Shaker Exercise - head lift - Lie flat on your back in your bed, the floor, or a couch  - Raise your head and look at your feet - KEEP YOUR SHOULDERS DOWN - HOLD FOR 45-60 SECONDS, then lower your head back down - Repeat 3 times, 2-3 times a day  [employer] - "squeeze swallow" exercise - Swallow, and squeeze tight to keep your Adam's Apple up - Hold the squeeze for 5-7 seconds - then relax - Do at least 20 reps/day, in sets of 5-10  5.   Chin tuck - Place a rolled up towel (4 inches wide) under your chin near your neck - Tuck your chin and push hard on the towel for 5 seconds - Do at least 20 reps/day, in sets of 5-10

## 2024-07-27 NOTE — Progress Notes (Signed)
 Oncology Nurse Navigator Documentation   I met with Mr. Ybarra and his wife before and after head and neck MDC today. They are ready to begin treatment next week. I answered their questions and they know to call me if they have any further questions or concerns as he begins treatment next week.   Delon Jefferson RN, BSN, OCN Head & Neck Oncology Nurse Navigator [facility] at [facility] Phone # [phone]  Fax # [phone]

## 2024-07-28 ENCOUNTER — Other Ambulatory Visit: Payer: Self-pay

## 2024-07-28 NOTE — Progress Notes (Signed)
 Pharmacist Chemotherapy Monitoring - Initial Assessment    Anticipated start date: 08/04/24   The following has been reviewed per standard work regarding the patient's treatment regimen: The patient's diagnosis, treatment plan and drug doses, and organ/hematologic function Lab orders and baseline tests specific to treatment regimen  The treatment plan start date, drug sequencing, and pre-medications Prior authorization status  Patient's documented medication list, including drug-drug interaction screen and prescriptions for anti-emetics and supportive care specific to the treatment regimen The drug concentrations, fluid compatibility, administration routes, and timing of the medications to be used The patient's access for treatment and lifetime cumulative dose history, if applicable  The patient's medication allergies and previous infusion related reactions, if applicable   Changes made to treatment plan:  N/A  Follow up needed:  N/A   Gregory Jenkins, RPH, 07/28/2024  11:27 AM

## 2024-07-31 ENCOUNTER — Ambulatory Visit
Admission: RE | Admit: 2024-07-31 | Discharge: 2024-07-31 | Disposition: A | Discharge status: Home / Self Care | Source: Ambulatory Visit | Attending: Radiation Oncology | Admitting: Radiation Oncology

## 2024-07-31 DIAGNOSIS — Z510 Encounter for antineoplastic radiation therapy: Secondary | ICD-10-CM | POA: Insufficient documentation

## 2024-07-31 DIAGNOSIS — Z87891 Personal history of nicotine dependence: Secondary | ICD-10-CM | POA: Insufficient documentation

## 2024-07-31 DIAGNOSIS — Z5111 Encounter for antineoplastic chemotherapy: Secondary | ICD-10-CM | POA: Diagnosis present

## 2024-07-31 DIAGNOSIS — I959 Hypotension, unspecified: Secondary | ICD-10-CM | POA: Diagnosis not present

## 2024-07-31 DIAGNOSIS — E538 Deficiency of other specified B group vitamins: Secondary | ICD-10-CM | POA: Diagnosis not present

## 2024-07-31 DIAGNOSIS — C099 Malignant neoplasm of tonsil, unspecified: Secondary | ICD-10-CM | POA: Diagnosis present

## 2024-07-31 DIAGNOSIS — Z79899 Other long term (current) drug therapy: Secondary | ICD-10-CM | POA: Diagnosis not present

## 2024-07-31 DIAGNOSIS — C770 Secondary and unspecified malignant neoplasm of lymph nodes of head, face and neck: Secondary | ICD-10-CM | POA: Insufficient documentation

## 2024-08-02 ENCOUNTER — Encounter: Payer: Self-pay | Admitting: Oncology

## 2024-08-02 ENCOUNTER — Inpatient Hospital Stay: Attending: Oncology | Admitting: Oncology

## 2024-08-02 ENCOUNTER — Inpatient Hospital Stay

## 2024-08-02 ENCOUNTER — Other Ambulatory Visit: Payer: Self-pay

## 2024-08-02 VITALS — BP 124/74 | HR 61 | Temp 98.1°F | Resp 17 | Ht 71.0 in | Wt 166.0 lb

## 2024-08-02 DIAGNOSIS — Z510 Encounter for antineoplastic radiation therapy: Secondary | ICD-10-CM | POA: Diagnosis not present

## 2024-08-02 DIAGNOSIS — C099 Malignant neoplasm of tonsil, unspecified: Secondary | ICD-10-CM | POA: Diagnosis not present

## 2024-08-02 DIAGNOSIS — Z87891 Personal history of nicotine dependence: Secondary | ICD-10-CM | POA: Insufficient documentation

## 2024-08-02 DIAGNOSIS — H9193 Unspecified hearing loss, bilateral: Secondary | ICD-10-CM | POA: Diagnosis not present

## 2024-08-02 DIAGNOSIS — E039 Hypothyroidism, unspecified: Secondary | ICD-10-CM | POA: Diagnosis not present

## 2024-08-02 DIAGNOSIS — Z79899 Other long term (current) drug therapy: Secondary | ICD-10-CM | POA: Insufficient documentation

## 2024-08-02 DIAGNOSIS — H9190 Unspecified hearing loss, unspecified ear: Secondary | ICD-10-CM | POA: Insufficient documentation

## 2024-08-02 DIAGNOSIS — Z5111 Encounter for antineoplastic chemotherapy: Secondary | ICD-10-CM | POA: Insufficient documentation

## 2024-08-02 DIAGNOSIS — C770 Secondary and unspecified malignant neoplasm of lymph nodes of head, face and neck: Secondary | ICD-10-CM | POA: Insufficient documentation

## 2024-08-02 DIAGNOSIS — I959 Hypotension, unspecified: Secondary | ICD-10-CM | POA: Insufficient documentation

## 2024-08-02 DIAGNOSIS — E538 Deficiency of other specified B group vitamins: Secondary | ICD-10-CM | POA: Insufficient documentation

## 2024-08-02 LAB — CBC WITH DIFFERENTIAL (CANCER CENTER ONLY)
Abs Immature Granulocytes: 0.01 K/uL (ref 0.00–0.07)
Basophils Absolute: 0 K/uL (ref 0.0–0.1)
Basophils Relative: 0 %
Eosinophils Absolute: 0.1 K/uL (ref 0.0–0.5)
Eosinophils Relative: 1 %
HCT: 36.7 % — ABNORMAL LOW (ref 39.0–52.0)
Hemoglobin: 12.4 g/dL — ABNORMAL LOW (ref 13.0–17.0)
Immature Granulocytes: 0 %
Lymphocytes Relative: 13 %
Lymphs Abs: 0.7 K/uL (ref 0.7–4.0)
MCH: 31.7 pg (ref 26.0–34.0)
MCHC: 33.8 g/dL (ref 30.0–36.0)
MCV: 93.9 fL (ref 80.0–100.0)
Monocytes Absolute: 0.6 K/uL (ref 0.1–1.0)
Monocytes Relative: 10 %
Neutro Abs: 4.2 K/uL (ref 1.7–7.7)
Neutrophils Relative %: 76 %
Platelet Count: 175 K/uL (ref 150–400)
RBC: 3.91 MIL/uL — ABNORMAL LOW (ref 4.22–5.81)
RDW: 12.8 % (ref 11.5–15.5)
WBC Count: 5.5 K/uL (ref 4.0–10.5)
nRBC: 0 % (ref 0.0–0.2)

## 2024-08-02 LAB — RAD ONC ARIA SESSION SUMMARY
Course Elapsed Days: 0
Plan Fractions Treated to Date: 1
Plan Prescribed Dose Per Fraction: 2 Gy
Plan Total Fractions Prescribed: 33
Plan Total Prescribed Dose: 66 Gy
Reference Point Dosage Given to Date: 2 Gy
Reference Point Session Dosage Given: 2 Gy
Session Number: 1

## 2024-08-02 LAB — BASIC METABOLIC PANEL - CANCER CENTER ONLY
Anion gap: 3 — ABNORMAL LOW (ref 5–15)
BUN: 19 mg/dL (ref 8–23)
CO2: 29 mmol/L (ref 22–32)
Calcium: 9.3 mg/dL (ref 8.9–10.3)
Chloride: 106 mmol/L (ref 98–111)
Creatinine: 0.91 mg/dL (ref 0.61–1.24)
GFR, Estimated: >60 mL/min (ref >60)
Glucose, Bld: 131 mg/dL — ABNORMAL HIGH (ref 70–99)
Potassium: 3.8 mmol/L (ref 3.5–5.1)
Sodium: 138 mmol/L (ref 135–145)

## 2024-08-02 LAB — MAGNESIUM: Magnesium: 2.2 mg/dL (ref 1.7–2.4)

## 2024-08-02 NOTE — Assessment & Plan Note (Addendum)
 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  He continued to have follow-ups up until 2022 with no evidence of disease clinically and on imaging.  Previously treated by Dr. Lonn in our clinic.  Now with a recurrence on the right side, p16 negative.  No evidence of metastatic disease on PET scan.  He was seen in consultation by Dr. Lauralee (surgical ENT) on 06/28/24 to discuss surgical treatment options. Although surgery would likely provide him with the best outcome, Dr. 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 In this setting, Dr. Lauralee would like him to be considered for re-irradiation.   Patient did receive radiation to the bilateral cervical lymph nodes in the past.  Dr. Izell evaluated the patient and plan is for reirradiation for current recurrence.  His case was discussed in our ENT tumor conference on 07/12/2024.  Consensus opinion is to proceed with concurrent chemoradiation with cisplatin.  We have discussed about role of cisplatin being a radiosensitizer in the treatment of head and neck cancer.  We have discussed about the curative intent of chemoradiation for this patient.     We have discussed about mechanism of action of cisplatin, adverse effects of cisplatin including but not limited to fatigue, nausea, vomiting, increased risk of infections, mucositis, ototoxicity, nephrotoxicity, peripheral neuropathy.  Patient understands that some of the side effects can be permanent and even potentially fatal.  We have discussed about role of Mediport and G-tube for chemotherapy administration and nutrition respectively since most of these patients have severe mucositis during the treatment.  At this time we do not know if weekly cisplatin is inferior to every 21 days cisplatin since there is no head-to-head comparison trial. I did  mention to the patient however weekly cisplatin is well-tolerated with less adverse effects and most of the patients tend to complete treatment as planned.  Patient is willing to proceed with weekly cisplatin.  Goal is to avoid surgery and achieve remission. Weekly administration allows dose adjustments based on tolerance and side effects, aiming for a less severe experience compared to high-dose regimens.  His case was discussed in tumor conference on 07/12/2024.  Plan made to proceed with MRI of the neck for further evaluation and delineation of the tonsil mass.  Also plan for biopsy of the parotid lesion.  On 07/17/2024, MRI of the neck showed nodular thickening and enhancement of the right palatine tonsil extending to the soft palate, base of the uvula, and lateral/posterolateral oropharyngeal mucosa, compatible with oropharyngeal malignancy. 7 mm nodule in the superficial lobe of the right parotid corresponding to prior PET uptake, suspicious for primary parotid neoplasm or metastatic lymph node.  On 07/24/2024, core biopsy of the right parotid lesion showed bland oncocytic lesion, benign finding.  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   Plan made to proceed with concurrent chemoradiation using weekly cisplatin.    - Monitor kidney function and blood counts weekly - Ensure hydration with 70-80 ounces of fluid daily - Prescribed antiemetics for nausea management  - Pre-schedule feeding tube placement for 5th or 6th week of treatment if needed  He began radiation treatments today (08/02/2024).  He is scheduled to receive cycle 1 of cisplatin on 08/04/2024.  Labs reveal no dose-limiting toxicities.  Will proceed with cisplatin and continue this weekly.  I will see him in 1 week for follow-up with repeat labs and  continuation of chemotherapy.

## 2024-08-02 NOTE — Assessment & Plan Note (Addendum)
 Long-standing hypothyroidism managed with thyroid  medication. Family history of thyroid  disorders.  - Recent TSH was normal on 07/12/2024 - Continue current thyroid  medication

## 2024-08-02 NOTE — Progress Notes (Signed)
 Oncology Nurse Navigator Documentation   To provide support, encouragement and care continuity, met with Mr. Kakar for his initial RT.  He was accompanied by his wife. I reviewed the 2-step treatment process, answered questions.  Mr. Pinzon completed treatment without difficulty, denied questions/concerns. I reviewed the registration/arrival procedure for subsequent treatments. I encouraged them to call me with questions/concerns as treatments proceed.   Delon Jefferson RN, BSN, OCN Head & Neck Oncology Nurse Navigator [facility] at [facility] Phone # [phone]  Fax # [phone]

## 2024-08-02 NOTE — Progress Notes (Signed)
 [facility]  ONCOLOGY CLINIC PROGRESS NOTE   Patient Care Team: Gregory Prentice SAUNDERS, FNP as PCP - General (Family Medicine) Gregory Clark, MD as Consulting Physician (Otolaryngology) Gregory Domino, MD as Attending Physician (Radiation Oncology) Gregory Jenkins, RD as Dietitian (Nutrition) Malmfelt, Delon CROME, RN as Oncology Nurse Navigator Gregory Millman, MD as Consulting Physician (Oncology) Gregory Chew, MD as Referring Physician (Otolaryngology)  PATIENT NAME: Gregory Jenkins   MR#: [id number] DOB: 09-07-1951  Date of visit: 08/02/2024   ASSESSMENT & PLAN:   Gregory Jenkins is a 73 y.o. gentleman with a past medical history of left tonsillar squamous cell carcinoma, managed with concurrent chemoradiation using cetuximab  in 2017, went into remission, was referred to our clinic for recently diagnosed right tonsillar cancer. cT2,cN0,cM0,p16- , Stage II.   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  He continued to have follow-ups up until 2022 with no evidence of disease clinically and on imaging.  Previously treated by Gregory Jenkins in our clinic.  Now with a recurrence on the right side, p16 negative.  No evidence of metastatic disease on PET scan.  He was seen in consultation by Dr. Lauralee (surgical ENT) on 06/28/24 to discuss surgical treatment options. Although surgery would likely provide him with the best outcome, Dr. 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 In this setting, Dr. Lauralee would like him to be considered for re-irradiation.   Patient did receive radiation to the bilateral cervical lymph nodes in the past.  Dr. Izell evaluated the patient and plan is for reirradiation for current recurrence.  His case was discussed in our ENT tumor conference on  07/12/2024.  Consensus opinion is to proceed with concurrent chemoradiation with cisplatin.  We have discussed about role of cisplatin being a radiosensitizer in the treatment of head and neck cancer.  We have discussed about the curative intent of chemoradiation for this patient.     We have discussed about mechanism of action of cisplatin, adverse effects of cisplatin including but not limited to fatigue, nausea, vomiting, increased risk of infections, mucositis, ototoxicity, nephrotoxicity, peripheral neuropathy.  Patient understands that some of the side effects can be permanent and even potentially fatal.  We have discussed about role of Mediport and G-tube for chemotherapy administration and nutrition respectively since most of these patients have severe mucositis during the treatment.  At this time we do not know if weekly cisplatin is inferior to every 21 days cisplatin since there is no head-to-head comparison trial. I did mention to the patient however weekly cisplatin is well-tolerated with less adverse effects and most of the patients tend to complete treatment as planned.  Patient is willing to proceed with weekly cisplatin.  Goal is to avoid surgery and achieve remission. Weekly administration allows dose adjustments based on tolerance and side effects, aiming for a less severe experience compared to high-dose regimens.  His case was discussed in tumor conference on 07/12/2024.  Plan made to proceed with MRI of the neck for further evaluation and delineation of the tonsil mass.  Also plan for biopsy of the parotid lesion.  On 07/17/2024, MRI of the neck showed nodular thickening and enhancement of the right palatine tonsil extending to the soft palate, base of the uvula, and lateral/posterolateral oropharyngeal mucosa, compatible with oropharyngeal malignancy. 7 mm nodule in the  superficial lobe of the right parotid corresponding to prior PET uptake, suspicious for primary parotid neoplasm or  metastatic lymph node.  On 07/24/2024, core biopsy of the right parotid lesion showed bland oncocytic lesion, benign finding.  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   Plan made to proceed with concurrent chemoradiation using weekly cisplatin.    - Monitor kidney function and blood counts weekly - Ensure hydration with 70-80 ounces of fluid daily - Prescribed antiemetics for nausea management  - Pre-schedule feeding tube placement for 5th or 6th week of treatment if needed  He began radiation treatments today (08/02/2024).  He is scheduled to receive cycle 1 of cisplatin on 08/04/2024.  Labs reveal no dose-limiting toxicities.  Will proceed with cisplatin and continue this weekly.  I will see him in 1 week for follow-up with repeat labs and continuation of chemotherapy.  Hypothyroidism Long-standing hypothyroidism managed with thyroid  medication. Family history of thyroid  disorders.  - Recent TSH was normal on 07/12/2024 - Continue current thyroid  medication   Hearing loss Pre-existing hearing loss with use of hearing aids. Cisplatin may exacerbate hearing loss, potentially leading to permanent changes. He is aware of the risk and plans to upgrade hearing aids post-treatment if necessary. - Monitor auditory function during treatment. - Consider hearing aid adjustment post-treatment if hearing loss worsens.    Vitamin B12 deficiency Vitamin B12 deficiency managed with monthly B12 injections. Coordination with oncology clinic to administer B12 injections during treatment visits to reduce additional appointments. - Administer B12 injection at oncology clinic starting in October - Coordinate with family doctor to transition B12 administration to oncology clinic   Mucous membrane changes of mouth Experiencing thick phlegm, possibly related to tumor or treatment. - Advise mouth rinses with baking soda and salt solution several times a day.  Anemia Mild  anemia with hemoglobin at 12.4, likely related to ongoing chemotherapy. - Monitor hemoglobin levels regularly.  I reviewed lab results and outside records for this visit and discussed relevant results with the patient. Diagnosis, plan of care and treatment options were also discussed in detail with the patient. Opportunity provided to ask questions and answers provided to his apparent satisfaction. Provided instructions to call our clinic with any problems, questions or concerns prior to return visit. I recommended to continue follow-up with PCP and sub-specialists. He verbalized understanding and agreed with the plan.   NCCN guidelines have been consulted in the planning of this patient's care.  I spent a total of 40 minutes during this encounter with the patient including review of chart and various tests results, discussions about plan of care and coordination of care plan.   [name], MD  08/02/2024 5:33 PM  [facility] - A DEPT OF [facility] [address] [place] [address] Dept: [phone] Dept Fax: [phone]    CHIEF COMPLAINT/ REASON FOR VISIT:   Recently diagnosed right tonsillar cancer. cT2,cN0,cM0,p16- , Stage II. Has history of left-sided tonsillar cancer treated in 2017 with chemoradiation using cetuximab .   Current Treatment: Started concurrent chemoradiation with weekly cisplatin from 08/02/2024.  INTERVAL HISTORY:    Discussed the use of AI scribe software for clinical note transcription with the patient, who gave verbal consent to proceed.  History of Present Illness Lynn Sissel is a 73 year old male with a benign peritoneal tumor who presents for chemotherapy and radiation treatment.  He began radiation therapy today, approximately an  hour before the visit. He underwent several diagnostic procedures, including an MRI and biopsies, which showed a tumor in the peritoneum that was  described to him as benign. A recent dental procedure involved tooth extraction, which led to a scan showing gauze mistaken for a mass.  His kidney function is stable with a creatinine level of 0.91, and his chemistries are within normal limits. His hemoglobin is slightly low at 12.4, consistent with previous levels. He is on a chemotherapy regimen that includes cisplatin. He is taking Decadron , Zofran , and Compazine  to manage nausea associated with chemotherapy. The Decadron  is scheduled for three days following chemotherapy, with Zofran  and Compazine  available as needed.  He experiences thick phlegm, which he associates with the tumor. He plans to use a baking soda and salt mouth rinse to alleviate this symptom.  His chemotherapy sessions are scheduled for Fridays and are expected to last approximately six hours. He is allowed to have a light breakfast on treatment days.    I have reviewed the past medical history, past surgical history, social history and family history with the patient and they are unchanged from previous note.  HISTORY OF PRESENT ILLNESS:   ONCOLOGY HISTORY:    Initial diagnosis of left tonsillar cancer in 2017 for which he received radiation therapy to the left tonsil and involved bilateral cervical lymph nodes. This was concurrent with systemic therapy consisting of cetuximab . He was last seen by Rad Onc for a follow-up visit on 10/02/2016 and was NED on examination and imaging at that time.    He continued to follow with Dr. Carlie (ENT) and with Gregory [facility] under surveillance until 2022. He did have follow-up imaging performed during that interval of time which consisted of a follow-up neck CT on 02/13/2019 that showed NED and stable post treatment changes in the neck.    He did however return to Dr. Ilona office on 02/24/22 for evaluation of a spot on the back of his tongue. However, oral exam performed at that time did not show any abnormal findings to correlate with  the reported concern.    He presented to Dr. Ilona office on 06/07/24 with a 3 month history of a sore throat which he characterized as feeling raw on the right side of his throat (with occasional extension to the roof of his mouth), as well as ear pressure. Oral exam performed at that time noted an exudative mass of the right tonsillar fossa. A biopsy of the right tonsillar mass was accordingly obtained at that time that showed findings consistent with invasive squamous cell carcinoma (moderately differentiated); p16 negative.    Soft tissue neck CT with contrast on 06/19/24 demonstrated: the right palatine tonsillar mass extending onto the soft palate and into the right glossotonsillar sulcus, consistent with SCC, measuring at least 26 mm. No abnormal lymph nodes were demonstrated in the neck. A chest CT was also performed that same day which showed several indeterminate punctate pulmonary nodules, but no definite evidence of pulmonary metastatic disease.    He was seen in consultation by Dr. Lauralee (surgical ENT) on 06/28/24 to discuss surgical treatment options. Although surgery would likely provide him with the best outcome, Dr. 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 In this setting, Dr. Lauralee would like him to be considered for re-irradiation. He has also been referred back to medical oncology.   PET scan performed on 07/04/24 demonstrated: the hypermetabolic lesion in the  vicinity of the right palatine tonsil consistent with malignancy, and with potential extension to the uvula, and a hypermetabolic nodule in the upper right parotid gland with potential differential considerations including primary parotid neoplasm vs a metastatic lymph node noted. Imaging otherwise showed no evidence of metastatic disease to the chest, abdomen, or pelvis. Other findings of potential clinical significance included a bladder  calculus and prostatomegaly with a prominent median lobe indenting the bladder base.    His case was discussed in tumor conference on 07/12/2024.  Plan made to proceed with MRI of the neck for further evaluation and delineation of the tonsil mass.  Also plan for biopsy of the parotid lesion.  On 07/17/2024, MRI of the neck showed nodular thickening and enhancement of the right palatine tonsil extending to the soft palate, base of the uvula, and lateral/posterolateral oropharyngeal mucosa, compatible with oropharyngeal malignancy. 7 mm nodule in the superficial lobe of the right parotid corresponding to prior PET uptake, suspicious for primary parotid neoplasm or metastatic lymph node.  On 07/24/2024, core biopsy of the right parotid lesion showed bland oncocytic lesion, benign finding.  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   Plan made to proceed with concurrent chemoradiation using weekly cisplatin.  Started this from 08/02/2024.   Oncology History  Malignant neoplasm of tonsillar fossa (HCC)  11/12/2015 Procedure   Accession: [id number] FNA of left neck LN positive for squamous cell cancer   11/18/2015 Imaging   Left greater than right cervical lymphadenopathy suspicious for nodal metastatic disease.2. Mild asymmetry of the left tonsil -- correlate with direct visualization to assess for primary neoplasm.   11/20/2015 Procedure   He has left tonsil biopsy   11/20/2015 Pathology Results   Accession: [id number] Left tonsil biopsy showed squamous cell cancer, p16 positive.   11/28/2015 PET scan   1. Left palatine tonsil primary with left worse than right cervical nodal metastasis. 2. No extracervical hypermetabolic metastasis.    12/12/2015 Procedure   Peg and port-a-cath placement.   12/18/2015 - 01/29/2016 Chemotherapy   He received weekly cetuximab    12/18/2015 - 02/03/2016 Radiation Therapy   Received Helicle IMRT Tomotherapy:  Left tonsil and bilateral neck / 70  Gy in 35 fractions to gross disease, 63 Gy in 35 fractions to high risk nodal echelons, and 56 Gy in 35 fractions to intermediate risk nodal echelons.   12/31/2015 Adverse Reaction   Treatment #3 is placed on hold due to worsening mucositis   01/08/2016 Miscellaneous   Cetuximab  #3 resumed with 50% dose adjustment   01/14/2016 Adverse Reaction   Treatment #4 is placed on hold due to worsening mucositis and failure to thrive   03/20/2016 Imaging   CT abdomen showed mild gallbladder wall thickening with pericholecystic fluid and several gallstones. The gallbladder is nondistended. Recommend clinical correlation for acute or chronic cholecystitis.   04/15/2016 Procedure   PEG removed.   05/26/2016 Imaging   Restaging PET:  Marked interval improvement with resolution of left tonsillar hypermetabolism and right cervical lymph node hypermetabolism. Left cervical lymph nodes have decreased substantially in size and there is a subtle degree of FDG hypermetabolism still associated with these left-sided cervical nodes.   10/12/2016 Procedure   Port-a-cath removed.   02/14/2019 Imaging   CT neck 1. Satisfactory post treatment appearance of the neck. NI-Rads category 1. 2. No mass or abnormality to correspond to the marked area of concern at the level of the right submandibular gland.     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  ALLERGIES: He has no known allergies.  MEDICATIONS:   Current Outpatient Medications  Medication Sig Dispense Refill   acetaminophen  (TYLENOL ) 500 MG tablet Take 1,000 mg by mouth every 6 (six) hours as needed for moderate pain.     aspirin  EC 81 MG tablet Take 81 mg by mouth daily.     atorvastatin  (LIPITOR) 10 MG tablet Take 10 mg by mouth daily.     cyanocobalamin (VITAMIN B12) 1000 MCG/ML injection Inject 1,000 mcg into the muscle every 30 (thirty) days.     dexamethasone  (DECADRON ) 4 MG tablet Take 2 tablets (8 mg) by mouth daily x 3 days starting the day after cisplatin chemotherapy. Take with food. 30 tablet 1   ipratropium (ATROVENT) 0.06 % nasal spray Place 2 sprays into both nostrils daily as needed for rhinitis.     levothyroxine  (SYNTHROID ) 125 MCG tablet Take 125 mcg by mouth daily before breakfast.  3   lidocaine -prilocaine  (EMLA ) cream Apply to affected area once 30 g 3   ondansetron  (ZOFRAN ) 8 MG tablet Take 1 tablet (8 mg total) by mouth every 8 (eight) hours as needed for nausea or vomiting. Start on the third day after cisplatin. 30 tablet 1   prochlorperazine  (COMPAZINE ) 10 MG tablet Take 1 tablet (10 mg total) by mouth every 6 (six) hours as needed (Nausea or vomiting). 30 tablet 1   No current facility-administered medications for this visit.     VITALS:   Blood pressure 124/74, pulse 61, temperature 98.1 F (36.7 C), temperature source Temporal, resp. rate 17, height 5' 11 (1.803 m), weight 166 lb (75.3 kg), SpO2 99%.  Wt Readings from Last 3 Encounters:  08/02/24 166 lb (75.3 kg)  07/19/24 164 lb 10.9 oz (74.7 kg)  07/18/24 164 lb 9.6 oz (74.7 kg)    Body mass index is 23.15 kg/m.    Onc Performance Status - 08/02/24 1601       ECOG Perf Status   ECOG Perf Status Fully active, able to carry on all pre-disease performance without restriction      KPS SCALE   KPS % SCORE Normal, no compliants, no evidence of disease          PHYSICAL EXAM:   Physical Exam Constitutional:      General: He is not in  acute distress.    Appearance: Normal appearance.  HENT:     Head: Normocephalic and atraumatic.     Mouth/Throat:     Comments: Tumor arising from the majority of the right tonsil, with the edge starting to abut the uvula. Tongue midline. No trismus Eyes:     Conjunctiva/sclera: Conjunctivae normal.  Neck:     Comments: Post radiation therapy fibrosis noted in the neck area, well-healed.   No definite palpable cervical lymphadenopathy Cardiovascular:     Rate and Rhythm: Normal rate and regular rhythm.  Pulmonary:     Effort:  Pulmonary effort is normal. No respiratory distress.  Abdominal:     General: There is no distension.  Neurological:     General: No focal deficit present.     Mental Status: He is alert and oriented to person, place, and time.  Psychiatric:        Mood and Affect: Mood normal.        Behavior: Behavior normal.      LABORATORY DATA:   I have reviewed the data as listed.  Results for orders placed or performed in visit on 08/02/24  Rad Onc [facility]ria Session Summary  Result Value Ref Range   Course ID [id number]    Course Start Date 07/18/2024    Session Number 1    Course First Treatment Date 08/02/2024  3:17 PM    Course Last Treatment Date 08/02/2024  3:19 PM    Course Elapsed Days 0    Reference Point ID HN_R_Tonsil DP    Reference Point Dosage Given to Date 2 Gy   Reference Point Session Dosage Given 2 Gy   Plan ID HN_R_Tonsil    Plan Name HN_R_Tonsil    Plan Fractions Treated to Date 1    Plan Total Fractions Prescribed 33    Plan Prescribed Dose Per Fraction 2 Gy   Plan Total Prescribed Dose 66.000000 Gy   Plan Primary Reference Point HN_R_Tonsil DP   Results for orders placed or performed in visit on 08/02/24  CBC with Differential ([facility] Only)  Result Value Ref Range   WBC Count 5.5 4.0 - 10.5 K/uL   RBC 3.91 (L) 4.22 - 5.81 MIL/uL   Hemoglobin 12.4 (L) 13.0 - 17.0 g/dL   HCT 63.2 (L) 60.9 - 47.9 %   MCV 93.9 80.0 - 100.0 fL   MCH  31.7 26.0 - 34.0 pg   MCHC 33.8 30.0 - 36.0 g/dL   RDW 87.1 88.4 - 84.4 %   Platelet Count 175 150 - 400 K/uL   nRBC 0.0 0.0 - 0.2 %   Neutrophils Relative % 76 %   Neutro Abs 4.2 1.7 - 7.7 K/uL   Lymphocytes Relative 13 %   Lymphs Abs 0.7 0.7 - 4.0 K/uL   Monocytes Relative 10 %   Monocytes Absolute 0.6 0.1 - 1.0 K/uL   Eosinophils Relative 1 %   Eosinophils Absolute 0.1 0.0 - 0.5 K/uL   Basophils Relative 0 %   Basophils Absolute 0.0 0.0 - 0.1 K/uL   Immature Granulocytes 0 %   Abs Immature Granulocytes 0.01 0.00 - 0.07 K/uL  Basic Metabolic Panel - [facility] Only  Result Value Ref Range   Sodium 138 135 - 145 mmol/L   Potassium 3.8 3.5 - 5.1 mmol/L   Chloride 106 98 - 111 mmol/L   CO2 29 22 - 32 mmol/L   Glucose, Bld 131 (H) 70 - 99 mg/dL   BUN 19 8 - 23 mg/dL   Creatinine 9.08 9.38 - 1.24 mg/dL   Calcium  9.3 8.9 - 10.3 mg/dL   GFR, Estimated >39 >39 mL/min   Anion gap 3 (L) 5 - 15  Magnesium  Result Value Ref Range   Magnesium 2.2 1.7 - 2.4 mg/dL      RADIOGRAPHIC STUDIES:  I have personally reviewed the radiological images as listed and agree with the findings in the report.  US  CORE BIOPSY (SALIVARY GLAND/PAROTID GLAND) Result Date: 07/24/2024 INDICATION: History of tonsillar cancel with hypermetabolic lymph node in the right parotid gland. EXAM: Ultrasound-guided core  biopsy MEDICATIONS: None. ANESTHESIA/SEDATION: None FLUOROSCOPY TIME:  None COMPLICATIONS: None immediate. PROCEDURE: Informed written consent was obtained from the patient after a thorough discussion of the procedural risks, benefits and alternatives. All questions were addressed. Maximal Sterile Barrier Technique was utilized including caps, mask, sterile gowns, sterile gloves, sterile drape, hand hygiene and skin antiseptic. A timeout was performed prior to the initiation of the procedure. Ultrasound was used to interrogate the right parotid gland. A small hypoechoic nodule is visible immediately  anterior to the tragus of the ear. Skin was sterilely prepped draped in the standard fashion with chlorhexidine . Local anesthesia was attained by infiltration with 1% lidocaine . A small dermatotomy made. Under real-time ultrasound guidance, multiple 18 gauge core biopsies were obtained using the automated biopsy device. Biopsy specimens were placed on a Telfa pad and delivered to pathology for further analysis. Post biopsy ultrasound imaging demonstrates no evidence of complication. IMPRESSION: Successful ultrasound-guided core biopsy of right parotid nodule. Electronically Signed   By: Wilkie Lent M.D.   On: 07/24/2024 16:32   MR NECK SOFT TISSUE ONLY W WO CONTRAST Result Date: 07/21/2024 EXAM: MR NECK WITH AND WITHOUT INTRAVENOUS CONTRAST 07/17/2024 06:21:24 PM TECHNIQUE: Multiplanar multisequence MRI of the neck was performed with and without the administration of 7.5 mL gadobutrol  (GADAVIST ) 1 MMOL/ML injection. COMPARISON: PET CT fusion study dated 07/04/2024 and CT of the neck dated 06/19/2024. CLINICAL HISTORY: Head/neck cancer, monitor. Malignant neoplasm of tonsillar fossa (HCC), Squamous cell carcinoma of tonsil (HCC). FINDINGS: PHARYNX AND LARYNX: Nodular thickening and enhancement of the right palatine tonsil, involving the soft palate to the base of the uvula and the lateral and posterolateral oropharyngeal mucosa. The tongue is normal in appearance. The valleculae, epiglottis, aryepiglottic folds and pyriform sinuses appear unremarkable. The true and false vocal cords are normal in appearance. No mass or abscess is seen. SALIVARY GLANDS: There is a 7 mm nodule present posterolaterally within the superficial lobe of the parotid, which is evident on image 18 of series 3. It corresponds with the area of increased metabolic uptake on the previous PET/CT and is compatible with either primary parotid neoplasm or metastatic lymph node. The submandibular glands appear unremarkable. THYROID : The  thyroid  gland appears unremarkable. LYMPH NODES: No cervical or supraclavicular lymphadenopathy is seen. SOFT TISSUES: There is soft tissue fullness involving the left maxillary alveolar ridge, which is demonstrated on image 20 of series 3 and 7. It is well circumscribed and moderately hyperintense on T2, measuring approximately 2.8 x 2.5 cm. It involves the premolar region. There is no corresponding abnormality on the previous PET/CT. BRAIN, ORBITS AND SINUSES: The visualized portion of the intracranial contents appear unremarkable. The visualized portion of the orbits, paranasal sinuses and mastoid air cells demonstrate no acute abnormality. BONES: There is a lesion present anteriorly within the base of the dens, which is hypointense on T1, hyperintense on T2 and demonstrates contrast enhancement. There is no corresponding abnormality on the previous PET CT or CT of the neck. IMPRESSION: 1. Nodular thickening and enhancement of the right palatine tonsil extending to the soft palate, base of the uvula, and lateral/posterolateral oropharyngeal mucosa, compatible with oropharyngeal malignancy. 2. 7 mm nodule in the superficial lobe of the right parotid corresponding to prior PET uptake, suspicious for primary parotid neoplasm or metastatic lymph node. 3. Well-circumscribed 2.8 x 2.5 cm lesion of the left maxillary alveolar ridge in the premolar region; indeterminate. 4. Enhancing lesion at the anterior base of the dens; indeterminate. 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 A total of Versed  2 mg and Fentanyl  100 mcg was administered intravenously.  Moderate Sedation Time: 18 minutes. The patient's level of consciousness and vital signs were monitored continuously by radiology nursing throughout the procedure under my direct supervision. FLUOROSCOPY: Radiation Exposure Index and estimated peak skin dose (PSD); Reference air kerma (RAK), 0 mGy. COMPLICATIONS: None immediate. PROCEDURE: The procedure, risks, benefits, and alternatives were explained to the patient. Questions regarding the procedure were encouraged and answered. The patient understands and consents to the procedure. The RIGHT neck and chest were prepped with chlorhexidine  in a sterile fashion, and a sterile drape was applied covering the operative field. Maximum barrier sterile technique with sterile gowns and gloves were used for the procedure. A timeout was performed prior to the initiation of the procedure. Local anesthesia was provided with 1% lidocaine  with epinephrine . After creating a small venotomy incision, a micropuncture kit was utilized to access the internal jugular vein under direct, real-time ultrasound guidance. Ultrasound image documentation was performed. The microwire was kinked to measure appropriate catheter length. A subcutaneous port pocket was then created along the upper chest wall utilizing a combination of sharp and blunt dissection. The pocket was irrigated with sterile saline. A single lumen power injectable port was chosen for placement. The 8 Fr catheter was tunneled from the port pocket site to the venotomy incision. The port was placed in the pocket. The external catheter was trimmed to appropriate length. At the venotomy, an 8 Fr peel-away sheath was placed over a guidewire under fluoroscopic guidance. The catheter was then placed through the sheath and the sheath was removed. Final catheter positioning was confirmed and documented with a fluoroscopic spot radiograph. The port was accessed with a Huber needle, aspirated and flushed with heparinized saline. The  port pocket incision was closed with interrupted 3-0 Vicryl suture then Dermabond was applied, including at the venotomy incision. Dressings were placed. The patient tolerated the procedure well without immediate post procedural complication. IMPRESSION: Successful placement of a RIGHT internal jugular approach power injectable Port-A-Cath. The tip of the catheter is positioned within the proximal RIGHT atrium. The catheter is ready for immediate use. Thom Hall, MD [facility] Radiology Electronically Signed   By: Thom Hall M.D.   On: 07/19/2024 17:08   NM PET Image Restag (PS) Skull Base To Thigh Result Date: 07/06/2024 CLINICAL DATA:  Subsequent treatment strategy for and neck cancer. EXAM: NUCLEAR MEDICINE PET SKULL BASE TO THIGH TECHNIQUE: 7.9 mCi F-18 FDG was injected intravenously. Full-ring PET imaging was performed from the skull base to thigh after the radiotracer. CT data was obtained and used for attenuation correction and anatomic localization. Fasting blood glucose: 84 mg/dl COMPARISON:  PET-CT 11/02/7980 FINDINGS: Mediastinal blood pool activity: SUV max 2.3 Liver activity: SUV max NA NECK: Upper right parotid nodule or lymph node poorly appreciable on the CT data but with maximum SUV 9.9. Hypermetabolic lesion in the expected vicinity of the right right palatine tonsil with potential extension of by the uvula, maximum SUV 11.1. Incidental CT findings: None. CHEST: No significant abnormal hypermetabolic activity in this region. Incidental CT findings: Aortic and coronary atherosclerosis. Stable 3 mm left upper lobe nodule on image 25  series 7, benign. No further imaging workup of this lesion is indicated. ABDOMEN/PELVIS: No significant abnormal hypermetabolic activity in this region. Incidental CT findings: Cholecystectomy. Iliac atherosclerosis. Stellate 8 mm bladder calculus, image 195 series 4. Prostatomegaly with prominent median lobe indenting  the bladder base. SKELETON: No significant abnormal hypermetabolic activity in this region. Incidental CT findings: Bilateral pars defects at L5. Large hemangioma in the right L2 vertebra. IMPRESSION: 1. Hypermetabolic lesion in the expected vicinity of the right palatine tonsil with potential extension of by the uvula, maximum SUV 11.1. This is compatible with malignancy. 2. Hypermetabolic nodule in the upper right parotid gland, maximum SUV 9.9. This could be a primary parotid neoplasm or a metastatic lymph node. 3. No findings of metastatic disease to the chest, abdomen, or pelvis. 4. Stellate 8 mm bladder calculus. 5. Prostatomegaly with prominent median lobe indenting the bladder base. 6. Bilateral pars defects at L5. 7.  Aortic Atherosclerosis (ICD10-I70.0). Electronically Signed   By: Ryan Salvage M.D.   On: 07/06/2024 15:32     CODE STATUS:  Code Status History     Date Active Date Inactive Code Status Order ID Comments User Context   07/19/2024 1643 07/20/2024 0515 Full Code [id number]  Hughes Simmonds, MD [facility]   06/20/2023 1423 06/23/2023 1934 Full Code [id number]  Waddell Rake, MD ED   05/31/2023 1005 06/02/2023 0101 Full Code [id number]  Magda Debby SAILOR, MD Inpatient    Questions for Most Recent Historical Code Status (Order [id number])     Question Answer   By: Consent: discussion documented in EHR            Orders Placed This Encounter  Procedures   CBC with Differential ([facility] Only)    Standing Status:   Future    Expected Date:   08/24/2024    Expiration Date:   08/24/2025   Basic Metabolic Panel - [facility] Only    Standing Status:   Future    Expected Date:   08/24/2024    Expiration Date:   08/24/2025   Magnesium    Standing Status:   Future    Expected Date:   08/24/2024    Expiration Date:   08/24/2025   CBC with Differential ([facility])    Standing Status:   Future    Expected Date:   08/31/2024    Expiration Date:   08/31/2025   Basic  Metabolic Panel - [facility] Only    Standing Status:   Future    Expected Date:   08/31/2024    Expiration Date:   08/31/2025   Magnesium    Standing Status:   Future    Expected Date:   08/31/2024    Expiration Date:   08/31/2025   CBC with Differential ([facility])    Standing Status:   Future    Expected Date:   09/07/2024    Expiration Date:   09/07/2025   Basic Metabolic Panel - [facility]    Standing Status:   Future    Expected Date:   09/07/2024    Expiration Date:   09/07/2025   Magnesium    Standing Status:   Future    Expected Date:   09/07/2024    Expiration Date:   09/07/2025   CBC with Differential ([facility])    Standing Status:   Future    Expected Date:   09/14/2024    Expiration Date:   09/14/2025   Basic Metabolic Panel -  [facility] Only    Standing Status:   Future    Expected Date:   09/14/2024    Expiration Date:   09/14/2025   Magnesium    Standing Status:   Future    Expected Date:   09/14/2024    Expiration Date:   09/14/2025     Future Appointments  Date Time Provider Department [facility]  08/03/2024 11:50 AM [facility] [id number] [facility] None  08/04/2024  7:30 AM [facility] INFUSION [facility] None  08/04/2024  8:30 AM Karlene Few, LCSW [facility] None  08/04/2024  9:45 AM Ivonne Harlene RAMAN, RD [facility] None  08/04/2024  1:00 PM [facility] [id number] [facility] None  08/07/2024 11:30 AM [facility] [id number] [facility] None  08/07/2024 11:45 AM LINAC-SQUIRE [facility] None  08/08/2024 11:15 AM [facility] LINAC 3 [facility] None  08/09/2024 10:45 AM [facility] FLUSH [facility] None  08/09/2024 11:15 AM [facility] LINAC 3 [facility] None  08/09/2024 11:45 AM Seraphim Affinito, MD [facility] None  08/10/2024 11:15 AM [facility] LINAC 3 [facility] None  08/11/2024  7:30 AM [facility] INFUSION [facility] None  08/11/2024  2:30 PM Neff, Barbara L, RD [facility] None  08/11/2024  3:30 PM  [facility] LINAC 4 [facility] None  08/14/2024 11:15 AM [facility] LINAC 3 [facility] None  08/15/2024 11:15 AM [facility] LINAC 3 [facility] None  08/16/2024 11:15 AM [facility] LINAC 3 [facility] None  08/17/2024 10:45 AM [facility] FLUSH [facility] None  08/17/2024 11:15 AM [facility] LINAC 3 [facility] None  08/17/2024 11:45 AM Deigo Alonso, MD [facility] None  08/18/2024  7:30 AM [facility] INFUSION [facility] None  08/18/2024 12:00 PM Neff, Barbara L, RD [facility] None  08/18/2024  3:00 PM [facility] LINAC 4 [facility] None  08/21/2024 11:15 AM [facility] LINAC 3 [facility] None  08/22/2024 11:15 AM [facility] LINAC 3 [facility] None  08/23/2024 11:15 AM [facility] LINAC 3 [facility] None  08/24/2024 11:15 AM [facility] LINAC 3 [facility] None  08/25/2024 11:15 AM [facility] LINAC 3 [facility] None  08/28/2024 11:30 AM [facility] [id number] [facility] None  08/29/2024 11:15 AM [facility] LINAC 3 [facility] None  08/30/2024 11:15 AM [facility] LINAC 3 [facility] None  08/31/2024 11:15 AM [facility] LINAC 3 [facility] None  09/01/2024 11:15 AM [facility] LINAC 3 [facility] None  09/04/2024 11:15 AM [facility] LINAC 3 [facility] None  09/05/2024 11:15 AM [facility] LINAC 3 [facility] None  09/06/2024 11:15 AM [facility] LINAC 3 [facility] None  09/07/2024 11:15 AM [facility] LINAC 3 [facility] None  09/08/2024 11:15 AM [facility] LINAC 3 [facility] None  09/11/2024 11:15 AM [facility] LINAC 3 [facility] None  09/12/2024 11:15 AM [facility] LINAC 3 [facility] None  09/13/2024 11:15 AM [facility] LINAC 3 [facility] None  09/14/2024 11:15 AM [facility] LINAC 3 [facility] None  09/15/2024 11:15 AM [facility] LINAC 4 [facility] None  10/10/2024 10:00 AM Breedlove Blue, Blaire L, PT [facility] None      This document was completed utilizing speech recognition software. Grammatical errors, random word insertions, pronoun errors, and  incomplete sentences are an occasional consequence of this system due to software limitations, ambient noise, and hardware issues. Any formal questions or concerns about the content, text or information contained within the body of this dictation should be directly addressed to the provider for clarification.

## 2024-08-02 NOTE — Assessment & Plan Note (Signed)
 Pre-existing hearing loss with use of hearing aids. Cisplatin may exacerbate hearing loss, potentially leading to permanent changes. He is aware of the risk and plans to upgrade hearing aids post-treatment if necessary. - Monitor auditory function during treatment. - Consider hearing aid adjustment post-treatment if hearing loss worsens.

## 2024-08-03 ENCOUNTER — Ambulatory Visit
Admission: RE | Admit: 2024-08-03 | Discharge: 2024-08-03 | Disposition: A | Discharge status: Home / Self Care | Source: Ambulatory Visit | Attending: Radiation Oncology | Admitting: Radiation Oncology

## 2024-08-03 ENCOUNTER — Other Ambulatory Visit: Payer: Self-pay

## 2024-08-03 DIAGNOSIS — Z510 Encounter for antineoplastic radiation therapy: Secondary | ICD-10-CM | POA: Diagnosis not present

## 2024-08-03 LAB — RAD ONC ARIA SESSION SUMMARY
Course Elapsed Days: 1
Plan Fractions Treated to Date: 2
Plan Prescribed Dose Per Fraction: 2 Gy
Plan Total Fractions Prescribed: 33
Plan Total Prescribed Dose: 66 Gy
Reference Point Dosage Given to Date: 4 Gy
Reference Point Session Dosage Given: 2 Gy
Session Number: 2

## 2024-08-03 MED FILL — Fosaprepitant Dimeglumine For IV Infusion 150 MG (Base Eq): INTRAVENOUS | Qty: 5 | Status: AC

## 2024-08-04 ENCOUNTER — Inpatient Hospital Stay

## 2024-08-04 ENCOUNTER — Ambulatory Visit
Admission: RE | Admit: 2024-08-04 | Discharge: 2024-08-04 | Disposition: A | Discharge status: Home / Self Care | Source: Ambulatory Visit | Attending: Radiation Oncology | Admitting: Radiation Oncology

## 2024-08-04 ENCOUNTER — Inpatient Hospital Stay: Admitting: Dietician

## 2024-08-04 ENCOUNTER — Other Ambulatory Visit: Payer: Self-pay

## 2024-08-04 VITALS — BP 122/58 | HR 84 | Temp 97.7°F | Resp 16 | Wt 166.0 lb

## 2024-08-04 DIAGNOSIS — E538 Deficiency of other specified B group vitamins: Secondary | ICD-10-CM

## 2024-08-04 DIAGNOSIS — Z510 Encounter for antineoplastic radiation therapy: Secondary | ICD-10-CM | POA: Diagnosis not present

## 2024-08-04 DIAGNOSIS — C099 Malignant neoplasm of tonsil, unspecified: Secondary | ICD-10-CM

## 2024-08-04 LAB — RAD ONC ARIA SESSION SUMMARY
Course Elapsed Days: 2
Plan Fractions Treated to Date: 3
Plan Prescribed Dose Per Fraction: 2 Gy
Plan Total Fractions Prescribed: 33
Plan Total Prescribed Dose: 66 Gy
Reference Point Dosage Given to Date: 6 Gy
Reference Point Session Dosage Given: 2 Gy
Session Number: 3

## 2024-08-04 MED ORDER — CYANOCOBALAMIN 1000 MCG/ML IJ SOLN
1000.0000 ug | Freq: Once | INTRAMUSCULAR | Status: DC
  Filled 2024-08-04: qty 1

## 2024-08-04 MED ORDER — PALONOSETRON HCL INJECTION 0.25 MG/5ML
0.2500 mg | Freq: Once | INTRAVENOUS | Status: AC
  Administered 2024-08-04: 0.25 mg via INTRAVENOUS
  Filled 2024-08-04: qty 5

## 2024-08-04 MED ORDER — DEXAMETHASONE SOD PHOSPHATE PF 10 MG/ML IJ SOLN
10.0000 mg | Freq: Once | INTRAMUSCULAR | Status: AC
  Administered 2024-08-04: 10 mg via INTRAVENOUS

## 2024-08-04 MED ORDER — SODIUM CHLORIDE 0.9 % IV SOLN
150.0000 mg | Freq: Once | INTRAVENOUS | Status: AC
  Administered 2024-08-04: 150 mg via INTRAVENOUS
  Filled 2024-08-04: qty 150

## 2024-08-04 MED ORDER — POTASSIUM CHLORIDE IN NACL 20-0.9 MEQ/L-% IV SOLN
Freq: Once | INTRAVENOUS | Status: AC
  Filled 2024-08-04: qty 1000

## 2024-08-04 MED ORDER — MAGNESIUM SULFATE 2 GM/50ML IV SOLN
2.0000 g | Freq: Once | INTRAVENOUS | Status: AC
  Administered 2024-08-04: 2 g via INTRAVENOUS
  Filled 2024-08-04: qty 50

## 2024-08-04 MED ORDER — SODIUM CHLORIDE 0.9 % IV SOLN: INTRAVENOUS | Status: DC

## 2024-08-04 MED ORDER — SODIUM CHLORIDE 0.9 % IV SOLN
40.0000 mg/m2 | Freq: Once | INTRAVENOUS | Status: AC
  Administered 2024-08-04: 78 mg via INTRAVENOUS
  Filled 2024-08-04: qty 78

## 2024-08-04 NOTE — Progress Notes (Signed)
 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 He is receiving concurrent chemoradiation with weekly cisplatin. Patient is under the care of Dr. Izell and Dr. Autumn. He does not have feeding tube scheduled at this time.   Past medical history significant for SCC of left tonsil s/p chemoRT (2017)  Met with patient and wife in infusion. Patient reports no change in appetite. He is tolerating regular diet with small bites and chewing well. Patient has started swallowing exercises and feels swallow is stronger already. Typically eats 2-3 meals. Meets with friends for breakfast every morning (eggs, bacon, toast, coffee). Sometimes will miss lunch if he is outside working. Eats balanced dinner. Patient would like to avoid feeding tube placement. He had one while undergoing treatment in 2017. Says he felt like a robot and was very resistant to receiving nutrition this way.   Nutrition Focused Physical Exam: deferred    Medications: lipitor, atrovent, synthroid , zofran , compazine     Labs: 10/8 - reviewed   Anthropometrics:   Height: 5'11 Weight: 166 lb  UBW: 170-183 lb (2024) BMI: 23.15   Estimated Energy Needs  Kcals: 2110-2400 Protein: 90-105 Fluid: >/=2.1 L   NUTRITION DIAGNOSIS: Predicted sub-optimal intake related to recurrent HNC as evidenced by anticipated side effects of therapy likely to affect pt ability to eat and drink   INTERVENTION:  Discussed strategies for increasing calorie and protein energy intake with 5-6 small meals, eating by the clock or setting snack time alarms for reminders when outside working. Encourage packing cooler with drinks/snacks to have while working - snack ideas provided Encourage high calorie high protein supplements - samples of Ensure Complete + Boost VHC provided for pt to try (recommend daily at this time if tolerated). Suggested adding to coffee/ice cream to  maximize daily nutrition Discussed soft moist high protein foods for ease of intake - handout provided  Contact information  MONITORING, EVALUATION, GOAL: Pt will tolerate increased calorie/protein energy intake to minimize wt loss during treatment    Next Visit: Friday October 17 during infusion with Heron

## 2024-08-04 NOTE — Progress Notes (Signed)
 [facility] CSW Progress Note  Visual merchandiser met with patient  and spouse during infusion to follow-up on assessment of psychosocial needs.Patient is starting treatment in high spirits and is hopeful for a different experience with treatment. Patient and Spouse aware of CSW and no needs are present at this time. Patient aware CSW will touch base halfway through treatment for continuing monitoring. CSW available sooner if needed.    Follow Up Plan:  CSW will see patient on 10/31 assuming an infusion appt will be scheduled.    Lizbeth Sprague, LCSW Clinical Social Worker [facility]

## 2024-08-04 NOTE — Patient Instructions (Signed)
 [facility] - A DEPT OF [facility]  Discharge Instructions: Thank you for choosing [facility] to provide your oncology and hematology care.   If you have a lab appointment with the Cancer Center, please go directly to the Cancer Center and check in at the registration area.   Wear comfortable clothing and clothing appropriate for easy access to any Portacath or PICC line.   We strive to give you quality time with your provider. You may need to reschedule your appointment if you arrive late (15 or more minutes).  Arriving late affects you and other patients whose appointments are after yours.  Also, if you miss three or more appointments without notifying the office, you may be dismissed from the clinic at the provider's discretion.      For prescription refill requests, have your pharmacy contact our office and allow 72 hours for refills to be completed.    Today you received the following chemotherapy and/or immunotherapy agents: Cisplatin      To help prevent nausea and vomiting after your treatment, we encourage you to take your nausea medication as directed.  BELOW ARE SYMPTOMS THAT SHOULD BE REPORTED IMMEDIATELY: *FEVER GREATER THAN 100.4 F (38 C) OR HIGHER *CHILLS OR SWEATING *NAUSEA AND VOMITING THAT IS NOT CONTROLLED WITH YOUR NAUSEA MEDICATION *UNUSUAL SHORTNESS OF BREATH *UNUSUAL BRUISING OR BLEEDING *URINARY PROBLEMS (pain or burning when urinating, or frequent urination) *BOWEL PROBLEMS (unusual diarrhea, constipation, pain near the anus) TENDERNESS IN MOUTH AND THROAT WITH OR WITHOUT PRESENCE OF ULCERS (sore throat, sores in mouth, or a toothache) UNUSUAL RASH, SWELLING OR PAIN  UNUSUAL VAGINAL DISCHARGE OR ITCHING   Items with * indicate a potential emergency and should be followed up as soon as possible or go to the Emergency Department if any problems should occur.  Please show the CHEMOTHERAPY ALERT CARD or IMMUNOTHERAPY  ALERT CARD at check-in to the Emergency Department and triage nurse.  Should you have questions after your visit or need to cancel or reschedule your appointment, please contact [facility] - A DEPT OF [facility]  Dept: [phone]  and follow the prompts.  Office hours are 8:00 a.m. to 4:30 p.m. Monday - Friday. Please note that voicemails left after 4:00 p.m. may not be returned until the following business day.  We are closed weekends and major holidays. You have access to a nurse at all times for urgent questions. Please call the main number to the clinic Dept: [phone] and follow the prompts.   For any non-urgent questions, you may also contact your provider using MyChart. We now offer e-Visits for anyone 65 and older to request care online for non-urgent symptoms. For details visit [email or web address].   Also download the MyChart app! Go to the app store, search "MyChart", open the app, select [facility], and log in with your MyChart username and password.  Cisplatin Injection What is this medication? CISPLATIN (SIS pla tin) treats some types of cancer. It works by slowing down the growth of cancer cells. This medicine may be used for other purposes; ask your health care provider or pharmacist if you have questions. COMMON BRAND NAME(S): Platinol, Platinol -AQ What should I tell my care team before I take this medication? They need to know if you have any of these conditions: Eye disease, vision problems Hearing problems Kidney disease Low blood counts, such as low white cells, platelets, or red  blood cells Tingling of the fingers or toes, or other nerve disorder An unusual or allergic reaction to cisplatin, carboplatin, oxaliplatin, other medications, foods, dyes, or preservatives If you or your partner are pregnant or trying to get pregnant Breast-feeding How should I use this medication? This medication is injected into a vein. It is given  by your care team in a hospital or clinic setting. Talk to your care team about the use of this medication in children. Special care may be needed. Overdosage: If you think you have taken too much of this medicine contact a poison control center or emergency room at once. NOTE: This medicine is only for you. Do not share this medicine with others. What if I miss a dose? Keep appointments for follow-up doses. It is important not to miss your dose. Call your care team if you are unable to keep an appointment. What may interact with this medication? Do not take this medication with any of the following: Live virus vaccines This medication may also interact with the following: Certain antibiotics, such as amikacin, gentamicin, neomycin, polymyxin B, streptomycin, tobramycin, vancomycin Foscarnet This list may not describe all possible interactions. Give your health care provider a list of all the medicines, herbs, non-prescription drugs, or dietary supplements you use. Also tell them if you smoke, drink alcohol, or use illegal drugs. Some items may interact with your medicine. What should I watch for while using this medication? Your condition will be monitored carefully while you are receiving this medication. You may need blood work done while taking this medication. This medication may make you feel generally unwell. This is not uncommon, as chemotherapy can affect healthy cells as well as cancer cells. Report any side effects. Continue your course of treatment even though you feel ill unless your care team tells you to stop. This medication may increase your risk of getting an infection. Call your care team for advice if you get a fever, chills, sore throat, or other symptoms of a cold or flu. Do not treat yourself. Try to avoid being around people who are sick. Avoid taking medications that contain aspirin, acetaminophen, ibuprofen, naproxen, or ketoprofen unless instructed by your care team. These  medications may hide a fever. This medication may increase your risk to bruise or bleed. Call your care team if you notice any unusual bleeding. Be careful brushing or flossing your teeth or using a toothpick because you may get an infection or bleed more easily. If you have any dental work done, tell your dentist you are receiving this medication. Drink fluids as directed while you are taking this medication. This will help protect your kidneys. Call your care team if you get diarrhea. Do not treat yourself. Talk to your care team if you or your partner wish to become pregnant or think you might be pregnant. This medication can cause serious birth defects if taken during pregnancy and for 14 months after the last dose. A negative pregnancy test is required before starting this medication. A reliable form of contraception is recommended while taking this medication and for 14 months after the last dose. Talk to your care team about effective forms of contraception. Do not father a child while taking this medication and for 11 months after the last dose. Use a condom during sex during this time period. Do not breast-feed while taking this medication. This medication may cause infertility. Talk to your care team if you are concerned about your fertility. What side effects may I  notice from receiving this medication? Side effects that you should report to your care team as soon as possible: Allergic reactions--skin rash, itching, hives, swelling of the face, lips, tongue, or throat Eye pain, change in vision, vision loss Hearing loss, ringing in ears Infection--fever, chills, cough, sore throat, wounds that don't heal, pain or trouble when passing urine, general feeling of discomfort or being unwell Kidney injury--decrease in the amount of urine, swelling of the ankles, hands, or feet Low red blood cell level--unusual weakness or fatigue, dizziness, headache, trouble breathing Painful swelling, warmth,  or redness of the skin, blisters or sores at the infusion site Pain, tingling, or numbness in the hands or feet Unusual bruising or bleeding Side effects that usually do not require medical attention (report to your care team if they continue or are bothersome): Hair loss Nausea Vomiting This list may not describe all possible side effects. Call your doctor for medical advice about side effects. You may report side effects to FDA at 1-800-FDA-1088. Where should I keep my medication? This medication is given in a hospital or clinic. It will not be stored at home. NOTE: This sheet is a summary. It may not cover all possible information. If you have questions about this medicine, talk to your doctor, pharmacist, or health care provider.  2024 Elsevier/Gold Standard (2022-02-13 00:00:00)

## 2024-08-07 ENCOUNTER — Ambulatory Visit
Admission: RE | Admit: 2024-08-07 | Discharge: 2024-08-07 | Disposition: A | Source: Ambulatory Visit | Attending: Radiation Oncology

## 2024-08-07 ENCOUNTER — Ambulatory Visit
Admission: RE | Admit: 2024-08-07 | Discharge: 2024-08-07 | Disposition: A | Discharge status: Home / Self Care | Source: Ambulatory Visit | Attending: Radiation Oncology | Admitting: Radiation Oncology

## 2024-08-07 ENCOUNTER — Telehealth: Payer: Self-pay

## 2024-08-07 ENCOUNTER — Other Ambulatory Visit: Payer: Self-pay | Admitting: Radiation Oncology

## 2024-08-07 ENCOUNTER — Other Ambulatory Visit: Payer: Self-pay

## 2024-08-07 DIAGNOSIS — Z510 Encounter for antineoplastic radiation therapy: Secondary | ICD-10-CM | POA: Diagnosis not present

## 2024-08-07 DIAGNOSIS — C099 Malignant neoplasm of tonsil, unspecified: Secondary | ICD-10-CM

## 2024-08-07 LAB — RAD ONC ARIA SESSION SUMMARY
Course Elapsed Days: 5
Plan Fractions Treated to Date: 4
Plan Prescribed Dose Per Fraction: 2 Gy
Plan Total Fractions Prescribed: 33
Plan Total Prescribed Dose: 66 Gy
Reference Point Dosage Given to Date: 8 Gy
Reference Point Session Dosage Given: 2 Gy
Session Number: 4

## 2024-08-07 MED ORDER — LIDOCAINE VISCOUS HCL 2 % MT SOLN: OROMUCOSAL | 3 refills | Status: AC

## 2024-08-07 NOTE — Telephone Encounter (Signed)
 Beth knows to call the office at [phone] if they have any questions or concerns.

## 2024-08-07 NOTE — Telephone Encounter (Signed)
-----   Message from Nurse Ileana HERO sent at 08/04/2024  2:04 PM EDT ----- Regarding: First time Cisplatin pt of [facility] First time cisplatin pt of pasam tolerated well. Please follow up with patient when possible, thanks!

## 2024-08-08 ENCOUNTER — Ambulatory Visit
Admission: RE | Admit: 2024-08-08 | Discharge: 2024-08-08 | Disposition: A | Discharge status: Home / Self Care | Source: Ambulatory Visit | Attending: Radiation Oncology | Admitting: Radiation Oncology

## 2024-08-08 ENCOUNTER — Other Ambulatory Visit: Payer: Self-pay

## 2024-08-08 DIAGNOSIS — Z510 Encounter for antineoplastic radiation therapy: Secondary | ICD-10-CM | POA: Diagnosis not present

## 2024-08-08 LAB — RAD ONC ARIA SESSION SUMMARY
Course Elapsed Days: 6
Plan Fractions Treated to Date: 5
Plan Prescribed Dose Per Fraction: 2 Gy
Plan Total Fractions Prescribed: 33
Plan Total Prescribed Dose: 66 Gy
Reference Point Dosage Given to Date: 10 Gy
Reference Point Session Dosage Given: 2 Gy
Session Number: 5

## 2024-08-09 ENCOUNTER — Inpatient Hospital Stay (HOSPITAL_BASED_OUTPATIENT_CLINIC_OR_DEPARTMENT_OTHER): Admitting: Oncology

## 2024-08-09 ENCOUNTER — Inpatient Hospital Stay

## 2024-08-09 ENCOUNTER — Encounter: Payer: Self-pay | Admitting: Oncology

## 2024-08-09 ENCOUNTER — Other Ambulatory Visit: Payer: Self-pay

## 2024-08-09 ENCOUNTER — Ambulatory Visit
Admission: RE | Admit: 2024-08-09 | Discharge: 2024-08-09 | Disposition: A | Discharge status: Home / Self Care | Source: Ambulatory Visit | Attending: Radiation Oncology | Admitting: Radiation Oncology

## 2024-08-09 VITALS — BP 91/58 | HR 56 | Temp 97.6°F | Resp 18 | Ht 71.0 in | Wt 165.2 lb

## 2024-08-09 VITALS — BP 106/59 | HR 60 | Resp 14

## 2024-08-09 DIAGNOSIS — I959 Hypotension, unspecified: Secondary | ICD-10-CM | POA: Insufficient documentation

## 2024-08-09 DIAGNOSIS — Z510 Encounter for antineoplastic radiation therapy: Secondary | ICD-10-CM | POA: Diagnosis not present

## 2024-08-09 DIAGNOSIS — E538 Deficiency of other specified B group vitamins: Secondary | ICD-10-CM

## 2024-08-09 DIAGNOSIS — C099 Malignant neoplasm of tonsil, unspecified: Secondary | ICD-10-CM

## 2024-08-09 LAB — CBC WITH DIFFERENTIAL (CANCER CENTER ONLY)
Abs Immature Granulocytes: 0.02 K/uL (ref 0.00–0.07)
Basophils Absolute: 0 K/uL (ref 0.0–0.1)
Basophils Relative: 0 %
Eosinophils Absolute: 0 K/uL (ref 0.0–0.5)
Eosinophils Relative: 1 %
HCT: 38.8 % — ABNORMAL LOW (ref 39.0–52.0)
Hemoglobin: 13.4 g/dL (ref 13.0–17.0)
Immature Granulocytes: 0 %
Lymphocytes Relative: 9 %
Lymphs Abs: 0.6 K/uL — ABNORMAL LOW (ref 0.7–4.0)
MCH: 32.1 pg (ref 26.0–34.0)
MCHC: 34.5 g/dL (ref 30.0–36.0)
MCV: 92.8 fL (ref 80.0–100.0)
Monocytes Absolute: 0.5 K/uL (ref 0.1–1.0)
Monocytes Relative: 8 %
Neutro Abs: 5.3 K/uL (ref 1.7–7.7)
Neutrophils Relative %: 82 %
Platelet Count: 187 K/uL (ref 150–400)
RBC: 4.18 MIL/uL — ABNORMAL LOW (ref 4.22–5.81)
RDW: 12.5 % (ref 11.5–15.5)
WBC Count: 6.4 K/uL (ref 4.0–10.5)
nRBC: 0 % (ref 0.0–0.2)

## 2024-08-09 LAB — BASIC METABOLIC PANEL - CANCER CENTER ONLY
Anion gap: 4 — ABNORMAL LOW (ref 5–15)
BUN: 22 mg/dL (ref 8–23)
CO2: 29 mmol/L (ref 22–32)
Calcium: 9.4 mg/dL (ref 8.9–10.3)
Chloride: 102 mmol/L (ref 98–111)
Creatinine: 0.86 mg/dL (ref 0.61–1.24)
GFR, Estimated: >60 mL/min (ref >60)
Glucose, Bld: 122 mg/dL — ABNORMAL HIGH (ref 70–99)
Potassium: 4.2 mmol/L (ref 3.5–5.1)
Sodium: 135 mmol/L (ref 135–145)

## 2024-08-09 LAB — RAD ONC ARIA SESSION SUMMARY
Course Elapsed Days: 7
Plan Fractions Treated to Date: 6
Plan Prescribed Dose Per Fraction: 2 Gy
Plan Total Fractions Prescribed: 33
Plan Total Prescribed Dose: 66 Gy
Reference Point Dosage Given to Date: 12 Gy
Reference Point Session Dosage Given: 2 Gy
Session Number: 6

## 2024-08-09 LAB — MAGNESIUM: Magnesium: 2 mg/dL (ref 1.7–2.4)

## 2024-08-09 MED ORDER — ONDANSETRON HCL 4 MG/2ML IJ SOLN: 8.0000 mg | Freq: Once | INTRAMUSCULAR | Status: DC

## 2024-08-09 MED ORDER — SODIUM CHLORIDE 0.9 % IV SOLN: Freq: Once | INTRAVENOUS | Status: DC

## 2024-08-09 MED ORDER — SODIUM CHLORIDE 0.9 % IV SOLN: Freq: Once | INTRAVENOUS | Status: AC

## 2024-08-09 NOTE — Assessment & Plan Note (Signed)
 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  He continued to have follow-ups up until 2022 with no evidence of disease clinically and on imaging.  Previously treated by Dr. Lonn in our clinic.  Now with a recurrence on the right side, p16 negative.  No evidence of metastatic disease on PET scan.  He was seen in consultation by Dr. Lauralee (surgical ENT) on 06/28/24 to discuss surgical treatment options. Although surgery would likely provide him with the best outcome, Dr. 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 In this setting, Dr. Lauralee would like him to be considered for re-irradiation.   Patient did receive radiation to the bilateral cervical lymph nodes in the past.  Dr. Izell evaluated the patient and plan is for reirradiation for current recurrence.  His case was discussed in our ENT tumor conference on 07/12/2024.  Consensus opinion is to proceed with concurrent chemoradiation with cisplatin.  We have discussed about role of cisplatin being a radiosensitizer in the treatment of head and neck cancer.  We have discussed about the curative intent of chemoradiation for this patient.  Patient is willing to proceed with weekly cisplatin. Goal is to avoid surgery and achieve remission. Weekly administration allows dose adjustments based on tolerance and side effects, aiming for a less severe experience compared to high-dose regimens.  His case was discussed in tumor conference on 07/12/2024.  Plan made to proceed with MRI of the neck for further evaluation and delineation of the tonsil mass.  Also plan for biopsy of the parotid lesion.  On 07/17/2024, MRI of the neck showed nodular thickening and enhancement of the right palatine tonsil extending to the soft palate, base of the uvula, and lateral/posterolateral oropharyngeal mucosa, compatible  with oropharyngeal malignancy. 7 mm nodule in the superficial lobe of the right parotid corresponding to prior PET uptake, suspicious for primary parotid neoplasm or metastatic lymph node.  On 07/24/2024, core biopsy of the right parotid lesion showed bland oncocytic lesion, benign finding.  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   Plan made to proceed with concurrent chemoradiation using weekly cisplatin.  Started radiation treatments from 08/02/2024.  Received cycle 1 of cisplatin on 08/04/2024.  Tolerated it reasonably well.  Labs today reveal no dose-limiting toxicities.  He is scheduled for cycle #2 of cisplatin on 08/11/2024.  Will proceed with chemotherapy as scheduled.  He was noted to be hypotensive in the clinic today.  We gave him 1 L of NS bolus.  Encouraged adequate hydration at home.  - Monitor kidney function and blood counts weekly - Ensure hydration with 70-80 ounces of fluid daily - Prescribed antiemetics for nausea management  - Pre-schedule feeding tube placement for 5th or 6th week of treatment if needed  I will see him in 1 week for follow-up with repeat labs and continuation of chemotherapy.

## 2024-08-09 NOTE — Progress Notes (Signed)
 [facility]  ONCOLOGY CLINIC PROGRESS NOTE   Patient Care Team: Marvene Prentice SAUNDERS, FNP as PCP - General (Family Medicine) Carlie Clark, MD as Consulting Physician (Otolaryngology) Izell Domino, MD as Attending Physician (Radiation Oncology) Daryle Heron CROME, RD as Dietitian (Nutrition) Malmfelt, Delon CROME, RN as Oncology Nurse Navigator [name], MD as Consulting Physician (Oncology) Lauralee Chew, MD as Referring Physician (Otolaryngology)  PATIENT NAME: Gregory Jenkins   MR#: [id number] DOB: 1950/12/14  Date of visit: 08/09/2024   ASSESSMENT & PLAN:   Gregory Jenkins is a 73 y.o. gentleman with a past medical history of left tonsillar squamous cell carcinoma, managed with concurrent chemoradiation using cetuximab  in 2017, went into remission, was referred to our clinic for recently diagnosed right tonsillar cancer. cT2,cN0,cM0,p16- , Stage II.   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  He continued to have follow-ups up until 2022 with no evidence of disease clinically and on imaging.  Previously treated by Dr. Lonn in our clinic.  Now with a recurrence on the right side, p16 negative.  No evidence of metastatic disease on PET scan.  He was seen in consultation by Dr. Lauralee (surgical ENT) on 06/28/24 to discuss surgical treatment options. Although surgery would likely provide him with the best outcome, Dr. 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 In this setting, Dr. Lauralee would like him to be considered for re-irradiation.   Patient did receive radiation to the bilateral cervical lymph nodes in the past.  Dr. Izell evaluated the patient and plan is for reirradiation for current recurrence.  His case was discussed in our ENT tumor conference on  07/12/2024.  Consensus opinion is to proceed with concurrent chemoradiation with cisplatin.  We have discussed about role of cisplatin being a radiosensitizer in the treatment of head and neck cancer.  We have discussed about the curative intent of chemoradiation for this patient.  Patient is willing to proceed with weekly cisplatin. Goal is to avoid surgery and achieve remission. Weekly administration allows dose adjustments based on tolerance and side effects, aiming for a less severe experience compared to high-dose regimens.  His case was discussed in tumor conference on 07/12/2024.  Plan made to proceed with MRI of the neck for further evaluation and delineation of the tonsil mass.  Also plan for biopsy of the parotid lesion.  On 07/17/2024, MRI of the neck showed nodular thickening and enhancement of the right palatine tonsil extending to the soft palate, base of the uvula, and lateral/posterolateral oropharyngeal mucosa, compatible with oropharyngeal malignancy. 7 mm nodule in the superficial lobe of the right parotid corresponding to prior PET uptake, suspicious for primary parotid neoplasm or metastatic lymph node.  On 07/24/2024, core biopsy of the right parotid lesion showed bland oncocytic lesion, benign finding.  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   Plan made to proceed with concurrent chemoradiation using weekly cisplatin.  Started radiation treatments from 08/02/2024.  Received cycle 1 of cisplatin on 08/04/2024.  Tolerated it reasonably well.  Labs today reveal no dose-limiting toxicities.  He is scheduled for cycle #2 of cisplatin on 08/11/2024.  Will proceed with chemotherapy as scheduled.  He was noted to be hypotensive in the clinic today.  We gave him 1 L of NS bolus.  Encouraged adequate hydration at home.  -  Monitor kidney function and blood counts weekly - Ensure hydration with 70-80 ounces of fluid daily - Prescribed antiemetics for nausea  management  - Pre-schedule feeding tube placement for 5th or 6th week of treatment if needed  I will see him in 1 week for follow-up with repeat labs and continuation of chemotherapy.  Hypotension His blood pressure was 91/58 today.  We gave him 1 L of NS bolus in clinic with improvement in blood pressure.  He was advised to maintain adequate hydration at home.  He will continue to receive fluids during chemotherapy.  Constipation during cancer treatment Constipation was managed with Senokot, but overuse led to loose stools. Bowel movements are returning to normal. - Monitor bowel movements and adjust Senokot use as needed  Vitamin B12 deficiency Vitamin B12 deficiency managed with monthly B12 injections. Coordination with oncology clinic to administer B12 injections during treatment visits to reduce additional appointments. - Administer B12 injection at oncology clinic starting in October - Coordinate with family doctor to transition B12 administration to oncology clinic    I reviewed lab results and outside records for this visit and discussed relevant results with the patient. Diagnosis, plan of care and treatment options were also discussed in detail with the patient. Opportunity provided to ask questions and answers provided to his apparent satisfaction. Provided instructions to call our clinic with any problems, questions or concerns prior to return visit. I recommended to continue follow-up with PCP and sub-specialists. He verbalized understanding and agreed with the plan.   NCCN guidelines have been consulted in the planning of this patient's care.  I spent a total of 40 minutes during this encounter with the patient including review of chart and various tests results, discussions about plan of care and coordination of care plan.   [name], MD  08/09/2024 4:10 PM  [facility] - A DEPT [facility] [address] [place] [address] Dept: [phone] Dept Fax: [phone]    CHIEF COMPLAINT/ REASON FOR VISIT:   Recently diagnosed right tonsillar cancer. cT2,cN0,cM0,p16- , Stage II. Has history of left-sided tonsillar cancer treated in 2017 with chemoradiation using cetuximab .   Current Treatment: Started concurrent chemoradiation with weekly cisplatin from 08/02/2024.  INTERVAL HISTORY:    Discussed the use of AI scribe software for clinical note transcription with the patient, who gave verbal consent to proceed.  History of Present Illness  Gregory Jenkins is a 73 year old male undergoing treatment for cancer who presents for follow-up regarding his current treatment regimen.  He has had a challenging week but managed well with nausea, using anti-nausea medication as needed. He took a dose on Saturday for three days and another on Tuesday. Currently, he has no nausea.  He experiences low energy levels, which is a concern during his treatment.  He experienced constipation, which has been managed with Senokot and is now resolved.  He is maintaining his nutrition and hydration well, eating adequately and staying hydrated. He is trying to reduce sugary drinks and caffeine. He inquires about the safety of consuming warm coffee, which he misses. He is considering using drinks like Propel and Gatorade for electrolytes, and he prefers Propel. Liquid IVs are also discussed as an option for hydration.    I have reviewed the past medical history, past surgical history, social history and family history with the patient and they are unchanged from previous note.  HISTORY OF PRESENT  ILLNESS:   ONCOLOGY HISTORY:    Initial diagnosis of left tonsillar cancer in 2017 for which he received radiation therapy to the left tonsil and involved bilateral cervical lymph nodes. This was concurrent with systemic therapy consisting of cetuximab . He was last seen by Rad Onc for a follow-up visit on  10/02/2016 and was NED on examination and imaging at that time.    He continued to follow with Dr. Carlie (ENT) and with Dr. Lonn under surveillance until 2022. He did have follow-up imaging performed during that interval of time which consisted of a follow-up neck CT on 02/13/2019 that showed NED and stable post treatment changes in the neck.    He did however return to Dr. Ilona office on 02/24/22 for evaluation of a spot on the back of his tongue. However, oral exam performed at that time did not show any abnormal findings to correlate with the reported concern.    He presented to Dr. Ilona office on 06/07/24 with a 3 month history of a sore throat which he characterized as feeling raw on the right side of his throat (with occasional extension to the roof of his mouth), as well as ear pressure. Oral exam performed at that time noted an exudative mass of the right tonsillar fossa. A biopsy of the right tonsillar mass was accordingly obtained at that time that showed findings consistent with invasive squamous cell carcinoma (moderately differentiated); p16 negative.    Soft tissue neck CT with contrast on 06/19/24 demonstrated: the right palatine tonsillar mass extending onto the soft palate and into the right glossotonsillar sulcus, consistent with SCC, measuring at least 26 mm. No abnormal lymph nodes were demonstrated in the neck. A chest CT was also performed that same day which showed several indeterminate punctate pulmonary nodules, but no definite evidence of pulmonary metastatic disease.    He was seen in consultation by Dr. Lauralee (surgical ENT) on 06/28/24 to discuss surgical treatment options. Although surgery would likely provide him with the best outcome, Dr. 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 In this setting, Dr. Lauralee would like him to be considered for re-irradiation. He has also been  referred back to medical oncology.   PET scan performed on 07/04/24 demonstrated: the hypermetabolic lesion in the vicinity of the right palatine tonsil consistent with malignancy, and with potential extension to the uvula, and a hypermetabolic nodule in the upper right parotid gland with potential differential considerations including primary parotid neoplasm vs a metastatic lymph node noted. Imaging otherwise showed no evidence of metastatic disease to the chest, abdomen, or pelvis. Other findings of potential clinical significance included a bladder calculus and prostatomegaly with a prominent median lobe indenting the bladder base.    His case was discussed in tumor conference on 07/12/2024.  Plan made to proceed with MRI of the neck for further evaluation and delineation of the tonsil mass.  Also plan for biopsy of the parotid lesion.  On 07/17/2024, MRI of the neck showed nodular thickening and enhancement of the right palatine tonsil extending to the soft palate, base of the uvula, and lateral/posterolateral oropharyngeal mucosa, compatible with oropharyngeal malignancy. 7 mm nodule in the superficial lobe of the right parotid corresponding to prior PET uptake, suspicious for primary parotid neoplasm or metastatic lymph node.  On 07/24/2024, core biopsy of the right parotid lesion showed bland oncocytic lesion, benign finding.  cT2,cN0,cM0,p16- right tonsillar squamous cell carcinoma.  Stage  II.   Plan made to proceed with concurrent chemoradiation using weekly cisplatin.  Started this from 08/02/2024.   Oncology History  Malignant neoplasm of tonsillar fossa (HCC)  11/12/2015 Procedure   Accession: [id number] FNA of left neck LN positive for squamous cell cancer   11/18/2015 Imaging   Left greater than right cervical lymphadenopathy suspicious for nodal metastatic disease.2. Mild asymmetry of the left tonsil -- correlate with direct visualization to assess for primary neoplasm.   11/20/2015  Procedure   He has left tonsil biopsy   11/20/2015 Pathology Results   Accession: [id number] Left tonsil biopsy showed squamous cell cancer, p16 positive.   11/28/2015 PET scan   1. Left palatine tonsil primary with left worse than right cervical nodal metastasis. 2. No extracervical hypermetabolic metastasis.    12/12/2015 Procedure   Peg and port-a-cath placement.   12/18/2015 - 01/29/2016 Chemotherapy   He received weekly cetuximab    12/18/2015 - 02/03/2016 Radiation Therapy   Received Helicle IMRT Tomotherapy:  Left tonsil and bilateral neck / 70 Gy in 35 fractions to gross disease, 63 Gy in 35 fractions to high risk nodal echelons, and 56 Gy in 35 fractions to intermediate risk nodal echelons.   12/31/2015 Adverse Reaction   Treatment #3 is placed on hold due to worsening mucositis   01/08/2016 Miscellaneous   Cetuximab  #3 resumed with 50% dose adjustment   01/14/2016 Adverse Reaction   Treatment #4 is placed on hold due to worsening mucositis and failure to thrive   03/20/2016 Imaging   CT abdomen showed mild gallbladder wall thickening with pericholecystic fluid and several gallstones. The gallbladder is nondistended. Recommend clinical correlation for acute or chronic cholecystitis.   04/15/2016 Procedure   PEG removed.   05/26/2016 Imaging   Restaging PET:  Marked interval improvement with resolution of left tonsillar hypermetabolism and right cervical lymph node hypermetabolism. Left cervical lymph nodes have decreased substantially in size and there is a subtle degree of FDG hypermetabolism still associated with these left-sided cervical nodes.   10/12/2016 Procedure   Port-a-cath removed.   02/14/2019 Imaging   CT neck 1. Satisfactory post treatment appearance of the neck. NI-Rads category 1. 2. No mass or abnormality to correspond to the marked area of concern at the level of the right submandibular gland.     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  ALLERGIES: He has no known allergies.  MEDICATIONS:  Current Outpatient Medications  Medication Sig Dispense Refill   acetaminophen  (TYLENOL ) 500 MG tablet Take 1,000 mg by mouth every 6 (six) hours as needed for moderate pain.     aspirin  EC 81 MG tablet Take 81 mg by mouth daily.     atorvastatin  (LIPITOR) 10 MG tablet Take 10 mg by mouth daily.     cyanocobalamin (VITAMIN B12) 1000 MCG/ML injection Inject 1,000 mcg into the muscle every 30 (thirty) days.     dexamethasone  (DECADRON ) 4 MG  tablet Take 2 tablets (8 mg) by mouth daily x 3 days starting the day after cisplatin chemotherapy. Take with food. 30 tablet 1   ipratropium (ATROVENT) 0.06 % nasal spray Place 2 sprays into both nostrils daily as needed for rhinitis.     levothyroxine  (SYNTHROID ) 125 MCG tablet Take 125 mcg by mouth daily before breakfast.  3   lidocaine  (XYLOCAINE ) 2 % solution Patient: Mix 1part 2% viscous lidocaine , 1part H20. Swish & swallow 10mL of diluted mixture, 30min before meals and at bedtime, up to QID 200 mL 3   lidocaine -prilocaine  (EMLA ) cream Apply to affected area once 30 g 3   ondansetron  (ZOFRAN ) 8 MG tablet Take 1 tablet (8 mg total) by mouth every 8 (eight) hours as needed for nausea or vomiting. Start on the third day after cisplatin. 30 tablet 1   prochlorperazine  (COMPAZINE ) 10 MG tablet Take 1 tablet (10 mg total) by mouth every 6 (six) hours as needed (Nausea or vomiting). 30 tablet 1   No current  facility-administered medications for this visit.   Facility-Administered Medications Ordered in Other Visits  Medication Dose Route Frequency Provider Last Rate Last Admin   ondansetron  (ZOFRAN ) injection 8 mg  8 mg Intravenous Once Maks Cavallero, MD         VITALS:   Blood pressure (!) 91/58, pulse (!) 56, temperature 97.6 F (36.4 C), temperature source Tympanic, resp. rate 18, height 5' 11 (1.803 m), weight 165 lb 3.2 oz (74.9 kg), SpO2 100%.  Wt Readings from Last 3 Encounters:  08/09/24 165 lb 3.2 oz (74.9 kg)  08/04/24 166 lb (75.3 kg)  08/02/24 166 lb (75.3 kg)    Body mass index is 23.04 kg/m.    Onc Performance Status - 08/09/24 1152       ECOG Perf Status   ECOG Perf Status Fully active, able to carry on all pre-disease performance without restriction      KPS SCALE   KPS % SCORE Normal, no compliants, no evidence of disease          PHYSICAL EXAM:   Physical Exam Constitutional:      General: He is not in acute distress.    Appearance: Normal appearance.  HENT:     Head: Normocephalic and atraumatic.     Mouth/Throat:     Comments: Tumor arising from the majority of the right tonsil, with the edge starting to abut the uvula. Tongue midline. No trismus Eyes:     Conjunctiva/sclera: Conjunctivae normal.  Neck:     Comments: Post radiation therapy fibrosis noted in the neck area, well-healed.   No definite palpable cervical lymphadenopathy Cardiovascular:     Rate and Rhythm: Normal rate and regular rhythm.  Pulmonary:     Effort: Pulmonary effort is normal. No respiratory distress.  Abdominal:     General: There is no distension.  Neurological:     General: No focal deficit present.     Mental Status: He is alert and oriented to person, place, and time.  Psychiatric:        Mood and Affect: Mood normal.        Behavior: Behavior normal.      LABORATORY DATA:   I have reviewed the data as listed.  Results for orders placed or performed  in visit on 08/09/24  Rad Onc Aria Session Summary  Result Value Ref Range   Course ID [id number]    Course Start Date 07/18/2024    Session Number 6  Course First Treatment Date 08/02/2024  3:17 PM    Course Last Treatment Date 08/09/2024 11:26 AM    Course Elapsed Days 7    Reference Point ID HN_R_Tonsil DP    Reference Point Dosage Given to Date 12 Gy   Reference Point Session Dosage Given 2 Gy   Plan ID HN_R_Tonsil    Plan Name HN_R_Tonsil    Plan Fractions Treated to Date 6    Plan Total Fractions Prescribed 33    Plan Prescribed Dose Per Fraction 2 Gy   Plan Total Prescribed Dose 66.000000 Gy   Plan Primary Reference Point HN_R_Tonsil DP   Results for orders placed or performed in visit on 08/09/24  Magnesium  Result Value Ref Range   Magnesium 2.0 1.7 - 2.4 mg/dL  Basic Metabolic Panel - Cancer Center Only  Result Value Ref Range   Sodium 135 135 - 145 mmol/L   Potassium 4.2 3.5 - 5.1 mmol/L   Chloride 102 98 - 111 mmol/L   CO2 29 22 - 32 mmol/L   Glucose, Bld 122 (H) 70 - 99 mg/dL   BUN 22 8 - 23 mg/dL   Creatinine 9.13 9.38 - 1.24 mg/dL   Calcium  9.4 8.9 - 10.3 mg/dL   GFR, Estimated >39 >39 mL/min   Anion gap 4 (L) 5 - 15  CBC with Differential (Cancer Center Only)  Result Value Ref Range   WBC Count 6.4 4.0 - 10.5 K/uL   RBC 4.18 (L) 4.22 - 5.81 MIL/uL   Hemoglobin 13.4 13.0 - 17.0 g/dL   HCT 61.1 (L) 60.9 - 47.9 %   MCV 92.8 80.0 - 100.0 fL   MCH 32.1 26.0 - 34.0 pg   MCHC 34.5 30.0 - 36.0 g/dL   RDW 87.4 88.4 - 84.4 %   Platelet Count 187 150 - 400 K/uL   nRBC 0.0 0.0 - 0.2 %   Neutrophils Relative % 82 %   Neutro Abs 5.3 1.7 - 7.7 K/uL   Lymphocytes Relative 9 %   Lymphs Abs 0.6 (L) 0.7 - 4.0 K/uL   Monocytes Relative 8 %   Monocytes Absolute 0.5 0.1 - 1.0 K/uL   Eosinophils Relative 1 %   Eosinophils Absolute 0.0 0.0 - 0.5 K/uL   Basophils Relative 0 %   Basophils Absolute 0.0 0.0 - 0.1 K/uL   Immature Granulocytes 0 %   Abs Immature Granulocytes  0.02 0.00 - 0.07 K/uL      RADIOGRAPHIC STUDIES:  I have personally reviewed the radiological images as listed and agree with the findings in the report.  US  CORE BIOPSY (SALIVARY GLAND/PAROTID GLAND) Result Date: 07/24/2024 INDICATION: History of tonsillar cancel with hypermetabolic lymph node in the right parotid gland. EXAM: Ultrasound-guided core biopsy MEDICATIONS: None. ANESTHESIA/SEDATION: None FLUOROSCOPY TIME:  None COMPLICATIONS: None immediate. PROCEDURE: Informed written consent was obtained from the patient after a thorough discussion of the procedural risks, benefits and alternatives. All questions were addressed. Maximal Sterile Barrier Technique was utilized including caps, mask, sterile gowns, sterile gloves, sterile drape, hand hygiene and skin antiseptic. A timeout was performed prior to the initiation of the procedure. Ultrasound was used to interrogate the right parotid gland. A small hypoechoic nodule is visible immediately anterior to the tragus of the ear. Skin was sterilely prepped draped in the standard fashion with chlorhexidine . Local anesthesia was attained by infiltration with 1% lidocaine . A small dermatotomy made. Under real-time ultrasound guidance, multiple 18 gauge core biopsies were obtained using the automated biopsy  device. Biopsy specimens were placed on a Telfa pad and delivered to pathology for further analysis. Post biopsy ultrasound imaging demonstrates no evidence of complication. IMPRESSION: Successful ultrasound-guided core biopsy of right parotid nodule. Electronically Signed   By: Wilkie Lent M.D.   On: 07/24/2024 16:32   MR NECK SOFT TISSUE ONLY W WO CONTRAST Result Date: 07/21/2024 EXAM: MR NECK WITH AND WITHOUT INTRAVENOUS CONTRAST 07/17/2024 06:21:24 PM TECHNIQUE: Multiplanar multisequence MRI of the neck was performed with and without the administration of 7.5 mL gadobutrol  (GADAVIST ) 1 MMOL/ML injection. COMPARISON: PET CT fusion study dated  07/04/2024 and CT of the neck dated 06/19/2024. CLINICAL HISTORY: Head/neck cancer, monitor. Malignant neoplasm of tonsillar fossa (HCC), Squamous cell carcinoma of tonsil (HCC). FINDINGS: PHARYNX AND LARYNX: Nodular thickening and enhancement of the right palatine tonsil, involving the soft palate to the base of the uvula and the lateral and posterolateral oropharyngeal mucosa. The tongue is normal in appearance. The valleculae, epiglottis, aryepiglottic folds and pyriform sinuses appear unremarkable. The true and false vocal cords are normal in appearance. No mass or abscess is seen. SALIVARY GLANDS: There is a 7 mm nodule present posterolaterally within the superficial lobe of the parotid, which is evident on image 18 of series 3. It corresponds with the area of increased metabolic uptake on the previous PET/CT and is compatible with either primary parotid neoplasm or metastatic lymph node. The submandibular glands appear unremarkable. THYROID : The thyroid  gland appears unremarkable. LYMPH NODES: No cervical or supraclavicular lymphadenopathy is seen. SOFT TISSUES: There is soft tissue fullness involving the left maxillary alveolar ridge, which is demonstrated on image 20 of series 3 and 7. It is well circumscribed and moderately hyperintense on T2, measuring approximately 2.8 x 2.5 cm. It involves the premolar region. There is no corresponding abnormality on the previous PET/CT. BRAIN, ORBITS AND SINUSES: The visualized portion of the intracranial contents appear unremarkable. The visualized portion of the orbits, paranasal sinuses and mastoid air cells demonstrate no acute abnormality. BONES: There is a lesion present anteriorly within the base of the dens, which is hypointense on T1, hyperintense on T2 and demonstrates contrast enhancement. There is no corresponding abnormality on the previous PET CT or CT of the neck. IMPRESSION: 1. Nodular thickening and enhancement of the right palatine tonsil extending to  the soft palate, base of the uvula, and lateral/posterolateral oropharyngeal mucosa, compatible with oropharyngeal malignancy. 2. 7 mm nodule in the superficial lobe of the right parotid corresponding to prior PET uptake, suspicious for primary parotid neoplasm or metastatic lymph node. 3. Well-circumscribed 2.8 x 2.5 cm lesion of the left maxillary alveolar ridge in the premolar region; indeterminate. 4. Enhancing lesion at the anterior base of the dens; indeterminate. Electronically signed by: Evalene Coho MD 07/21/2024 05:17 AM EDT RP Workstation: [id number]   IR IMAGING GUIDED PORT INSERTION Result Date: 07/19/2024 INDICATION: he will receive chemotherapy for head and neck cancer EXAM: IMPLANTED PORT A CATH PLACEMENT WITH ULTRASOUND AND FLUOROSCOPIC GUIDANCE MEDICATIONS: None ANESTHESIA/SEDATION: Moderate (conscious) sedation was employed during this procedure. A total of Versed  2 mg and Fentanyl  100 mcg was administered intravenously. Moderate Sedation Time: 18 minutes. The patient's level of consciousness and vital signs were monitored continuously by radiology nursing throughout the procedure under my direct supervision. FLUOROSCOPY: Radiation Exposure Index and estimated peak skin dose (PSD); Reference air kerma (RAK), 0 mGy. COMPLICATIONS: None immediate. PROCEDURE: The procedure, risks, benefits, and alternatives were explained to the patient. Questions regarding the procedure were encouraged and answered. The patient  understands and consents to the procedure. The RIGHT neck and chest were prepped with chlorhexidine  in a sterile fashion, and a sterile drape was applied covering the operative field. Maximum barrier sterile technique with sterile gowns and gloves were used for the procedure. A timeout was performed prior to the initiation of the procedure. Local anesthesia was provided with 1% lidocaine  with epinephrine . After creating a small venotomy incision, a micropuncture kit was utilized to  access the internal jugular vein under direct, real-time ultrasound guidance. Ultrasound image documentation was performed. The microwire was kinked to measure appropriate catheter length. A subcutaneous port pocket was then created along the upper chest wall utilizing a combination of sharp and blunt dissection. The pocket was irrigated with sterile saline. A single lumen power injectable port was chosen for placement. The 8 Fr catheter was tunneled from the port pocket site to the venotomy incision. The port was placed in the pocket. The external catheter was trimmed to appropriate length. At the venotomy, an 8 Fr peel-away sheath was placed over a guidewire under fluoroscopic guidance. The catheter was then placed through the sheath and the sheath was removed. Final catheter positioning was confirmed and documented with a fluoroscopic spot radiograph. The port was accessed with a Huber needle, aspirated and flushed with heparinized saline. The port pocket incision was closed with interrupted 3-0 Vicryl suture then Dermabond was applied, including at the venotomy incision. Dressings were placed. The patient tolerated the procedure well without immediate post procedural complication. IMPRESSION: Successful placement of a RIGHT internal jugular approach power injectable Port-A-Cath. The tip of the catheter is positioned within the proximal RIGHT atrium. The catheter is ready for immediate use. Thom Hall, MD [facility] Electronically Signed   By: Thom Hall M.D.   On: 07/19/2024 17:08     CODE STATUS:  Code Status History     Date Active Date Inactive Code Status Order ID Comments User Context   07/19/2024 1643 07/20/2024 0515 Full Code [id number]  Hall Thom, MD [facility]   06/20/2023 1423 06/23/2023 1934 Full Code [id number]  Waddell Rake, MD ED   05/31/2023 1005 06/02/2023 0101 Full Code [id number]  Magda Debby SAILOR, MD Inpatient    Questions for Most  Recent Historical Code Status (Order [id number])     Question Answer   By: Consent: discussion documented in EHR            No orders of the defined types were placed in this encounter.    Future Appointments  Date Time Provider Department Center  08/10/2024 11:15 AM [facility] LINAC 3 [facility] None  08/11/2024  7:30 AM [facility] INFUSION [facility] None  08/11/2024  2:30 PM Neff, Barbara L, RD [facility] None  08/11/2024  3:30 PM [facility] LINAC 4 [facility] None  08/14/2024 11:15 AM [facility] LINAC 3 [facility] None  08/14/2024 11:40 AM LINAC-SQUIRE [facility] None  08/15/2024 11:15 AM [facility] LINAC 3 [facility] None  08/16/2024 11:15 AM [facility] LINAC 3 [facility] None  08/17/2024 10:45 AM [facility] FLUSH [facility] None  08/17/2024 11:15 AM [facility] LINAC 3 [facility] None  08/17/2024 11:45 AM Jakylah Bassinger, MD [facility] None  08/18/2024  7:30 AM [facility] INFUSION [facility] None  08/18/2024 12:00 PM Neff, Barbara L, RD [facility] None  08/18/2024  2:40 PM [facility] LINAC 4 [facility] None  08/21/2024 11:15 AM [facility] LINAC 3 [facility] None  08/22/2024 11:15 AM [facility] LINAC 3 [facility] None  08/23/2024 11:15 AM [facility] LINAC 3 [facility] None  08/24/2024 11:15 AM [facility]  LINAC 3 [facility] None  08/25/2024  9:00 AM Abdul, Lizbeth, LCSW [facility] None  08/25/2024 11:15 AM [facility] LINAC 3 [facility] None  08/28/2024 11:30 AM [facility] [id number] [facility] None  08/29/2024 11:15 AM [facility] LINAC 3 [facility] None  08/30/2024 11:15 AM [facility] LINAC 3 [facility] None  08/31/2024 11:15 AM [facility] LINAC 3 [facility] None  09/01/2024 11:15 AM [facility] LINAC 3 [facility] None  09/04/2024 11:15 AM [facility] LINAC 3 [facility] None  09/05/2024 11:15 AM [facility] LINAC 3 [facility] None  09/06/2024 11:15 AM [facility] LINAC 3 [facility] None  09/07/2024 11:15 AM [facility] LINAC 3  [facility] None  09/08/2024 11:15 AM [facility] LINAC 3 [facility] None  09/11/2024 11:15 AM [facility] LINAC 3 [facility] None  09/12/2024 11:15 AM [facility] LINAC 3 [facility] None  09/13/2024 11:15 AM [facility] LINAC 3 [facility] None  09/14/2024 11:15 AM [facility] LINAC 3 [facility] None  09/15/2024 11:15 AM [facility] LINAC 4 [facility] None  10/10/2024 10:00 AM Breedlove Blue, Blaire L, PT [id number] None      This document was completed utilizing speech recognition software. Grammatical errors, random word insertions, pronoun errors, and incomplete sentences are an occasional consequence of this system due to software limitations, ambient noise, and hardware issues. Any formal questions or concerns about the content, text or information contained within the body of this dictation should be directly addressed to the provider for clarification.

## 2024-08-09 NOTE — Assessment & Plan Note (Addendum)
 His blood pressure was 91/58 today.  We gave him 1 L of NS bolus in clinic with improvement in blood pressure.  He was advised to maintain adequate hydration at home.  He will continue to receive fluids during chemotherapy.

## 2024-08-09 NOTE — Patient Instructions (Signed)

## 2024-08-10 ENCOUNTER — Other Ambulatory Visit: Payer: Self-pay

## 2024-08-10 ENCOUNTER — Ambulatory Visit
Admission: RE | Admit: 2024-08-10 | Discharge: 2024-08-10 | Disposition: A | Discharge status: Home / Self Care | Source: Ambulatory Visit | Attending: Radiation Oncology | Admitting: Radiation Oncology

## 2024-08-10 DIAGNOSIS — Z510 Encounter for antineoplastic radiation therapy: Secondary | ICD-10-CM | POA: Diagnosis not present

## 2024-08-10 LAB — RAD ONC ARIA SESSION SUMMARY
Course Elapsed Days: 8
Plan Fractions Treated to Date: 7
Plan Prescribed Dose Per Fraction: 2 Gy
Plan Total Fractions Prescribed: 33
Plan Total Prescribed Dose: 66 Gy
Reference Point Dosage Given to Date: 14 Gy
Reference Point Session Dosage Given: 2 Gy
Session Number: 7

## 2024-08-10 MED FILL — Fosaprepitant Dimeglumine For IV Infusion 150 MG (Base Eq): INTRAVENOUS | Qty: 5 | Status: AC

## 2024-08-11 ENCOUNTER — Inpatient Hospital Stay: Admitting: Nutrition

## 2024-08-11 ENCOUNTER — Other Ambulatory Visit: Payer: Self-pay

## 2024-08-11 ENCOUNTER — Ambulatory Visit
Admission: RE | Admit: 2024-08-11 | Discharge: 2024-08-11 | Disposition: A | Source: Ambulatory Visit | Attending: Radiation Oncology

## 2024-08-11 ENCOUNTER — Inpatient Hospital Stay

## 2024-08-11 VITALS — BP 123/65 | HR 62 | Temp 97.8°F | Resp 14

## 2024-08-11 DIAGNOSIS — C099 Malignant neoplasm of tonsil, unspecified: Secondary | ICD-10-CM

## 2024-08-11 DIAGNOSIS — Z510 Encounter for antineoplastic radiation therapy: Secondary | ICD-10-CM | POA: Diagnosis not present

## 2024-08-11 LAB — RAD ONC ARIA SESSION SUMMARY
Course Elapsed Days: 9
Plan Fractions Treated to Date: 8
Plan Prescribed Dose Per Fraction: 2 Gy
Plan Total Fractions Prescribed: 33
Plan Total Prescribed Dose: 66 Gy
Reference Point Dosage Given to Date: 16 Gy
Reference Point Session Dosage Given: 2 Gy
Session Number: 8

## 2024-08-11 MED ORDER — PALONOSETRON HCL INJECTION 0.25 MG/5ML
0.2500 mg | Freq: Once | INTRAVENOUS | Status: AC
  Administered 2024-08-11: 0.25 mg via INTRAVENOUS
  Filled 2024-08-11: qty 5

## 2024-08-11 MED ORDER — MAGNESIUM SULFATE 2 GM/50ML IV SOLN
2.0000 g | Freq: Once | INTRAVENOUS | Status: AC
  Administered 2024-08-11: 2 g via INTRAVENOUS
  Filled 2024-08-11: qty 50

## 2024-08-11 MED ORDER — SODIUM CHLORIDE 0.9 % IV SOLN
150.0000 mg | Freq: Once | INTRAVENOUS | Status: AC
  Administered 2024-08-11: 150 mg via INTRAVENOUS
  Filled 2024-08-11: qty 150

## 2024-08-11 MED ORDER — DEXAMETHASONE SOD PHOSPHATE PF 10 MG/ML IJ SOLN
10.0000 mg | Freq: Once | INTRAMUSCULAR | Status: AC
  Administered 2024-08-11: 10 mg via INTRAVENOUS

## 2024-08-11 MED ORDER — SODIUM CHLORIDE 0.9 % IV SOLN: INTRAVENOUS | Status: DC

## 2024-08-11 MED ORDER — POTASSIUM CHLORIDE IN NACL 20-0.9 MEQ/L-% IV SOLN
Freq: Once | INTRAVENOUS | Status: AC
  Filled 2024-08-11: qty 1000

## 2024-08-11 MED ORDER — SODIUM CHLORIDE 0.9 % IV SOLN
40.0000 mg/m2 | Freq: Once | INTRAVENOUS | Status: AC
  Administered 2024-08-11: 78 mg via INTRAVENOUS
  Filled 2024-08-11: qty 78

## 2024-08-11 NOTE — Progress Notes (Signed)
 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  He receives concurrent chemoradiation therapy with weekly cisplatin.  He is followed by Dr. Izell and Dr. Autumn.  No feeding tube  Weight:  165 pounds 3.2 ounces October 15 166 pounds October 10  Labs include glucose 122 magnesium 2.0  Estimated nutrition needs: 2110-2400 cal, 90-105 g protein, greater than 2.1 L fluid.  Patient denies nausea and vomiting.  He is managing constipation with Senokot.  Reports his mouth is dry and he is noticing slight soreness on the roof of his mouth.  He continues to do swallowing exercises and feels he is able to swallow without having food hung up.  Patient continues to eat a very good breakfast.  His wife makes a sandwich and cuts it up into pieces and patient is able to eat this midmorning and mid afternoon.  He is trying to eat lunch and dinner as well.  He has lidocaine  to minimize pain with swallowing.  He is willing to do what ever he can to avoid a feeding tube.  Nutrition diagnosis: Predicted sub-optimal energy intake, ongoing.  Intervention: Encouraged patient to continue swallowing exercises per speech therapy. Begin baking soda and salt water gargle before eating and every a.m. and p.m. Educated on other strategies for helping with dry mouth and provided nutrition fact sheet. Recommend patient begin 1 oral nutrition supplement daily.  He would like to try boost [facility] so provided samples. Questions answered.  Contact information given.  Monitoring, evaluation, goals: Patient will tolerate adequate calories and protein to minimize weight loss  Next visit: Friday, October 24 during infusion.  **Disclaimer: This note was dictated with voice recognition software. Similar sounding words can inadvertently be transcribed and this note may contain transcription errors which may not have been corrected upon publication of note.**

## 2024-08-14 ENCOUNTER — Other Ambulatory Visit: Payer: Self-pay

## 2024-08-14 ENCOUNTER — Ambulatory Visit
Admission: RE | Admit: 2024-08-14 | Discharge: 2024-08-14 | Disposition: A | Discharge status: Home / Self Care | Source: Ambulatory Visit | Attending: Radiation Oncology | Admitting: Radiation Oncology

## 2024-08-14 ENCOUNTER — Ambulatory Visit
Admission: RE | Admit: 2024-08-14 | Discharge: 2024-08-14 | Disposition: A | Source: Ambulatory Visit | Attending: Radiation Oncology

## 2024-08-14 DIAGNOSIS — Z510 Encounter for antineoplastic radiation therapy: Secondary | ICD-10-CM | POA: Diagnosis not present

## 2024-08-14 LAB — RAD ONC ARIA SESSION SUMMARY
Course Elapsed Days: 12
Plan Fractions Treated to Date: 9
Plan Prescribed Dose Per Fraction: 2 Gy
Plan Total Fractions Prescribed: 33
Plan Total Prescribed Dose: 66 Gy
Reference Point Dosage Given to Date: 18 Gy
Reference Point Session Dosage Given: 2 Gy
Session Number: 9

## 2024-08-15 ENCOUNTER — Other Ambulatory Visit: Payer: Self-pay

## 2024-08-15 ENCOUNTER — Ambulatory Visit
Admission: RE | Admit: 2024-08-15 | Discharge: 2024-08-15 | Disposition: A | Discharge status: Home / Self Care | Source: Ambulatory Visit | Attending: Radiation Oncology | Admitting: Radiation Oncology

## 2024-08-15 DIAGNOSIS — Z510 Encounter for antineoplastic radiation therapy: Secondary | ICD-10-CM | POA: Diagnosis not present

## 2024-08-15 LAB — RAD ONC ARIA SESSION SUMMARY
Course Elapsed Days: 13
Plan Fractions Treated to Date: 10
Plan Prescribed Dose Per Fraction: 2 Gy
Plan Total Fractions Prescribed: 33
Plan Total Prescribed Dose: 66 Gy
Reference Point Dosage Given to Date: 20 Gy
Reference Point Session Dosage Given: 2 Gy
Session Number: 10

## 2024-08-16 ENCOUNTER — Other Ambulatory Visit: Payer: Self-pay

## 2024-08-16 ENCOUNTER — Ambulatory Visit
Admission: RE | Admit: 2024-08-16 | Discharge: 2024-08-16 | Disposition: A | Discharge status: Home / Self Care | Source: Ambulatory Visit | Attending: Radiation Oncology | Admitting: Radiation Oncology

## 2024-08-16 DIAGNOSIS — Z510 Encounter for antineoplastic radiation therapy: Secondary | ICD-10-CM | POA: Diagnosis not present

## 2024-08-16 LAB — RAD ONC ARIA SESSION SUMMARY
Course Elapsed Days: 14
Plan Fractions Treated to Date: 11
Plan Prescribed Dose Per Fraction: 2 Gy
Plan Total Fractions Prescribed: 33
Plan Total Prescribed Dose: 66 Gy
Reference Point Dosage Given to Date: 22 Gy
Reference Point Session Dosage Given: 2 Gy
Session Number: 11

## 2024-08-17 ENCOUNTER — Other Ambulatory Visit: Payer: Self-pay

## 2024-08-17 ENCOUNTER — Ambulatory Visit
Admission: RE | Admit: 2024-08-17 | Discharge: 2024-08-17 | Disposition: A | Discharge status: Home / Self Care | Source: Ambulatory Visit | Attending: Radiation Oncology | Admitting: Radiation Oncology

## 2024-08-17 ENCOUNTER — Inpatient Hospital Stay

## 2024-08-17 ENCOUNTER — Encounter: Payer: Self-pay | Admitting: Oncology

## 2024-08-17 ENCOUNTER — Inpatient Hospital Stay: Admitting: Oncology

## 2024-08-17 VITALS — BP 101/64 | HR 64 | Temp 97.9°F | Resp 13 | Ht 71.0 in | Wt 163.1 lb

## 2024-08-17 DIAGNOSIS — C099 Malignant neoplasm of tonsil, unspecified: Secondary | ICD-10-CM

## 2024-08-17 DIAGNOSIS — Z510 Encounter for antineoplastic radiation therapy: Secondary | ICD-10-CM | POA: Diagnosis not present

## 2024-08-17 LAB — CBC WITH DIFFERENTIAL (CANCER CENTER ONLY)
Abs Immature Granulocytes: 0.02 K/uL (ref 0.00–0.07)
Basophils Absolute: 0 K/uL (ref 0.0–0.1)
Basophils Relative: 0 %
Eosinophils Absolute: 0 K/uL (ref 0.0–0.5)
Eosinophils Relative: 0 %
HCT: 41.8 % (ref 39.0–52.0)
Hemoglobin: 14.6 g/dL (ref 13.0–17.0)
Immature Granulocytes: 0 %
Lymphocytes Relative: 7 %
Lymphs Abs: 0.6 K/uL — ABNORMAL LOW (ref 0.7–4.0)
MCH: 32.4 pg (ref 26.0–34.0)
MCHC: 34.9 g/dL (ref 30.0–36.0)
MCV: 92.9 fL (ref 80.0–100.0)
Monocytes Absolute: 0.5 K/uL (ref 0.1–1.0)
Monocytes Relative: 5 %
Neutro Abs: 7.2 K/uL (ref 1.7–7.7)
Neutrophils Relative %: 88 %
Platelet Count: 147 K/uL — ABNORMAL LOW (ref 150–400)
RBC: 4.5 MIL/uL (ref 4.22–5.81)
RDW: 12.6 % (ref 11.5–15.5)
WBC Count: 8.3 K/uL (ref 4.0–10.5)
nRBC: 0 % (ref 0.0–0.2)

## 2024-08-17 LAB — RAD ONC ARIA SESSION SUMMARY
Course Elapsed Days: 15
Plan Fractions Treated to Date: 12
Plan Prescribed Dose Per Fraction: 2 Gy
Plan Total Fractions Prescribed: 33
Plan Total Prescribed Dose: 66 Gy
Reference Point Dosage Given to Date: 24 Gy
Reference Point Session Dosage Given: 2 Gy
Session Number: 12

## 2024-08-17 LAB — BASIC METABOLIC PANEL - CANCER CENTER ONLY
Anion gap: 4 — ABNORMAL LOW (ref 5–15)
BUN: 22 mg/dL (ref 8–23)
CO2: 30 mmol/L (ref 22–32)
Calcium: 9.4 mg/dL (ref 8.9–10.3)
Chloride: 102 mmol/L (ref 98–111)
Creatinine: 0.9 mg/dL (ref 0.61–1.24)
GFR, Estimated: >60 mL/min (ref >60)
Glucose, Bld: 127 mg/dL — ABNORMAL HIGH (ref 70–99)
Potassium: 4.4 mmol/L (ref 3.5–5.1)
Sodium: 136 mmol/L (ref 135–145)

## 2024-08-17 LAB — MAGNESIUM: Magnesium: 2.2 mg/dL (ref 1.7–2.4)

## 2024-08-17 MED FILL — Fosaprepitant Dimeglumine For IV Infusion 150 MG (Base Eq): INTRAVENOUS | Qty: 5 | Status: AC

## 2024-08-17 NOTE — Assessment & Plan Note (Addendum)
 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  He continued to have follow-ups up until 2022 with no evidence of disease clinically and on imaging.  Previously treated by Dr. Lonn in our clinic.  Now with a recurrence on the right side, p16 negative.  No evidence of metastatic disease on PET scan.  Please review oncology history for additional details and timeline of events.  He was seen in consultation by Dr. Lauralee (surgical ENT) on 06/28/24 to discuss surgical treatment options. Although surgery would likely provide him with the best outcome, Dr. 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 In this setting, Dr. Lauralee would like him to be considered for re-irradiation.   His case was discussed in our ENT tumor conference on 07/12/2024.  Consensus opinion is to proceed with concurrent chemoradiation with cisplatin.  On 07/17/2024, MRI of the neck showed nodular thickening and enhancement of the right palatine tonsil extending to the soft palate, base of the uvula, and lateral/posterolateral oropharyngeal mucosa, compatible with oropharyngeal malignancy. 7 mm nodule in the superficial lobe of the right parotid corresponding to prior PET uptake, suspicious for primary parotid neoplasm or metastatic lymph node.  On 07/24/2024, core biopsy of the right parotid lesion showed bland oncocytic lesion, benign finding.  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   Plan made to proceed with concurrent chemoradiation using weekly cisplatin.  Started radiation treatments from 08/02/2024.  Received cycle 1 of cisplatin on 08/04/2024.   He has been tolerating treatments reasonably well without major side effects.  Labs today reveal no dose-limiting toxicities.  He is scheduled for cycle # 3 of cisplatin tomorrow, 08/18/2024.  Will  proceed with chemotherapy as scheduled.  1 L of NS bolus given last week for hypotension and this has helped with his symptoms.  We will arrange for 1 L of NS bolus at least once a week.  - Monitor kidney function and blood counts weekly - Ensure hydration with 70-80 ounces of fluid daily - Prescribed antiemetics for nausea management  - Pre-schedule feeding tube placement for 5th or 6th week of treatment if needed  I will see him in 1 week for follow-up with repeat labs and continuation of chemotherapy.

## 2024-08-17 NOTE — Progress Notes (Signed)
 [facility]  ONCOLOGY CLINIC PROGRESS NOTE   Patient Care Team: Gregory Prentice SAUNDERS, FNP as PCP - General (Family Medicine) Gregory Clark, Jenkins as Consulting Physician (Otolaryngology) Gregory Domino, Jenkins as Attending Physician (Radiation Oncology) Gregory Jenkins, RD as Dietitian (Nutrition) Malmfelt, Delon CROME, RN as Oncology Nurse Navigator Gregory Millman, Jenkins as Consulting Physician (Oncology) Gregory Chew, Jenkins as Referring Physician (Otolaryngology)  PATIENT NAME: Gregory Jenkins   MR#: [id number] DOB: 08/06/1951  Date of visit: 08/17/2024   ASSESSMENT & PLAN:   Gregory Jenkins is a 73 y.o. gentleman with a past medical history of left tonsillar squamous cell carcinoma, managed with concurrent chemoradiation using cetuximab  in 2017, went into remission, was referred to our clinic for recently diagnosed right tonsillar cancer. cT2,cN0,cM0,p16- , Stage II.   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  He continued to have follow-ups up until 2022 with no evidence of disease clinically and on imaging.  Previously treated by Dr. Lonn in our clinic.  Now with a recurrence on the right side, p16 negative.  No evidence of metastatic disease on PET scan.  Please review oncology history for additional details and timeline of events.  He was seen in consultation by Dr. Lauralee (surgical ENT) on 06/28/24 to discuss surgical treatment options. Although surgery would likely provide him with the best outcome, Dr. 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 In this setting, Dr. Lauralee would like him to be considered for re-irradiation.   His case was discussed in our ENT tumor conference on 07/12/2024.  Consensus opinion is to proceed with concurrent chemoradiation with  cisplatin.  On 07/17/2024, MRI of the neck showed nodular thickening and enhancement of the right palatine tonsil extending to the soft palate, base of the uvula, and lateral/posterolateral oropharyngeal mucosa, compatible with oropharyngeal malignancy. 7 mm nodule in the superficial lobe of the right parotid corresponding to prior PET uptake, suspicious for primary parotid neoplasm or metastatic lymph node.  On 07/24/2024, core biopsy of the right parotid lesion showed bland oncocytic lesion, benign finding.  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   Plan made to proceed with concurrent chemoradiation using weekly cisplatin.  Started radiation treatments from 08/02/2024.  Received cycle 1 of cisplatin on 08/04/2024.   He has been tolerating treatments reasonably well without major side effects.  Labs today reveal no dose-limiting toxicities.  He is scheduled for cycle # 3 of cisplatin tomorrow, 08/18/2024.  Will proceed with chemotherapy as scheduled.  1 L of NS bolus given last week for hypotension and this has helped with his symptoms.  We will arrange for 1 L of NS bolus at least once a week.  - Monitor kidney function and blood counts weekly - Ensure hydration with 70-80 ounces of fluid daily - Prescribed antiemetics for nausea management  - Pre-schedule feeding tube placement for 5th or 6th week of treatment if needed  I will see him in 1 week for follow-up with repeat labs and continuation of chemotherapy.  Vitamin B12 deficiency Vitamin B12 deficiency managed with monthly B12 injections. Coordination with oncology clinic to administer B12 injections during treatment visits to reduce additional appointments. - Administer B12 injection at oncology clinic starting in October - Coordinate with family doctor to transition B12 administration to oncology clinic    I  reviewed lab results and outside records for this visit and discussed relevant results with the  patient. Diagnosis, plan of care and treatment options were also discussed in detail with the patient. Opportunity provided to ask questions and answers provided to his apparent satisfaction. Provided instructions to call our clinic with any problems, questions or concerns prior to return visit. I recommended to continue follow-up with PCP and sub-specialists. He verbalized understanding and agreed with the plan.   NCCN guidelines have been consulted in the planning of this patient's care.  I spent a total of 30 minutes during this encounter with the patient including review of chart and various tests results, discussions about plan of care and coordination of care plan.   Gregory Jenkins  08/17/2024 12:49 PM  [facility] - A DEPT OF Gregory Jenkins [address] [place] [address] Dept: [phone] Dept Fax: [phone]    CHIEF COMPLAINT/ REASON FOR VISIT:   Recently diagnosed right tonsillar cancer. cT2,cN0,cM0,p16- , Stage II. Has history of left-sided tonsillar cancer treated in 2017 with chemoradiation using cetuximab .   Current Treatment: Started concurrent chemoradiation with weekly cisplatin from 08/02/2024.  INTERVAL HISTORY:    Discussed the use of AI scribe software for clinical note transcription with the patient, who gave verbal consent to proceed.  History of Present Illness  Gregory Jenkins is a 73 year old male undergoing chemotherapy who presents for follow-up regarding treatment side effects. He is accompanied by a caregiver.  He is currently undergoing chemotherapy and received IV fluids last Wednesday, which resulted in two good days following the treatment. He underwent chemotherapy on Friday and had a good day despite the treatment, but experienced fatigue on Saturday.  He is experiencing challenges with Miralax, as it caused significant diarrhea yesterday. This has been a struggle to  balance, as the Miralax 'kicked his butt literally'. He is eating small meals without nausea, although he experienced diarrhea recently. No nausea or soreness in swallowing, but he confirms experiencing diarrhea.  Regarding his throat and mouth, he does not have sores like he did previously, which is an improvement. He is trying to maintain his condition without further complications.  There is a discussion about potentially scheduling a feeding tube, but it has not been decided yet. The caregiver mentions that last year, even with a feeding tube, maintaining his weight was challenging.   I have reviewed the past medical history, past surgical history, social history and family history with the patient and they are unchanged from previous note.  HISTORY OF PRESENT ILLNESS:   ONCOLOGY HISTORY:   Initial diagnosis of left tonsillar cancer in 2017 for which he received radiation therapy to the left tonsil and involved bilateral cervical lymph nodes. This was concurrent with systemic therapy consisting of cetuximab . He was last seen by Rad Onc for a follow-up visit on 10/02/2016 and was NED on examination and imaging at that time.    He continued to follow with Dr. Carlie (ENT) and with Dr. Lonn under surveillance until 2022. He did have follow-up imaging performed during that interval of time which consisted of a follow-up neck CT on 02/13/2019 that showed NED and stable post treatment changes in the neck.    He did however return to Dr. Ilona office on 02/24/22 for evaluation of a spot on the back of his tongue. However, oral exam performed at that time did not show any abnormal findings  to correlate with the reported concern.    He presented to Dr. Ilona office on 06/07/24 with a 3 month history of a sore throat which he characterized as feeling raw on the right side of his throat (with occasional extension to the roof of his mouth), as well as ear pressure. Oral exam performed at that time  noted an exudative mass of the right tonsillar fossa. A biopsy of the right tonsillar mass was accordingly obtained at that time that showed findings consistent with invasive squamous cell carcinoma (moderately differentiated); p16 negative.    Soft tissue neck CT with contrast on 06/19/24 demonstrated: the right palatine tonsillar mass extending onto the soft palate and into the right glossotonsillar sulcus, consistent with SCC, measuring at least 26 mm. No abnormal lymph nodes were demonstrated in the neck. A chest CT was also performed that same day which showed several indeterminate punctate pulmonary nodules, but no definite evidence of pulmonary metastatic disease.    He was seen in consultation by Dr. Lauralee (surgical ENT) on 06/28/24 to discuss surgical treatment options. Although surgery would likely provide him with the best outcome, Dr. 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 In this setting, Dr. Lauralee would like him to be considered for re-irradiation. He has also been referred back to medical oncology.   PET scan performed on 07/04/24 demonstrated: the hypermetabolic lesion in the vicinity of the right palatine tonsil consistent with malignancy, and with potential extension to the uvula, and a hypermetabolic nodule in the upper right parotid gland with potential differential considerations including primary parotid neoplasm vs a metastatic lymph node noted. Imaging otherwise showed no evidence of metastatic disease to the chest, abdomen, or pelvis. Other findings of potential clinical significance included a bladder calculus and prostatomegaly with a prominent median lobe indenting the bladder base.    His case was discussed in tumor conference on 07/12/2024.  Plan made to proceed with MRI of the neck for further evaluation and delineation of the tonsil mass.  Also plan for biopsy of the parotid lesion.  On  07/17/2024, MRI of the neck showed nodular thickening and enhancement of the right palatine tonsil extending to the soft palate, base of the uvula, and lateral/posterolateral oropharyngeal mucosa, compatible with oropharyngeal malignancy. 7 mm nodule in the superficial lobe of the right parotid corresponding to prior PET uptake, suspicious for primary parotid neoplasm or metastatic lymph node.  On 07/24/2024, core biopsy of the right parotid lesion showed bland oncocytic lesion, benign finding.  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   Plan made to proceed with concurrent chemoradiation using weekly cisplatin.  Started this from 08/02/2024.   Oncology History  Malignant neoplasm of tonsillar fossa (HCC)  11/12/2015 Procedure   Accession: [id number] FNA of left neck LN positive for squamous cell cancer   11/18/2015 Imaging   Left greater than right cervical lymphadenopathy suspicious for nodal metastatic disease.2. Mild asymmetry of the left tonsil -- correlate with direct visualization to assess for primary neoplasm.   11/20/2015 Procedure   He has left tonsil biopsy   11/20/2015 Pathology Results   Accession: [id number] Left tonsil biopsy showed squamous cell cancer, p16 positive.   11/28/2015 PET scan   1. Left palatine tonsil primary with left worse than right cervical nodal metastasis. 2. No extracervical hypermetabolic metastasis.    12/12/2015 Procedure   Peg and port-a-cath placement.   12/18/2015 - 01/29/2016 Chemotherapy  He received weekly cetuximab    12/18/2015 - 02/03/2016 Radiation Therapy   Received Helicle IMRT Tomotherapy:  Left tonsil and bilateral neck / 70 Gy in 35 fractions to gross disease, 63 Gy in 35 fractions to high risk nodal echelons, and 56 Gy in 35 fractions to intermediate risk nodal echelons.   12/31/2015 Adverse Reaction   Treatment #3 is placed on hold due to worsening mucositis   01/08/2016 Miscellaneous   Cetuximab  #3 resumed with  50% dose adjustment   01/14/2016 Adverse Reaction   Treatment #4 is placed on hold due to worsening mucositis and failure to thrive   03/20/2016 Imaging   CT abdomen showed mild gallbladder wall thickening with pericholecystic fluid and several gallstones. The gallbladder is nondistended. Recommend clinical correlation for acute or chronic cholecystitis.   04/15/2016 Procedure   PEG removed.   05/26/2016 Imaging   Restaging PET:  Marked interval improvement with resolution of left tonsillar hypermetabolism and right cervical lymph node hypermetabolism. Left cervical lymph nodes have decreased substantially in size and there is a subtle degree of FDG hypermetabolism still associated with these left-sided cervical nodes.   10/12/2016 Procedure   Port-a-cath removed.   02/14/2019 Imaging   CT neck 1. Satisfactory post treatment appearance of the neck. NI-Rads category 1. 2. No mass or abnormality to correspond to the marked area of concern at the level of the right submandibular gland.     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  ALLERGIES: He has no known allergies.  MEDICATIONS:  Current Outpatient Medications  Medication Sig Dispense Refill   acetaminophen  (TYLENOL ) 500 MG tablet Take 1,000 mg by mouth every 6 (six) hours as needed for moderate pain.     aspirin  EC 81 MG tablet Take 81 mg by mouth daily.     atorvastatin  (LIPITOR) 10 MG tablet Take 10 mg by mouth  daily.     cyanocobalamin (VITAMIN B12) 1000 MCG/ML injection Inject 1,000 mcg into the muscle every 30 (thirty) days.     dexamethasone  (DECADRON ) 4 MG tablet Take 2 tablets (8 mg) by mouth daily x 3 days starting the day after cisplatin chemotherapy. Take with food. 30 tablet 1   ipratropium (ATROVENT) 0.06 % nasal spray Place 2 sprays into both nostrils daily as needed for rhinitis.     levothyroxine  (SYNTHROID ) 125 MCG tablet Take 125 mcg by mouth daily before breakfast.  3   lidocaine  (XYLOCAINE ) 2 % solution Patient: Mix 1part 2% viscous lidocaine , 1part H20. Swish & swallow 10mL of diluted mixture, 30min before meals and at bedtime, up to QID 200 mL 3   lidocaine -prilocaine  (EMLA ) cream Apply to affected area once 30 g 3   ondansetron  (ZOFRAN ) 8 MG tablet Take 1 tablet (8 mg total) by mouth every 8 (eight) hours as needed for nausea or vomiting. Start on the third day after cisplatin. 30 tablet 1   prochlorperazine  (COMPAZINE ) 10 MG tablet Take  1 tablet (10 mg total) by mouth every 6 (six) hours as needed (Nausea or vomiting). 30 tablet 1   No current facility-administered medications for this visit.     VITALS:   Blood pressure 101/64, pulse 64, temperature 97.9 F (36.6 C), temperature source Temporal, resp. rate 13, height 5' 11 (1.803 m), weight 163 lb 1.6 oz (74 kg), SpO2 100%.  Wt Readings from Last 3 Encounters:  08/17/24 163 lb 1.6 oz (74 kg)  08/09/24 165 lb 3.2 oz (74.9 kg)  08/04/24 166 lb (75.3 kg)    Body mass index is 22.75 kg/m.    Onc Performance Status - 08/17/24 1143       ECOG Perf Status   ECOG Perf Status Restricted in physically strenuous activity but ambulatory and able to carry out work of a light or sedentary nature, e.g., light house work, office work      KPS SCALE   KPS % SCORE Normal activity with effort, some s/s of disease           PHYSICAL EXAM:   Physical Exam Constitutional:      General: He is not in acute distress.     Appearance: Normal appearance.  HENT:     Head: Normocephalic and atraumatic.     Mouth/Throat:     Comments: Tumor arising from the majority of the right tonsil, with the edge starting to abut the uvula. Tongue midline. No trismus Eyes:     Conjunctiva/sclera: Conjunctivae normal.  Neck:     Comments: Post radiation therapy fibrosis noted in the neck area, well-healed.   No definite palpable cervical lymphadenopathy Cardiovascular:     Rate and Rhythm: Normal rate and regular rhythm.  Pulmonary:     Effort: Pulmonary effort is normal. No respiratory distress.  Abdominal:     General: There is no distension.  Neurological:     General: No focal deficit present.     Mental Status: He is alert and oriented to person, place, and time.  Psychiatric:        Mood and Affect: Mood normal.        Behavior: Behavior normal.      LABORATORY DATA:   I have reviewed the data as listed.  Results for orders placed or performed in visit on 08/17/24  Rad Onc [facility]ria Session Summary  Result Value Ref Range   Course ID [id number]    Course Start Date 07/18/2024    Session Number 12    Course First Treatment Date 08/02/2024  3:17 PM    Course Last Treatment Date 08/17/2024 11:20 AM    Course Elapsed Days 15    Reference Point ID HN_R_Tonsil DP    Reference Point Dosage Given to Date 24 Gy   Reference Point Session Dosage Given 2 Gy   Plan ID HN_R_Tonsil    Plan Name HN_R_Tonsil    Plan Fractions Treated to Date 12    Plan Total Fractions Prescribed 33    Plan Prescribed Dose Per Fraction 2 Gy   Plan Total Prescribed Dose 66.000000 Gy   Plan Primary Reference Point HN_R_Tonsil DP   Results for orders placed or performed in visit on 08/17/24  Magnesium  Result Value Ref Range   Magnesium 2.2 1.7 - 2.4 mg/dL  Basic Metabolic Panel - Cancer Center Only  Result Value Ref Range   Sodium 136 135 - 145 mmol/L   Potassium 4.4 3.5 - 5.1 mmol/L   Chloride 102 98 - 111 mmol/L  CO2 30 22 - 32 mmol/L    Glucose, Bld 127 (H) 70 - 99 mg/dL   BUN 22 8 - 23 mg/dL   Creatinine 9.09 9.38 - 1.24 mg/dL   Calcium  9.4 8.9 - 10.3 mg/dL   GFR, Estimated >39 >39 mL/min   Anion gap 4 (L) 5 - 15  CBC with Differential (Cancer Center Only)  Result Value Ref Range   WBC Count 8.3 4.0 - 10.5 K/uL   RBC 4.50 4.22 - 5.81 MIL/uL   Hemoglobin 14.6 13.0 - 17.0 g/dL   HCT 58.1 60.9 - 47.9 %   MCV 92.9 80.0 - 100.0 fL   MCH 32.4 26.0 - 34.0 pg   MCHC 34.9 30.0 - 36.0 g/dL   RDW 87.3 88.4 - 84.4 %   Platelet Count 147 (L) 150 - 400 K/uL   nRBC 0.0 0.0 - 0.2 %   Neutrophils Relative % 88 %   Neutro Abs 7.2 1.7 - 7.7 K/uL   Lymphocytes Relative 7 %   Lymphs Abs 0.6 (L) 0.7 - 4.0 K/uL   Monocytes Relative 5 %   Monocytes Absolute 0.5 0.1 - 1.0 K/uL   Eosinophils Relative 0 %   Eosinophils Absolute 0.0 0.0 - 0.5 K/uL   Basophils Relative 0 %   Basophils Absolute 0.0 0.0 - 0.1 K/uL   Immature Granulocytes 0 %   Abs Immature Granulocytes 0.02 0.00 - 0.07 K/uL      RADIOGRAPHIC STUDIES:  I have personally reviewed the radiological images as listed and agree with the findings in the report.  US  CORE BIOPSY (SALIVARY GLAND/PAROTID GLAND) Result Date: 07/24/2024 INDICATION: History of tonsillar cancel with hypermetabolic lymph node in the right parotid gland. EXAM: Ultrasound-guided core biopsy MEDICATIONS: None. ANESTHESIA/SEDATION: None FLUOROSCOPY TIME:  None COMPLICATIONS: None immediate. PROCEDURE: Informed written consent was obtained from the patient after a thorough discussion of the procedural risks, benefits and alternatives. All questions were addressed. Maximal Sterile Barrier Technique was utilized including caps, mask, sterile gowns, sterile gloves, sterile drape, hand hygiene and skin antiseptic. A timeout was performed prior to the initiation of the procedure. Ultrasound was used to interrogate the right parotid gland. A small hypoechoic nodule is visible immediately anterior to the tragus of  the ear. Skin was sterilely prepped draped in the standard fashion with chlorhexidine . Local anesthesia was attained by infiltration with 1% lidocaine . A small dermatotomy made. Under real-time ultrasound guidance, multiple 18 gauge core biopsies were obtained using the automated biopsy device. Biopsy specimens were placed on a Telfa pad and delivered to pathology for further analysis. Post biopsy ultrasound imaging demonstrates no evidence of complication. IMPRESSION: Successful ultrasound-guided core biopsy of right parotid nodule. Electronically Signed   By: Wilkie Lent M.D.   On: 07/24/2024 16:32   IR IMAGING GUIDED PORT INSERTION Result Date: 07/19/2024 INDICATION: he will receive chemotherapy for head and neck cancer EXAM: IMPLANTED PORT A CATH PLACEMENT WITH ULTRASOUND AND FLUOROSCOPIC GUIDANCE MEDICATIONS: None ANESTHESIA/SEDATION: Moderate (conscious) sedation was employed during this procedure. A total of Versed  2 mg and Fentanyl  100 mcg was administered intravenously. Moderate Sedation Time: 18 minutes. The patient's level of consciousness and vital signs were monitored continuously by radiology nursing throughout the procedure under my direct supervision. FLUOROSCOPY: Radiation Exposure Index and estimated peak skin dose (PSD); Reference air kerma (RAK), 0 mGy. COMPLICATIONS: None immediate. PROCEDURE: The procedure, risks, benefits, and alternatives were explained to the patient. Questions regarding the procedure were encouraged and answered. The patient understands and consents  to the procedure. The RIGHT neck and chest were prepped with chlorhexidine  in a sterile fashion, and a sterile drape was applied covering the operative field. Maximum barrier sterile technique with sterile gowns and gloves were used for the procedure. A timeout was performed prior to the initiation of the procedure. Local anesthesia was provided with 1% lidocaine  with epinephrine . After creating a small venotomy  incision, a micropuncture kit was utilized to access the internal jugular vein under direct, real-time ultrasound guidance. Ultrasound image documentation was performed. The microwire was kinked to measure appropriate catheter length. A subcutaneous port pocket was then created along the upper chest wall utilizing a combination of sharp and blunt dissection. The pocket was irrigated with sterile saline. A single lumen power injectable port was chosen for placement. The 8 Fr catheter was tunneled from the port pocket site to the venotomy incision. The port was placed in the pocket. The external catheter was trimmed to appropriate length. At the venotomy, an 8 Fr peel-away sheath was placed over a guidewire under fluoroscopic guidance. The catheter was then placed through the sheath and the sheath was removed. Final catheter positioning was confirmed and documented with a fluoroscopic spot radiograph. The port was accessed with a Huber needle, aspirated and flushed with heparinized saline. The port pocket incision was closed with interrupted 3-0 Vicryl suture then Dermabond was applied, including at the venotomy incision. Dressings were placed. The patient tolerated the procedure well without immediate post procedural complication. IMPRESSION: Successful placement of a RIGHT internal jugular approach power injectable Port-A-Cath. The tip of the catheter is positioned within the proximal RIGHT atrium. The catheter is ready for immediate use. Thom Hall, [facility] Radiology Electronically Signed   By: Thom Hall M.D.   On: 07/19/2024 17:08     CODE STATUS:  Code Status History     Date Active Date Inactive Code Status Order ID Comments User Context   07/19/2024 1643 07/20/2024 0515 Full Code [id number]  Hall Thom, Jenkins [facility]   06/20/2023 1423 06/23/2023 1934 Full Code [id number]  Waddell Rake, Jenkins ED   05/31/2023 1005 06/02/2023 0101 Full Code [id number]  Magda Debby SAILOR, Jenkins Inpatient    Questions for Most Recent Historical Code Status (Order [id number])     Question Answer   By: Consent: discussion documented in EHR            No orders of the defined types were placed in this encounter.    Future Appointments  Date Time Provider Department Center  08/18/2024  7:30 AM [facility] INFUSION [facility] None  08/18/2024 12:00 PM Gregory Railing L, RD [facility] None  08/18/2024  2:15 PM [facility] LINAC 4 [facility] None  08/21/2024 11:15 AM [facility] LINAC 3 [facility] None  08/21/2024 11:30 AM LINAC-SQUIRE [facility] None  08/22/2024 11:15 AM [facility] LINAC 3 [facility] None  08/23/2024 11:15 AM [facility] LINAC 3 [facility] None  08/24/2024 11:15 AM [facility] LINAC 3 [facility] None  08/25/2024  9:00 AM Abdul, [place], LCSW [facility] None  08/25/2024 11:15 AM [facility] LINAC 3 [facility] None  08/28/2024 11:30 AM [facility] [id number] [facility] None  08/29/2024 11:15 AM [facility] LINAC 3 [facility] None  08/30/2024 11:15 AM [facility] LINAC 3 [facility] None  08/31/2024 11:15 AM [facility] LINAC 3 [facility] None  09/01/2024 11:15 AM [facility] LINAC 3 [facility] None  09/04/2024 11:15 AM [facility] LINAC 3 [facility] None  09/05/2024 11:15 AM [facility] LINAC 3 [facility] None  09/06/2024 11:15 AM [facility] LINAC 3 [facility] None  09/07/2024  11:15 AM [facility] LINAC 3 [facility] None  09/08/2024 11:15 AM [facility] LINAC 3 [facility] None  09/11/2024 11:15 AM [facility] LINAC 3 [facility] None  09/12/2024 11:15 AM [facility] LINAC 3 [facility] None  09/13/2024 11:15 AM [facility] LINAC 3 [facility] None  09/14/2024 11:15 AM [facility] LINAC 3 [facility] None  09/15/2024 11:15 AM [facility] LINAC 4 [facility] None  10/10/2024 10:00 AM Breedlove Blue, Blaire L, PT [facility] None      This document was completed utilizing speech recognition software. Grammatical errors, random word  insertions, pronoun errors, and incomplete sentences are an occasional consequence of this system due to software limitations, ambient noise, and hardware issues. Any formal questions or concerns about the content, text or information contained within the body of this dictation should be directly addressed to the provider for clarification.

## 2024-08-18 ENCOUNTER — Ambulatory Visit
Admission: RE | Admit: 2024-08-18 | Discharge: 2024-08-18 | Disposition: A | Discharge status: Home / Self Care | Source: Ambulatory Visit | Attending: Radiation Oncology | Admitting: Radiation Oncology

## 2024-08-18 ENCOUNTER — Inpatient Hospital Stay: Admitting: Nutrition

## 2024-08-18 ENCOUNTER — Other Ambulatory Visit: Payer: Self-pay

## 2024-08-18 ENCOUNTER — Inpatient Hospital Stay

## 2024-08-18 VITALS — BP 119/56 | HR 58 | Temp 98.2°F | Resp 14

## 2024-08-18 DIAGNOSIS — Z510 Encounter for antineoplastic radiation therapy: Secondary | ICD-10-CM | POA: Diagnosis not present

## 2024-08-18 DIAGNOSIS — E538 Deficiency of other specified B group vitamins: Secondary | ICD-10-CM

## 2024-08-18 DIAGNOSIS — C099 Malignant neoplasm of tonsil, unspecified: Secondary | ICD-10-CM

## 2024-08-18 LAB — RAD ONC ARIA SESSION SUMMARY
Course Elapsed Days: 16
Plan Fractions Treated to Date: 13
Plan Prescribed Dose Per Fraction: 2 Gy
Plan Total Fractions Prescribed: 33
Plan Total Prescribed Dose: 66 Gy
Reference Point Dosage Given to Date: 26 Gy
Reference Point Session Dosage Given: 2 Gy
Session Number: 13

## 2024-08-18 MED ORDER — PALONOSETRON HCL INJECTION 0.25 MG/5ML
0.2500 mg | Freq: Once | INTRAVENOUS | Status: AC
  Administered 2024-08-18: 0.25 mg via INTRAVENOUS
  Filled 2024-08-18: qty 5

## 2024-08-18 MED ORDER — POTASSIUM CHLORIDE IN NACL 20-0.9 MEQ/L-% IV SOLN
Freq: Once | INTRAVENOUS | Status: AC
  Filled 2024-08-18: qty 1000

## 2024-08-18 MED ORDER — MAGNESIUM SULFATE 2 GM/50ML IV SOLN
2.0000 g | Freq: Once | INTRAVENOUS | Status: AC
  Administered 2024-08-18: 2 g via INTRAVENOUS
  Filled 2024-08-18: qty 50

## 2024-08-18 MED ORDER — SODIUM CHLORIDE 0.9% FLUSH
10.0000 mL | INTRAVENOUS | Status: DC | PRN
  Administered 2024-08-18: 10 mL

## 2024-08-18 MED ORDER — SODIUM CHLORIDE 0.9 % IV SOLN: INTRAVENOUS | Status: DC

## 2024-08-18 MED ORDER — CYANOCOBALAMIN 1000 MCG/ML IJ SOLN
1000.0000 ug | Freq: Once | INTRAMUSCULAR | Status: AC
  Administered 2024-08-18: 1000 ug via INTRAMUSCULAR
  Filled 2024-08-18: qty 1

## 2024-08-18 MED ORDER — SODIUM CHLORIDE 0.9 % IV SOLN
40.0000 mg/m2 | Freq: Once | INTRAVENOUS | Status: AC
  Administered 2024-08-18: 78 mg via INTRAVENOUS
  Filled 2024-08-18 (×2): qty 78

## 2024-08-18 MED ORDER — SODIUM CHLORIDE 0.9 % IV SOLN
150.0000 mg | Freq: Once | INTRAVENOUS | Status: AC
  Administered 2024-08-18: 150 mg via INTRAVENOUS
  Filled 2024-08-18: qty 150

## 2024-08-18 MED ORDER — DEXAMETHASONE SOD PHOSPHATE PF 10 MG/ML IJ SOLN
10.0000 mg | Freq: Once | INTRAMUSCULAR | Status: AC
  Administered 2024-08-18: 10 mg via INTRAVENOUS

## 2024-08-18 NOTE — Progress Notes (Signed)
 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  He is also receiving concurrent chemoradiation therapy with weekly cisplatin.  Weight: 163.1 pounds October 23 165 pounds 3.2 ounces October 15 166 pounds October 10  Labs include glucose 127  Estimated nutrition needs: 2110-2400 cal, 90-105 g protein, greater than 2.1 L fluid.  Patient is appetite remains poor however he is forcing himself to eat what he can.  Generally has eggs with toast and gravy or pancake for breakfast.  He was able to eat roast beef and corn beef without difficulty.  He is thinning boost VHC with water and tolerated well.  He is drinking 1 bottle daily since last week.  Reports he suffered from constipation.  Per MD he took MiraLAX every 3 hours and finally had a bowel movement.  Reports he did not feel well at that time and so oral intake had decreased some.  Continues to work to avoid feeding tube placement.  Nutrition diagnosis: Predicted sub-optimal energy intake, ongoing.   Intervention: Continue strategies for small amounts of food frequently throughout the day. Continue to try to choose high-calorie, high-protein foods. Increase boost VHC twice daily. (2 cartons provides 1060 cal, 44 gm protein) Continue adequate fluids. Bowel regimen  Monitoring, evaluation, goals: Patient will tolerate increased calories and protein to minimize further weight loss.  Next visit: Friday, October 31 during infusion.  **Disclaimer: This note was dictated with voice recognition software. Similar sounding words can inadvertently be transcribed and this note may contain transcription errors which may not have been corrected upon publication of note.**

## 2024-08-21 ENCOUNTER — Other Ambulatory Visit: Payer: Self-pay

## 2024-08-21 ENCOUNTER — Ambulatory Visit
Admission: RE | Admit: 2024-08-21 | Discharge: 2024-08-21 | Disposition: A | Source: Ambulatory Visit | Attending: Radiation Oncology

## 2024-08-21 DIAGNOSIS — Z510 Encounter for antineoplastic radiation therapy: Secondary | ICD-10-CM | POA: Diagnosis not present

## 2024-08-21 LAB — RAD ONC ARIA SESSION SUMMARY
Course Elapsed Days: 19
Plan Fractions Treated to Date: 14
Plan Prescribed Dose Per Fraction: 2 Gy
Plan Total Fractions Prescribed: 33
Plan Total Prescribed Dose: 66 Gy
Reference Point Dosage Given to Date: 28 Gy
Reference Point Session Dosage Given: 2 Gy
Session Number: 14

## 2024-08-22 ENCOUNTER — Ambulatory Visit
Admission: RE | Admit: 2024-08-22 | Discharge: 2024-08-22 | Disposition: A | Discharge status: Home / Self Care | Source: Ambulatory Visit | Attending: Radiation Oncology | Admitting: Radiation Oncology

## 2024-08-22 ENCOUNTER — Encounter: Payer: Self-pay | Admitting: Nurse Practitioner

## 2024-08-22 ENCOUNTER — Inpatient Hospital Stay

## 2024-08-22 ENCOUNTER — Other Ambulatory Visit: Payer: Self-pay

## 2024-08-22 ENCOUNTER — Inpatient Hospital Stay: Admitting: Nurse Practitioner

## 2024-08-22 VITALS — BP 110/60 | HR 62 | Temp 97.7°F | Resp 17 | Wt 162.8 lb

## 2024-08-22 DIAGNOSIS — C099 Malignant neoplasm of tonsil, unspecified: Secondary | ICD-10-CM | POA: Diagnosis not present

## 2024-08-22 DIAGNOSIS — Z510 Encounter for antineoplastic radiation therapy: Secondary | ICD-10-CM | POA: Diagnosis not present

## 2024-08-22 LAB — BASIC METABOLIC PANEL - CANCER CENTER ONLY
Anion gap: 6 (ref 5–15)
BUN: 20 mg/dL (ref 8–23)
CO2: 29 mmol/L (ref 22–32)
Calcium: 9.5 mg/dL (ref 8.9–10.3)
Chloride: 100 mmol/L (ref 98–111)
Creatinine: 0.85 mg/dL (ref 0.61–1.24)
GFR, Estimated: >60 mL/min (ref >60)
Glucose, Bld: 129 mg/dL — ABNORMAL HIGH (ref 70–99)
Potassium: 3.7 mmol/L (ref 3.5–5.1)
Sodium: 135 mmol/L (ref 135–145)

## 2024-08-22 LAB — CBC WITH DIFFERENTIAL (CANCER CENTER ONLY)
Abs Immature Granulocytes: 0.02 K/uL (ref 0.00–0.07)
Basophils Absolute: 0 K/uL (ref 0.0–0.1)
Basophils Relative: 0 %
Eosinophils Absolute: 0 K/uL (ref 0.0–0.5)
Eosinophils Relative: 0 %
HCT: 41.7 % (ref 39.0–52.0)
Hemoglobin: 14.2 g/dL (ref 13.0–17.0)
Immature Granulocytes: 0 %
Lymphocytes Relative: 16 %
Lymphs Abs: 0.8 K/uL (ref 0.7–4.0)
MCH: 31.4 pg (ref 26.0–34.0)
MCHC: 34.1 g/dL (ref 30.0–36.0)
MCV: 92.3 fL (ref 80.0–100.0)
Monocytes Absolute: 0.5 K/uL (ref 0.1–1.0)
Monocytes Relative: 9 %
Neutro Abs: 4 K/uL (ref 1.7–7.7)
Neutrophils Relative %: 75 %
Platelet Count: 149 K/uL — ABNORMAL LOW (ref 150–400)
RBC: 4.52 MIL/uL (ref 4.22–5.81)
RDW: 12.6 % (ref 11.5–15.5)
WBC Count: 5.3 K/uL (ref 4.0–10.5)
nRBC: 0 % (ref 0.0–0.2)

## 2024-08-22 LAB — RAD ONC ARIA SESSION SUMMARY
Course Elapsed Days: 20
Plan Fractions Treated to Date: 15
Plan Prescribed Dose Per Fraction: 2 Gy
Plan Total Fractions Prescribed: 33
Plan Total Prescribed Dose: 66 Gy
Reference Point Dosage Given to Date: 30 Gy
Reference Point Session Dosage Given: 2 Gy
Session Number: 15

## 2024-08-22 LAB — MAGNESIUM: Magnesium: 2 mg/dL (ref 1.7–2.4)

## 2024-08-22 MED ORDER — MIRTAZAPINE 7.5 MG PO TABS: 7.5000 mg | ORAL_TABLET | Freq: Every day | ORAL | 1 refills | Status: DC

## 2024-08-22 NOTE — Progress Notes (Signed)
 [facility]   Telephone:([phone] Fax:([phone]) [phone]    Patient Care Team: Marvene Prentice SAUNDERS, FNP as PCP - General (Family Medicine) Carlie Clark, MD as Consulting Physician (Otolaryngology) Izell Domino, MD as Attending Physician (Radiation Oncology) Daryle Heron CROME, RD as Dietitian (Nutrition) Malmfelt, Delon CROME, RN as Oncology Nurse Navigator [name], MD as Consulting Physician (Oncology) Lauralee Chew, MD as Referring Physician (Otolaryngology)   CHIEF COMPLAINT: Follow-up SCC of the right tonsil  Oncology History  Malignant neoplasm of tonsillar fossa (HCC)  11/12/2015 Procedure   Accession: [id number] FNA of left neck LN positive for squamous cell cancer   11/18/2015 Imaging   Left greater than right cervical lymphadenopathy suspicious for nodal metastatic disease.2. Mild asymmetry of the left tonsil -- correlate with direct visualization to assess for primary neoplasm.   11/20/2015 Procedure   He has left tonsil biopsy   11/20/2015 Pathology Results   Accession: [id number] Left tonsil biopsy showed squamous cell cancer, p16 positive.   11/28/2015 PET scan   1. Left palatine tonsil primary with left worse than right cervical nodal metastasis. 2. No extracervical hypermetabolic metastasis.    12/12/2015 Procedure   Peg and port-a-cath placement.   12/18/2015 - 01/29/2016 Chemotherapy   He received weekly cetuximab    12/18/2015 - 02/03/2016 Radiation Therapy   Received Helicle IMRT Tomotherapy:  Left tonsil and bilateral neck / 70 Gy in 35 fractions to gross disease, 63 Gy in 35 fractions to high risk nodal echelons, and 56 Gy in 35 fractions to intermediate risk nodal echelons.   12/31/2015 Adverse Reaction   Treatment #3 is placed on hold due to worsening mucositis   01/08/2016 Miscellaneous   Cetuximab  #3 resumed with 50% dose adjustment   01/14/2016 Adverse Reaction   Treatment #4 is placed on hold due to worsening mucositis and failure to  thrive   03/20/2016 Imaging   CT abdomen showed mild gallbladder wall thickening with pericholecystic fluid and several gallstones. The gallbladder is nondistended. Recommend clinical correlation for acute or chronic cholecystitis.   04/15/2016 Procedure   PEG removed.   05/26/2016 Imaging   Restaging PET:  Marked interval improvement with resolution of left tonsillar hypermetabolism and right cervical lymph node hypermetabolism. Left cervical lymph nodes have decreased substantially in size and there is a subtle degree of FDG hypermetabolism still associated with these left-sided cervical nodes.   10/12/2016 Procedure   Port-a-cath removed.   02/14/2019 Imaging   CT neck 1. Satisfactory post treatment appearance of the neck. NI-Rads category 1. 2. No mass or abnormality to correspond to the marked area of concern at the level of the right submandibular gland.     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 His wife states they are working hard but struggling. He's not sleeping well. Steroids help but foods still sour on my stomach. Not nauseous or vomiting but queasy. Started Zofran  this morning. Started tums for reflux per Dr.  Squire. Throat is not sore. Drinking shakes and some solids, ate pizza this past Friday. Alternates between constipation and diarrhea. Denies fever/chills.   ROS  All other systems reviewed and negative   Past Medical History:  Diagnosis Date    Abdominal pain 03/19/2016   Allergic rhinitis    Cancer of tonsillar fossa (HCC)    left   Clotting disorder    PE, suspect lupus anticoagulant   Drug-induced skin rash 12/25/2015   History of kidney stones 2000   History of nephrolithiasis    History of pulmonary embolism    History of radiation therapy 12/17/2015- 02/03/16   Left Tonsil and Bilateral Neck   Hypothyroidism    Mucositis oral 01/08/2016   Sleep apnea 2000   Thyroid  disease    Tonsil cancer [facility]      Past Surgical History:  Procedure Laterality Date   AMPUTATION Left 01/01/2018   Procedure: REVISION AMPUTATION RING FINGER;  Surgeon: Shari Easter, MD;  Location: [facility] OR;  Service: Orthopedics;  Laterality: Left;   CARPAL TUNNEL RELEASE Left 11/14/2021   Procedure: LEFT CARPAL TUNNEL RELEASE;  Surgeon: Murrell Drivers, MD;  Location: [facility];  Service: Orthopedics;  Laterality: Left;  30 MIN   CHOLECYSTECTOMY N/A 06/22/2023   Procedure: LAPAROSCOPIC CHOLECYSTECTOMY WITH ICG DYE;  Surgeon: Vanderbilt Ned, MD;  Location: [facility] OR;  Service: General;  Laterality: N/A;   EYE SURGERY Right 1980's   H/O right orbital blowout fracture   FOOT SURGERY Left    [facility] Neuroma   GASTROSTOMY TUBE PLACEMENT     IR GASTROSTOMY TUBE REMOVAL     IR GENERIC HISTORICAL  10/12/2016   IR REMOVAL TUN ACCESS W/ PORT W/O FL MOD SED 10/12/2016 Ami Bellman, DO [facility]   IR IMAGING GUIDED PORT INSERTION  07/19/2024   KNEE SURGERY Right    Arthroscopic   PORTA CATH INSERTION     RIB RESECTION Left 05/31/2023   Procedure: LEFT FIRST RIB EXCISION;  Surgeon: Magda Ned SAILOR, MD;  Location: [facility] OR;  Service: Vascular;  Laterality: Left;   VENOGRAM N/A 05/31/2023   Procedure: LEFT UPPER EXTREMITY AND CENTRAL VENOGRAM;  Surgeon: Magda Ned SAILOR, MD;  Location: [facility] OR;  Service: Vascular;  Laterality: N/A;     Outpatient Encounter Medications as of 08/22/2024  Medication Sig Note   acetaminophen  (TYLENOL ) 500 MG tablet Take 1,000  mg by mouth every 6 (six) hours as needed for moderate pain.    aspirin  EC 81 MG tablet Take 81 mg by mouth daily.    atorvastatin  (LIPITOR) 10 MG tablet Take 10 mg by mouth daily.    cyanocobalamin (VITAMIN B12) 1000 MCG/ML injection Inject 1,000 mcg into the muscle every 30 (thirty) days.    dexamethasone  (DECADRON ) 4 MG tablet Take 2 tablets (8 mg) by mouth daily x 3 days starting the day after cisplatin chemotherapy. Take with food. 07/19/2024: Hasn't started   ipratropium (ATROVENT) 0.06 % nasal spray Place 2 sprays into both nostrils daily as needed for rhinitis.    levothyroxine  (SYNTHROID ) 125 MCG tablet Take 125 mcg by mouth daily before breakfast.    lidocaine  (XYLOCAINE ) 2 % solution Patient: Mix 1part 2% viscous lidocaine , 1part H20. Swish & swallow 10mL of diluted mixture, 30min before meals and at bedtime, up to QID    lidocaine -prilocaine  (EMLA ) cream Apply to affected area once 07/19/2024: Hasn't started   mirtazapine (REMERON) 7.5 MG tablet Take 1 tablet (7.5 mg total) by mouth at bedtime.  ondansetron  (ZOFRAN ) 8 MG tablet Take 1 tablet (8 mg total) by mouth every 8 (eight) hours as needed for nausea or vomiting. Start on the third day after cisplatin. 07/19/2024: Hasn't started   prochlorperazine  (COMPAZINE ) 10 MG tablet Take 1 tablet (10 mg total) by mouth every 6 (six) hours as needed (Nausea or vomiting). 07/19/2024: Hasn't started   No facility-administered encounter medications on file as of 08/22/2024.     Today's Vitals   08/22/24 0953 08/22/24 1006  BP: 110/60   Pulse: 62   Resp: 17   Temp: 97.7 F (36.5 C)   SpO2: 95%   Weight: 162 lb 12.8 oz (73.8 kg)   PainSc:  0-No pain   Body mass index is 22.71 kg/m.   ECOG PERFORMANCE STATUS: 1 - Symptomatic but completely ambulatory  PHYSICAL EXAM GENERAL: flat affect, no distress and comfortable SKIN: no rash  HEENT:  sclera clear, no thrush or ulcers NECK: fibrotic skin, no mass LUNGS: clear with normal breathing  effort HEART: regular rate & rhythm, no lower extremity edema ABDOMEN: abdomen soft, non-tender and normal bowel sounds NEURO: alert & oriented x 3 with fluent speech, no focal motor/sensory deficits PAC without erythema    CBC    Latest Ref Rng & Units 08/22/2024    9:32 AM 08/17/2024   10:39 AM 08/09/2024   10:48 AM  CBC  WBC 4.0 - 10.5 K/uL 5.3  8.3  6.4   Hemoglobin 13.0 - 17.0 g/dL 85.7  85.3  86.5   Hematocrit 39.0 - 52.0 % 41.7  41.8  38.8   Platelets 150 - 400 K/uL 149  147  187       CMP     Latest Ref Rng & Units 08/22/2024    9:32 AM 08/17/2024   10:39 AM 08/09/2024   10:48 AM  CMP  Glucose 70 - 99 mg/dL 870  872  877   BUN 8 - 23 mg/dL 20  22  22    Creatinine 0.61 - 1.24 mg/dL 9.14  9.09  9.13   Sodium 135 - 145 mmol/L 135  136  135   Potassium 3.5 - 5.1 mmol/L 3.7  4.4  4.2   Chloride 98 - 111 mmol/L 100  102  102   CO2 22 - 32 mmol/L 29  30  29    Calcium  8.9 - 10.3 mg/dL 9.5  9.4  9.4       ASSESSMENT & PLAN:73 y.o. gentleman with a past medical history of left tonsillar squamous cell carcinoma, managed with concurrent chemoradiation using cetuximab  in 2017, went into remission, was referred to our clinic for recently diagnosed right tonsillar cancer. cT2,cN0,cM0,p16- , Stage II.   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  He continued to have follow-ups up until 2022 with no evidence of disease clinically and on imaging.  Previously treated by Dr. Lonn in our clinic. -Now with a recurrence on the right side, p16 negative.  No evidence of metastatic disease on PET scan. -He was seen in consultation by Dr. Lauralee (surgical ENT) on 06/28/24 to discuss surgical treatment options. Although surgery would likely provide him with the best outcome, Dr. Lauralee is concerned  that his soft palate involvement and history of RT will increase his risk of surgical complications. In this setting, Dr. Lauralee would like him to be considered for  re-irradiation.  -His case was discussed in our ENT tumor conference on 07/12/2024.  Consensus opinion is to proceed with concurrent chemoradiation with cisplatin. -On 07/17/2024, MRI of the neck showed nodular thickening and enhancement of the right palatine tonsil extending to the soft palate, base of the uvula, and lateral/posterolateral oropharyngeal mucosa, compatible with oropharyngeal malignancy. 7 mm nodule in the superficial lobe of the right parotid corresponding to prior PET uptake, suspicious for primary parotid neoplasm or metastatic lymph node. -On 07/24/2024, core biopsy of the right parotid lesion showed bland oncocytic lesion, benign finding. -Plan made to proceed with concurrent chemoradiation using weekly cisplatin.  Started radiation treatments from 08/02/2024.  Received cycle 1 of cisplatin on 08/04/2024.  -Mr. Kemmerling appears stable, s/p week 3 CCRT with cisplatin, tolerating treatment moderately well with reflux, constipation/diarrhea, and low appetite. SEs are partially managed with supportive care at home.  I recommend to add antiemetic 30 minutes prior to offering food and try Compazine  at night to help with sleep.  Continue dietitian follow-up.  He is able to maintain adequate performance status -I discussed mirtazapine to help with mood, appetite, and sleep, reviewed potential side effects and benefits, he agrees to try -Labs reviewed adequate to proceed with week 4 cisplatin 10/31 as scheduled Will return for fluids, social work, and nutrition follow-up tomorrow -Continue daily RT -Follow-up and fluids next week, with week 5 cisplatin on 11/5     PLAN: -Labs reviewed -Proceed with IVF, nutrition, SW on 10/29 and Week 4 cisplatin 10/31, no dose adjustments -Reviewed symptom management -Begin Mirtazapine 7.5 mg po  at bedtime -F/up next week with fluids, then week 5 cisplatin 11/7 pending labs -Continue daily RT   All questions were answered. The patient knows to call the clinic with any problems, questions or concerns. No barriers to learning were detected. I spent 20 minutes counseling the patient face to face. The total time spent in the appointment was 30 minutes and more than 50% was on counseling, review of test results, and coordination of care.   Jaydeen Darley K Felma Pfefferle, NP 08/22/2024

## 2024-08-23 ENCOUNTER — Inpatient Hospital Stay

## 2024-08-23 ENCOUNTER — Ambulatory Visit
Admission: RE | Admit: 2024-08-23 | Discharge: 2024-08-23 | Disposition: A | Discharge status: Home / Self Care | Source: Ambulatory Visit | Attending: Radiation Oncology | Admitting: Radiation Oncology

## 2024-08-23 ENCOUNTER — Other Ambulatory Visit: Payer: Self-pay

## 2024-08-23 VITALS — BP 117/70 | HR 64 | Temp 97.8°F | Resp 18 | Wt 161.5 lb

## 2024-08-23 DIAGNOSIS — E538 Deficiency of other specified B group vitamins: Secondary | ICD-10-CM

## 2024-08-23 DIAGNOSIS — Z510 Encounter for antineoplastic radiation therapy: Secondary | ICD-10-CM | POA: Diagnosis not present

## 2024-08-23 LAB — RAD ONC ARIA SESSION SUMMARY
Course Elapsed Days: 21
Plan Fractions Treated to Date: 16
Plan Prescribed Dose Per Fraction: 2 Gy
Plan Total Fractions Prescribed: 33
Plan Total Prescribed Dose: 66 Gy
Reference Point Dosage Given to Date: 32 Gy
Reference Point Session Dosage Given: 2 Gy
Session Number: 16

## 2024-08-23 MED ORDER — SODIUM CHLORIDE 0.9 % IV SOLN: Freq: Once | INTRAVENOUS | Status: AC

## 2024-08-23 NOTE — Patient Instructions (Signed)

## 2024-08-24 ENCOUNTER — Other Ambulatory Visit: Payer: Self-pay

## 2024-08-24 ENCOUNTER — Ambulatory Visit
Admission: RE | Admit: 2024-08-24 | Discharge: 2024-08-24 | Disposition: A | Source: Ambulatory Visit | Attending: Radiation Oncology

## 2024-08-24 DIAGNOSIS — Z510 Encounter for antineoplastic radiation therapy: Secondary | ICD-10-CM | POA: Diagnosis not present

## 2024-08-24 LAB — RAD ONC ARIA SESSION SUMMARY
Course Elapsed Days: 22
Plan Fractions Treated to Date: 17
Plan Prescribed Dose Per Fraction: 2 Gy
Plan Total Fractions Prescribed: 33
Plan Total Prescribed Dose: 66 Gy
Reference Point Dosage Given to Date: 34 Gy
Reference Point Session Dosage Given: 2 Gy
Session Number: 17

## 2024-08-24 MED FILL — Fosaprepitant Dimeglumine For IV Infusion 150 MG (Base Eq): INTRAVENOUS | Qty: 5 | Status: AC

## 2024-08-25 ENCOUNTER — Inpatient Hospital Stay: Admitting: Nutrition

## 2024-08-25 ENCOUNTER — Ambulatory Visit
Admission: RE | Admit: 2024-08-25 | Discharge: 2024-08-25 | Disposition: A | Discharge status: Home / Self Care | Source: Ambulatory Visit | Attending: Radiation Oncology | Admitting: Radiation Oncology

## 2024-08-25 ENCOUNTER — Other Ambulatory Visit: Payer: Self-pay

## 2024-08-25 ENCOUNTER — Inpatient Hospital Stay

## 2024-08-25 ENCOUNTER — Encounter: Payer: Self-pay | Admitting: Oncology

## 2024-08-25 VITALS — BP 119/72 | HR 70 | Temp 97.7°F | Resp 16

## 2024-08-25 DIAGNOSIS — C099 Malignant neoplasm of tonsil, unspecified: Secondary | ICD-10-CM

## 2024-08-25 DIAGNOSIS — Z510 Encounter for antineoplastic radiation therapy: Secondary | ICD-10-CM | POA: Diagnosis not present

## 2024-08-25 LAB — RAD ONC ARIA SESSION SUMMARY
Course Elapsed Days: 23
Plan Fractions Treated to Date: 18
Plan Prescribed Dose Per Fraction: 2 Gy
Plan Total Fractions Prescribed: 33
Plan Total Prescribed Dose: 66 Gy
Reference Point Dosage Given to Date: 36 Gy
Reference Point Session Dosage Given: 2 Gy
Session Number: 18

## 2024-08-25 MED ORDER — DEXAMETHASONE SOD PHOSPHATE PF 10 MG/ML IJ SOLN
10.0000 mg | Freq: Once | INTRAMUSCULAR | Status: AC
  Administered 2024-08-25: 10 mg via INTRAVENOUS

## 2024-08-25 MED ORDER — SODIUM CHLORIDE 0.9 % IV SOLN
40.0000 mg/m2 | Freq: Once | INTRAVENOUS | Status: AC
  Administered 2024-08-25: 78 mg via INTRAVENOUS
  Filled 2024-08-25: qty 78

## 2024-08-25 MED ORDER — SODIUM CHLORIDE 0.9 % IV SOLN
150.0000 mg | Freq: Once | INTRAVENOUS | Status: AC
  Administered 2024-08-25: 150 mg via INTRAVENOUS
  Filled 2024-08-25: qty 150

## 2024-08-25 MED ORDER — MAGNESIUM SULFATE 2 GM/50ML IV SOLN
2.0000 g | Freq: Once | INTRAVENOUS | Status: AC
  Administered 2024-08-25: 2 g via INTRAVENOUS
  Filled 2024-08-25: qty 50

## 2024-08-25 MED ORDER — PALONOSETRON HCL INJECTION 0.25 MG/5ML
0.2500 mg | Freq: Once | INTRAVENOUS | Status: AC
  Administered 2024-08-25: 0.25 mg via INTRAVENOUS
  Filled 2024-08-25: qty 5

## 2024-08-25 MED ORDER — SODIUM CHLORIDE 0.9 % IV SOLN: INTRAVENOUS | Status: DC

## 2024-08-25 MED ORDER — POTASSIUM CHLORIDE IN NACL 20-0.9 MEQ/L-% IV SOLN
Freq: Once | INTRAVENOUS | Status: AC
  Filled 2024-08-25: qty 1000

## 2024-08-25 NOTE — Progress Notes (Signed)
 [facility] CSW Progress Note  Visual Merchandiser briefly met with patient for treatment check-in #1. Patient reported three weeks left of treatment. Patient denied any significant amounts pain at this point of treatment. Patient expressed still eating orally and no significant weight loss. No immediate needs at this point of treatment.      Follow Up Plan:  No follow up scheduled. Patient is aware of how to contact CSW if needed.    Lizbeth Sprague, LCSW Clinical Social Worker [facility]

## 2024-08-25 NOTE — Progress Notes (Signed)
 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  Patient received week 4 of cisplatin today and is getting radiation therapy concurrently.  Final radiation therapy is scheduled for November 21.  He is followed by Dr. Izell and Dr. Autumn.  Weight documented as 162 pounds 12.8 ounces October 28, stable from 163.1 pounds October 23.  Labs include glucose 129.  Estimated nutrition needs: 2110-2400 cal, 90-105 g protein, greater than 2.1 L fluid.  Patient is forcing himself to eat.  He does not have a great appetite.  He is getting tired of eating a lot of eggs.  He had a bacon egg and cheese biscuit today.  He managed to eat all the eggs and bacon and half the biscuit.  He is drinking boost VHC.  He is managing constipation with MiraLAX.  He thinks ONS soured on his stomach.  MD suggested patient try Tums.  He is working hard to avoid feeding tube placement.  Patient will begin mirtazapine to help with mood, appetite and sleep.  Nutrition diagnosis: Predicted sub-optimal energy intake, ongoing.  Intervention: Congratulated patient on his hard work consuming adequate calories for weight maintenance this past week. Continue high-calorie, high-protein foods as tolerated along with oral nutrition supplements. Continue adequate fluids. Continue bowel regimen.  Monitoring, evaluation, goals: Patient will tolerate adequate calories and protein for weight maintenance.  Next visit: Wednesday, November 5, during infusion with Barb.  **Disclaimer: This note was dictated with voice recognition software. Similar sounding words can inadvertently be transcribed and this note may contain transcription errors which may not have been corrected upon publication of note.**

## 2024-08-25 NOTE — Patient Instructions (Signed)
 [facility] - A DEPT OF [facility]  Discharge Instructions: Thank you for choosing [facility] to provide your oncology and hematology care.   If you have a lab appointment with the Cancer Center, please go directly to the Cancer Center and check in at the registration area.   Wear comfortable clothing and clothing appropriate for easy access to any Portacath or PICC line.   We strive to give you quality time with your provider. You may need to reschedule your appointment if you arrive late (15 or more minutes).  Arriving late affects you and other patients whose appointments are after yours.  Also, if you miss three or more appointments without notifying the office, you may be dismissed from the clinic at the provider's discretion.      For prescription refill requests, have your pharmacy contact our office and allow 72 hours for refills to be completed.    Today you received the following chemotherapy and/or immunotherapy agents: Cisplatin      To help prevent nausea and vomiting after your treatment, we encourage you to take your nausea medication as directed.  BELOW ARE SYMPTOMS THAT SHOULD BE REPORTED IMMEDIATELY: *FEVER GREATER THAN 100.4 F (38 C) OR HIGHER *CHILLS OR SWEATING *NAUSEA AND VOMITING THAT IS NOT CONTROLLED WITH YOUR NAUSEA MEDICATION *UNUSUAL SHORTNESS OF BREATH *UNUSUAL BRUISING OR BLEEDING *URINARY PROBLEMS (pain or burning when urinating, or frequent urination) *BOWEL PROBLEMS (unusual diarrhea, constipation, pain near the anus) TENDERNESS IN MOUTH AND THROAT WITH OR WITHOUT PRESENCE OF ULCERS (sore throat, sores in mouth, or a toothache) UNUSUAL RASH, SWELLING OR PAIN  UNUSUAL VAGINAL DISCHARGE OR ITCHING   Items with * indicate a potential emergency and should be followed up as soon as possible or go to the Emergency Department if any problems should occur.  Please show the CHEMOTHERAPY ALERT CARD or IMMUNOTHERAPY  ALERT CARD at check-in to the Emergency Department and triage nurse.  Should you have questions after your visit or need to cancel or reschedule your appointment, please contact [facility] - A DEPT OF [facility]  Dept: [phone]  and follow the prompts.  Office hours are 8:00 a.m. to 4:30 p.m. Monday - Friday. Please note that voicemails left after 4:00 p.m. may not be returned until the following business day.  We are closed weekends and major holidays. You have access to a nurse at all times for urgent questions. Please call the main number to the clinic Dept: [phone] and follow the prompts.   For any non-urgent questions, you may also contact your provider using MyChart. We now offer e-Visits for anyone 65 and older to request care online for non-urgent symptoms. For details visit [email or web address].   Also download the MyChart app! Go to the app store, search "MyChart", open the app, select [facility], and log in with your MyChart username and password.  Cisplatin Injection What is this medication? CISPLATIN (SIS pla tin) treats some types of cancer. It works by slowing down the growth of cancer cells. This medicine may be used for other purposes; ask your health care provider or pharmacist if you have questions. COMMON BRAND NAME(S): Platinol, Platinol -AQ What should I tell my care team before I take this medication? They need to know if you have any of these conditions: Eye disease, vision problems Hearing problems Kidney disease Low blood counts, such as low white cells, platelets, or red  blood cells Tingling of the fingers or toes, or other nerve disorder An unusual or allergic reaction to cisplatin, carboplatin, oxaliplatin, other medications, foods, dyes, or preservatives If you or your partner are pregnant or trying to get pregnant Breast-feeding How should I use this medication? This medication is injected into a vein. It is given  by your care team in a hospital or clinic setting. Talk to your care team about the use of this medication in children. Special care may be needed. Overdosage: If you think you have taken too much of this medicine contact a poison control center or emergency room at once. NOTE: This medicine is only for you. Do not share this medicine with others. What if I miss a dose? Keep appointments for follow-up doses. It is important not to miss your dose. Call your care team if you are unable to keep an appointment. What may interact with this medication? Do not take this medication with any of the following: Live virus vaccines This medication may also interact with the following: Certain antibiotics, such as amikacin, gentamicin, neomycin, polymyxin B, streptomycin, tobramycin, vancomycin Foscarnet This list may not describe all possible interactions. Give your health care provider a list of all the medicines, herbs, non-prescription drugs, or dietary supplements you use. Also tell them if you smoke, drink alcohol, or use illegal drugs. Some items may interact with your medicine. What should I watch for while using this medication? Your condition will be monitored carefully while you are receiving this medication. You may need blood work done while taking this medication. This medication may make you feel generally unwell. This is not uncommon, as chemotherapy can affect healthy cells as well as cancer cells. Report any side effects. Continue your course of treatment even though you feel ill unless your care team tells you to stop. This medication may increase your risk of getting an infection. Call your care team for advice if you get a fever, chills, sore throat, or other symptoms of a cold or flu. Do not treat yourself. Try to avoid being around people who are sick. Avoid taking medications that contain aspirin, acetaminophen, ibuprofen, naproxen, or ketoprofen unless instructed by your care team. These  medications may hide a fever. This medication may increase your risk to bruise or bleed. Call your care team if you notice any unusual bleeding. Be careful brushing or flossing your teeth or using a toothpick because you may get an infection or bleed more easily. If you have any dental work done, tell your dentist you are receiving this medication. Drink fluids as directed while you are taking this medication. This will help protect your kidneys. Call your care team if you get diarrhea. Do not treat yourself. Talk to your care team if you or your partner wish to become pregnant or think you might be pregnant. This medication can cause serious birth defects if taken during pregnancy and for 14 months after the last dose. A negative pregnancy test is required before starting this medication. A reliable form of contraception is recommended while taking this medication and for 14 months after the last dose. Talk to your care team about effective forms of contraception. Do not father a child while taking this medication and for 11 months after the last dose. Use a condom during sex during this time period. Do not breast-feed while taking this medication. This medication may cause infertility. Talk to your care team if you are concerned about your fertility. What side effects may I  notice from receiving this medication? Side effects that you should report to your care team as soon as possible: Allergic reactions--skin rash, itching, hives, swelling of the face, lips, tongue, or throat Eye pain, change in vision, vision loss Hearing loss, ringing in ears Infection--fever, chills, cough, sore throat, wounds that don't heal, pain or trouble when passing urine, general feeling of discomfort or being unwell Kidney injury--decrease in the amount of urine, swelling of the ankles, hands, or feet Low red blood cell level--unusual weakness or fatigue, dizziness, headache, trouble breathing Painful swelling, warmth,  or redness of the skin, blisters or sores at the infusion site Pain, tingling, or numbness in the hands or feet Unusual bruising or bleeding Side effects that usually do not require medical attention (report to your care team if they continue or are bothersome): Hair loss Nausea Vomiting This list may not describe all possible side effects. Call your doctor for medical advice about side effects. You may report side effects to FDA at 1-800-FDA-1088. Where should I keep my medication? This medication is given in a hospital or clinic. It will not be stored at home. NOTE: This sheet is a summary. It may not cover all possible information. If you have questions about this medicine, talk to your doctor, pharmacist, or health care provider.  2024 Elsevier/Gold Standard (2022-02-13 00:00:00)

## 2024-08-28 ENCOUNTER — Ambulatory Visit
Admission: RE | Admit: 2024-08-28 | Discharge: 2024-08-28 | Disposition: A | Discharge status: Home / Self Care | Source: Ambulatory Visit | Attending: Radiation Oncology | Admitting: Radiation Oncology

## 2024-08-28 ENCOUNTER — Other Ambulatory Visit: Payer: Self-pay

## 2024-08-28 DIAGNOSIS — D701 Agranulocytosis secondary to cancer chemotherapy: Secondary | ICD-10-CM | POA: Insufficient documentation

## 2024-08-28 DIAGNOSIS — E538 Deficiency of other specified B group vitamins: Secondary | ICD-10-CM | POA: Diagnosis not present

## 2024-08-28 DIAGNOSIS — Z87891 Personal history of nicotine dependence: Secondary | ICD-10-CM | POA: Insufficient documentation

## 2024-08-28 DIAGNOSIS — C099 Malignant neoplasm of tonsil, unspecified: Secondary | ICD-10-CM | POA: Insufficient documentation

## 2024-08-28 DIAGNOSIS — Z510 Encounter for antineoplastic radiation therapy: Secondary | ICD-10-CM | POA: Insufficient documentation

## 2024-08-28 DIAGNOSIS — Z5111 Encounter for antineoplastic chemotherapy: Secondary | ICD-10-CM | POA: Insufficient documentation

## 2024-08-28 DIAGNOSIS — Z79899 Other long term (current) drug therapy: Secondary | ICD-10-CM | POA: Insufficient documentation

## 2024-08-28 DIAGNOSIS — E860 Dehydration: Secondary | ICD-10-CM | POA: Insufficient documentation

## 2024-08-28 DIAGNOSIS — T451X5A Adverse effect of antineoplastic and immunosuppressive drugs, initial encounter: Secondary | ICD-10-CM | POA: Diagnosis not present

## 2024-08-28 DIAGNOSIS — C090 Malignant neoplasm of tonsillar fossa: Secondary | ICD-10-CM | POA: Diagnosis not present

## 2024-08-28 DIAGNOSIS — C770 Secondary and unspecified malignant neoplasm of lymph nodes of head, face and neck: Secondary | ICD-10-CM | POA: Insufficient documentation

## 2024-08-28 LAB — RAD ONC ARIA SESSION SUMMARY
Course Elapsed Days: 26
Plan Fractions Treated to Date: 19
Plan Prescribed Dose Per Fraction: 2 Gy
Plan Total Fractions Prescribed: 33
Plan Total Prescribed Dose: 66 Gy
Reference Point Dosage Given to Date: 38 Gy
Reference Point Session Dosage Given: 2 Gy
Session Number: 19

## 2024-08-29 ENCOUNTER — Other Ambulatory Visit: Payer: Self-pay

## 2024-08-29 ENCOUNTER — Ambulatory Visit
Admission: RE | Admit: 2024-08-29 | Discharge: 2024-08-29 | Disposition: A | Discharge status: Home / Self Care | Source: Ambulatory Visit | Attending: Radiation Oncology | Admitting: Radiation Oncology

## 2024-08-29 ENCOUNTER — Inpatient Hospital Stay: Attending: Oncology

## 2024-08-29 ENCOUNTER — Encounter: Payer: Self-pay | Admitting: Oncology

## 2024-08-29 DIAGNOSIS — D701 Agranulocytosis secondary to cancer chemotherapy: Secondary | ICD-10-CM | POA: Insufficient documentation

## 2024-08-29 DIAGNOSIS — T451X5A Adverse effect of antineoplastic and immunosuppressive drugs, initial encounter: Secondary | ICD-10-CM | POA: Insufficient documentation

## 2024-08-29 DIAGNOSIS — E860 Dehydration: Secondary | ICD-10-CM | POA: Insufficient documentation

## 2024-08-29 DIAGNOSIS — Z87891 Personal history of nicotine dependence: Secondary | ICD-10-CM | POA: Insufficient documentation

## 2024-08-29 DIAGNOSIS — Z79899 Other long term (current) drug therapy: Secondary | ICD-10-CM | POA: Insufficient documentation

## 2024-08-29 DIAGNOSIS — C099 Malignant neoplasm of tonsil, unspecified: Secondary | ICD-10-CM | POA: Insufficient documentation

## 2024-08-29 DIAGNOSIS — Z510 Encounter for antineoplastic radiation therapy: Secondary | ICD-10-CM | POA: Diagnosis not present

## 2024-08-29 DIAGNOSIS — E538 Deficiency of other specified B group vitamins: Secondary | ICD-10-CM | POA: Insufficient documentation

## 2024-08-29 DIAGNOSIS — Z5111 Encounter for antineoplastic chemotherapy: Secondary | ICD-10-CM | POA: Insufficient documentation

## 2024-08-29 LAB — RAD ONC ARIA SESSION SUMMARY
Course Elapsed Days: 27
Plan Fractions Treated to Date: 20
Plan Prescribed Dose Per Fraction: 2 Gy
Plan Total Fractions Prescribed: 33
Plan Total Prescribed Dose: 66 Gy
Reference Point Dosage Given to Date: 40 Gy
Reference Point Session Dosage Given: 2 Gy
Session Number: 20

## 2024-08-29 LAB — CBC WITH DIFFERENTIAL (CANCER CENTER ONLY)
Abs Immature Granulocytes: 0.01 K/uL (ref 0.00–0.07)
Basophils Absolute: 0 K/uL (ref 0.0–0.1)
Basophils Relative: 0 %
Eosinophils Absolute: 0 K/uL (ref 0.0–0.5)
Eosinophils Relative: 0 %
HCT: 37.1 % — ABNORMAL LOW (ref 39.0–52.0)
Hemoglobin: 13 g/dL (ref 13.0–17.0)
Immature Granulocytes: 0 %
Lymphocytes Relative: 16 %
Lymphs Abs: 0.8 K/uL (ref 0.7–4.0)
MCH: 32.3 pg (ref 26.0–34.0)
MCHC: 35 g/dL (ref 30.0–36.0)
MCV: 92.3 fL (ref 80.0–100.0)
Monocytes Absolute: 0.3 K/uL (ref 0.1–1.0)
Monocytes Relative: 7 %
Neutro Abs: 3.6 K/uL (ref 1.7–7.7)
Neutrophils Relative %: 77 %
Platelet Count: 112 K/uL — ABNORMAL LOW (ref 150–400)
RBC: 4.02 MIL/uL — ABNORMAL LOW (ref 4.22–5.81)
RDW: 12.7 % (ref 11.5–15.5)
WBC Count: 4.7 K/uL (ref 4.0–10.5)
nRBC: 0 % (ref 0.0–0.2)

## 2024-08-29 LAB — BASIC METABOLIC PANEL - CANCER CENTER ONLY
Anion gap: 5 (ref 5–15)
BUN: 23 mg/dL (ref 8–23)
CO2: 30 mmol/L (ref 22–32)
Calcium: 9 mg/dL (ref 8.9–10.3)
Chloride: 100 mmol/L (ref 98–111)
Creatinine: 0.83 mg/dL (ref 0.61–1.24)
GFR, Estimated: >60 mL/min (ref >60)
Glucose, Bld: 110 mg/dL — ABNORMAL HIGH (ref 70–99)
Potassium: 4 mmol/L (ref 3.5–5.1)
Sodium: 135 mmol/L (ref 135–145)

## 2024-08-29 LAB — MAGNESIUM: Magnesium: 1.9 mg/dL (ref 1.7–2.4)

## 2024-08-29 MED FILL — Fosaprepitant Dimeglumine For IV Infusion 150 MG (Base Eq): INTRAVENOUS | Qty: 5 | Status: AC

## 2024-08-30 ENCOUNTER — Other Ambulatory Visit: Payer: Self-pay

## 2024-08-30 ENCOUNTER — Inpatient Hospital Stay: Admitting: Nutrition

## 2024-08-30 ENCOUNTER — Inpatient Hospital Stay

## 2024-08-30 ENCOUNTER — Ambulatory Visit
Admission: RE | Admit: 2024-08-30 | Discharge: 2024-08-30 | Disposition: A | Discharge status: Home / Self Care | Source: Ambulatory Visit | Attending: Radiation Oncology | Admitting: Radiation Oncology

## 2024-08-30 ENCOUNTER — Inpatient Hospital Stay: Admitting: Oncology

## 2024-08-30 ENCOUNTER — Encounter: Payer: Self-pay | Admitting: Oncology

## 2024-08-30 VITALS — HR 61 | Temp 97.8°F | Resp 18 | Wt 161.5 lb

## 2024-08-30 VITALS — BP 114/62 | HR 69 | Temp 98.1°F | Resp 15 | Ht 71.0 in | Wt 161.5 lb

## 2024-08-30 DIAGNOSIS — Z510 Encounter for antineoplastic radiation therapy: Secondary | ICD-10-CM | POA: Diagnosis not present

## 2024-08-30 DIAGNOSIS — C099 Malignant neoplasm of tonsil, unspecified: Secondary | ICD-10-CM

## 2024-08-30 LAB — RAD ONC ARIA SESSION SUMMARY
Course Elapsed Days: 28
Plan Fractions Treated to Date: 21
Plan Prescribed Dose Per Fraction: 2 Gy
Plan Total Fractions Prescribed: 33
Plan Total Prescribed Dose: 66 Gy
Reference Point Dosage Given to Date: 42 Gy
Reference Point Session Dosage Given: 2 Gy
Session Number: 21

## 2024-08-30 MED ORDER — DEXAMETHASONE SOD PHOSPHATE PF 10 MG/ML IJ SOLN
10.0000 mg | Freq: Once | INTRAMUSCULAR | Status: AC
  Administered 2024-08-30: 10 mg via INTRAVENOUS

## 2024-08-30 MED ORDER — PALONOSETRON HCL INJECTION 0.25 MG/5ML
0.2500 mg | Freq: Once | INTRAVENOUS | Status: AC
  Administered 2024-08-30: 0.25 mg via INTRAVENOUS
  Filled 2024-08-30: qty 5

## 2024-08-30 MED ORDER — SODIUM CHLORIDE 0.9 % IV SOLN
40.0000 mg/m2 | Freq: Once | INTRAVENOUS | Status: AC
  Administered 2024-08-30: 78 mg via INTRAVENOUS
  Filled 2024-08-30: qty 78

## 2024-08-30 MED ORDER — POTASSIUM CHLORIDE IN NACL 20-0.9 MEQ/L-% IV SOLN
Freq: Once | INTRAVENOUS | Status: AC
  Filled 2024-08-30: qty 1000

## 2024-08-30 MED ORDER — MAGNESIUM SULFATE 2 GM/50ML IV SOLN
2.0000 g | Freq: Once | INTRAVENOUS | Status: AC
  Administered 2024-08-30: 2 g via INTRAVENOUS
  Filled 2024-08-30: qty 50

## 2024-08-30 MED ORDER — SODIUM CHLORIDE 0.9 % IV SOLN
150.0000 mg | Freq: Once | INTRAVENOUS | Status: AC
  Administered 2024-08-30: 150 mg via INTRAVENOUS
  Filled 2024-08-30: qty 150

## 2024-08-30 MED ORDER — SODIUM CHLORIDE 0.9 % IV SOLN: INTRAVENOUS | Status: DC

## 2024-08-30 NOTE — Progress Notes (Signed)
 Brief nutrition follow-up completed with patient during infusion.  Patient is receiving concurrent chemoradiation therapy for stage II SCC of the right tonsil.  Final radiation treatment is scheduled for November 21.  Weight stable and documented as 161 pounds 8 ounces.  Labs include glucose 110.  Patient denies changes from last week.  He continues to force himself to eat is much as he can.  Continues to drink boost very high-calorie and manages constipation the best he can.  Denies needs at this time.  Nutrition diagnosis: Predicted sub-optimal energy intake, ongoing.  Intervention:  Provided support to continue strategies for adequate calories and protein intake. Continue boost VHC as tolerated.  Monitoring, evaluation, goals: Continue to tolerate adequate calories and protein for weight maintenance.  Next visit: To be scheduled with upcoming treatment as needed.  Patient has RD contact information.  **Disclaimer: This note was dictated with voice recognition software. Similar sounding words can inadvertently be transcribed and this note may contain transcription errors which may not have been corrected upon publication of note.**

## 2024-08-30 NOTE — Assessment & Plan Note (Signed)
 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  He continued to have follow-ups up until 2022 with no evidence of disease clinically and on imaging.  Previously treated by Dr. Lonn in our clinic.  Now with a recurrence on the right side, p16 negative.  No evidence of metastatic disease on PET scan.  Please review oncology history for additional details and timeline of events.  He was seen in consultation by Dr. Lauralee (surgical ENT) on 06/28/24 to discuss surgical treatment options. Although surgery would likely provide him with the best outcome, Dr. 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 In this setting, Dr. Lauralee would like him to be considered for re-irradiation.   His case was discussed in our ENT tumor conference on 07/12/2024.  Consensus opinion is to proceed with concurrent chemoradiation with cisplatin.  On 07/17/2024, MRI of the neck showed nodular thickening and enhancement of the right palatine tonsil extending to the soft palate, base of the uvula, and lateral/posterolateral oropharyngeal mucosa, compatible with oropharyngeal malignancy. 7 mm nodule in the superficial lobe of the right parotid corresponding to prior PET uptake, suspicious for primary parotid neoplasm or metastatic lymph node.  On 07/24/2024, core biopsy of the right parotid lesion showed bland oncocytic lesion, benign finding.  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   Plan made to proceed with concurrent chemoradiation using weekly cisplatin.  Started radiation treatments from 08/02/2024.  Received cycle 1 of cisplatin on 08/04/2024.   He has been tolerating treatments reasonably well without major side effects.  Labs today reveal no dose-limiting toxicities.  He is scheduled for cycle # 5 of cisplatin today. Will proceed with  chemotherapy as scheduled.  We will arrange for 1 L of NS bolus at least once a week on nontreatment days.  This week he will receive IV fluids on Friday, 11/7 and on 11/11.  - Monitor kidney function and blood counts weekly - Ensure hydration with 70-80 ounces of fluid daily - Prescribed antiemetics for nausea management  He is maintaining nutrition without need for feeding tube.  I will see him in 1 week for follow-up with repeat labs and continuation of chemotherapy.

## 2024-08-30 NOTE — Patient Instructions (Signed)
 [facility] - A DEPT OF [facility]  Discharge Instructions: Thank you for choosing [facility] to provide your oncology and hematology care.   If you have a lab appointment with the Cancer Center, please go directly to the Cancer Center and check in at the registration area.   Wear comfortable clothing and clothing appropriate for easy access to any Portacath or PICC line.   We strive to give you quality time with your provider. You may need to reschedule your appointment if you arrive late (15 or more minutes).  Arriving late affects you and other patients whose appointments are after yours.  Also, if you miss three or more appointments without notifying the office, you may be dismissed from the clinic at the provider's discretion.      For prescription refill requests, have your pharmacy contact our office and allow 72 hours for refills to be completed.    Today you received the following chemotherapy and/or immunotherapy agents: Cisplatin      To help prevent nausea and vomiting after your treatment, we encourage you to take your nausea medication as directed.  BELOW ARE SYMPTOMS THAT SHOULD BE REPORTED IMMEDIATELY: *FEVER GREATER THAN 100.4 F (38 C) OR HIGHER *CHILLS OR SWEATING *NAUSEA AND VOMITING THAT IS NOT CONTROLLED WITH YOUR NAUSEA MEDICATION *UNUSUAL SHORTNESS OF BREATH *UNUSUAL BRUISING OR BLEEDING *URINARY PROBLEMS (pain or burning when urinating, or frequent urination) *BOWEL PROBLEMS (unusual diarrhea, constipation, pain near the anus) TENDERNESS IN MOUTH AND THROAT WITH OR WITHOUT PRESENCE OF ULCERS (sore throat, sores in mouth, or a toothache) UNUSUAL RASH, SWELLING OR PAIN  UNUSUAL VAGINAL DISCHARGE OR ITCHING   Items with * indicate a potential emergency and should be followed up as soon as possible or go to the Emergency Department if any problems should occur.  Please show the CHEMOTHERAPY ALERT CARD or IMMUNOTHERAPY  ALERT CARD at check-in to the Emergency Department and triage nurse.  Should you have questions after your visit or need to cancel or reschedule your appointment, please contact [facility] - A DEPT OF [facility]  Dept: [phone]  and follow the prompts.  Office hours are 8:00 a.m. to 4:30 p.m. Monday - Friday. Please note that voicemails left after 4:00 p.m. may not be returned until the following business day.  We are closed weekends and major holidays. You have access to a nurse at all times for urgent questions. Please call the main number to the clinic Dept: [phone] and follow the prompts.   For any non-urgent questions, you may also contact your provider using MyChart. We now offer e-Visits for anyone 65 and older to request care online for non-urgent symptoms. For details visit [email or web address].   Also download the MyChart app! Go to the app store, search "MyChart", open the app, select [facility], and log in with your MyChart username and password.  Cisplatin Injection What is this medication? CISPLATIN (SIS pla tin) treats some types of cancer. It works by slowing down the growth of cancer cells. This medicine may be used for other purposes; ask your health care provider or pharmacist if you have questions. COMMON BRAND NAME(S): Platinol, Platinol -AQ What should I tell my care team before I take this medication? They need to know if you have any of these conditions: Eye disease, vision problems Hearing problems Kidney disease Low blood counts, such as low white cells, platelets, or red  blood cells Tingling of the fingers or toes, or other nerve disorder An unusual or allergic reaction to cisplatin, carboplatin, oxaliplatin, other medications, foods, dyes, or preservatives If you or your partner are pregnant or trying to get pregnant Breast-feeding How should I use this medication? This medication is injected into a vein. It is given  by your care team in a hospital or clinic setting. Talk to your care team about the use of this medication in children. Special care may be needed. Overdosage: If you think you have taken too much of this medicine contact a poison control center or emergency room at once. NOTE: This medicine is only for you. Do not share this medicine with others. What if I miss a dose? Keep appointments for follow-up doses. It is important not to miss your dose. Call your care team if you are unable to keep an appointment. What may interact with this medication? Do not take this medication with any of the following: Live virus vaccines This medication may also interact with the following: Certain antibiotics, such as amikacin, gentamicin, neomycin, polymyxin B, streptomycin, tobramycin, vancomycin Foscarnet This list may not describe all possible interactions. Give your health care provider a list of all the medicines, herbs, non-prescription drugs, or dietary supplements you use. Also tell them if you smoke, drink alcohol, or use illegal drugs. Some items may interact with your medicine. What should I watch for while using this medication? Your condition will be monitored carefully while you are receiving this medication. You may need blood work done while taking this medication. This medication may make you feel generally unwell. This is not uncommon, as chemotherapy can affect healthy cells as well as cancer cells. Report any side effects. Continue your course of treatment even though you feel ill unless your care team tells you to stop. This medication may increase your risk of getting an infection. Call your care team for advice if you get a fever, chills, sore throat, or other symptoms of a cold or flu. Do not treat yourself. Try to avoid being around people who are sick. Avoid taking medications that contain aspirin, acetaminophen, ibuprofen, naproxen, or ketoprofen unless instructed by your care team. These  medications may hide a fever. This medication may increase your risk to bruise or bleed. Call your care team if you notice any unusual bleeding. Be careful brushing or flossing your teeth or using a toothpick because you may get an infection or bleed more easily. If you have any dental work done, tell your dentist you are receiving this medication. Drink fluids as directed while you are taking this medication. This will help protect your kidneys. Call your care team if you get diarrhea. Do not treat yourself. Talk to your care team if you or your partner wish to become pregnant or think you might be pregnant. This medication can cause serious birth defects if taken during pregnancy and for 14 months after the last dose. A negative pregnancy test is required before starting this medication. A reliable form of contraception is recommended while taking this medication and for 14 months after the last dose. Talk to your care team about effective forms of contraception. Do not father a child while taking this medication and for 11 months after the last dose. Use a condom during sex during this time period. Do not breast-feed while taking this medication. This medication may cause infertility. Talk to your care team if you are concerned about your fertility. What side effects may I  notice from receiving this medication? Side effects that you should report to your care team as soon as possible: Allergic reactions--skin rash, itching, hives, swelling of the face, lips, tongue, or throat Eye pain, change in vision, vision loss Hearing loss, ringing in ears Infection--fever, chills, cough, sore throat, wounds that don't heal, pain or trouble when passing urine, general feeling of discomfort or being unwell Kidney injury--decrease in the amount of urine, swelling of the ankles, hands, or feet Low red blood cell level--unusual weakness or fatigue, dizziness, headache, trouble breathing Painful swelling, warmth,  or redness of the skin, blisters or sores at the infusion site Pain, tingling, or numbness in the hands or feet Unusual bruising or bleeding Side effects that usually do not require medical attention (report to your care team if they continue or are bothersome): Hair loss Nausea Vomiting This list may not describe all possible side effects. Call your doctor for medical advice about side effects. You may report side effects to FDA at 1-800-FDA-1088. Where should I keep my medication? This medication is given in a hospital or clinic. It will not be stored at home. NOTE: This sheet is a summary. It may not cover all possible information. If you have questions about this medicine, talk to your doctor, pharmacist, or health care provider.  2024 Elsevier/Gold Standard (2022-02-13 00:00:00)

## 2024-08-30 NOTE — Progress Notes (Signed)
 [facility]  ONCOLOGY CLINIC PROGRESS NOTE   Patient Care Team: Marvene Prentice SAUNDERS, FNP as PCP - General (Family Medicine) Carlie Clark, MD as Consulting Physician (Otolaryngology) Izell Domino, MD as Attending Physician (Radiation Oncology) Daryle Heron CROME, RD as Dietitian (Nutrition) Malmfelt, Delon CROME, RN as Oncology Nurse Navigator [name], MD as Consulting Physician (Oncology) Lauralee Chew, MD as Referring Physician (Otolaryngology)  PATIENT NAME: Gregory Jenkins   MR#: [id number] DOB: 06/24/1951  Date of visit: 08/30/2024   ASSESSMENT & PLAN:   Gregory Jenkins is a 73 y.o. gentleman with a past medical history of left tonsillar squamous cell carcinoma, managed with concurrent chemoradiation using cetuximab  in 2017, went into remission, was referred to our clinic for recently diagnosed right tonsillar cancer. cT2,cN0,cM0,p16- , Stage II.   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  He continued to have follow-ups up until 2022 with no evidence of disease clinically and on imaging.  Previously treated by Dr. Lonn in our clinic.  Now with a recurrence on the right side, p16 negative.  No evidence of metastatic disease on PET scan.  Please review oncology history for additional details and timeline of events.  He was seen in consultation by Dr. Lauralee (surgical ENT) on 06/28/24 to discuss surgical treatment options. Although surgery would likely provide him with the best outcome, Dr. 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 In this setting, Dr. Lauralee would like him to be considered for re-irradiation.   His case was discussed in our ENT tumor conference on 07/12/2024.  Consensus opinion is to proceed with concurrent chemoradiation with  cisplatin.  On 07/17/2024, MRI of the neck showed nodular thickening and enhancement of the right palatine tonsil extending to the soft palate, base of the uvula, and lateral/posterolateral oropharyngeal mucosa, compatible with oropharyngeal malignancy. 7 mm nodule in the superficial lobe of the right parotid corresponding to prior PET uptake, suspicious for primary parotid neoplasm or metastatic lymph node.  On 07/24/2024, core biopsy of the right parotid lesion showed bland oncocytic lesion, benign finding.  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   Plan made to proceed with concurrent chemoradiation using weekly cisplatin.  Started radiation treatments from 08/02/2024.  Received cycle 1 of cisplatin on 08/04/2024.   He has been tolerating treatments reasonably well without major side effects.  Labs today reveal no dose-limiting toxicities.  He is scheduled for cycle # 5 of cisplatin today. Will proceed with chemotherapy as scheduled.  We will arrange for 1 L of NS bolus at least once a week on nontreatment days.  This week he will receive IV fluids on Friday, 11/7 and on 11/11.  - Monitor kidney function and blood counts weekly - Ensure hydration with 70-80 ounces of fluid daily - Prescribed antiemetics for nausea management  He is maintaining nutrition without need for feeding tube.  I will see him in 1 week for follow-up with repeat labs and continuation of chemotherapy.  Oral and pharyngeal mucositis secondary to chemoradiation Experiencing oral and pharyngeal mucositis with throat irritation and phlegm. Throat soreness present but manageable, allowing continued oral intake. Using mouthwashes and salt and soda rinses for symptom management. - Continue mouthwashes and salt and soda rinses for symptom management.  Thrombocytopenia secondary to chemotherapy Platelet count slightly low at 112,000, likely due  to early timing in the treatment cycle. As long as  platelet count remains above 50,000, treatment can proceed safely. - Continue to monitor platelet levels closely. - Proceed with treatment as long as platelet count remains above 50,000.  Dehydration requiring intravenous fluid support Requiring intravenous fluid support to manage dehydration. Fluids administered today and planned for Friday to boost hydration over the weekend. Additional fluids planned for Tuesday next week to coincide with radiation therapy. - Administered intravenous fluids today. - Scheduled intravenous fluids for Friday and Tuesday next week.  Vitamin B12 deficiency Vitamin B12 deficiency managed with monthly B12 injections. Coordination with oncology clinic to administer B12 injections during treatment visits to reduce additional appointments. - Administer B12 injection at oncology clinic starting in October - Coordinate with family doctor to transition B12 administration to oncology clinic    I reviewed lab results and outside records for this visit and discussed relevant results with the patient. Diagnosis, plan of care and treatment options were also discussed in detail with the patient. Opportunity provided to ask questions and answers provided to his apparent satisfaction. Provided instructions to call our clinic with any problems, questions or concerns prior to return visit. I recommended to continue follow-up with PCP and sub-specialists. He verbalized understanding and agreed with the plan.   NCCN guidelines have been consulted in the planning of this patient's care.  I spent a total of 40 minutes during this encounter with the patient including review of chart and various tests results, discussions about plan of care and coordination of care plan.   [name], MD  08/30/2024 4:23 PM  [facility] - [facility] DE[facility] [address] [place] [address] Dept: [phone] Dept Fax:  [phone]    CHIEF COMPLAINT/ REASON FOR VISIT:   Recently diagnosed right tonsillar cancer. cT2,cN0,cM0,p16- , Stage II. Has history of left-sided tonsillar cancer treated in 2017 with chemoradiation using cetuximab .   Current Treatment: Started concurrent chemoradiation with weekly cisplatin from 08/02/2024.  INTERVAL HISTORY:    Discussed the use of AI scribe software for clinical note transcription with the patient, who gave verbal consent to proceed.  History of Present Illness  Gregory Jenkins is a 73 year old male undergoing chemotherapy and radiation therapy who presents for treatment scheduling and symptom management.  He is currently undergoing chemotherapy and radiation therapy, with recent adjustments to his treatment schedule. He prefers treatments to be scheduled on Fridays, as he has taken those days off work. Coordination of chemotherapy and fluid treatments with his radiation schedule is necessary to avoid delays.  His blood work is generally stable, with kidney function numbers remaining satisfactory. However, his platelet count is slightly low at 112, but still above the threshold of 50, allowing for the continuation of treatment. He is concerned about the scheduling challenges and the need to ensure his treatments are not delayed.  Nutritionally, he is maintaining his weight and is able to eat by mouth. He consumes foods like gravies, eggs, soups, and bread pudding. He is experiencing some phlegm and a sore throat, which are his most bothersome symptoms at present. Despite these symptoms, he is still able to eat and is using mouthwashes, including salt and soda, to manage oral irritation.  The patient reports that a previous clinician described the tumor as visible, but more recently described it as a reddened area. He has not experienced any breaks in his radiation  or chemotherapy schedule, which is important for his treatment continuity.   I have reviewed the  past medical history, past surgical history, social history and family history with the patient and they are unchanged from previous note.  HISTORY OF PRESENT ILLNESS:   ONCOLOGY HISTORY:   Initial diagnosis of left tonsillar cancer in 2017 for which he received radiation therapy to the left tonsil and involved bilateral cervical lymph nodes. This was concurrent with systemic therapy consisting of cetuximab . He was last seen by Rad Onc for a follow-up visit on 10/02/2016 and was NED on examination and imaging at that time.    He continued to follow with Dr. Carlie (ENT) and with Dr. Lonn under surveillance until 2022. He did have follow-up imaging performed during that interval of time which consisted of a follow-up neck CT on 02/13/2019 that showed NED and stable post treatment changes in the neck.    He did however return to Dr. Ilona office on 02/24/22 for evaluation of a spot on the back of his tongue. However, oral exam performed at that time did not show any abnormal findings to correlate with the reported concern.    He presented to Dr. Ilona office on 06/07/24 with a 3 month history of a sore throat which he characterized as feeling raw on the right side of his throat (with occasional extension to the roof of his mouth), as well as ear pressure. Oral exam performed at that time noted an exudative mass of the right tonsillar fossa. A biopsy of the right tonsillar mass was accordingly obtained at that time that showed findings consistent with invasive squamous cell carcinoma (moderately differentiated); p16 negative.    Soft tissue neck CT with contrast on 06/19/24 demonstrated: the right palatine tonsillar mass extending onto the soft palate and into the right glossotonsillar sulcus, consistent with SCC, measuring at least 26 mm. No abnormal lymph nodes were demonstrated in the neck. A chest CT was also performed that same day which showed several indeterminate punctate pulmonary nodules,  but no definite evidence of pulmonary metastatic disease.    He was seen in consultation by Dr. Lauralee (surgical ENT) on 06/28/24 to discuss surgical treatment options. Although surgery would likely provide him with the best outcome, Dr. 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 In this setting, Dr. Lauralee would like him to be considered for re-irradiation. He has also been referred back to medical oncology.   PET scan performed on 07/04/24 demonstrated: the hypermetabolic lesion in the vicinity of the right palatine tonsil consistent with malignancy, and with potential extension to the uvula, and a hypermetabolic nodule in the upper right parotid gland with potential differential considerations including primary parotid neoplasm vs a metastatic lymph node noted. Imaging otherwise showed no evidence of metastatic disease to the chest, abdomen, or pelvis. Other findings of potential clinical significance included a bladder calculus and prostatomegaly with a prominent median lobe indenting the bladder base.    His case was discussed in tumor conference on 07/12/2024.  Plan made to proceed with MRI of the neck for further evaluation and delineation of the tonsil mass.  Also plan for biopsy of the parotid lesion.  On 07/17/2024, MRI of the neck showed nodular thickening and enhancement of the right palatine tonsil extending to the soft palate, base of the uvula, and lateral/posterolateral oropharyngeal mucosa, compatible with oropharyngeal malignancy. 7 mm nodule in the superficial lobe of the right  parotid corresponding to prior PET uptake, suspicious for primary parotid neoplasm or metastatic lymph node.  On 07/24/2024, core biopsy of the right parotid lesion showed bland oncocytic lesion, benign finding.  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   Plan made to proceed with concurrent  chemoradiation using weekly cisplatin.  Started this from 08/02/2024.   Oncology History  Malignant neoplasm of tonsillar fossa (HCC)  11/12/2015 Procedure   Accession: [id number] FNA of left neck LN positive for squamous cell cancer   11/18/2015 Imaging   Left greater than right cervical lymphadenopathy suspicious for nodal metastatic disease.2. Mild asymmetry of the left tonsil -- correlate with direct visualization to assess for primary neoplasm.   11/20/2015 Procedure   He has left tonsil biopsy   11/20/2015 Pathology Results   Accession: [id number] Left tonsil biopsy showed squamous cell cancer, p16 positive.   11/28/2015 PET scan   1. Left palatine tonsil primary with left worse than right cervical nodal metastasis. 2. No extracervical hypermetabolic metastasis.    12/12/2015 Procedure   Peg and port-a-cath placement.   12/18/2015 - 01/29/2016 Chemotherapy   He received weekly cetuximab    12/18/2015 - 02/03/2016 Radiation Therapy   Received Helicle IMRT Tomotherapy:  Left tonsil and bilateral neck / 70 Gy in 35 fractions to gross disease, 63 Gy in 35 fractions to high risk nodal echelons, and 56 Gy in 35 fractions to intermediate risk nodal echelons.   12/31/2015 Adverse Reaction   Treatment #3 is placed on hold due to worsening mucositis   01/08/2016 Miscellaneous   Cetuximab  #3 resumed with 50% dose adjustment   01/14/2016 Adverse Reaction   Treatment #4 is placed on hold due to worsening mucositis and failure to thrive   03/20/2016 Imaging   CT abdomen showed mild gallbladder wall thickening with pericholecystic fluid and several gallstones. The gallbladder is nondistended. Recommend clinical correlation for acute or chronic cholecystitis.   04/15/2016 Procedure   PEG removed.   05/26/2016 Imaging   Restaging PET:  Marked interval improvement with resolution of left tonsillar hypermetabolism and right cervical lymph node hypermetabolism. Left cervical lymph nodes have decreased  substantially in size and there is a subtle degree of FDG hypermetabolism still associated with these left-sided cervical nodes.   10/12/2016 Procedure   Port-a-cath removed.   02/14/2019 Imaging   CT neck 1. Satisfactory post treatment appearance of the neck. NI-Rads category 1. 2. No mass or abnormality to correspond to the marked area of concern at the level of the right submandibular gland.     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  ALLERGIES: He has no known allergies.  MEDICATIONS:  Current Outpatient Medications  Medication Sig Dispense Refill   acetaminophen  (TYLENOL ) 500 MG tablet Take 1,000 mg by mouth every 6 (six) hours as needed for moderate pain.     aspirin  EC 81 MG tablet Take 81 mg by mouth daily.  atorvastatin  (LIPITOR) 10 MG tablet Take 10 mg by mouth daily.     cyanocobalamin (VITAMIN B12) 1000 MCG/ML injection Inject 1,000 mcg into the muscle every 30 (thirty) days.     dexamethasone  (DECADRON ) 4 MG tablet Take 2 tablets (8 mg) by mouth daily x 3 days starting the day after cisplatin chemotherapy. Take with food. 30 tablet 1   ipratropium (ATROVENT) 0.06 % nasal spray Place 2 sprays into both nostrils daily as needed for rhinitis.     levothyroxine  (SYNTHROID ) 125 MCG tablet Take 125 mcg by mouth daily before breakfast.  3   lidocaine  (XYLOCAINE ) 2 % solution Patient: Mix 1part 2% viscous lidocaine , 1part H20. Swish & swallow 10mL of diluted  mixture, 30min before meals and at bedtime, up to QID 200 mL 3   lidocaine -prilocaine  (EMLA ) cream Apply to affected area once 30 g 3   mirtazapine (REMERON) 7.5 MG tablet Take 1 tablet (7.5 mg total) by mouth at bedtime. 30 tablet 1   ondansetron  (ZOFRAN ) 8 MG tablet Take 1 tablet (8 mg total) by mouth every 8 (eight) hours as needed for nausea or vomiting. Start on the third day after cisplatin. 30 tablet 1   prochlorperazine  (COMPAZINE ) 10 MG tablet Take 1 tablet (10 mg total) by mouth every 6 (six) hours as needed (Nausea or vomiting). 30 tablet 1   No current facility-administered medications for this visit.   Facility-Administered Medications Ordered in Other Visits  Medication Dose Route Frequency Provider Last Rate Last Admin   0.9 %  sodium chloride  infusion   Intravenous Continuous Breella Vanostrand, MD 10 mL/hr at 08/30/24 1033 New Bag at 08/30/24 1033     VITALS:   Blood pressure 114/62, pulse 69, temperature 98.1 F (36.7 C), temperature source Temporal, resp. rate 15, height 5' 11 (1.803 m), weight 161 lb 8 oz (73.3 kg), SpO2 99%.  Wt Readings from Last 3 Encounters:  08/30/24 161 lb 8 oz (73.3 kg)  08/30/24 161 lb 8 oz (73.3 kg)  08/23/24 161 lb 8 oz (73.3 kg)    Body mass index is 22.52 kg/m.    Onc Performance Status - 08/30/24 0931       ECOG Perf Status   ECOG Perf Status Restricted in physically strenuous activity but ambulatory and able to carry out work of a light or sedentary nature, e.g., light house work, office work      KPS SCALE   KPS % SCORE Cares for self, unable to carry on normal activity or to do active work           PHYSICAL EXAM:   Physical Exam Constitutional:      General: He is not in acute distress.    Appearance: Normal appearance.  HENT:     Head: Normocephalic and atraumatic.  Eyes:     Conjunctiva/sclera: Conjunctivae normal.  Neck:     Comments: Post radiation therapy fibrosis noted in the neck area, well-healed.   No  definite palpable cervical lymphadenopathy Cardiovascular:     Rate and Rhythm: Normal rate and regular rhythm.  Pulmonary:     Effort: Pulmonary effort is normal. No respiratory distress.  Abdominal:     General: There is no distension.  Neurological:     General: No focal deficit present.     Mental Status: He is alert and oriented to person, place, and time.  Psychiatric:        Mood and Affect: Mood normal.        Behavior:  Behavior normal.      LABORATORY DATA:   I have reviewed the data as listed.  Results for orders placed or performed in visit on 08/30/24  Rad Onc Aria Session Summary  Result Value Ref Range   Course ID [id number]    Course Start Date 07/18/2024    Session Number 21    Course First Treatment Date 08/02/2024  3:17 PM    Course Last Treatment Date 08/30/2024  8:05 AM    Course Elapsed Days 28    Reference Point ID HN_R_Tonsil DP    Reference Point Dosage Given to Date 42 Gy   Reference Point Session Dosage Given 2 Gy   Plan ID HN_R_Tonsil    Plan Name HN_R_Tonsil    Plan Fractions Treated to Date 21    Plan Total Fractions Prescribed 33    Plan Prescribed Dose Per Fraction 2 Gy   Plan Total Prescribed Dose 66.000000 Gy   Plan Primary Reference Point HN_R_Tonsil DP       RADIOGRAPHIC STUDIES:  No recent pertinent imaging available to review.   CODE STATUS:  Code Status History     Date Active Date Inactive Code Status Order ID Comments User Context   07/19/2024 1643 07/20/2024 0515 Full Code [id number]  Hughes Simmonds, MD [facility]   06/20/2023 1423 06/23/2023 1934 Full Code [id number]  Waddell Rake, MD ED   05/31/2023 1005 06/02/2023 0101 Full Code [id number]  Magda Debby SAILOR, MD Inpatient    Questions for Most Recent Historical Code Status (Order [id number])     Question Answer   By: Consent: discussion documented in EHR            No orders of the defined types were placed in this encounter.    Future Appointments  Date Time Provider Department  Center  08/31/2024 11:15 AM [facility] 3 [facility] None  08/31/2024 11:30 AM Jacelyn Lupita NOVAK, CCC-SLP [facility]  09/01/2024  8:30 AM [facility] None  09/01/2024 11:15 AM [facility] None  09/04/2024 11:15 AM [facility] None  09/04/2024 11:30 AM [facility] None  09/05/2024  8:30 AM [facility] 2 [facility] None  09/05/2024 11:15 AM [facility] 3 [facility] None  09/06/2024 11:15 AM [facility] 3 [facility] None  09/07/2024 11:15 AM [facility] 3 [facility] None  09/08/2024 11:15 AM [facility] 3 [facility] None  09/11/2024 11:15 AM [facility] 3 [facility] None  09/12/2024 11:15 AM [facility] 3 [facility] None  09/13/2024 11:15 AM [facility] 4 [facility] None  09/14/2024 11:15 AM [facility] 4 [facility] None  09/15/2024 11:15 AM [facility] None  10/10/2024 10:00 AM Breedlove Blue, Blaire L, PT [facility] None     This document was completed utilizing speech recognition software. Grammatical errors, random word insertions, pronoun errors, and incomplete sentences are an occasional consequence of this system due to software limitations, ambient noise, and hardware issues. Any formal questions or concerns about the content, text or information contained within the body of this dictation should be directly addressed to the provider for clarification.

## 2024-08-31 ENCOUNTER — Other Ambulatory Visit: Payer: Self-pay

## 2024-08-31 ENCOUNTER — Ambulatory Visit
Admission: RE | Admit: 2024-08-31 | Discharge: 2024-08-31 | Disposition: A | Discharge status: Home / Self Care | Source: Ambulatory Visit | Attending: Radiation Oncology | Admitting: Radiation Oncology

## 2024-08-31 ENCOUNTER — Ambulatory Visit: Attending: Radiation Oncology

## 2024-08-31 DIAGNOSIS — R1312 Dysphagia, oropharyngeal phase: Secondary | ICD-10-CM | POA: Diagnosis present

## 2024-08-31 DIAGNOSIS — Z510 Encounter for antineoplastic radiation therapy: Secondary | ICD-10-CM | POA: Diagnosis not present

## 2024-08-31 LAB — RAD ONC ARIA SESSION SUMMARY
Course Elapsed Days: 29
Plan Fractions Treated to Date: 22
Plan Prescribed Dose Per Fraction: 2 Gy
Plan Total Fractions Prescribed: 33
Plan Total Prescribed Dose: 66 Gy
Reference Point Dosage Given to Date: 44 Gy
Reference Point Session Dosage Given: 2 Gy
Session Number: 22

## 2024-08-31 NOTE — Therapy (Signed)
 OUTPATIENT SPEECH LANGUAGE PATHOLOGY ONCOLOGY TREATMENT   Patient Name: Gregory Jenkins MRN: [id number] DOB:1951-04-20, 73 y.o., male Today's Date: 08/31/2024  PCP: Marvene Barter, MD REFERRING PROVIDER: Izell Domino, MD  END OF SESSION:  End of Session - 08/31/24 2300     Visit Number 2    Number of Visits 3    Date for Recertification  10/25/24    SLP Start Time 1150    SLP Stop Time  1230    SLP Time Calculation (min) 40 min    Activity Tolerance Patient tolerated treatment well           Past Medical History:  Diagnosis Date   Abdominal pain 03/19/2016   Allergic rhinitis    Cancer of tonsillar fossa (HCC)    left   Clotting disorder    PE, suspect lupus anticoagulant   Drug-induced skin rash 12/25/2015   History of kidney stones 2000   History of nephrolithiasis    History of pulmonary embolism    History of radiation therapy 12/17/2015- 02/03/16   Left Tonsil and Bilateral Neck   Hypothyroidism    Mucositis oral 01/08/2016   Sleep apnea 2000   Thyroid  disease    Tonsil cancer [facility])    Past Surgical History:  Procedure Laterality Date   AMPUTATION Left 01/01/2018   Procedure: REVISION AMPUTATION RING FINGER;  Surgeon: Shari Easter, MD;  Location: [facility] OR;  Service: Orthopedics;  Laterality: Left;   CARPAL TUNNEL RELEASE Left 11/14/2021   Procedure: LEFT CARPAL TUNNEL RELEASE;  Surgeon: Murrell Drivers, MD;  Location: [facility];  Service: Orthopedics;  Laterality: Left;  30 MIN   CHOLECYSTECTOMY N/A 06/22/2023   Procedure: LAPAROSCOPIC CHOLECYSTECTOMY WITH ICG DYE;  Surgeon: Vanderbilt Ned, MD;  Location: [facility] OR;  Service: General;  Laterality: N/A;   EYE SURGERY Right 1980's   H/O right orbital blowout fracture   FOOT SURGERY Left    [facility] Neuroma   GASTROSTOMY TUBE PLACEMENT     IR GASTROSTOMY TUBE REMOVAL     IR GENERIC HISTORICAL  10/12/2016   IR REMOVAL TUN ACCESS W/ PORT W/O FL MOD SED 10/12/2016 Ami Bellman, DO [facility]-INTER[facility] RAD   IR  IMAGING GUIDED PORT INSERTION  07/19/2024   KNEE SURGERY Right    Arthroscopic   PORTA CATH INSERTION     RIB RESECTION Left 05/31/2023   Procedure: LEFT FIRST RIB EXCISION;  Surgeon: Magda Ned SAILOR, MD;  Location[facility] OR;  Service: Vascular;  Laterality: Left;   VENOGRAM N/A 05/31/2023   Procedure: LEFT UPPER EXTREMITY AND CENTRAL VENOGRAM;  Surgeon: Magda Ned SAILOR, MD;  Location: [facility] OR;  Service: Vascular;  Laterality: N/A;   Patient Active Problem List   Diagnosis Date Noted   Hypotension 08/09/2024   Hearing loss 08/02/2024   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   Vitamin B12 deficiency 07/12/2024   Acute cholecystitis 06/20/2023   Acute deep vein thrombosis (DVT) of left upper extremity (HCC) 06/20/2023   Hypothyroidism 06/20/2023   Thoracic outlet syndrome 05/31/2023   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 He was treated with radiation and immunotherapy at that time to his left tonsil and bilateral neck. He presented to Dr. Izell to discuss the role of radiation therapy in management of his right tonsillar SCC. 06/07/24 He presented to Dr. Carlie with a 3 month history of a sore throat which he characterized as feeling raw on the right side of his throat as well as ear pressure. Oral exam noted a exudative mass of the right tonsillar fossa. Biopsy obtained at that time revealed invasive SCC, p 16 -. 06/19/24 CT neck  demonstrated the right palatine tonsillar mass extending onto the soft palate and into the right glossotonsillar sulcus, consistent with SCC, measuring at least 26 mm. No abnormal lymph nodes were demonstrated in the neck. A chest CT was also performed that same day which showed several indeterminate punctate pulmonary nodules, but no definite evidence of pulmonary metastatic disease. 06/28/24 He saw Dr. Lauralee to discuss surgical options. Although surgery would likely provide him with the best outcome, Dr. 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 In this setting, Dr. Lauralee would like him to be considered for re-irradiation which we will discuss in detail today. He has also been referred back to medical oncology and will meet with Dr. Autumn on 07/12/24 to discuss systemic therapy options. 07/04/24 PET demonstrated the hypermetabolic lesion in the vicinity of the right palatine tonsil consistent with malignancy, and with potential extension to the uvula, and a hypermetabolic nodule in the upper right parotid gland with potential differential considerations including primary parotid neoplasm vs a metastatic lymph node noted. Imaging otherwise showed no evidence of metastatic disease to the chest, abdomen, or pelvis. Other findings of potential clinical significance included a bladder calculus and prostatomegaly with a prominent median lobe indenting the bladder base. 07/07/24 Consult with Dr. Izell, 07/12/24 Consult with Dr. Autumn. He will receive radiation/chemotherapy. Treatment plan:  He will receive 33 fractions of radiation to his right tonsil with weekly chemotherapy. He'll start on 08/02/24 and will complete 09/15/24. Pretreatment procedures:07/19/24 PAC, PEG will be scheduled in the 5th/6th week of treatment  PAIN:  Are you having pain? No  FALLS: Has patient fallen in last 6 months?  No   PATIENT GOALS: maintain WNL  swallow function  OBJECTIVE:  Note: Objective measures were completed at Evaluation unless otherwise noted.                                                                                                                            TREATMENT DATE:   08/31/24: Pt eating soft diet (chipped beef over bread, scrambled eggs over toast, black eyed peas, pancakes). Pt ate egg salad sandwich and drank water today without any overt s/sx pharyngeal deficits. Mastication time was extended. Pt denies overt s/sx pharyngeal deficits during POs.  With HEP pt has been doing effortful, CTAR, Masako, but has not been performing Mendelsohn as much. Today pt told SLP rationale for HEP without cues. He performed 5 reps each exercise for SLP to assess accuracy with HEP. He req'd rare min A.    07/27/24: Research states the risk for dysphagia increases due to radiation and/or chemotherapy treatment due to a variety of factors, so SLP educated the pt about the possibility of reduced/limited ability for PO intake during rad tx. SLP also educated pt regarding possible changes to swallowing musculature after rad tx, and why adherence to dysphagia HEP provided today and PO consumption was necessary to inhibit muscle fibrosis following rad tx and to mitigate muscle disuse atrophy. SLP informed pt why this would be detrimental to their swallowing status and to their pulmonary health. Pt demonstrated understanding of these things to SLP. SLP encouraged pt to safely eat and drink as deep into their radiation/chemotherapy as possible to provide the best possible long-term swallowing outcome for pt.  SLP then developed an individualized HEP for pt involving oral and pharyngeal strengthening and ROM and pt was instructed how to perform these exercises, including SLP demonstration. After SLP demonstration, pt return demonstrated each exercise. SLP ensured pt performance was correct prior to educating pt on next exercise. Pt required usual mod  cues faded to modified independent to perform HEP. Pt was instructed to complete this program 6-7 days/week, at least 2 times a day until 6 months after his or her last day of rad tx, and then x2 a week after that, indefinitely. Among other modifications for days when pt cannot functionally swallow, SLP also suggested pt to perform only non-swallowing tasks on the handout/HEP, and if necessary to cycle through the swallowing portion so the full program of exercises can be completed instead of fatiguing on one of the swallowing exercises and being unable to perform the other swallowing exercises. SLP instructed that swallowing exercises should then be added back into the regimen as pt is able to do so.   PATIENT EDUCATION: Education details: HEP procedure and modification to HEP when difficulty experienced with swallowing during and after radiation course Person educated: Patient Education method: Explanation, Demonstration, and Verbal cues Education comprehension: verbalized understanding, returned demonstration, verbal cues required, and needs further education   ASSESSMENT:  CLINICAL IMPRESSION: Patient is a 73 y.o. M who was seen today for treatment of swallowing as they undergo radiation/chemoradiation therapy. Today pt ate  egg salad sandwich and drank thin liquids without overt s/s oral or pharyngeal difficulty. At this time pt swallowing is deemed WNL/WFL with these POs. No oral or overt s/sx pharyngeal deficits, including aspiration were observed. There are no overt s/s aspiration PNA observed by SLP nor any reported by pt at this time. Data indicate that pt's swallow ability will likely decrease over the course of radiation/chemoradiation therapy and could very well decline over time following the conclusion of that therapy due to muscle disuse atrophy and/or muscle fibrosis. Pt will cont to need to be seen by SLP in order to assess safety of PO intake, assess the need for recommending any  objective swallow assessment, and ensuring pt is correctly completing the individualized HEP.  OBJECTIVE IMPAIRMENTS: include dysphagia. These impairments are limiting patient from safety when swallowing. Factors affecting potential to achieve goals and functional outcome are none noted today. Patient will benefit from skilled SLP services to address above impairments and improve overall function.   REHAB POTENTIAL: Good  GOALS: Goals reviewed with patient? No   SHORT TERM GOALS: Target: 3rd total session   1. Pt will complete HEP with modified independence in 2 sessions Baseline: Goal status: INITIAL   2.  pt will tell SLP why pt is completing HEP with modified independence Baseline:  Goal status: met   3.  pt will describe 3 overt s/s aspiration PNA with modified independence Baseline:  Goal status: INITIAL   4.  pt will tell SLP how a food journal could hasten return to a more normalized diet Baseline:  Goal status: INITIAL     LONG TERM GOALS: Target: 7th total session   1.  pt will complete HEP with independence over two visits Baseline:  Goal status: INITIAL   2.  pt will describe how to modify HEP over time, and the timeline associated with reduction in HEP frequency with modified independence over two sessions Baseline:  Goal status: INITIAL     PLAN:   SLP FREQUENCY:  once approx every 4 weeks   SLP DURATION:  7 sessions   PLANNED INTERVENTIONS: Aspiration precaution training, Pharyngeal strengthening exercises, Diet toleration management , Trials of upgraded texture/liquids, SLP instruction and feedback, Compensatory strategies, and Patient/family education, [phone] (treatment of swallowing dysfunction and/or oral function for feeding)   Bridget , CCC-SLP 08/31/2024, 11:00 PM

## 2024-09-01 ENCOUNTER — Inpatient Hospital Stay: Admitting: Physician Assistant

## 2024-09-01 ENCOUNTER — Inpatient Hospital Stay

## 2024-09-01 ENCOUNTER — Other Ambulatory Visit (HOSPITAL_COMMUNITY): Payer: Self-pay

## 2024-09-01 ENCOUNTER — Ambulatory Visit
Admission: RE | Admit: 2024-09-01 | Discharge: 2024-09-01 | Disposition: A | Discharge status: Home / Self Care | Source: Ambulatory Visit | Attending: Radiation Oncology | Admitting: Radiation Oncology

## 2024-09-01 ENCOUNTER — Other Ambulatory Visit: Payer: Self-pay

## 2024-09-01 VITALS — BP 111/66 | HR 64 | Temp 97.9°F | Resp 18

## 2024-09-01 VITALS — BP 126/64 | HR 62 | Temp 97.9°F | Resp 14

## 2024-09-01 DIAGNOSIS — E538 Deficiency of other specified B group vitamins: Secondary | ICD-10-CM

## 2024-09-01 DIAGNOSIS — Z510 Encounter for antineoplastic radiation therapy: Secondary | ICD-10-CM | POA: Diagnosis not present

## 2024-09-01 DIAGNOSIS — C099 Malignant neoplasm of tonsil, unspecified: Secondary | ICD-10-CM

## 2024-09-01 DIAGNOSIS — B370 Candidal stomatitis: Secondary | ICD-10-CM

## 2024-09-01 LAB — RAD ONC ARIA SESSION SUMMARY
Course Elapsed Days: 30
Plan Fractions Treated to Date: 23
Plan Prescribed Dose Per Fraction: 2 Gy
Plan Total Fractions Prescribed: 33
Plan Total Prescribed Dose: 66 Gy
Reference Point Dosage Given to Date: 46 Gy
Reference Point Session Dosage Given: 2 Gy
Session Number: 23

## 2024-09-01 MED ORDER — SODIUM CHLORIDE 0.9 % IV SOLN: Freq: Once | INTRAVENOUS | Status: AC

## 2024-09-01 MED ORDER — ONDANSETRON HCL 4 MG/2ML IJ SOLN: 8.0000 mg | Freq: Once | INTRAMUSCULAR | Status: DC

## 2024-09-01 MED ORDER — NYSTATIN 100000 UNIT/ML MT SUSP
5.0000 mL | Freq: Four times a day (QID) | OROMUCOSAL | 1 refills | Status: DC | PRN
  Filled 2024-09-01: qty 180, 9d supply, fill #0

## 2024-09-01 NOTE — Patient Instructions (Signed)

## 2024-09-01 NOTE — Progress Notes (Signed)
 Symptom Management Consult Note [facility]    Patient Care Team: Marvene Prentice SAUNDERS, FNP as PCP - General (Family Medicine) Carlie Clark, MD as Consulting Physician (Otolaryngology) Izell Domino, MD as Attending Physician (Radiation Oncology) Daryle Heron CROME, RD as Dietitian (Nutrition) Malmfelt, Delon CROME, RN as Oncology Nurse Navigator [name], MD as Consulting Physician (Oncology) Lauralee Chew, MD as Referring Physician (Otolaryngology)    Name / MRN / Gregory Jenkins  [id number]  01-19-1951   Date of visit: 09/01/2024   Chief Complaint/Reason for visit: mouth pain   Current Therapy: Cisplatin  Last treatment:  Day 1   Cycle 5 on 08/30/24    ASSESSMENT AND PLAN Patient is a 73 y.o. 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 Autumn.  I have viewed most recent oncology note and lab work.  #Right tonsillar squamous cell carcinoma. Stage II  - Next appointment with oncologist is 09/07/24   #Oral thrush - Exam consistent with thrush. Will prescribe magic mouth wash including lidocaine  and nystatin. Will have patient temporary stop using viscous lidocaine  while using this rinse. Patient and spouse agree. Grade 2 - Patient receiving IVF today for supportive care per oncologist. He will return next week for the same. If symptoms do not improve can consider oral fluconazole. Patient able to tolerate PO medications and food.  Strict ED precautions discussed should symptoms worsen.   HEME/ONC HISTORY Oncology History  Malignant neoplasm of tonsillar fossa (HCC)  11/12/2015 Procedure   Accession: [id number] FNA of left neck LN positive for squamous cell cancer   11/18/2015 Imaging   Left greater than right cervical lymphadenopathy suspicious for nodal metastatic disease.2. Mild asymmetry of the left tonsil -- correlate with direct visualization to assess for primary neoplasm.   11/20/2015  Procedure   He has left tonsil biopsy   11/20/2015 Pathology Results   Accession: [id number] Left tonsil biopsy showed squamous cell cancer, p16 positive.   11/28/2015 PET scan   1. Left palatine tonsil primary with left worse than right cervical nodal metastasis. 2. No extracervical hypermetabolic metastasis.    12/12/2015 Procedure   Peg and port-a-cath placement.   12/18/2015 - 01/29/2016 Chemotherapy   He received weekly cetuximab    12/18/2015 - 02/03/2016 Radiation Therapy   Received Helicle IMRT Tomotherapy:  Left tonsil and bilateral neck / 70 Gy in 35 fractions to gross disease, 63 Gy in 35 fractions to high risk nodal echelons, and 56 Gy in 35 fractions to intermediate risk nodal echelons.   12/31/2015 Adverse Reaction   Treatment #3 is placed on hold due to worsening mucositis   01/08/2016 Miscellaneous   Cetuximab  #3 resumed with 50% dose adjustment   01/14/2016 Adverse Reaction   Treatment #4 is placed on hold due to worsening mucositis and failure to thrive   03/20/2016 Imaging   CT abdomen showed mild gallbladder wall thickening with pericholecystic fluid and several gallstones. The gallbladder is nondistended. Recommend clinical correlation for acute or chronic cholecystitis.   04/15/2016 Procedure   PEG removed.   05/26/2016 Imaging   Restaging PET:  Marked interval improvement with resolution of left tonsillar hypermetabolism and right cervical lymph node hypermetabolism. Left cervical lymph nodes have decreased substantially in size and there is a subtle degree of FDG hypermetabolism still associated with these left-sided cervical nodes.   10/12/2016 Procedure   Port-a-cath removed.   02/14/2019 Imaging   CT neck 1. Satisfactory post treatment appearance of  the neck. NI-Rads category 1. 2. No mass or abnormality to correspond to the marked area of concern at the level of the right submandibular gland.     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    Gregory Jenkins is a 73 y.o. male with oncologic history as above presenting to [facility] today with chief complaint of mouth pain. Accompanied to clinic today by spouse who provides additional history.  Mouth pain began suddenly yesterday, described as a 'light switch' change. He is currently undergoing radiation therapy with two weeks remaining. Lidocaine  mouthwash is being used more regularly for discomfort over the last 2 days.. No fevers have been experienced.  He maintains good fluid intake, consuming four to five bottles of fluid daily. Weekly fluid infusions have been started recently for supportive care.   No nausea is experienced, and medications are taken orally. His diet includes soft foods like mashed potatoes, green beans, and sweet potatoes.  Baking soda and salt rinses are performed twice daily. This is his second round of treatment, and thrush was not experienced during the first round. He is attempting to manage without a feeding tube this time, unlike previous treatment.     ROS  All other systems are reviewed and are negative for acute change except as noted in the HPI.    No Known Allergies   Past Medical History:  Diagnosis Date   Abdominal pain 03/19/2016   Allergic rhinitis    Cancer of tonsillar fossa (HCC)    left   Clotting disorder    PE, suspect lupus anticoagulant   Drug-induced skin rash 12/25/2015   History of kidney stones 2000   History of  nephrolithiasis    History of pulmonary embolism    History of radiation therapy 12/17/2015- 02/03/16   Left Tonsil and Bilateral Neck   Hypothyroidism    Mucositis oral 01/08/2016   Sleep apnea 2000   Thyroid  disease    Tonsil cancer [facility])      Past Surgical History:  Procedure Laterality Date   AMPUTATION Left 01/01/2018   Procedure: REVISION AMPUTATION RING FINGER;  Surgeon: Shari Easter, MD;  Location: [facility] OR;  Service: Orthopedics;  Laterality: Left;   CARPAL TUNNEL RELEASE Left 11/14/2021   Procedure: LEFT CARPAL TUNNEL RELEASE;  Surgeon: Murrell Drivers, MD;  Location: [facility];  Service: Orthopedics;  Laterality: Left;  30 MIN   CHOLECYSTECTOMY N/A 06/22/2023   Procedure: LAPAROSCOPIC CHOLECYSTECTOMY WITH ICG DYE;  Surgeon: Vanderbilt Ned, MD;  Location: [facility] OR;  Service: General;  Laterality: N/A;   EYE SURGERY Right 1980's   H/O right orbital blowout fracture   FOOT SURGERY Left    [facility] Neuroma   GASTROSTOMY TUBE PLACEMENT     IR GASTROSTOMY TUBE REMOVAL     IR GENERIC HISTORICAL  10/12/2016   IR REMOVAL TUN ACCESS W/ PORT W/O FL MOD SED 10/12/2016 Ami Bellman, DO [facility]-INTERV RAD  IR IMAGING GUIDED PORT INSERTION  07/19/2024   KNEE SURGERY Right    Arthroscopic   PORTA CATH INSERTION     RIB RESECTION Left 05/31/2023   Procedure: LEFT FIRST RIB EXCISION;  Surgeon: Magda Debby SAILOR, MD;  Location: [facility] OR;  Service: Vascular;  Laterality: Left;   VENOGRAM N/A 05/31/2023   Procedure: LEFT UPPER EXTREMITY AND CENTRAL VENOGRAM;  Surgeon: Magda Debby SAILOR, MD;  Location: [facility] OR;  Service: Vascular;  Laterality: N/A;    Social History   Socioeconomic History   Marital status: Married    Spouse name: Not on file   Number of children: 3   Years of education: Not on file   Highest education level: Not on file  Occupational History   Not on file  Tobacco Use   Smoking status: Never   Smokeless tobacco: Former    Types: Chew    Quit date: 11/20/2006  Vaping Use    Vaping status: Never Used  Substance and Sexual Activity   Alcohol use: Yes    Alcohol/week: 0.0 standard drinks of alcohol    Comment: He reports he is a social drinker   Drug use: No   Sexual activity: Not on file  Other Topics Concern   Not on file  Social History Narrative   Not on file   Social Drivers of Health   Financial Resource Strain: Not on file  Food Insecurity: No Food Insecurity (07/12/2024)   Hunger Vital Sign    Worried About Running Out of Food in the Last Year: Never true    Ran Out of Food in the Last Year: Never true  Transportation Needs: No Transportation Needs (07/12/2024)   PRAPARE - Administrator, Civil Service (Medical): No    Lack of Transportation (Non-Medical): No  Physical Activity: Not on file  Stress: Not on file  Social Connections: Not on file  Intimate Partner Violence: Not At Risk (07/12/2024)   Humiliation, Afraid, Rape, and Kick questionnaire    Fear of Current or Ex-Partner: No    Emotionally Abused: No    Physically Abused: No    Sexually Abused: No    Family History  Problem Relation Age of Onset   COPD Mother    Cancer Father        esophageal ca   Lung cancer Brother    Cancer Maternal Aunt        breast ca   Cancer Maternal Grandmother        brain ca   Cancer Maternal Grandfather        lung ca     Current Outpatient Medications:    magic mouthwash (nystatin, lidocaine , diphenhydrAMINE , alum & mag hydroxide) suspension, Swish and spit 5 mLs 4 (four) times daily as needed for mouth pain. Can swish and swallow if needed, Disp: 180 mL, Rfl: 1   acetaminophen  (TYLENOL ) 500 MG tablet, Take 1,000 mg by mouth every 6 (six) hours as needed for moderate pain., Disp: , Rfl:    aspirin  EC 81 MG tablet, Take 81 mg by mouth daily., Disp: , Rfl:    atorvastatin  (LIPITOR) 10 MG tablet, Take 10 mg by mouth daily., Disp: , Rfl:    cyanocobalamin (VITAMIN B12) 1000 MCG/ML injection, Inject 1,000 mcg into the muscle every 30  (thirty) days., Disp: , Rfl:    dexamethasone  (DECADRON ) 4 MG tablet, Take 2 tablets (8 mg) by mouth daily x 3 days starting the day after cisplatin chemotherapy. Take with  food., Disp: 30 tablet, Rfl: 1   ipratropium (ATROVENT) 0.06 % nasal spray, Place 2 sprays into both nostrils daily as needed for rhinitis., Disp: , Rfl:    levothyroxine  (SYNTHROID ) 125 MCG tablet, Take 125 mcg by mouth daily before breakfast., Disp: , Rfl: 3   lidocaine  (XYLOCAINE ) 2 % solution, Patient: Mix 1part 2% viscous lidocaine , 1part H20. Swish & swallow 10mL of diluted mixture, before meals and at bedtime, up to QID, Disp: 200 mL, Rfl: 3   lidocaine -prilocaine  (EMLA ) cream, Apply to affected area once, Disp: 30 g, Rfl: 3   mirtazapine (REMERON) 7.5 MG tablet, Take 1 tablet (7.5 mg total) by mouth at bedtime., Disp: 30 tablet, Rfl: 1   ondansetron  (ZOFRAN ) 8 MG tablet, Take 1 tablet (8 mg total) by mouth every 8 (eight) hours as needed for nausea or vomiting. Start on the third day after cisplatin., Disp: 30 tablet, Rfl: 1   prochlorperazine  (COMPAZINE ) 10 MG tablet, Take 1 tablet (10 mg total) by mouth every 6 (six) hours as needed (Nausea or vomiting)., Disp: 30 tablet, Rfl: 1 No current facility-administered medications for this visit.  Facility-Administered Medications Ordered in Other Visits:    ondansetron  (ZOFRAN ) injection 8 mg, 8 mg, Intravenous, Once, Pasam, Avinash, MD  PHYSICAL EXAM ECOG FS:1 - Symptomatic but completely ambulatory    Vitals:   09/01/24 0834  BP: 111/66  Pulse: 64  Resp: 18  Temp: 97.9 F (36.6 C)  TempSrc: Oral  SpO2: 100%   Physical Exam Vitals and nursing note reviewed.  Constitutional:      Appearance: He is not ill-appearing or toxic-appearing.  HENT:     Head: Normocephalic.     Mouth/Throat:     Comments: White patches on tongue and buccal mucosa Eyes:     Conjunctiva/sclera: Conjunctivae normal.  Cardiovascular:     Rate and Rhythm: Normal rate.   Pulmonary:     Effort: Pulmonary effort is normal.  Abdominal:     General: There is no distension.  Musculoskeletal:     Cervical back: Normal range of motion.  Skin:    General: Skin is warm and dry.  Neurological:     Mental Status: He is alert.        LABORATORY DATA I have reviewed the data as listed    Latest Ref Rng & Units 08/29/2024   10:40 AM 08/22/2024    9:32 AM 08/17/2024   10:39 AM  CBC  WBC 4.0 - 10.5 K/uL 4.7  5.3  8.3   Hemoglobin 13.0 - 17.0 g/dL 86.9  85.7  85.3   Hematocrit 39.0 - 52.0 % 37.1  41.7  41.8   Platelets 150 - 400 K/uL 112  149  147         Latest Ref Rng & Units 08/29/2024   10:40 AM 08/22/2024    9:32 AM 08/17/2024   10:39 AM  CMP  Glucose 70 - 99 mg/dL 889  870  872   BUN 8 - 23 mg/dL 23  20  22    Creatinine 0.61 - 1.24 mg/dL 9.16  9.14  9.09   Sodium 135 - 145 mmol/L 135  135  136   Potassium 3.5 - 5.1 mmol/L 4.0  3.7  4.4   Chloride 98 - 111 mmol/L 100  100  102   CO2 22 - 32 mmol/L 30  29  30    Calcium  8.9 - 10.3 mg/dL 9.0  9.5  9.4  RADIOGRAPHIC STUDIES (from last 24 hours if applicable) I have personally reviewed the radiological images as listed and agreed with the findings in the report. No results found.      Visit Diagnosis: 1. Squamous cell carcinoma of right tonsil (HCC)   2. Thrush, oral      No orders of the defined types were placed in this encounter.   All questions were answered. The patient knows to call the clinic with any problems, questions or concerns. No barriers to learning was detected.  A total of more than 30 minutes were spent on this encounter with face-to-face time and non-face-to-face time, including preparing to see the patient, ordering tests and/or medications, counseling the patient and coordination of care as outlined above.    Thank you for allowing me to participate in the care of this patient.    Jakeim Sedore E  Walisiewicz, PA-C Department of Hematology/Oncology [facility] Phone: [phone]  [phone]    09/01/2024 10:09 AM

## 2024-09-04 ENCOUNTER — Ambulatory Visit
Admission: RE | Admit: 2024-09-04 | Discharge: 2024-09-04 | Disposition: A | Discharge status: Home / Self Care | Source: Ambulatory Visit | Attending: Radiation Oncology | Admitting: Radiation Oncology

## 2024-09-04 ENCOUNTER — Other Ambulatory Visit: Payer: Self-pay

## 2024-09-04 ENCOUNTER — Other Ambulatory Visit: Payer: Self-pay | Admitting: Radiation Oncology

## 2024-09-04 ENCOUNTER — Other Ambulatory Visit (HOSPITAL_COMMUNITY): Payer: Self-pay

## 2024-09-04 DIAGNOSIS — Z510 Encounter for antineoplastic radiation therapy: Secondary | ICD-10-CM | POA: Diagnosis not present

## 2024-09-04 DIAGNOSIS — C099 Malignant neoplasm of tonsil, unspecified: Secondary | ICD-10-CM

## 2024-09-04 LAB — RAD ONC ARIA SESSION SUMMARY
Course Elapsed Days: 33
Plan Fractions Treated to Date: 24
Plan Prescribed Dose Per Fraction: 2 Gy
Plan Total Fractions Prescribed: 33
Plan Total Prescribed Dose: 66 Gy
Reference Point Dosage Given to Date: 48 Gy
Reference Point Session Dosage Given: 2 Gy
Session Number: 24

## 2024-09-04 MED ORDER — NYSTATIN 100000 UNIT/ML MT SUSP
5.0000 mL | Freq: Four times a day (QID) | OROMUCOSAL | 3 refills | Status: AC | PRN
  Filled 2024-09-04 – 2024-09-05 (×2): qty 360, 18d supply, fill #0
  Filled 2024-09-07: qty 280, 14d supply, fill #0
  Filled 2024-09-20: qty 280, 14d supply, fill #1
  Filled 2024-10-02: qty 280, 14d supply, fill #2
  Filled 2024-10-24: qty 280, 14d supply, fill #3
  Filled 2024-11-27: qty 280, 14d supply, fill #4
  Filled ????-??-??: fill #0

## 2024-09-05 ENCOUNTER — Other Ambulatory Visit: Payer: Self-pay

## 2024-09-05 ENCOUNTER — Other Ambulatory Visit (HOSPITAL_COMMUNITY): Payer: Self-pay

## 2024-09-05 ENCOUNTER — Encounter: Payer: Self-pay | Admitting: Oncology

## 2024-09-05 ENCOUNTER — Ambulatory Visit
Admission: RE | Admit: 2024-09-05 | Discharge: 2024-09-05 | Disposition: A | Discharge status: Home / Self Care | Source: Ambulatory Visit | Attending: Radiation Oncology | Admitting: Radiation Oncology

## 2024-09-05 ENCOUNTER — Inpatient Hospital Stay

## 2024-09-05 VITALS — BP 121/67 | HR 62 | Temp 98.3°F | Resp 14 | Wt 156.5 lb

## 2024-09-05 DIAGNOSIS — Z510 Encounter for antineoplastic radiation therapy: Secondary | ICD-10-CM | POA: Diagnosis not present

## 2024-09-05 DIAGNOSIS — E538 Deficiency of other specified B group vitamins: Secondary | ICD-10-CM

## 2024-09-05 LAB — RAD ONC ARIA SESSION SUMMARY
Course Elapsed Days: 34
Plan Fractions Treated to Date: 25
Plan Prescribed Dose Per Fraction: 2 Gy
Plan Total Fractions Prescribed: 33
Plan Total Prescribed Dose: 66 Gy
Reference Point Dosage Given to Date: 50 Gy
Reference Point Session Dosage Given: 2 Gy
Session Number: 25

## 2024-09-05 MED ORDER — SODIUM CHLORIDE 0.9 % IV SOLN: Freq: Once | INTRAVENOUS | Status: AC

## 2024-09-05 MED ORDER — ONDANSETRON HCL 4 MG/2ML IJ SOLN: 8.0000 mg | Freq: Once | INTRAMUSCULAR | Status: DC

## 2024-09-05 NOTE — Patient Instructions (Signed)
 Rehydration, Older Adult  Rehydration is the replacement of fluids, salts, and minerals in the body (electrolytes) that are lost during dehydration. Dehydration is when there is not enough water or other fluids in the body. This happens when you lose more fluids than you take in. People who are age 73 or older have a higher risk of dehydration than younger adults. This is because in older age, the body: Is less able to maintain the right amount of water. Does not respond to temperature changes as well. Does not get a sense of thirst as easily or quickly. Other causes include: Not drinking enough fluids. This can occur when you are ill, when you forget to drink, or when you are doing activities that require a lot of energy, especially in hot weather. Conditions that cause loss of water or other fluids. These include diarrhea, vomiting, sweating, or urinating a lot. Other illnesses, such as fever or infection. Certain medicines, such as those that remove excess fluid from the body (diuretics). Symptoms of mild or moderate dehydration may include thirst, dry lips and mouth, and dizziness. Symptoms of severe dehydration may include increased heart rate, confusion, fainting, and not urinating. In severe cases, you may need to get fluids through an IV at the hospital. For mild or moderate cases, you can usually rehydrate at home by drinking certain fluids as told by your health care provider. What are the risks? Rehydration is usually safe. Taking in too much fluid (overhydration) can be a problem but is rare. Overhydration can cause an imbalance of electrolytes in the body, kidney failure, fluid in the lungs, or a decrease in salt (sodium) levels in the body. Supplies needed: You will need an oral rehydration solution (ORS) if your health care provider tells you to use one. This is a drink to treat dehydration. It can be found in pharmacies and retail stores. How to rehydrate Fluids Follow  instructions from your health care provider about what to drink. The kind of fluid and the amount you should drink depend on your condition. In general, you should choose drinks that you prefer. If told by your health care provider, drink an ORS. Make an ORS by following instructions on the package. Start by drinking small amounts, about  cup (120 mL) every 5-10 minutes. Slowly increase how much you drink until you have taken in the amount recommended by your health care provider. Drink enough clear fluids to keep your urine pale yellow. If you were told to drink an ORS, finish it first, then start slowly drinking other clear fluids. Drink fluids such as: Water. This includes sparkling and flavored water. Drinking only water can lead to having too little sodium in your body (hyponatremia). Follow the advice of your health care provider. Water from ice chips you suck on. Fruit juice with water added to it(diluted). Sports drinks. Hot or cold herbal teas. Broth-based soups. Coffee. Milk or milk products. Food Follow instructions from your health care provider about what to eat while you rehydrate. Your health care provider may recommend that you slowly begin eating regular foods in small amounts. Eat foods that contain a healthy balance of electrolytes, such as bananas, oranges, potatoes, tomatoes, and spinach. Avoid foods that are greasy or contain a lot of sugar. In some cases, you may get nutrition through a feeding tube that is passed through your nose and into your stomach (nasogastric tube, or NG tube). This may be done if you have uncontrolled vomiting or diarrhea. Drinks to avoid  Certain drinks may make dehydration worse. While you rehydrate, avoid drinking alcohol. How to tell if you are recovering from dehydration You may be getting better if: You are urinating more often than before you started rehydrating. Your urine is pale yellow. Your energy level improves. You vomit less  often. You have diarrhea less often. Your appetite improves or returns to normal. You feel less dizzy or light-headed. Your skin tone and color start to look more normal. Follow these instructions at home: Take over-the-counter and prescription medicines only as told by your health care provider. Do not take sodium tablets. Doing this can lead to having too much sodium in your body (hypernatremia). Contact a health care provider if: You continue to have symptoms of mild or moderate dehydration, such as: Thirst. Dry lips. Slightly dry mouth. Dizziness. Dark urine or less urine than usual. Muscle cramps. You continue to vomit or have diarrhea. Get help right away if: You have symptoms of dehydration that get worse. You have a fever. You have a severe headache. You have been vomiting and have problems, such as: Your vomiting gets worse. Your vomit includes blood or green matter (bile). You cannot eat or drink without vomiting. You have problems with urination or bowel movements, such as: Diarrhea that gets worse. Blood in your stool (feces). This may cause stool to look black and tarry. Not urinating, or urinating only a small amount of very dark urine, within 6-8 hours. You have trouble breathing. You have symptoms that get worse with treatment. These symptoms may be an emergency. Get help right away. Call 911. Do not wait to see if the symptoms will go away. Do not drive yourself to the hospital. This information is not intended to replace advice given to you by your health care provider. Make sure you discuss any questions you have with your health care provider. Document Revised: 02/25/2022 Document Reviewed: 02/23/2022 Elsevier Patient Education  2024 [employer]  Constipation, Adult  Constipation is when a person has trouble pooping (having a bowel movement). When you have this condition, you may poop fewer than 3 times a week. Your poop (stool) may also be dry, hard, or  bigger than normal. Follow these instructions at home: Eating and drinking  Eat foods that have a lot of fiber, such as: Fresh fruits and vegetables. Whole grains. Beans. Eat less of foods that are low in fiber and high in fat and sugar, such as: French fries. Hamburgers. Cookies. Candy. Soda. Drink enough fluid to keep your pee (urine) pale yellow. General instructions Exercise regularly or as told by your doctor. Try to do 150 minutes of exercise each week. Go to the restroom when you feel like you need to poop. Do not hold it in. Take over-the-counter and prescription medicines only as told by your doctor. These include any fiber supplements. When you poop: Do deep breathing while relaxing your lower belly (abdomen). Relax your pelvic floor. The pelvic floor is a group of muscles that support the rectum, bladder, and intestines (as well as the uterus in women). Watch your condition for any changes. Tell your doctor if you notice any. Keep all follow-up visits as told by your doctor. This is important. Contact a doctor if: You have pain that gets worse. You have a fever. You have not pooped for 4 days. You vomit. You are not hungry. You lose weight. You are bleeding from the opening of the butt (anus). You have thin, pencil-like poop. Get help right away if:  You have a fever, and your symptoms suddenly get worse. You leak poop or have blood in your poop. Your belly feels hard or bigger than normal (bloated). You have very bad belly pain. You feel dizzy or you faint. Summary Constipation is when a person poops fewer than 3 times a week, has trouble pooping, or has poop that is dry, hard, or bigger than normal. Eat foods that have a lot of fiber. Drink enough fluid to keep your pee (urine) pale yellow. Take over-the-counter and prescription medicines only as told by your doctor. These include any fiber supplements. This information is not intended to replace advice given  to you by your health care provider. Make sure you discuss any questions you have with your health care provider. Document Revised: 08/26/2022 Document Reviewed: 08/26/2022 Elsevier Patient Education  2024 [employer].

## 2024-09-06 ENCOUNTER — Other Ambulatory Visit: Payer: Self-pay

## 2024-09-06 ENCOUNTER — Ambulatory Visit
Admission: RE | Admit: 2024-09-06 | Discharge: 2024-09-06 | Disposition: A | Discharge status: Home / Self Care | Source: Ambulatory Visit | Attending: Radiation Oncology | Admitting: Radiation Oncology

## 2024-09-06 DIAGNOSIS — Z510 Encounter for antineoplastic radiation therapy: Secondary | ICD-10-CM | POA: Diagnosis not present

## 2024-09-06 LAB — RAD ONC ARIA SESSION SUMMARY
Course Elapsed Days: 35
Plan Fractions Treated to Date: 26
Plan Prescribed Dose Per Fraction: 2 Gy
Plan Total Fractions Prescribed: 33
Plan Total Prescribed Dose: 66 Gy
Reference Point Dosage Given to Date: 52 Gy
Reference Point Session Dosage Given: 2 Gy
Session Number: 26

## 2024-09-07 ENCOUNTER — Inpatient Hospital Stay (HOSPITAL_BASED_OUTPATIENT_CLINIC_OR_DEPARTMENT_OTHER): Admitting: Oncology

## 2024-09-07 ENCOUNTER — Inpatient Hospital Stay

## 2024-09-07 ENCOUNTER — Encounter: Payer: Self-pay | Admitting: Oncology

## 2024-09-07 ENCOUNTER — Other Ambulatory Visit: Payer: Self-pay

## 2024-09-07 ENCOUNTER — Ambulatory Visit
Admission: RE | Admit: 2024-09-07 | Discharge: 2024-09-07 | Disposition: A | Discharge status: Home / Self Care | Source: Ambulatory Visit | Attending: Radiation Oncology | Admitting: Radiation Oncology

## 2024-09-07 ENCOUNTER — Other Ambulatory Visit (HOSPITAL_COMMUNITY): Payer: Self-pay

## 2024-09-07 VITALS — BP 112/60 | HR 65 | Temp 98.6°F | Resp 14 | Ht 71.0 in | Wt 159.1 lb

## 2024-09-07 VITALS — BP 101/70 | HR 85 | Temp 98.6°F | Resp 17 | Ht 71.0 in | Wt 159.1 lb

## 2024-09-07 DIAGNOSIS — E538 Deficiency of other specified B group vitamins: Secondary | ICD-10-CM

## 2024-09-07 DIAGNOSIS — C099 Malignant neoplasm of tonsil, unspecified: Secondary | ICD-10-CM

## 2024-09-07 DIAGNOSIS — Z510 Encounter for antineoplastic radiation therapy: Secondary | ICD-10-CM | POA: Diagnosis not present

## 2024-09-07 DIAGNOSIS — D61818 Other pancytopenia: Secondary | ICD-10-CM | POA: Diagnosis not present

## 2024-09-07 LAB — RAD ONC ARIA SESSION SUMMARY
Course Elapsed Days: 36
Plan Fractions Treated to Date: 27
Plan Prescribed Dose Per Fraction: 2 Gy
Plan Total Fractions Prescribed: 33
Plan Total Prescribed Dose: 66 Gy
Reference Point Dosage Given to Date: 54 Gy
Reference Point Session Dosage Given: 2 Gy
Session Number: 27

## 2024-09-07 LAB — CBC WITH DIFFERENTIAL (CANCER CENTER ONLY)
Abs Immature Granulocytes: 0.01 K/uL (ref 0.00–0.07)
Basophils Absolute: 0 K/uL (ref 0.0–0.1)
Basophils Relative: 1 %
Eosinophils Absolute: 0 K/uL (ref 0.0–0.5)
Eosinophils Relative: 1 %
HCT: 35.8 % — ABNORMAL LOW (ref 39.0–52.0)
Hemoglobin: 12.6 g/dL — ABNORMAL LOW (ref 13.0–17.0)
Immature Granulocytes: 1 %
Lymphocytes Relative: 17 %
Lymphs Abs: 0.3 K/uL — ABNORMAL LOW (ref 0.7–4.0)
MCH: 32.5 pg (ref 26.0–34.0)
MCHC: 35.2 g/dL (ref 30.0–36.0)
MCV: 92.3 fL (ref 80.0–100.0)
Monocytes Absolute: 0.2 K/uL (ref 0.1–1.0)
Monocytes Relative: 10 %
Neutro Abs: 1.3 K/uL — ABNORMAL LOW (ref 1.7–7.7)
Neutrophils Relative %: 70 %
Platelet Count: 55 K/uL — ABNORMAL LOW (ref 150–400)
RBC: 3.88 MIL/uL — ABNORMAL LOW (ref 4.22–5.81)
RDW: 12.7 % (ref 11.5–15.5)
WBC Count: 1.8 K/uL — ABNORMAL LOW (ref 4.0–10.5)
nRBC: 0 % (ref 0.0–0.2)

## 2024-09-07 LAB — BASIC METABOLIC PANEL - CANCER CENTER ONLY
Anion gap: 4 — ABNORMAL LOW (ref 5–15)
BUN: 17 mg/dL (ref 8–23)
CO2: 30 mmol/L (ref 22–32)
Calcium: 9.1 mg/dL (ref 8.9–10.3)
Chloride: 101 mmol/L (ref 98–111)
Creatinine: 0.96 mg/dL (ref 0.61–1.24)
GFR, Estimated: >60 mL/min (ref >60)
Glucose, Bld: 112 mg/dL — ABNORMAL HIGH (ref 70–99)
Potassium: 4.2 mmol/L (ref 3.5–5.1)
Sodium: 135 mmol/L (ref 135–145)

## 2024-09-07 LAB — MAGNESIUM: Magnesium: 1.8 mg/dL (ref 1.7–2.4)

## 2024-09-07 MED ORDER — SODIUM CHLORIDE 0.9 % IV SOLN: Freq: Once | INTRAVENOUS | Status: AC

## 2024-09-07 MED FILL — Fosaprepitant Dimeglumine For IV Infusion 150 MG (Base Eq): INTRAVENOUS | Qty: 5 | Status: AC

## 2024-09-07 NOTE — Progress Notes (Signed)
 [facility]  ONCOLOGY CLINIC PROGRESS NOTE   Patient Care Team: Marvene Prentice SAUNDERS, FNP as PCP - General (Family Medicine) Carlie Clark, MD as Consulting Physician (Otolaryngology) Izell Domino, MD as Attending Physician (Radiation Oncology) Daryle Heron Jenkins, RD as Dietitian (Nutrition) Malmfelt, Gregory CROME, RN as Oncology Nurse Navigator [name], MD as Consulting Physician (Oncology) Lauralee Chew, MD as Referring Physician (Otolaryngology)  PATIENT NAME: Gregory Jenkins   MR#: [id number] DOB: [birth year]  Date of visit: 09/07/2024   ASSESSMENT & PLAN:   Gregory Jenkins is a 73 y.o. gentleman with a past medical history of left tonsillar squamous cell carcinoma, managed with concurrent chemoradiation using cetuximab  in 2017, went into remission, was referred to our clinic for recently diagnosed right tonsillar cancer. cT2,cN0,cM0,p16- , Stage II.   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  Gregory Jenkins continued to have follow-ups up until 2022 with no evidence of disease clinically and on imaging.  Previously treated by Dr. Lonn in our clinic.  Now with a recurrence on the right side, p16 negative.  No evidence of metastatic disease on PET scan.  Please review oncology history for additional details and timeline of events.  Gregory Jenkins was seen in consultation by Dr. Lauralee (surgical ENT) on 06/28/24 to discuss surgical treatment options. Although surgery would likely provide him with the best outcome, Dr. 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 In this setting, Dr. Lauralee would like him to be considered for re-irradiation.   His case was discussed in our ENT tumor conference on 07/12/2024.  Consensus opinion is to proceed with concurrent chemoradiation with  cisplatin.  On 07/17/2024, MRI of the neck showed nodular thickening and enhancement of the right palatine tonsil extending to the soft palate, base of the uvula, and lateral/posterolateral oropharyngeal mucosa, compatible with oropharyngeal malignancy. 7 mm nodule in the superficial lobe of the right parotid corresponding to prior PET uptake, suspicious for primary parotid neoplasm or metastatic lymph node.  On 07/24/2024, core biopsy of the right parotid lesion showed bland oncocytic lesion, benign finding.  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   Plan made to proceed with concurrent chemoradiation using weekly cisplatin.  Started radiation treatments from 08/02/2024.  Received cycle 1 of cisplatin on 08/04/2024.   Gregory Jenkins has been tolerating treatments reasonably well with some expected side effects like mucositis, fatigue.  Gregory Jenkins is due for cycle # 6 of cisplatin tomorrow.  Labs today however showed leukopenia with a white count of 1800, ANC 1300, platelet count decreased at 55,000.  Hemoglobin 12.6.  We will dose reduce cisplatin by 50% and proceed with 20 mg/m dose tomorrow.  Submitted request for Zarxio approval.  If approved, we will plan for 300 mcg daily for 3 days next week.  We will arrange for 1 L of NS bolus at least twice a week on nontreatment days.  This week Gregory Jenkins will receive IV fluids on Tuesday, 11/18 and on Thursday, 11/20.  - Monitor kidney function and blood counts weekly - Ensure hydration with 70-80 ounces of fluid daily - Prescribed antiemetics for nausea management  Gregory Jenkins is maintaining nutrition without need for feeding tube.  I will see him in 1 week for follow-up with repeat labs and continuation of chemotherapy.  If platelet count is below 50,000 or ANC below thousand,  we will omit cycle 7 of chemotherapy.  Chemotherapy-induced thrombocytopenia Platelet count is 55,000, below the threshold for full-strength chemotherapy.   - chemotherapy  dose is reduced to prevent further drop. - Administered half-strength chemotherapy to minimize impact on platelet count. - Monitor platelet levels closely.  Chemotherapy-induced leukopenia White blood cell count is 1,800, down from 4,700, with neutrophil count at 1,300. Chemotherapy dose is reduced to prevent further leukopenia. - Administer half-strength chemotherapy tomorrow to minimize impact on white blood cell count. - Checked with insurance for potential approval for Zarxio.  If approved, plan to give 300 mcg daily for 3 days next week - Monitor white blood cell and neutrophil counts closely.  Oral mucositis secondary to chemoradiation Oral mucositis present, managed with magic mouthwash containing lidocaine , providing relief before meals. - Continue using magic mouthwash 30 minutes before meals.  Dehydration requiring intravenous fluid support Requiring intravenous fluid support to manage dehydration. Fluids administered today.  Additional fluids planned for Tuesday next week to coincide with radiation therapy. - Administered intravenous fluids today. - Scheduled intravenous fluids for Tuesday and Thursday next week.  Vitamin B12 deficiency Vitamin B12 deficiency managed with monthly B12 injections. Coordination with oncology clinic to administer B12 injections during treatment visits to reduce additional appointments. - Administer B12 injection at oncology clinic starting in October - Coordinate with family doctor to transition B12 administration to oncology clinic    I reviewed lab results and outside records for this visit and discussed relevant results with the patient. Diagnosis, plan of care and treatment options were also discussed in detail with the patient. Opportunity provided to ask questions and answers provided to his apparent satisfaction. Provided instructions to call our clinic with any problems, questions or concerns prior to return visit. I recommended to continue  follow-up with PCP and sub-specialists. Gregory Jenkins verbalized understanding and agreed with the plan.   NCCN guidelines have been consulted in the planning of this patient's care.  I spent a total of 42 minutes during this encounter with the patient including review of chart and various tests results, discussions about plan of care and coordination of care plan.   [name], MD  09/07/2024 10:54 AM  [facility] - [facility] [address] [place] [address] Dept: [phone] Dept Fax: [phone]    CHIEF COMPLAINT/ REASON FOR VISIT:   Recently diagnosed right tonsillar cancer. cT2,cN0,cM0,p16- , Stage II. Has history of left-sided tonsillar cancer treated in 2017 with chemoradiation using cetuximab .   Current Treatment: Started concurrent chemoradiation with weekly cisplatin from 08/02/2024.  INTERVAL HISTORY:    Discussed the use of AI scribe software for clinical note transcription with the patient, who gave verbal consent to proceed.  History of Present Illness  Gregory Jenkins is a 73 year old male undergoing chemotherapy for cancer who presents for evaluation of low blood counts and consideration of chemotherapy dosing.  Gregory Jenkins is currently undergoing chemotherapy and radiation therapy for cancer. Gregory Jenkins is experiencing low blood counts, with a platelet count of 55,000 and a white blood cell count of 1,800, which have decreased from previous levels. The white blood cell count has dropped from 4,700, attributed to the effects of chemotherapy.  Gregory Jenkins has completed five chemotherapy sessions. There is concern about maintaining blood counts, and a booster injection may be considered if the neutrophil count drops below 1,000, although it is currently at 1,300.  His kidney function is  stable, and Gregory Jenkins is maintaining good hydration with the help of fluids. Gregory Jenkins is using a 'magic mouthwash' containing lidocaine  to  manage oral thrush, which is helping him eat more comfortably. Gregory Jenkins recently ate a bacon, egg, and cheese sandwich, indicating some improvement in his ability to eat.   I have reviewed the past medical history, past surgical history, social history and family history with the patient and they are unchanged from previous note.  HISTORY OF PRESENT ILLNESS:   ONCOLOGY HISTORY:   Initial diagnosis of left tonsillar cancer in 2017 for which Gregory Jenkins received radiation therapy to the left tonsil and involved bilateral cervical lymph nodes. This was concurrent with systemic therapy consisting of cetuximab . Gregory Jenkins was last seen by Rad Onc for a follow-up visit on 10/02/2016 and was NED on examination and imaging at that time.    Gregory Jenkins continued to follow with Dr. Carlie (ENT) and with Dr. Lonn under surveillance until 2022. Gregory Jenkins did have follow-up imaging performed during that interval of time which consisted of a follow-up neck CT on 02/13/2019 that showed NED and stable post treatment changes in the neck.    Gregory Jenkins did however return to Dr. Ilona office on 02/24/22 for evaluation of a spot on the back of his tongue. However, oral exam performed at that time did not show any abnormal findings to correlate with the reported concern.    Gregory Jenkins presented to Dr. Ilona office on 06/07/24 with a 3 month history of a sore throat which Gregory Jenkins characterized as feeling raw on the right side of his throat (with occasional extension to the roof of his mouth), as well as ear pressure. Oral exam performed at that time noted an exudative mass of the right tonsillar fossa. A biopsy of the right tonsillar mass was accordingly obtained at that time that showed findings consistent with invasive squamous cell carcinoma (moderately differentiated); p16 negative.    Soft tissue neck CT with contrast on 06/19/24 demonstrated: the right palatine tonsillar mass extending onto the soft palate and into the right glossotonsillar sulcus, consistent with  SCC, measuring at least 26 mm. No abnormal lymph nodes were demonstrated in the neck. A chest CT was also performed that same day which showed several indeterminate punctate pulmonary nodules, but no definite evidence of pulmonary metastatic disease.    Gregory Jenkins was seen in consultation by Dr. Lauralee (surgical ENT) on 06/28/24 to discuss surgical treatment options. Although surgery would likely provide him with the best outcome, Dr. 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 In this setting, Dr. Lauralee would like him to be considered for re-irradiation. Gregory Jenkins has also been referred back to medical oncology.   PET scan performed on 07/04/24 demonstrated: the hypermetabolic lesion in the vicinity of the right palatine tonsil consistent with malignancy, and with potential extension to the uvula, and a hypermetabolic nodule in the upper right parotid gland with potential differential considerations including primary parotid neoplasm vs a metastatic lymph node noted. Imaging otherwise showed no evidence of metastatic disease to the chest, abdomen, or pelvis. Other findings of potential clinical significance included a bladder calculus and prostatomegaly with a prominent median lobe indenting the bladder base.    His case was discussed in tumor conference on 07/12/2024.  Plan made to proceed with MRI of the neck for further evaluation and delineation of the tonsil mass.  Also plan for biopsy of the parotid lesion.  On 07/17/2024, MRI of the  neck showed nodular thickening and enhancement of the right palatine tonsil extending to the soft palate, base of the uvula, and lateral/posterolateral oropharyngeal mucosa, compatible with oropharyngeal malignancy. 7 mm nodule in the superficial lobe of the right parotid corresponding to prior PET uptake, suspicious for primary parotid neoplasm or metastatic lymph node.  On 07/24/2024, core biopsy of the  right parotid lesion showed bland oncocytic lesion, benign finding.  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   Plan made to proceed with concurrent chemoradiation using weekly cisplatin.  Started this from 08/02/2024.   Oncology History  Malignant neoplasm of tonsillar fossa (HCC)  11/12/2015 Procedure   Accession: [id number] FNA of left neck LN positive for squamous cell cancer   11/18/2015 Imaging   Left greater than right cervical lymphadenopathy suspicious for nodal metastatic disease.2. Mild asymmetry of the left tonsil -- correlate with direct visualization to assess for primary neoplasm.   11/20/2015 Procedure   Gregory Jenkins has left tonsil biopsy   11/20/2015 Pathology Results   Accession: [id number] Left tonsil biopsy showed squamous cell cancer, p16 positive.   11/28/2015 PET scan   1. Left palatine tonsil primary with left worse than right cervical nodal metastasis. 2. No extracervical hypermetabolic metastasis.    12/12/2015 Procedure   Peg and port-a-cath placement.   12/18/2015 - 01/29/2016 Chemotherapy   Gregory Jenkins received weekly cetuximab    12/18/2015 - 02/03/2016 Radiation Therapy   Received Helicle IMRT Tomotherapy:  Left tonsil and bilateral neck / 70 Gy in 35 fractions to gross disease, 63 Gy in 35 fractions to high risk nodal echelons, and 56 Gy in 35 fractions to intermediate risk nodal echelons.   12/31/2015 Adverse Reaction   Treatment #3 is placed on hold due to worsening mucositis   01/08/2016 Miscellaneous   Cetuximab  #3 resumed with 50% dose adjustment   01/14/2016 Adverse Reaction   Treatment #4 is placed on hold due to worsening mucositis and failure to thrive   03/20/2016 Imaging   CT abdomen showed mild gallbladder wall thickening with pericholecystic fluid and several gallstones. The gallbladder is nondistended. Recommend clinical correlation for acute or chronic cholecystitis.   04/15/2016 Procedure   PEG removed.   05/26/2016 Imaging    Restaging PET:  Marked interval improvement with resolution of left tonsillar hypermetabolism and right cervical lymph node hypermetabolism. Left cervical lymph nodes have decreased substantially in size and there is a subtle degree of FDG hypermetabolism still associated with these left-sided cervical nodes.   10/12/2016 Procedure   Port-a-cath removed.   02/14/2019 Imaging   CT neck 1. Satisfactory post treatment appearance of the neck. NI-Rads category 1. 2. No mass or abnormality to correspond to the marked area of concern at the level of the right submandibular gland.     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  ALLERGIES: Gregory Jenkins has no known allergies.  MEDICATIONS:  Current Outpatient Medications  Medication Sig Dispense Refill  acetaminophen  (TYLENOL ) 500 MG tablet Take 1,000 mg by mouth every 6 (six) hours as needed for moderate pain.     aspirin  EC 81 MG tablet Take 81 mg by mouth daily.     atorvastatin  (LIPITOR) 10 MG tablet Take 10 mg by mouth daily.     cyanocobalamin (VITAMIN B12) 1000 MCG/ML injection Inject 1,000 mcg into the muscle every 30 (thirty) days.     dexamethasone  (DECADRON ) 4 MG tablet Take 2 tablets (8 mg) by mouth daily x 3 days starting the day after cisplatin chemotherapy. Take with food. 30 tablet 1   ipratropium (ATROVENT) 0.06 % nasal spray Place 2 sprays into both nostrils daily as needed for rhinitis.     levothyroxine  (SYNTHROID ) 125 MCG  tablet Take 125 mcg by mouth daily before breakfast.  3   lidocaine  (XYLOCAINE ) 2 % solution Patient: Mix 1part 2% viscous lidocaine , 1part H20. Swish & swallow 10mL of diluted mixture, 30min before meals and at bedtime, up to QID 200 mL 3   lidocaine -prilocaine  (EMLA ) cream Apply to affected area once 30 g 3   magic mouthwash (nystatin, lidocaine , diphenhydrAMINE , alum & mag hydroxide) suspension Swish and spit 5 mLs 4 (four) times daily as needed for mouth pain. Can swish and swallow if needed 360 mL 3   mirtazapine (REMERON) 7.5 MG tablet Take 1 tablet (7.5 mg total) by mouth at bedtime. 30 tablet 1   ondansetron  (ZOFRAN ) 8 MG tablet Take 1 tablet (8 mg total) by mouth every 8 (eight) hours as needed for nausea or vomiting. Start on the third day after cisplatin. 30 tablet 1   prochlorperazine  (COMPAZINE ) 10 MG tablet Take 1 tablet (10 mg total) by mouth every 6 (six) hours as needed (Nausea or vomiting). 30 tablet 1   No current facility-administered medications for this visit.     VITALS:   Blood pressure 101/70, pulse 85, temperature 98.6 F (37 C), temperature source Temporal, resp. rate 17, height 5' 11 (1.803 m), weight 159 lb 1.6 oz (72.2 kg), SpO2 100%.  Wt Readings from Last 3 Encounters:  09/07/24 159 lb 1.6 oz (72.2 kg)  09/07/24 159 lb 1.6 oz (72.2 kg)  09/05/24 156 lb 8 oz (71 kg)    Body mass index is 22.19 kg/m.    Onc Performance Status - 09/07/24 0900       ECOG Perf Status   ECOG Perf Status Restricted in physically strenuous activity but ambulatory and able to carry out work of a light or sedentary nature, e.g., light house work, office work      KPS SCALE   KPS % SCORE Cares for self, unable to carry on normal activity or to do active work           PHYSICAL EXAM:   Physical Exam Constitutional:      General: Gregory Jenkins is not in acute distress.    Appearance: Normal appearance.  HENT:     Head: Normocephalic and atraumatic.  Eyes:      Conjunctiva/sclera: Conjunctivae normal.  Neck:     Comments: Post radiation therapy fibrosis noted in the neck area, well-healed.   No definite palpable cervical lymphadenopathy Cardiovascular:     Rate and Rhythm: Normal rate and regular rhythm.  Pulmonary:     Effort: Pulmonary effort is normal. No respiratory distress.  Abdominal:     General: There is no distension.  Neurological:     General: No focal deficit present.     Mental Status: Gregory Jenkins is  alert and oriented to person, place, and time.  Psychiatric:        Mood and Affect: Mood normal.        Behavior: Behavior normal.      LABORATORY DATA:   I have reviewed the data as listed.  Results for orders placed or performed in visit on 09/07/24  Magnesium  Result Value Ref Range   Magnesium 1.8 1.7 - 2.4 mg/dL  Basic Metabolic Panel - Cancer Center Only  Result Value Ref Range   Sodium 135 135 - 145 mmol/L   Potassium 4.2 3.5 - 5.1 mmol/L   Chloride 101 98 - 111 mmol/L   CO2 30 22 - 32 mmol/L   Glucose, Bld 112 (H) 70 - 99 mg/dL   BUN 17 8 - 23 mg/dL   Creatinine 9.03 9.38 - 1.24 mg/dL   Calcium  9.1 8.9 - 10.3 mg/dL   GFR, Estimated >39 >39 mL/min   Anion gap 4 (L) 5 - 15  CBC with Differential (Cancer Center Only)  Result Value Ref Range   WBC Count 1.8 (L) 4.0 - 10.5 K/uL   RBC 3.88 (L) 4.22 - 5.81 MIL/uL   Hemoglobin 12.6 (L) 13.0 - 17.0 g/dL   HCT 64.1 (L) 60.9 - 47.9 %   MCV 92.3 80.0 - 100.0 fL   MCH 32.5 26.0 - 34.0 pg   MCHC 35.2 30.0 - 36.0 g/dL   RDW 87.2 88.4 - 84.4 %   Platelet Count 55 (L) 150 - 400 K/uL   nRBC 0.0 0.0 - 0.2 %   Neutrophils Relative % 70 %   Neutro Abs 1.3 (L) 1.7 - 7.7 K/uL   Lymphocytes Relative 17 %   Lymphs Abs 0.3 (L) 0.7 - 4.0 K/uL   Monocytes Relative 10 %   Monocytes Absolute 0.2 0.1 - 1.0 K/uL   Eosinophils Relative 1 %   Eosinophils Absolute 0.0 0.0 - 0.5 K/uL   Basophils Relative 1 %   Basophils Absolute 0.0 0.0 - 0.1 K/uL   Immature Granulocytes 1 %   Abs  Immature Granulocytes 0.01 0.00 - 0.07 K/uL     RADIOGRAPHIC STUDIES:  No recent pertinent imaging available to review.   CODE STATUS:  Code Status History     Date Active Date Inactive Code Status Order ID Comments User Context   07/19/2024 1643 07/20/2024 0515 Full Code [id number]  Hughes Simmonds, MD [facility]   06/20/2023 1423 06/23/2023 1934 Full Code [id number]  Waddell Rake, MD ED   05/31/2023 1005 06/02/2023 0101 Full Code [id number]  Magda Debby SAILOR, MD Inpatient    Questions for Most Recent Historical Code Status (Order [id number])     Question Answer   By: Consent: discussion documented in EHR            No orders of the defined types were placed in this encounter.    Future Appointments  Date Time Provider Department Center  09/07/2024 11:15 AM [facility] LINAC 3 [facility] None  09/08/2024  7:30 AM [facility] LINAC 3 [facility] None  09/08/2024  8:00 AM [facility] INFUSION [facility] None  09/11/2024 11:15 AM [facility] LIN[facility] 3 [facility] None  09/11/2024 11:30 AM LINAC-SQUIRE [facility] None  09/12/2024  8:00 AM [facility] INFUSION [facility] None  09/12/2024 11:15 AM [facility] LINAC 3 [facility] None  09/13/2024 11:15 AM [facility] LIN[facility] 4 [facility] None  09/14/2024  8:45 AM [facility]NAG[facility] 2 [facility] None  09/14/2024  9:45 AM Rontavious Albright, MD [facility] None  09/14/2024 11:15 AM [facility] LINAC  4 [facility] None  09/15/2024  7:30 AM [facility] LINAC 4 [facility] None  09/15/2024  8:00 AM [facility] INFUSION [facility] None  10/10/2024 10:00 AM Breedlove Blue, Blaire L, PT [facility] None  10/10/2024 11:00 AM Schinke, Lupita NOVAK, CCC-SLP [facility]     This document was completed utilizing speech recognition software. Grammatical errors, random word insertions, pronoun errors, and incomplete sentences are an occasional consequence of this system due to software limitations, ambient noise, and hardware issues. Any formal questions  or concerns about the content, text or information contained within the body of this dictation should be directly addressed to the provider for clarification.

## 2024-09-07 NOTE — Patient Instructions (Signed)

## 2024-09-07 NOTE — Assessment & Plan Note (Addendum)
 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  He continued to have follow-ups up until 2022 with no evidence of disease clinically and on imaging.  Previously treated by Dr. Lonn in our clinic.  Now with a recurrence on the right side, p16 negative.  No evidence of metastatic disease on PET scan.  Please review oncology history for additional details and timeline of events.  He was seen in consultation by Dr. Lauralee (surgical ENT) on 06/28/24 to discuss surgical treatment options. Although surgery would likely provide him with the best outcome, Dr. 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 In this setting, Dr. Lauralee would like him to be considered for re-irradiation.   His case was discussed in our ENT tumor conference on 07/12/2024.  Consensus opinion is to proceed with concurrent chemoradiation with cisplatin.  On 07/17/2024, MRI of the neck showed nodular thickening and enhancement of the right palatine tonsil extending to the soft palate, base of the uvula, and lateral/posterolateral oropharyngeal mucosa, compatible with oropharyngeal malignancy. 7 mm nodule in the superficial lobe of the right parotid corresponding to prior PET uptake, suspicious for primary parotid neoplasm or metastatic lymph node.  On 07/24/2024, core biopsy of the right parotid lesion showed bland oncocytic lesion, benign finding.  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   Plan made to proceed with concurrent chemoradiation using weekly cisplatin.  Started radiation treatments from 08/02/2024.  Received cycle 1 of cisplatin on 08/04/2024.   He has been tolerating treatments reasonably well with some expected side effects like mucositis, fatigue.  He is due for cycle # 6 of cisplatin tomorrow.  Labs today however showed leukopenia with a  white count of 1800, ANC 1300, platelet count decreased at 55,000.  Hemoglobin 12.6.  We will dose reduce cisplatin by 50% and proceed with 20 mg/m dose tomorrow.  Submitted request for Zarxio approval.  If approved, we will plan for 300 mcg daily for 3 days next week.  We will arrange for 1 L of NS bolus at least twice a week on nontreatment days.  This week he will receive IV fluids on Tuesday, 11/18 and on Thursday, 11/20.  - Monitor kidney function and blood counts weekly - Ensure hydration with 70-80 ounces of fluid daily - Prescribed antiemetics for nausea management  He is maintaining nutrition without need for feeding tube.  I will see him in 1 week for follow-up with repeat labs and continuation of chemotherapy.  If platelet count is below 50,000 or ANC below thousand, we will omit cycle 7 of chemotherapy.

## 2024-09-08 ENCOUNTER — Inpatient Hospital Stay

## 2024-09-08 ENCOUNTER — Ambulatory Visit
Admission: RE | Admit: 2024-09-08 | Discharge: 2024-09-08 | Disposition: A | Discharge status: Home / Self Care | Source: Ambulatory Visit | Attending: Radiation Oncology | Admitting: Radiation Oncology

## 2024-09-08 ENCOUNTER — Other Ambulatory Visit: Payer: Self-pay

## 2024-09-08 VITALS — BP 114/65 | HR 83 | Temp 98.8°F | Resp 16

## 2024-09-08 DIAGNOSIS — Z510 Encounter for antineoplastic radiation therapy: Secondary | ICD-10-CM | POA: Diagnosis not present

## 2024-09-08 DIAGNOSIS — C099 Malignant neoplasm of tonsil, unspecified: Secondary | ICD-10-CM

## 2024-09-08 LAB — RAD ONC ARIA SESSION SUMMARY
Course Elapsed Days: 37
Plan Fractions Treated to Date: 28
Plan Prescribed Dose Per Fraction: 2 Gy
Plan Total Fractions Prescribed: 33
Plan Total Prescribed Dose: 66 Gy
Reference Point Dosage Given to Date: 56 Gy
Reference Point Session Dosage Given: 2 Gy
Session Number: 28

## 2024-09-08 MED ORDER — SODIUM CHLORIDE 0.9 % IV SOLN
20.0000 mg/m2 | Freq: Once | INTRAVENOUS | Status: AC
  Administered 2024-09-08: 39 mg via INTRAVENOUS
  Filled 2024-09-08: qty 39

## 2024-09-08 MED ORDER — PALONOSETRON HCL INJECTION 0.25 MG/5ML
0.2500 mg | Freq: Once | INTRAVENOUS | Status: AC
  Administered 2024-09-08: 0.25 mg via INTRAVENOUS
  Filled 2024-09-08: qty 5

## 2024-09-08 MED ORDER — SODIUM CHLORIDE 0.9 % IV SOLN: INTRAVENOUS | Status: DC

## 2024-09-08 MED ORDER — DEXAMETHASONE SOD PHOSPHATE PF 10 MG/ML IJ SOLN
10.0000 mg | Freq: Once | INTRAMUSCULAR | Status: AC
  Administered 2024-09-08: 10 mg via INTRAVENOUS

## 2024-09-08 MED ORDER — MAGNESIUM SULFATE 2 GM/50ML IV SOLN
2.0000 g | Freq: Once | INTRAVENOUS | Status: AC
  Administered 2024-09-08: 2 g via INTRAVENOUS
  Filled 2024-09-08: qty 50

## 2024-09-08 MED ORDER — SODIUM CHLORIDE 0.9 % IV SOLN
150.0000 mg | Freq: Once | INTRAVENOUS | Status: AC
  Administered 2024-09-08: 150 mg via INTRAVENOUS
  Filled 2024-09-08: qty 150

## 2024-09-08 MED ORDER — POTASSIUM CHLORIDE IN NACL 20-0.9 MEQ/L-% IV SOLN
Freq: Once | INTRAVENOUS | Status: AC
  Filled 2024-09-08: qty 1000

## 2024-09-08 NOTE — Progress Notes (Signed)
 Per Dr. Autumn - okay to proceed with cisplatin without urine output of 200 ml.

## 2024-09-08 NOTE — Patient Instructions (Signed)

## 2024-09-11 ENCOUNTER — Other Ambulatory Visit: Payer: Self-pay | Admitting: Radiation Oncology

## 2024-09-11 ENCOUNTER — Other Ambulatory Visit: Payer: Self-pay

## 2024-09-11 ENCOUNTER — Ambulatory Visit
Admission: RE | Admit: 2024-09-11 | Discharge: 2024-09-11 | Disposition: A | Discharge status: Home / Self Care | Source: Ambulatory Visit | Attending: Radiation Oncology | Admitting: Radiation Oncology

## 2024-09-11 DIAGNOSIS — C099 Malignant neoplasm of tonsil, unspecified: Secondary | ICD-10-CM

## 2024-09-11 DIAGNOSIS — Z510 Encounter for antineoplastic radiation therapy: Secondary | ICD-10-CM | POA: Diagnosis not present

## 2024-09-11 LAB — RAD ONC ARIA SESSION SUMMARY
Course Elapsed Days: 40
Plan Fractions Treated to Date: 29
Plan Prescribed Dose Per Fraction: 2 Gy
Plan Total Fractions Prescribed: 33
Plan Total Prescribed Dose: 66 Gy
Reference Point Dosage Given to Date: 58 Gy
Reference Point Session Dosage Given: 2 Gy
Session Number: 29

## 2024-09-11 MED ORDER — FLUCONAZOLE 100 MG PO TABS: ORAL_TABLET | ORAL | 0 refills | Status: DC

## 2024-09-12 ENCOUNTER — Inpatient Hospital Stay

## 2024-09-12 ENCOUNTER — Ambulatory Visit
Admission: RE | Admit: 2024-09-12 | Discharge: 2024-09-12 | Disposition: A | Discharge status: Home / Self Care | Source: Ambulatory Visit | Attending: Radiation Oncology | Admitting: Radiation Oncology

## 2024-09-12 ENCOUNTER — Other Ambulatory Visit: Payer: Self-pay

## 2024-09-12 VITALS — BP 117/64 | HR 68 | Temp 98.4°F | Resp 12 | Wt 156.8 lb

## 2024-09-12 DIAGNOSIS — E538 Deficiency of other specified B group vitamins: Secondary | ICD-10-CM

## 2024-09-12 DIAGNOSIS — D61818 Other pancytopenia: Secondary | ICD-10-CM

## 2024-09-12 DIAGNOSIS — Z510 Encounter for antineoplastic radiation therapy: Secondary | ICD-10-CM | POA: Diagnosis not present

## 2024-09-12 LAB — BASIC METABOLIC PANEL - CANCER CENTER ONLY
Anion gap: 6 (ref 5–15)
BUN: 23 mg/dL (ref 8–23)
CO2: 29 mmol/L (ref 22–32)
Calcium: 9.4 mg/dL (ref 8.9–10.3)
Chloride: 99 mmol/L (ref 98–111)
Creatinine: 0.8 mg/dL (ref 0.61–1.24)
GFR, Estimated: >60 mL/min (ref >60)
Glucose, Bld: 93 mg/dL (ref 70–99)
Potassium: 4.3 mmol/L (ref 3.5–5.1)
Sodium: 134 mmol/L — ABNORMAL LOW (ref 135–145)

## 2024-09-12 LAB — CBC WITH DIFFERENTIAL (CANCER CENTER ONLY)
Abs Immature Granulocytes: 0.01 K/uL (ref 0.00–0.07)
Basophils Absolute: 0 K/uL (ref 0.0–0.1)
Basophils Relative: 0 %
Eosinophils Absolute: 0 K/uL (ref 0.0–0.5)
Eosinophils Relative: 0 %
HCT: 33.1 % — ABNORMAL LOW (ref 39.0–52.0)
Hemoglobin: 11.7 g/dL — ABNORMAL LOW (ref 13.0–17.0)
Immature Granulocytes: 1 %
Lymphocytes Relative: 25 %
Lymphs Abs: 0.4 K/uL — ABNORMAL LOW (ref 0.7–4.0)
MCH: 32.4 pg (ref 26.0–34.0)
MCHC: 35.3 g/dL (ref 30.0–36.0)
MCV: 91.7 fL (ref 80.0–100.0)
Monocytes Absolute: 0.2 K/uL (ref 0.1–1.0)
Monocytes Relative: 13 %
Neutro Abs: 0.9 K/uL — ABNORMAL LOW (ref 1.7–7.7)
Neutrophils Relative %: 61 %
Platelet Count: 68 K/uL — ABNORMAL LOW (ref 150–400)
RBC: 3.61 MIL/uL — ABNORMAL LOW (ref 4.22–5.81)
RDW: 13.2 % (ref 11.5–15.5)
WBC Count: 1.5 K/uL — ABNORMAL LOW (ref 4.0–10.5)
nRBC: 0 % (ref 0.0–0.2)

## 2024-09-12 LAB — RAD ONC ARIA SESSION SUMMARY
Course Elapsed Days: 41
Plan Fractions Treated to Date: 30
Plan Prescribed Dose Per Fraction: 2 Gy
Plan Total Fractions Prescribed: 33
Plan Total Prescribed Dose: 66 Gy
Reference Point Dosage Given to Date: 60 Gy
Reference Point Session Dosage Given: 2 Gy
Session Number: 30

## 2024-09-12 LAB — MAGNESIUM: Magnesium: 1.8 mg/dL (ref 1.7–2.4)

## 2024-09-12 MED ORDER — FILGRASTIM-SNDZ 300 MCG/0.5ML IJ SOSY
300.0000 ug | PREFILLED_SYRINGE | Freq: Once | INTRAMUSCULAR | Status: AC
  Administered 2024-09-12: 300 ug via SUBCUTANEOUS
  Filled 2024-09-12: qty 0.5

## 2024-09-12 MED ORDER — SODIUM CHLORIDE 0.9 % IV SOLN: Freq: Once | INTRAVENOUS | Status: AC

## 2024-09-12 NOTE — Patient Instructions (Signed)
 Filgrastim Injection What is this medication? FILGRASTIM (fil GRA stim) lowers the risk of infection in people who are receiving chemotherapy. It works by Systems analyst make more white blood cells, which protects your body from infection. It may also be used to help people who have been exposed to high doses of radiation. It can be used to help prepare your body before a stem cell transplant. It works by helping your bone marrow make and release stem cells into the blood. This medicine may be used for other purposes; ask your health care provider or pharmacist if you have questions. COMMON BRAND NAME(S): Neupogen, Nivestym, Nypozi, Releuko, Zarxio What should I tell my care team before I take this medication? They need to know if you have any of these conditions: History of blood diseases, such as sickle cell anemia Kidney disease Recent or ongoing radiation An unusual or allergic reaction to filgrastim, pegfilgrastim, latex, rubber, other medications, foods, dyes, or preservatives Pregnant or trying to get pregnant Breast-feeding How should I use this medication? This medication is injected under the skin or into a vein. It is usually given by your care team in a hospital or clinic setting. It may be given at home. If you get this medication at home, you will be taught how to prepare and give it. Use exactly as directed. Take it as directed on the prescription label at the same time every day. Keep taking it unless your care team tells you to stop. It is important that you put your used needles and syringes in a special sharps container. Do not put them in a trash can. If you do not have a sharps container, call your pharmacist or care team to get one. This medication comes with INSTRUCTIONS FOR USE. Ask your pharmacist for directions on how to use this medication. Read the information carefully. Talk to your pharmacist or care team if you have questions. Talk to your care team about the use of  this medication in children. While it may be prescribed for children for selected conditions, precautions do apply. Overdosage: If you think you have taken too much of this medicine contact a poison control center or emergency room at once. NOTE: This medicine is only for you. Do not share this medicine with others. What if I miss a dose? It is important not to miss any doses. Talk to your care team about what to do if you miss a dose. What may interact with this medication? Medications that may cause a release of neutrophils, such as lithium This list may not describe all possible interactions. Give your health care provider a list of all the medicines, herbs, non-prescription drugs, or dietary supplements you use. Also tell them if you smoke, drink alcohol, or use illegal drugs. Some items may interact with your medicine. What should I watch for while using this medication? Your condition will be monitored carefully while you are receiving this medication. You may need bloodwork while taking this medication. Talk to your care team about your risk of cancer. You may be more at risk for certain types of cancer if you take this medication. What side effects may I notice from receiving this medication? Side effects that you should report to your care team as soon as possible: Allergic reactions--skin rash, itching, hives, swelling of the face, lips, tongue, or throat Capillary leak syndrome--stomach or muscle pain, unusual weakness or fatigue, feeling faint or lightheaded, decrease in the amount of urine, swelling of the ankles, hands,  or feet, trouble breathing High white blood cell level--fever, fatigue, trouble breathing, night sweats, change in vision, weight loss Inflammation of the aorta--fever, fatigue, back, chest, or stomach pain, severe headache Kidney injury (glomerulonephritis)--decrease in the amount of urine, red or dark brown urine, foamy or bubbly urine, swelling of the ankles, hands,  or feet Shortness of breath or trouble breathing Spleen injury--pain in upper left stomach or shoulder Unusual bruising or bleeding Side effects that usually do not require medical attention (report to your care team if they continue or are bothersome): Back pain Bone pain Fatigue Fever Headache Nausea This list may not describe all possible side effects. Call your doctor for medical advice about side effects. You may report side effects to FDA at 1-800-FDA-1088. Where should I keep my medication? Keep out of the reach of children and pets. Keep this medication in the original packaging until you are ready to take it. Protect from light. See product for storage information. Each product may have different instructions. Get rid of any unused medication after the expiration date. To get rid of medications that are no longer needed or have expired: Take the medication to a medications take-back program. Check with your pharmacy or law enforcement to find a location. If you cannot return the medication, ask your pharmacist or care team how to get rid of this medication safely. NOTE: This sheet is a summary. It may not cover all possible information. If you have questions about this medicine, talk to your doctor, pharmacist, or health care provider.  2024 [employer] Standard (2022-03-05 00:00:00)

## 2024-09-13 ENCOUNTER — Inpatient Hospital Stay

## 2024-09-13 ENCOUNTER — Other Ambulatory Visit: Payer: Self-pay

## 2024-09-13 ENCOUNTER — Ambulatory Visit: Admission: RE | Admit: 2024-09-13 | Discharge: 2024-09-13 | Attending: Radiation Oncology

## 2024-09-13 VITALS — BP 118/65 | HR 80 | Temp 98.0°F | Resp 16 | Ht 71.0 in | Wt 158.3 lb

## 2024-09-13 DIAGNOSIS — E538 Deficiency of other specified B group vitamins: Secondary | ICD-10-CM

## 2024-09-13 DIAGNOSIS — D61818 Other pancytopenia: Secondary | ICD-10-CM

## 2024-09-13 DIAGNOSIS — Z510 Encounter for antineoplastic radiation therapy: Secondary | ICD-10-CM | POA: Diagnosis not present

## 2024-09-13 LAB — RAD ONC ARIA SESSION SUMMARY
Course Elapsed Days: 42
Plan Fractions Treated to Date: 31
Plan Prescribed Dose Per Fraction: 2 Gy
Plan Total Fractions Prescribed: 33
Plan Total Prescribed Dose: 66 Gy
Reference Point Dosage Given to Date: 62 Gy
Reference Point Session Dosage Given: 2 Gy
Session Number: 31

## 2024-09-13 MED ORDER — FILGRASTIM-SNDZ 300 MCG/0.5ML IJ SOSY
300.0000 ug | PREFILLED_SYRINGE | Freq: Once | INTRAMUSCULAR | Status: AC
  Administered 2024-09-13: 300 ug via SUBCUTANEOUS
  Filled 2024-09-13: qty 0.5

## 2024-09-13 NOTE — Patient Instructions (Signed)
 Filgrastim Injection What is this medication? FILGRASTIM (fil GRA stim) lowers the risk of infection in people who are receiving chemotherapy. It works by Systems analyst make more white blood cells, which protects your body from infection. It may also be used to help people who have been exposed to high doses of radiation. It can be used to help prepare your body before a stem cell transplant. It works by helping your bone marrow make and release stem cells into the blood. This medicine may be used for other purposes; ask your health care provider or pharmacist if you have questions. COMMON BRAND NAME(S): Neupogen, Nivestym, Nypozi, Releuko, Zarxio What should I tell my care team before I take this medication? They need to know if you have any of these conditions: History of blood diseases, such as sickle cell anemia Kidney disease Recent or ongoing radiation An unusual or allergic reaction to filgrastim, pegfilgrastim, latex, rubber, other medications, foods, dyes, or preservatives Pregnant or trying to get pregnant Breast-feeding How should I use this medication? This medication is injected under the skin or into a vein. It is usually given by your care team in a hospital or clinic setting. It may be given at home. If you get this medication at home, you will be taught how to prepare and give it. Use exactly as directed. Take it as directed on the prescription label at the same time every day. Keep taking it unless your care team tells you to stop. It is important that you put your used needles and syringes in a special sharps container. Do not put them in a trash can. If you do not have a sharps container, call your pharmacist or care team to get one. This medication comes with INSTRUCTIONS FOR USE. Ask your pharmacist for directions on how to use this medication. Read the information carefully. Talk to your pharmacist or care team if you have questions. Talk to your care team about the use of  this medication in children. While it may be prescribed for children for selected conditions, precautions do apply. Overdosage: If you think you have taken too much of this medicine contact a poison control center or emergency room at once. NOTE: This medicine is only for you. Do not share this medicine with others. What if I miss a dose? It is important not to miss any doses. Talk to your care team about what to do if you miss a dose. What may interact with this medication? Medications that may cause a release of neutrophils, such as lithium This list may not describe all possible interactions. Give your health care provider a list of all the medicines, herbs, non-prescription drugs, or dietary supplements you use. Also tell them if you smoke, drink alcohol, or use illegal drugs. Some items may interact with your medicine. What should I watch for while using this medication? Your condition will be monitored carefully while you are receiving this medication. You may need bloodwork while taking this medication. Talk to your care team about your risk of cancer. You may be more at risk for certain types of cancer if you take this medication. What side effects may I notice from receiving this medication? Side effects that you should report to your care team as soon as possible: Allergic reactions--skin rash, itching, hives, swelling of the face, lips, tongue, or throat Capillary leak syndrome--stomach or muscle pain, unusual weakness or fatigue, feeling faint or lightheaded, decrease in the amount of urine, swelling of the ankles, hands,  or feet, trouble breathing High white blood cell level--fever, fatigue, trouble breathing, night sweats, change in vision, weight loss Inflammation of the aorta--fever, fatigue, back, chest, or stomach pain, severe headache Kidney injury (glomerulonephritis)--decrease in the amount of urine, red or dark brown urine, foamy or bubbly urine, swelling of the ankles, hands,  or feet Shortness of breath or trouble breathing Spleen injury--pain in upper left stomach or shoulder Unusual bruising or bleeding Side effects that usually do not require medical attention (report to your care team if they continue or are bothersome): Back pain Bone pain Fatigue Fever Headache Nausea This list may not describe all possible side effects. Call your doctor for medical advice about side effects. You may report side effects to FDA at 1-800-FDA-1088. Where should I keep my medication? Keep out of the reach of children and pets. Keep this medication in the original packaging until you are ready to take it. Protect from light. See product for storage information. Each product may have different instructions. Get rid of any unused medication after the expiration date. To get rid of medications that are no longer needed or have expired: Take the medication to a medications take-back program. Check with your pharmacy or law enforcement to find a location. If you cannot return the medication, ask your pharmacist or care team how to get rid of this medication safely. NOTE: This sheet is a summary. It may not cover all possible information. If you have questions about this medicine, talk to your doctor, pharmacist, or health care provider.  2024 [employer] Standard (2022-03-05 00:00:00)

## 2024-09-14 ENCOUNTER — Ambulatory Visit
Admission: RE | Admit: 2024-09-14 | Discharge: 2024-09-14 | Disposition: A | Discharge status: Home / Self Care | Source: Ambulatory Visit | Attending: Radiation Oncology | Admitting: Radiation Oncology

## 2024-09-14 ENCOUNTER — Inpatient Hospital Stay

## 2024-09-14 ENCOUNTER — Encounter: Payer: Self-pay | Admitting: Oncology

## 2024-09-14 ENCOUNTER — Inpatient Hospital Stay (HOSPITAL_BASED_OUTPATIENT_CLINIC_OR_DEPARTMENT_OTHER): Admitting: Oncology

## 2024-09-14 ENCOUNTER — Other Ambulatory Visit: Payer: Self-pay

## 2024-09-14 ENCOUNTER — Other Ambulatory Visit: Payer: Self-pay | Admitting: Nurse Practitioner

## 2024-09-14 VITALS — BP 94/61 | HR 82 | Temp 98.1°F | Resp 14 | Wt 156.4 lb

## 2024-09-14 VITALS — BP 102/60 | HR 73 | Temp 98.1°F | Resp 12 | Wt 156.4 lb

## 2024-09-14 DIAGNOSIS — E538 Deficiency of other specified B group vitamins: Secondary | ICD-10-CM

## 2024-09-14 DIAGNOSIS — C099 Malignant neoplasm of tonsil, unspecified: Secondary | ICD-10-CM

## 2024-09-14 DIAGNOSIS — Z510 Encounter for antineoplastic radiation therapy: Secondary | ICD-10-CM | POA: Diagnosis not present

## 2024-09-14 DIAGNOSIS — D61818 Other pancytopenia: Secondary | ICD-10-CM

## 2024-09-14 LAB — RAD ONC ARIA SESSION SUMMARY
Course Elapsed Days: 43
Plan Fractions Treated to Date: 32
Plan Prescribed Dose Per Fraction: 2 Gy
Plan Total Fractions Prescribed: 33
Plan Total Prescribed Dose: 66 Gy
Reference Point Dosage Given to Date: 64 Gy
Reference Point Session Dosage Given: 2 Gy
Session Number: 32

## 2024-09-14 LAB — CBC WITH DIFFERENTIAL (CANCER CENTER ONLY)
Abs Immature Granulocytes: 0.2 K/uL — ABNORMAL HIGH (ref 0.00–0.07)
Basophils Absolute: 0 K/uL (ref 0.0–0.1)
Basophils Relative: 1 %
Eosinophils Absolute: 0 K/uL (ref 0.0–0.5)
Eosinophils Relative: 1 %
HCT: 33.5 % — ABNORMAL LOW (ref 39.0–52.0)
Hemoglobin: 11.9 g/dL — ABNORMAL LOW (ref 13.0–17.0)
Immature Granulocytes: 10 %
Lymphocytes Relative: 16 %
Lymphs Abs: 0.3 K/uL — ABNORMAL LOW (ref 0.7–4.0)
MCH: 32.6 pg (ref 26.0–34.0)
MCHC: 35.5 g/dL (ref 30.0–36.0)
MCV: 91.8 fL (ref 80.0–100.0)
Monocytes Absolute: 0.3 K/uL (ref 0.1–1.0)
Monocytes Relative: 13 %
Neutro Abs: 1.2 K/uL — ABNORMAL LOW (ref 1.7–7.7)
Neutrophils Relative %: 59 %
Platelet Count: 81 K/uL — ABNORMAL LOW (ref 150–400)
RBC: 3.65 MIL/uL — ABNORMAL LOW (ref 4.22–5.81)
RDW: 13.5 % (ref 11.5–15.5)
Smear Review: NORMAL
WBC Count: 1.9 K/uL — ABNORMAL LOW (ref 4.0–10.5)
nRBC: 0 % (ref 0.0–0.2)

## 2024-09-14 LAB — BASIC METABOLIC PANEL - CANCER CENTER ONLY
Anion gap: 9 (ref 5–15)
BUN: 25 mg/dL — ABNORMAL HIGH (ref 8–23)
CO2: 29 mmol/L (ref 22–32)
Calcium: 9.5 mg/dL (ref 8.9–10.3)
Chloride: 97 mmol/L — ABNORMAL LOW (ref 98–111)
Creatinine: 0.89 mg/dL (ref 0.61–1.24)
GFR, Estimated: >60 mL/min (ref >60)
Glucose, Bld: 100 mg/dL — ABNORMAL HIGH (ref 70–99)
Potassium: 4.5 mmol/L (ref 3.5–5.1)
Sodium: 134 mmol/L — ABNORMAL LOW (ref 135–145)

## 2024-09-14 LAB — MAGNESIUM: Magnesium: 2 mg/dL (ref 1.7–2.4)

## 2024-09-14 MED ORDER — SODIUM CHLORIDE 0.9 % IV SOLN: Freq: Once | INTRAVENOUS | Status: AC

## 2024-09-14 MED ORDER — FILGRASTIM-SNDZ 300 MCG/0.5ML IJ SOSY
300.0000 ug | PREFILLED_SYRINGE | Freq: Once | INTRAMUSCULAR | Status: AC
  Administered 2024-09-14: 300 ug via SUBCUTANEOUS
  Filled 2024-09-14: qty 0.5

## 2024-09-14 MED ORDER — ONDANSETRON HCL 4 MG/2ML IJ SOLN: 8.0000 mg | Freq: Once | INTRAMUSCULAR | Status: DC

## 2024-09-14 MED FILL — Fosaprepitant Dimeglumine For IV Infusion 150 MG (Base Eq): INTRAVENOUS | Qty: 5 | Status: AC

## 2024-09-14 NOTE — Patient Instructions (Signed)

## 2024-09-14 NOTE — Progress Notes (Signed)
 [facility]  ONCOLOGY CLINIC PROGRESS NOTE   Patient Care Team: Marvene Prentice SAUNDERS, FNP as PCP - General (Family Medicine) Carlie Clark, MD as Consulting Physician (Otolaryngology) Izell Domino, MD as Attending Physician (Radiation Oncology) Daryle Heron CROME, RD as Dietitian (Nutrition) Malmfelt, Delon CROME, RN as Oncology Nurse Navigator [name], MD as Consulting Physician (Oncology) Lauralee Chew, MD as Referring Physician (Otolaryngology)  PATIENT NAME: Gregory Jenkins   MR#: [id number] DOB: September 17, 1951  Date of visit: 09/14/2024   ASSESSMENT & PLAN:   Gregory Jenkins is a 73 y.o. gentleman with a past medical history of left tonsillar squamous cell carcinoma, managed with concurrent chemoradiation using cetuximab  in 2017, went into remission, was referred to our clinic for recently diagnosed right tonsillar cancer. cT2,cN0,cM0,p16- , Stage II.   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  He continued to have follow-ups up until 2022 with no evidence of disease clinically and on imaging.  Previously treated by Dr. Lonn in our clinic.  Now with a recurrence on the right side, p16 negative.  No evidence of metastatic disease on PET scan.  Please review oncology history for additional details and timeline of events.  He was seen in consultation by Dr. Lauralee (surgical ENT) on 06/28/24 to discuss surgical treatment options. Although surgery would likely provide him with the best outcome, Dr. 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 In this setting, Dr. Lauralee would like him to be considered for re-irradiation.   His case was discussed in our ENT tumor conference on 07/12/2024.  Consensus opinion is to proceed with concurrent chemoradiation with  cisplatin.  On 07/17/2024, MRI of the neck showed nodular thickening and enhancement of the right palatine tonsil extending to the soft palate, base of the uvula, and lateral/posterolateral oropharyngeal mucosa, compatible with oropharyngeal malignancy. 7 mm nodule in the superficial lobe of the right parotid corresponding to prior PET uptake, suspicious for primary parotid neoplasm or metastatic lymph node.  On 07/24/2024, core biopsy of the right parotid lesion showed bland oncocytic lesion, benign finding.  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   Plan made to proceed with concurrent chemoradiation using weekly cisplatin.  Started radiation treatments from 08/02/2024.  Received cycle 1 of cisplatin on 08/04/2024.   He has been tolerating treatments reasonably well with some expected side effects like mucositis, fatigue.  He is due for cycle # 7 of cisplatin tomorrow.  Labs today showed stable leukopenia with white count of 1900, ANC of 1200 platelet count decreased but improved at 81,000.  Hemoglobin 11.9.  Since tomorrow would be the last of the planned chemotherapy, we will proceed with standard strength cisplatin at 40 mg/m and arrange for Zarxio for 2 days next week, when he comes for IV fluid support.  We will arrange for 1 L of NS bolus 2 days next week on 09/18/2024 and 09/20/2024, at which time he will also receive Zarzio injections.  - Ensure hydration with 70-80 ounces of fluid daily - Prescribed antiemetics for nausea management  He is maintaining nutrition without need for feeding tube.  Since he will complete planned chemotherapy tomorrow, I will see him approximately 3 weeks for reevaluation with repeat labs.  Oral mucositis due to chemotherapy and radiation Experiencing oral mucositis characterized by rawness in the mouth, likely due  to radiation therapy. Sediment in the mouth has resolved. Continues to use magic mouthwash for symptom  management. - Continue using magic mouthwash for oral mucositis management.  Chemotherapy-induced neutropenia, thrombocytopenia, and anemia Neutropenia and thrombocytopenia have improved with dose adjustments. White blood cell count is 1,900 with neutrophils at 1,200, and platelets are 81,000. Hemoglobin is 11.9. No transfusion needed at this time. Plan to support blood count recovery with booster injections and fluids. - Will administer booster injections next week on Monday and Wednesday to support blood count recovery. - Will schedule fluids on Monday and Wednesday next week to support blood pressure and hydration.  Dehydration requiring intravenous fluid support Requiring intravenous fluid support to manage dehydration. Fluids administered today.  Additional fluids planned for Tuesday next week to coincide with radiation therapy. - Administered intravenous fluids today. - Scheduled intravenous fluids for Tuesday and Thursday next week.  Vitamin B12 deficiency Vitamin B12 deficiency managed with monthly B12 injections. Coordination with oncology clinic to administer B12 injections during treatment visits to reduce additional appointments. - Administer B12 injection at oncology clinic starting in October - Coordinate with family doctor to transition B12 administration to oncology clinic    I reviewed lab results and outside records for this visit and discussed relevant results with the patient. Diagnosis, plan of care and treatment options were also discussed in detail with the patient. Opportunity provided to ask questions and answers provided to his apparent satisfaction. Provided instructions to call our clinic with any problems, questions or concerns prior to return visit. I recommended to continue follow-up with PCP and sub-specialists. He verbalized understanding and agreed with the plan.   NCCN guidelines have been consulted in the planning of this patient's care.  I spent a total of  42 minutes during this encounter with the patient including review of chart and various tests results, discussions about plan of care and coordination of care plan.   [name], MD  09/14/2024 5:24 PM   [facility] - [facility] [address] [place] [address] Dept: [phone] Dept Fax: [phone]    CHIEF COMPLAINT/ REASON FOR VISIT:   Recently diagnosed right tonsillar cancer. cT2,cN0,cM0,p16- , Stage II. Has history of left-sided tonsillar cancer treated in 2017 with chemoradiation using cetuximab .   Current Treatment: Started concurrent chemoradiation with weekly cisplatin  from 08/02/2024.  Completed treatments on 09/15/2024.  INTERVAL HISTORY:    Discussed the use of AI scribe software for clinical note transcription with the patient, who gave verbal consent to proceed.  History of Present Illness  Tomaz Janis is a 73 year old male undergoing chemotherapy and radiation therapy who presents for evaluation of treatment response and side effects.  He is currently undergoing chemotherapy and radiation therapy. His white blood cell count has increased slightly, with a current count of 1,900, and neutrophils at 1,200. Platelets have improved to 81,000 following a dose adjustment. Hemoglobin is stable at 11.9, and kidney function is normal with a creatinine level of 0.89.  He experiences oral mucositis and uses a 'magic mouthwash' to manage this symptom. No significant side effects from chemotherapy have been reported.  He has frequent nocturia, approximately every hour to hour and a half, which his caregiver associates with fluctuations in his blood pressure. His blood pressure tends to be lower on days following frequent urination at night.  He is currently taking Claritin and Advil to manage side effects, and these  medications have helped mitigate anticipated severe side effects.  He  has been prescribed an antidepressant, but its effectiveness is unclear. His caregiver mentions that he was advised to taper off the medication slowly if he decides to discontinue it.  He is experiencing some hearing loss. His hearing will be reassessed in January before considering new hearing aids.   I have reviewed the past medical history, past surgical history, social history and family history with the patient and they are unchanged from previous note.  HISTORY OF PRESENT ILLNESS:   ONCOLOGY HISTORY:   Initial diagnosis of left tonsillar cancer in 2017 for which he received radiation therapy to the left tonsil and involved bilateral cervical lymph nodes. This was concurrent with systemic therapy consisting of cetuximab . He was last seen by Rad Onc for a follow-up visit on 10/02/2016 and was NED on examination and imaging at that time.    He continued to follow with Dr. Carlie (ENT) and with Dr. Lonn under surveillance until 2022. He did have follow-up imaging performed during that interval of time which consisted of a follow-up neck CT on 02/13/2019 that showed NED and stable post treatment changes in the neck.    He did however return to Dr. Ilona office on 02/24/22 for evaluation of a spot on the back of his tongue. However, oral exam performed at that time did not show any abnormal findings to correlate with the reported concern.    He presented to Dr. Ilona office on 06/07/24 with a 3 month history of a sore throat which he characterized as feeling raw on the right side of his throat (with occasional extension to the roof of his mouth), as well as ear pressure. Oral exam performed at that time noted an exudative mass of the right tonsillar fossa. A biopsy of the right tonsillar mass was accordingly obtained at that time that showed findings consistent with invasive squamous cell carcinoma (moderately differentiated); p16 negative.    Soft tissue neck CT with contrast on 06/19/24  demonstrated: the right palatine tonsillar mass extending onto the soft palate and into the right glossotonsillar sulcus, consistent with SCC, measuring at least 26 mm. No abnormal lymph nodes were demonstrated in the neck. A chest CT was also performed that same day which showed several indeterminate punctate pulmonary nodules, but no definite evidence of pulmonary metastatic disease.    He was seen in consultation by Dr. Lauralee (surgical ENT) on 06/28/24 to discuss surgical treatment options. Although surgery would likely provide him with the best outcome, Dr. 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 In this setting, Dr. Lauralee would like him to be considered for re-irradiation. He has also been referred back to medical oncology.   PET scan performed on 07/04/24 demonstrated: the hypermetabolic lesion in the vicinity of the right palatine tonsil consistent with malignancy, and with potential extension to the uvula, and a hypermetabolic nodule in the upper right parotid gland with potential differential considerations including primary parotid neoplasm vs a metastatic lymph node noted. Imaging otherwise showed no evidence of metastatic disease to the chest, abdomen, or pelvis. Other findings of potential clinical significance included a bladder calculus and prostatomegaly with a prominent median lobe indenting the bladder base.    His case was discussed in tumor conference on 07/12/2024.  Plan made to proceed with MRI of the neck for further evaluation and delineation of the tonsil mass.  Also plan for biopsy  of the parotid lesion.  On 07/17/2024, MRI of the neck showed nodular thickening and enhancement of the right palatine tonsil extending to the soft palate, base of the uvula, and lateral/posterolateral oropharyngeal mucosa, compatible with oropharyngeal malignancy. 7 mm nodule in the superficial lobe of the right  parotid corresponding to prior PET uptake, suspicious for primary parotid neoplasm or metastatic lymph node.  On 07/24/2024, core biopsy of the right parotid lesion showed bland oncocytic lesion, benign finding.  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   Plan made to proceed with concurrent chemoradiation using weekly cisplatin.  Started this from 08/02/2024.  Completed chemoradiation treatments with cycle 7 of cisplatin on 09/15/2024.  Oncology History  Malignant neoplasm of tonsillar fossa (HCC)  11/12/2015 Procedure   Accession: [id number] FNA of left neck LN positive for squamous cell cancer   11/18/2015 Imaging   Left greater than right cervical lymphadenopathy suspicious for nodal metastatic disease.2. Mild asymmetry of the left tonsil -- correlate with direct visualization to assess for primary neoplasm.   11/20/2015 Procedure   He has left tonsil biopsy   11/20/2015 Pathology Results   Accession: [id number] Left tonsil biopsy showed squamous cell cancer, p16 positive.   11/28/2015 PET scan   1. Left palatine tonsil primary with left worse than right cervical nodal metastasis. 2. No extracervical hypermetabolic metastasis.    12/12/2015 Procedure   Peg and port-a-cath placement.   12/18/2015 - 01/29/2016 Chemotherapy   He received weekly cetuximab    12/18/2015 - 02/03/2016 Radiation Therapy   Received Helicle IMRT Tomotherapy:  Left tonsil and bilateral neck / 70 Gy in 35 fractions to gross disease, 63 Gy in 35 fractions to high risk nodal echelons, and 56 Gy in 35 fractions to intermediate risk nodal echelons.   12/31/2015 Adverse Reaction   Treatment #3 is placed on hold due to worsening mucositis   01/08/2016 Miscellaneous   Cetuximab  #3 resumed with 50% dose adjustment   01/14/2016 Adverse Reaction   Treatment #4 is placed on hold due to worsening mucositis and failure to thrive   03/20/2016 Imaging   CT abdomen showed mild gallbladder wall thickening  with pericholecystic fluid and several gallstones. The gallbladder is nondistended. Recommend clinical correlation for acute or chronic cholecystitis.   04/15/2016 Procedure   PEG removed.   05/26/2016 Imaging   Restaging PET:  Marked interval improvement with resolution of left tonsillar hypermetabolism and right cervical lymph node hypermetabolism. Left cervical lymph nodes have decreased substantially in size and there is a subtle degree of FDG hypermetabolism still associated with these left-sided cervical nodes.   10/12/2016 Procedure   Port-a-cath removed.   02/14/2019 Imaging   CT neck 1. Satisfactory post treatment appearance of the neck. NI-Rads category 1. 2. No mass or abnormality to correspond to the marked area of concern at the level of the right submandibular gland.     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  ALLERGIES: He has no known allergies.  MEDICATIONS:  Current Outpatient Medications  Medication Sig Dispense Refill   acetaminophen  (TYLENOL ) 500 MG tablet Take 1,000 mg by mouth every 6 (six) hours as needed for moderate pain.     aspirin  EC 81 MG tablet Take 81 mg by mouth daily.     atorvastatin  (LIPITOR) 10 MG tablet Take 10 mg by mouth daily.     cyanocobalamin  (VITAMIN B12) 1000 MCG/ML injection Inject 1,000 mcg into the muscle every 30 (thirty) days.     dexamethasone  (DECADRON ) 4 MG tablet Take 2 tablets (8 mg) by mouth daily x 3 days  starting the day after cisplatin  chemotherapy. Take with food. 30 tablet 1   fluconazole  (DIFLUCAN ) 100 MG tablet Take 2 tablets today, then 1 tablet daily x 20 more days. 22 tablet 0   ipratropium (ATROVENT) 0.06 % nasal spray Place 2 sprays into both nostrils daily as needed for rhinitis.     levothyroxine  (SYNTHROID ) 125 MCG tablet Take 125 mcg by mouth daily before breakfast.  3   lidocaine  (XYLOCAINE ) 2 % solution Patient: Mix 1part 2% viscous lidocaine , 1part H20. Swish & swallow 10mL of diluted mixture, 30min before meals and at bedtime, up to QID 200 mL 3   lidocaine -prilocaine  (EMLA ) cream Apply to affected area once 30 g 3   magic mouthwash (nystatin , lidocaine , diphenhydrAMINE , alum & mag hydroxide) suspension Swish and spit 5 mLs 4 (four) times daily as needed for mouth pain. Can swish and swallow if needed 360 mL 3   mirtazapine  (REMERON ) 7.5 MG tablet TAKE 1 TABLET BY MOUTH AT BEDTIME. 90 tablet 1   ondansetron  (ZOFRAN ) 8 MG tablet Take 1 tablet (8 mg total) by mouth every 8 (eight) hours as needed for nausea or vomiting. Start on the third day after cisplatin . 30 tablet 1   prochlorperazine  (COMPAZINE ) 10 MG tablet Take 1 tablet (10 mg total) by mouth every 6 (six) hours as needed (Nausea or vomiting). 30 tablet 1   No current facility-administered medications for this visit.     VITALS:   Blood pressure 94/61, pulse 82, temperature 98.1 F (36.7 C), temperature source Oral, resp. rate 14, weight 156 lb 6 oz (70.9 kg), SpO2 100%.  Wt Readings from Last 3 Encounters:  09/14/24 156 lb 6 oz (70.9 kg)  09/14/24 156 lb 6 oz (70.9 kg)  09/13/24 158 lb 4.8 oz (71.8 kg)    Body mass index is 21.81 kg/m.       PHYSICAL EXAM:   Physical Exam Constitutional:      General: He is not in acute distress.    Appearance: Normal appearance.  HENT:     Head: Normocephalic and atraumatic.  Eyes:     Conjunctiva/sclera: Conjunctivae normal.  Neck:     Comments: Post radiation  therapy fibrosis noted in the neck area, well-healed.   No definite palpable cervical lymphadenopathy Cardiovascular:     Rate and Rhythm: Normal rate and regular rhythm.  Pulmonary:     Effort: Pulmonary effort is normal. No respiratory distress.  Abdominal:     General: There is no distension.  Neurological:     General: No focal deficit present.     Mental Status: He is alert and oriented to person, place, and time.  Psychiatric:        Mood and Affect: Mood normal.        Behavior: Behavior normal.      LABORATORY DATA:   I have  reviewed the data as listed.  Results for orders placed or performed in visit on 09/14/24  Rad Onc Aria Session Summary  Result Value Ref Range   Course ID [id number]    Course Start Date 07/18/2024    Session Number 32    Course First Treatment Date 08/02/2024  3:17 PM    Course Last Treatment Date 09/14/2024  1:04 PM    Course Elapsed Days 43    Reference Point ID HN_R_Tonsil DP    Reference Point Dosage Given to Date 64 Gy   Reference Point Session Dosage Given 2 Gy   Plan ID HN_R_Tonsil    Plan Name HN_R_Tonsil    Plan Fractions Treated to Date 32    Plan Total Fractions Prescribed 33    Plan Prescribed Dose Per Fraction 2 Gy   Plan Total Prescribed Dose 66.000000 Gy   Plan Primary Reference Point HN_R_Tonsil DP   Results for orders placed or performed in visit on 09/14/24  Magnesium   Result Value Ref Range   Magnesium  2.0 1.7 - 2.4 mg/dL  Basic Metabolic Panel - Cancer Center Only  Result Value Ref Range   Sodium 134 (L) 135 - 145 mmol/L   Potassium 4.5 3.5 - 5.1 mmol/L   Chloride 97 (L) 98 - 111 mmol/L   CO2 29 22 - 32 mmol/L   Glucose, Bld 100 (H) 70 - 99 mg/dL   BUN 25 (H) 8 - 23 mg/dL   Creatinine 9.10 9.38 - 1.24 mg/dL   Calcium  9.5 8.9 - 10.3 mg/dL   GFR, Estimated >39 >39 mL/min   Anion gap 9 5 - 15  CBC with Differential (Cancer Center Only)  Result Value Ref Range   WBC Count 1.9 (L) 4.0 - 10.5 K/uL   RBC 3.65 (L) 4.22 -  5.81 MIL/uL   Hemoglobin 11.9 (L) 13.0 - 17.0 g/dL   HCT 66.4 (L) 60.9 - 47.9 %   MCV 91.8 80.0 - 100.0 fL   MCH 32.6 26.0 - 34.0 pg   MCHC 35.5 30.0 - 36.0 g/dL   RDW 86.4 88.4 - 84.4 %   Platelet Count 81 (L) 150 - 400 K/uL   nRBC 0.0 0.0 - 0.2 %   Neutrophils Relative % 59 %   Neutro Abs 1.2 (L) 1.7 - 7.7 K/uL   Lymphocytes Relative 16 %   Lymphs Abs 0.3 (L) 0.7 - 4.0 K/uL   Monocytes Relative 13 %   Monocytes Absolute 0.3 0.1 - 1.0 K/uL   Eosinophils Relative 1 %   Eosinophils Absolute 0.0 0.0 - 0.5 K/uL   Basophils Relative 1 %   Basophils Absolute 0.0 0.0 - 0.1 K/uL   WBC Morphology See Note    Smear Review Normal platelet morphology    Immature Granulocytes 10 %   Abs Immature Granulocytes 0.20 (H) 0.00 - 0.07 K/uL   Ovalocytes PRESENT       RADIOGRAPHIC STUDIES:  No recent pertinent imaging available to review.   CODE STATUS:  Code Status History     Date Active Date Inactive Code Status Order ID Comments User Context   07/19/2024 1643 07/20/2024 0515 Full Code [id number]  Hughes Simmonds, MD [facility]   06/20/2023 1423 06/23/2023 1934 Full Code [id number]  Waddell Rake, MD ED   05/31/2023 1005 06/02/2023 0101 Full Code [id number]  Magda Debby SAILOR, MD Inpatient    Questions for Most Recent Historical Code Status (Order [id number])     Question Answer   By: Consent: discussion documented  in EHR            Orders Placed This Encounter  Procedures   CBC with Differential (Cancer Center Only)    Standing Status:   Future    Expiration Date:   09/14/2025   CMP (Cancer Center only)    Standing Status:   Future    Expiration Date:   09/14/2025   Magnesium    Standing Status:   Future    Expiration Date:   09/14/2025     Future Appointments  Date Time Provider Department Center  09/15/2024  7:30 AM [facility] LINAC 3 [facility] None  09/15/2024  7:45 AM LINAC-SQUIRE [facility] None  09/15/2024  8:00 AM [facility] INFUSION [facility] None  09/15/2024  9:45 AM  Neff, Barbara L, RD [facility] None  09/18/2024  9:00 AM [facility] 2 [facility] None  09/20/2024  9:00 AM [facility]TOM MANAGEMENT CLINIC 2 [facility] None  10/05/2024  9:00 AM [facility] FLUSH [facility] None  10/05/2024  9:30 AM Lima Chillemi, MD [facility] None  10/10/2024 10:00 AM Breedlove Blue, Blaire L, PT [facility] None  10/10/2024 11:00 AM Schinke, Lupita NOVAK, CCC-SLP [facility]     This document was completed utilizing speech recognition software. Grammatical errors, random word insertions, pronoun errors, and incomplete sentences are an occasional consequence of this system due to software limitations, ambient noise, and hardware issues. Any formal questions or concerns about the content, text or information contained within the body of this dictation should be directly addressed to the provider for clarification.

## 2024-09-14 NOTE — Assessment & Plan Note (Addendum)
 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  He continued to have follow-ups up until 2022 with no evidence of disease clinically and on imaging.  Previously treated by Dr. Lonn in our clinic.  Now with a recurrence on the right side, p16 negative.  No evidence of metastatic disease on PET scan.  Please review oncology history for additional details and timeline of events.  He was seen in consultation by Dr. Lauralee (surgical ENT) on 06/28/24 to discuss surgical treatment options. Although surgery would likely provide him with the best outcome, Dr. 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 In this setting, Dr. Lauralee would like him to be considered for re-irradiation.   His case was discussed in our ENT tumor conference on 07/12/2024.  Consensus opinion is to proceed with concurrent chemoradiation with cisplatin.  On 07/17/2024, MRI of the neck showed nodular thickening and enhancement of the right palatine tonsil extending to the soft palate, base of the uvula, and lateral/posterolateral oropharyngeal mucosa, compatible with oropharyngeal malignancy. 7 mm nodule in the superficial lobe of the right parotid corresponding to prior PET uptake, suspicious for primary parotid neoplasm or metastatic lymph node.  On 07/24/2024, core biopsy of the right parotid lesion showed bland oncocytic lesion, benign finding.  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   Plan made to proceed with concurrent chemoradiation using weekly cisplatin.  Started radiation treatments from 08/02/2024.  Received cycle 1 of cisplatin on 08/04/2024.   He has been tolerating treatments reasonably well with some expected side effects like mucositis, fatigue.  He is due for cycle # 7 of cisplatin tomorrow.  Labs today showed stable leukopenia with  white count of 1900, ANC of 1200 platelet count decreased but improved at 81,000.  Hemoglobin 11.9.  Since tomorrow would be the last of the planned chemotherapy, we will proceed with standard strength cisplatin at 40 mg/m and arrange for Zarxio for 2 days next week, when he comes for IV fluid support.  We will arrange for 1 L of NS bolus 2 days next week on 09/18/2024 and 09/20/2024, at which time he will also receive Zarzio injections.  - Ensure hydration with 70-80 ounces of fluid daily - Prescribed antiemetics for nausea management  He is maintaining nutrition without need for feeding tube.  Since he will complete planned chemotherapy tomorrow, I will see him approximately 3 weeks for reevaluation with repeat labs.

## 2024-09-15 ENCOUNTER — Ambulatory Visit
Admission: RE | Admit: 2024-09-15 | Discharge: 2024-09-15 | Disposition: A | Discharge status: Home / Self Care | Source: Ambulatory Visit | Attending: Radiation Oncology | Admitting: Radiation Oncology

## 2024-09-15 ENCOUNTER — Other Ambulatory Visit: Payer: Self-pay

## 2024-09-15 ENCOUNTER — Inpatient Hospital Stay

## 2024-09-15 ENCOUNTER — Inpatient Hospital Stay: Admitting: Nutrition

## 2024-09-15 VITALS — BP 112/62 | HR 70 | Temp 98.7°F | Resp 16

## 2024-09-15 DIAGNOSIS — Z510 Encounter for antineoplastic radiation therapy: Secondary | ICD-10-CM | POA: Diagnosis not present

## 2024-09-15 DIAGNOSIS — C099 Malignant neoplasm of tonsil, unspecified: Secondary | ICD-10-CM

## 2024-09-15 LAB — RAD ONC ARIA SESSION SUMMARY
Course Elapsed Days: 44
Plan Fractions Treated to Date: 33
Plan Prescribed Dose Per Fraction: 2 Gy
Plan Total Fractions Prescribed: 33
Plan Total Prescribed Dose: 66 Gy
Reference Point Dosage Given to Date: 66 Gy
Reference Point Session Dosage Given: 2 Gy
Session Number: 33

## 2024-09-15 MED ORDER — SODIUM CHLORIDE 0.9 % IV SOLN
150.0000 mg | Freq: Once | INTRAVENOUS | Status: AC
  Administered 2024-09-15: 150 mg via INTRAVENOUS
  Filled 2024-09-15: qty 150

## 2024-09-15 MED ORDER — MAGNESIUM SULFATE 2 GM/50ML IV SOLN
2.0000 g | Freq: Once | INTRAVENOUS | Status: AC
  Administered 2024-09-15: 2 g via INTRAVENOUS
  Filled 2024-09-15: qty 50

## 2024-09-15 MED ORDER — DEXAMETHASONE SOD PHOSPHATE PF 10 MG/ML IJ SOLN
10.0000 mg | Freq: Once | INTRAMUSCULAR | Status: AC
  Administered 2024-09-15: 10 mg via INTRAVENOUS

## 2024-09-15 MED ORDER — SODIUM CHLORIDE 0.9 % IV SOLN: INTRAVENOUS | Status: DC

## 2024-09-15 MED ORDER — SODIUM CHLORIDE 0.9 % IV SOLN
40.0000 mg/m2 | Freq: Once | INTRAVENOUS | Status: AC
  Administered 2024-09-15: 78 mg via INTRAVENOUS
  Filled 2024-09-15: qty 78

## 2024-09-15 MED ORDER — POTASSIUM CHLORIDE IN NACL 20-0.9 MEQ/L-% IV SOLN
Freq: Once | INTRAVENOUS | Status: AC
  Filled 2024-09-15: qty 1000

## 2024-09-15 MED ORDER — PALONOSETRON HCL INJECTION 0.25 MG/5ML
0.2500 mg | Freq: Once | INTRAVENOUS | Status: AC
  Administered 2024-09-15: 0.25 mg via INTRAVENOUS
  Filled 2024-09-15: qty 5

## 2024-09-15 NOTE — Patient Instructions (Signed)
 [facility] - A DEPT OF . [facility]   Discharge Instructions: Thank you for choosing [facility] to provide your oncology and hematology care.   *Congratulations on completing chemotherapy!*  If you have a lab appointment with the Cancer Center, please go directly to the Cancer Center and check in at the registration area.   Wear comfortable clothing and clothing appropriate for easy access to any Portacath or PICC line.   We strive to give you quality time with your provider. You may need to reschedule your appointment if you arrive late (15 or more minutes).  Arriving late affects you and other patients whose appointments are after yours.  Also, if you miss three or more appointments without notifying the office, you may be dismissed from the clinic at the provider's discretion.      For prescription refill requests, have your pharmacy contact our office and allow 72 hours for refills to be completed.    Today you received the following chemotherapy and/or immunotherapy agents: Cisplatin        To help prevent nausea and vomiting after your treatment, we encourage you to take your nausea medication as directed.  BELOW ARE SYMPTOMS THAT SHOULD BE REPORTED IMMEDIATELY: *FEVER GREATER THAN 100.4 F (38 C) OR HIGHER *CHILLS OR SWEATING *NAUSEA AND VOMITING THAT IS NOT CONTROLLED WITH YOUR NAUSEA MEDICATION *UNUSUAL SHORTNESS OF BREATH *UNUSUAL BRUISING OR BLEEDING *URINARY PROBLEMS (pain or burning when urinating, or frequent urination) *BOWEL PROBLEMS (unusual diarrhea, constipation, pain near the anus) TENDERNESS IN MOUTH AND THROAT WITH OR WITHOUT PRESENCE OF ULCERS (sore throat, sores in mouth, or a toothache) UNUSUAL RASH, SWELLING OR PAIN  UNUSUAL VAGINAL DISCHARGE OR ITCHING   Items with * indicate a potential emergency and should be followed up as soon as possible or go to the Emergency Department if any problems should  occur.  Please show the CHEMOTHERAPY ALERT CARD or IMMUNOTHERAPY ALERT CARD at check-in to the Emergency Department and triage nurse.  Should you have questions after your visit or need to cancel or reschedule your appointment, please contact [facility] DE[facility]  Dept: [phone]  and follow the prompts.  Office hours are 8:00 a.m. to 4:30 p.m. Monday - Friday. Please note that voicemails left after 4:00 p.m. may not be returned until the following business day.  We are closed weekends and major holidays. You have access to a nurse at all times for urgent questions. Please call the main number to the clinic Dept: [phone] and follow the prompts.   For any non-urgent questions, you may also contact your provider using MyChart. We now offer e-Visits for anyone 62 and older to request care online for non-urgent symptoms. For details visit [email or web address].   Also download the MyChart app! Go to the app store, search MyChart, open the app, select [facility], and log in with your MyChart username and password.

## 2024-09-15 NOTE — Progress Notes (Signed)
 Nutrition follow-up completed with patient during infusion for stage II SCC of the right tonsil.  Patient is receiving concurrent chemoradiation therapy.  Today is his final chemotherapy and radiation treatment.  Weight decreased and documented as 156 pounds 6 ounces November 20. This is decreased from 161 pounds 8 ounces on November 5.  Labs include sodium 134, glucose 100, BUN 25, and magnesium  2.0.  Mouth is extremely raw and sore.  He is not eating any solid food right now.  He drinks 4 cartons of boost VHC daily.  This provides 2000 cal and 88 g protein.  He has been thinning it with water for better tolerance.  He denies other nutrition impact symptoms.  Nutrition diagnosis:  Predicted sub-optimal energy intake has  evolved into inadequate oral intake as evidenced by 3% weight loss in less than 2 weeks.  Intervention: Recommended thinning with milk instead of water to provide additional calories and protein. Try to increase to 5 cartons daily. Provided samples. Support provided. Patient knows to reach out to me for questions or concerns.  Monitoring, evaluation, goals: Consume adequate calories and protein to minimize further weight loss and promote healing.  Next visit:  Patient has completed treatment.  I have encouraged him to reach out to RD with questions or concerns.  Will schedule follow-up appointment as needed.  **Disclaimer: This note was dictated with voice recognition software. Similar sounding words can inadvertently be transcribed and this note may contain transcription errors which may not have been corrected upon publication of note.**

## 2024-09-15 NOTE — Progress Notes (Signed)
 Oncology Nurse Navigator Documentation   Met with Gregory Jenkins after final RT while in infusion receiving his final chemotherapy to offer support and to celebrate end of radiation treatment.   Provided verbal post-RT guidance: Importance of keeping all follow-up appts, especially those with Nutrition and SLP. Importance of protecting treatment area from sun. Continuation of Sonafine application 2-3 times daily, application of antibiotic ointment to areas of raw skin; when supply of Sonafine exhausted transition to OTC lotion with vitamin E.  Explained my role as navigator will continue for several more months, encouraged him to call me with needs/concerns.    Delon Jefferson RN, BSN, OCN Head & Neck Oncology Nurse Navigator [facility] Phone # [phone]  Fax # [phone]

## 2024-09-17 NOTE — Radiation Completion Notes (Signed)
 Patient Name: Gregory Jenkins, Gregory Jenkins MRN: [id number] Date of Birth: 1951-10-12 Referring Physician: VAUGHAN RICKER, M.D. Date of Service: 2024-09-17 Radiation Oncologist: Lauraine [name], M.D. [facility]                             RADIATION ONCOLOGY END OF TREATMENT NOTE     Diagnosis: C09.0 Malignant neoplasm of tonsillar fossa Staging on 2015-12-10: Malignant neoplasm of tonsillar fossa (HCC) T=T1, N=N2c, M=M0 Staging on 2024-07-12: Squamous cell carcinoma of right tonsil (HCC) T=cT2, N=cN0, M=cM0 Intent: Curative     ==========DELIVERED PLANS==========  First Treatment Date: 2024-08-02 Last Treatment Date: 2024-09-15   Plan Name: HN_R_Tonsil Site: Tonsil, Right Technique: IMRT Mode: Photon Dose Per Fraction: 2 Gy Prescribed Dose (Delivered / Prescribed): 66 Gy / 66 Gy Prescribed Fxs (Delivered / Prescribed): 33 / 33     ==========ON TREATMENT VISIT DATES========== 2024-08-07, 2024-08-14, 2024-08-21, 2024-08-28, 2024-09-04, 2024-09-11, 2024-09-15     ==========UPCOMING VISITS========== 10/10/2024 [facility] NEURO ST TREATMENT Jacelyn Lupita KATHEE RAEJEAN  10/10/2024 [facility] REH AT [address] Hernandez, Florina CROME, PT  10/05/2024 [facility] EST PT 15 Pasam, Avinash, MD  10/05/2024 [facility] PORT FLUSH W/LAB [facility] MEDONC FLUSH  10/03/2024 [facility] FOLLOW UP 30 Wyatt Leeroy HERO, PA-C  09/20/2024 [facility] INFUSION [facility]  09/18/2024 [facility] INFUSION 2HR30MIN (150) [facility] INFUSION        ==========APPENDIX - ON TREATMENT VISIT NOTES==========   See weekly On Treatment Notes in Epic for details in the Media tab (listed as Progress notes on the On Treatment Visit Dates listed above).

## 2024-09-18 ENCOUNTER — Inpatient Hospital Stay

## 2024-09-18 VITALS — BP 108/62 | HR 71 | Temp 98.1°F | Resp 18

## 2024-09-18 DIAGNOSIS — Z510 Encounter for antineoplastic radiation therapy: Secondary | ICD-10-CM | POA: Diagnosis not present

## 2024-09-18 DIAGNOSIS — E538 Deficiency of other specified B group vitamins: Secondary | ICD-10-CM

## 2024-09-18 DIAGNOSIS — D61818 Other pancytopenia: Secondary | ICD-10-CM

## 2024-09-18 MED ORDER — SODIUM CHLORIDE 0.9 % IV SOLN: Freq: Once | INTRAVENOUS | Status: AC

## 2024-09-18 MED ORDER — CYANOCOBALAMIN 1000 MCG/ML IJ SOLN
1000.0000 ug | Freq: Once | INTRAMUSCULAR | Status: AC
  Administered 2024-09-18: 1000 ug via INTRAMUSCULAR
  Filled 2024-09-18: qty 1

## 2024-09-18 MED ORDER — FILGRASTIM-SNDZ 300 MCG/0.5ML IJ SOSY
300.0000 ug | PREFILLED_SYRINGE | Freq: Once | INTRAMUSCULAR | Status: AC
  Administered 2024-09-18: 300 ug via SUBCUTANEOUS
  Filled 2024-09-18: qty 0.5

## 2024-09-18 NOTE — Patient Instructions (Signed)

## 2024-09-20 ENCOUNTER — Other Ambulatory Visit (HOSPITAL_COMMUNITY): Payer: Self-pay

## 2024-09-20 ENCOUNTER — Inpatient Hospital Stay

## 2024-09-20 VITALS — BP 109/61 | HR 72 | Temp 98.5°F | Resp 16

## 2024-09-20 DIAGNOSIS — E538 Deficiency of other specified B group vitamins: Secondary | ICD-10-CM

## 2024-09-20 DIAGNOSIS — D61818 Other pancytopenia: Secondary | ICD-10-CM

## 2024-09-20 DIAGNOSIS — Z510 Encounter for antineoplastic radiation therapy: Secondary | ICD-10-CM | POA: Diagnosis not present

## 2024-09-20 MED ORDER — FILGRASTIM-SNDZ 300 MCG/0.5ML IJ SOSY
300.0000 ug | PREFILLED_SYRINGE | Freq: Once | INTRAMUSCULAR | Status: AC
  Administered 2024-09-20: 300 ug via SUBCUTANEOUS
  Filled 2024-09-20: qty 0.5

## 2024-09-20 MED ORDER — SODIUM CHLORIDE 0.9 % IV SOLN: Freq: Once | INTRAVENOUS | Status: AC

## 2024-09-20 MED ORDER — ONDANSETRON HCL 4 MG/2ML IJ SOLN
8.0000 mg | Freq: Once | INTRAMUSCULAR | Status: AC
  Administered 2024-09-20: 8 mg via INTRAVENOUS
  Filled 2024-09-20: qty 4

## 2024-09-20 NOTE — Patient Instructions (Signed)

## 2024-09-29 NOTE — Progress Notes (Signed)
 Patient identity verified x2 Mr. Elsie Deane Gear presents today for a follow-up appointment    Treatment Completion Date: 09/15/24 Pain issues, if any:  Using a feeding tube?: N/A Weight changes, if any: Fluctuating Swallowing issues, if any:  Smoking or chewing tobacco? Denies Using fluoride  toothpaste daily? No Last ENT visit was on: 07/25/2024 Dr. Vaughan Ricker, MD Other notable issues, if any: Thick saliva is still present  BP 119/75 (BP Location: Right Arm, Patient Position: Sitting, Cuff Size: Normal)   Pulse 92   Temp (!) 97.5 F (36.4 C)   Resp 18   Ht 5' 11 (1.803 m)   Wt 154 lb 12.8 oz (70.2 kg)   SpO2 100%   BMI 21.59 kg/m

## 2024-10-02 NOTE — Progress Notes (Signed)
 Radiation Oncology         [phone] ________________________________  Name: Gregory Jenkins MRN: [id number]  Date: 10/03/2024  DOB: June 21, 1951  Follow-Up Visit Note  CC: [name], FNP  [name], FNP  Diagnosis and Prior Radiotherapy:    No diagnosis found. ***  ==========DELIVERED PLANS==========  First Treatment Date: 2024-08-02 Last Treatment Date: 2024-09-15   Plan Name: HN_R_Tonsil Site: Tonsil, Right Technique: IMRT Mode: Photon Dose Per Fraction: 2 Gy Prescribed Dose (Delivered / Prescribed): 66 Gy / 66 Gy Prescribed Fxs (Delivered / Prescribed): 33 / 33   Cancer Staging  Malignant neoplasm of tonsillar fossa (HCC) Staging form: Pharynx - Oropharynx, AJCC 7th Edition - Clinical stage from 12/10/2015: Stage IVA (T1, N2c, M0) - Signed by Lonn Hicks, MD on 12/10/2015 Staged by: Managing physician Specimen type: Biopsy / Limited Resection Laterality: Left  Squamous cell carcinoma of right tonsil (HCC) Staging form: Pharynx - P16 Negative Oropharynx, AJCC 8th Edition - Clinical stage from 07/12/2024: Stage II (cT2, cN0, cM0, p16-) - Signed by [name], MD on 08/02/2024  Recurrent stage II (cT2, N0, M0) squamous cell carcinoma of the right tonsil, p16 negative; s/p concurrent chemoradiation completed on 09/15/2024  CHIEF COMPLAINT:  Here for follow-up and surveillance of *** cancer  Narrative:  The patient returns today for routine follow-up.  ***                    ALLERGIES:  has no known allergies.  Meds: Current Outpatient Medications  Medication Sig Dispense Refill   acetaminophen  (TYLENOL ) 500 MG tablet Take 1,000 mg by mouth every 6 (six) hours as needed for moderate pain.     aspirin  EC 81 MG tablet Take 81 mg by mouth daily.     atorvastatin  (LIPITOR) 10 MG tablet Take 10 mg by mouth daily.     cyanocobalamin  (VITAMIN B12) 1000 MCG/ML injection Inject 1,000 mcg into the muscle every 30 (thirty) days.     dexamethasone  (DECADRON ) 4 MG  tablet Take 2 tablets (8 mg) by mouth daily x 3 days starting the day after cisplatin  chemotherapy. Take with food. 30 tablet 1   fluconazole  (DIFLUCAN ) 100 MG tablet Take 2 tablets today, then 1 tablet daily x 20 more days. 22 tablet 0   ipratropium (ATROVENT) 0.06 % nasal spray Place 2 sprays into both nostrils daily as needed for rhinitis.     levothyroxine  (SYNTHROID ) 125 MCG tablet Take 125 mcg by mouth daily before breakfast.  3   lidocaine  (XYLOCAINE ) 2 % solution Patient: Mix 1part 2% viscous lidocaine , 1part H20. Swish & swallow 10mL of diluted mixture, 30min before meals and at bedtime, up to QID 200 mL 3   lidocaine -prilocaine  (EMLA ) cream Apply to affected area once 30 g 3   magic mouthwash (nystatin , lidocaine , diphenhydrAMINE , alum & mag hydroxide) suspension Swish and spit 5 mLs 4 (four) times daily as needed for mouth pain. Can swish and swallow if needed 360 mL 3   mirtazapine  (REMERON ) 7.5 MG tablet TAKE 1 TABLET BY MOUTH AT BEDTIME. 90 tablet 1   ondansetron  (ZOFRAN ) 8 MG tablet Take 1 tablet (8 mg total) by mouth every 8 (eight) hours as needed for nausea or vomiting. Start on the third day after cisplatin . 30 tablet 1   prochlorperazine  (COMPAZINE ) 10 MG tablet Take 1 tablet (10 mg total) by mouth every 6 (six) hours as needed (Nausea or vomiting). 30 tablet 1   No current facility-administered medications for this  visit.    Physical Findings: The patient is in no acute distress. Patient is alert and oriented. Wt Readings from Last 3 Encounters:  09/14/24 156 lb 6 oz (70.9 kg)  09/14/24 156 lb 6 oz (70.9 kg)  09/13/24 158 lb 4.8 oz (71.8 kg)    vitals were not taken for this visit. .  General: Alert and oriented, in no acute distress HEENT: Head is normocephalic. Extraocular movements are intact. Oropharynx is notable for *** Neck: Neck is notable for *** Skin: Skin in treatment fields shows satisfactory healing *** Heart: Regular in rate and rhythm with no murmurs,  rubs, or gallops. Chest: Clear to auscultation bilaterally, with no rhonchi, wheezes, or rales. Abdomen: Soft, nontender, nondistended, with no rigidity or guarding. Extremities: No cyanosis or edema. Lymphatics: see Neck Exam Psychiatric: Judgment and insight are intact. Affect is appropriate.   Lab Findings: Lab Results  Component Value Date   WBC 1.9 (L) 09/14/2024   HGB 11.9 (L) 09/14/2024   HCT 33.5 (L) 09/14/2024   MCV 91.8 09/14/2024   PLT 81 (L) 09/14/2024    Lab Results  Component Value Date   TSH 0.987 07/12/2024    Radiographic Findings: No results found.  Impression/Plan:  Recurrent stage II (cT2, N0, M0) squamous cell carcinoma of the right tonsil, p16 negative; s/p concurrent chemoradiation completed on 09/15/2024  1) Head and Neck Cancer Status: ***  2) Nutritional Status: *** PEG tube: ***  3) Risk Factors: The patient has been educated about risk factors including alcohol and tobacco abuse; they understand that avoidance of alcohol and tobacco is important to prevent recurrences as well as other cancers  4) Swallowing: ***  5) Dental: Encouraged to continue regular followup with dentistry, and dental hygiene including fluoride  rinses. ***  6) Thyroid  function:  Lab Results  Component Value Date   TSH 0.987 07/12/2024    7) Other: ***  8) Routine restaging PET in 2.5 months. Out of an abundance of caution, Dr. Izell also recommends MRI soft tissue of the neck with and without contrast. We will see the patient after imaging for comprehensive PE and imaging review.   The patient was encouraged to call with any issues or questions before then.  On date of service, in total, I spent *** minutes on this encounter. Patient was seen in person. _____________________________________    Leeroy Due, PA-C

## 2024-10-03 ENCOUNTER — Ambulatory Visit
Admission: RE | Admit: 2024-10-03 | Discharge: 2024-10-03 | Disposition: A | Source: Ambulatory Visit | Attending: Radiology | Admitting: Radiology

## 2024-10-03 ENCOUNTER — Other Ambulatory Visit (HOSPITAL_COMMUNITY): Payer: Self-pay

## 2024-10-03 ENCOUNTER — Encounter: Payer: Self-pay | Admitting: Radiology

## 2024-10-03 VITALS — BP 119/75 | HR 92 | Temp 97.5°F | Resp 18 | Ht 71.0 in | Wt 154.8 lb

## 2024-10-03 DIAGNOSIS — C099 Malignant neoplasm of tonsil, unspecified: Secondary | ICD-10-CM

## 2024-10-04 ENCOUNTER — Other Ambulatory Visit: Payer: Self-pay

## 2024-10-05 ENCOUNTER — Encounter: Payer: Self-pay | Admitting: Oncology

## 2024-10-05 ENCOUNTER — Inpatient Hospital Stay: Attending: Oncology

## 2024-10-05 ENCOUNTER — Inpatient Hospital Stay: Attending: Oncology | Admitting: Oncology

## 2024-10-05 VITALS — BP 105/64 | HR 84 | Temp 97.9°F | Resp 18 | Ht 71.0 in | Wt 155.9 lb

## 2024-10-05 DIAGNOSIS — C099 Malignant neoplasm of tonsil, unspecified: Secondary | ICD-10-CM | POA: Diagnosis not present

## 2024-10-05 DIAGNOSIS — Z79899 Other long term (current) drug therapy: Secondary | ICD-10-CM | POA: Insufficient documentation

## 2024-10-05 DIAGNOSIS — E538 Deficiency of other specified B group vitamins: Secondary | ICD-10-CM | POA: Insufficient documentation

## 2024-10-05 DIAGNOSIS — C090 Malignant neoplasm of tonsillar fossa: Secondary | ICD-10-CM | POA: Diagnosis not present

## 2024-10-05 LAB — CBC WITH DIFFERENTIAL (CANCER CENTER ONLY)
Abs Immature Granulocytes: 0.06 K/uL (ref 0.00–0.07)
Basophils Absolute: 0 K/uL (ref 0.0–0.1)
Basophils Relative: 1 %
Eosinophils Absolute: 0 K/uL (ref 0.0–0.5)
Eosinophils Relative: 1 %
HCT: 30.9 % — ABNORMAL LOW (ref 39.0–52.0)
Hemoglobin: 10.8 g/dL — ABNORMAL LOW (ref 13.0–17.0)
Immature Granulocytes: 2 %
Lymphocytes Relative: 16 %
Lymphs Abs: 0.5 K/uL — ABNORMAL LOW (ref 0.7–4.0)
MCH: 33.5 pg (ref 26.0–34.0)
MCHC: 35 g/dL (ref 30.0–36.0)
MCV: 96 fL (ref 80.0–100.0)
Monocytes Absolute: 0.6 K/uL (ref 0.1–1.0)
Monocytes Relative: 20 %
Neutro Abs: 1.7 K/uL (ref 1.7–7.7)
Neutrophils Relative %: 60 %
Platelet Count: 212 K/uL (ref 150–400)
RBC: 3.22 MIL/uL — ABNORMAL LOW (ref 4.22–5.81)
RDW: 17 % — ABNORMAL HIGH (ref 11.5–15.5)
WBC Count: 2.8 K/uL — ABNORMAL LOW (ref 4.0–10.5)
nRBC: 0 % (ref 0.0–0.2)

## 2024-10-05 LAB — CMP (CANCER CENTER ONLY)
ALT: 16 U/L (ref 0–44)
AST: 19 U/L (ref 15–41)
Albumin: 3.8 g/dL (ref 3.5–5.0)
Alkaline Phosphatase: 109 U/L (ref 38–126)
Anion gap: 10 (ref 5–15)
BUN: 24 mg/dL — ABNORMAL HIGH (ref 8–23)
CO2: 28 mmol/L (ref 22–32)
Calcium: 10.1 mg/dL (ref 8.9–10.3)
Chloride: 96 mmol/L — ABNORMAL LOW (ref 98–111)
Creatinine: 0.96 mg/dL (ref 0.61–1.24)
GFR, Estimated: >60 mL/min (ref >60)
Glucose, Bld: 108 mg/dL — ABNORMAL HIGH (ref 70–99)
Potassium: 4.6 mmol/L (ref 3.5–5.1)
Sodium: 134 mmol/L — ABNORMAL LOW (ref 135–145)
Total Bilirubin: 0.4 mg/dL (ref 0.0–1.2)
Total Protein: 6.9 g/dL (ref 6.5–8.1)

## 2024-10-05 LAB — MAGNESIUM: Magnesium: 2.2 mg/dL (ref 1.7–2.4)

## 2024-10-05 NOTE — Assessment & Plan Note (Signed)
 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  He continued to have follow-ups up until 2022 with no evidence of disease clinically and on imaging.  Previously treated by Dr. Lonn in our clinic.  Now with a recurrence on the right side, p16 negative.  No evidence of metastatic disease on PET scan.  Please review oncology history for additional details and timeline of events.  He was seen in consultation by Dr. Lauralee (surgical ENT) on 06/28/24 to discuss surgical treatment options. Although surgery would likely provide him with the best outcome, Dr. 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 In this setting, Dr. Lauralee would like him to be considered for re-irradiation.   His case was discussed in our ENT tumor conference on 07/12/2024.  Consensus opinion is to proceed with concurrent chemoradiation with cisplatin .  On 07/17/2024, MRI of the neck showed nodular thickening and enhancement of the right palatine tonsil extending to the soft palate, base of the uvula, and lateral/posterolateral oropharyngeal mucosa, compatible with oropharyngeal malignancy. 7 mm nodule in the superficial lobe of the right parotid corresponding to prior PET uptake, suspicious for primary parotid neoplasm or metastatic lymph node.  On 07/24/2024, core biopsy of the right parotid lesion showed bland oncocytic lesion, benign finding.  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   Plan made to proceed with concurrent chemoradiation using weekly cisplatin .  Started radiation treatments from 08/02/2024.  Received cycle 1 of cisplatin  on 08/04/2024.  He completed treatments on 09/15/2024.  Received 7 cycles of cisplatin .  He is recovering reasonably well from chemoradiation induced side effects.  Labs today revealed improved white count of 2800 with  normal differential.  Hemoglobin stable at 10.8, platelet count now normal at 212,000.  Creatinine 0.96, normal electrolytes.  Radiation oncology department requested PET scan and MRI of the neck to be done in late February or March 2026.  - Ensure hydration with 70-80 ounces of fluid daily  I will plan to see him in 3 months for follow-up with results of above imaging studies.

## 2024-10-05 NOTE — Progress Notes (Signed)
 [facility]  ONCOLOGY CLINIC PROGRESS NOTE   Patient Care Team: Marvene Prentice SAUNDERS, FNP as PCP - General (Family Medicine) Carlie Clark, MD as Consulting Physician (Otolaryngology) Izell Domino, MD as Attending Physician (Radiation Oncology) Daryle Heron CROME, RD as Dietitian (Nutrition) Malmfelt, Delon CROME, RN as Oncology Nurse Navigator [name], MD as Consulting Physician (Oncology) Lauralee Chew, MD as Referring Physician (Otolaryngology)  PATIENT NAME: Gregory Jenkins   MR#: [id number] DOB: 1951/04/14  Date of visit: 10/05/2024   ASSESSMENT & PLAN:   [name] is a 73 y.o. gentleman with a past medical history of left tonsillar squamous cell carcinoma, managed with concurrent chemoradiation using cetuximab  in 2017, went into remission, was referred to our clinic for recently diagnosed right tonsillar cancer. cT2,cN0,cM0,p16- , Stage II.   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  He continued to have follow-ups up until 2022 with no evidence of disease clinically and on imaging.  Previously treated by Dr. Lonn in our clinic.  Now with a recurrence on the right side, p16 negative.  No evidence of metastatic disease on PET scan.  Please review oncology history for additional details and timeline of events.  He was seen in consultation by Dr. Lauralee (surgical ENT) on 06/28/24 to discuss surgical treatment options. Although surgery would likely provide him with the best outcome, Dr. 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 In this setting, Dr. Lauralee would like him to be considered for re-irradiation.   His case was discussed in our ENT tumor conference on 07/12/2024.  Consensus opinion is to proceed with concurrent chemoradiation with  cisplatin .  On 07/17/2024, MRI of the neck showed nodular thickening and enhancement of the right palatine tonsil extending to the soft palate, base of the uvula, and lateral/posterolateral oropharyngeal mucosa, compatible with oropharyngeal malignancy. 7 mm nodule in the superficial lobe of the right parotid corresponding to prior PET uptake, suspicious for primary parotid neoplasm or metastatic lymph node.  On 07/24/2024, core biopsy of the right parotid lesion showed bland oncocytic lesion, benign finding.  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   Plan made to proceed with concurrent chemoradiation using weekly cisplatin .  Started radiation treatments from 08/02/2024.  Received cycle 1 of cisplatin  on 08/04/2024.  He completed treatments on 09/15/2024.  Received 7 cycles of cisplatin .  He is recovering reasonably well from chemoradiation induced side effects.  Labs today revealed improved white count of 2800 with normal differential.  Hemoglobin stable at 10.8, platelet count now normal at 212,000.  Creatinine 0.96, normal electrolytes.  Radiation oncology department requested PET scan and MRI of the neck to be done in late February or March 2026.  - Ensure hydration with 70-80 ounces of fluid daily  I will plan to see him in 3 months for follow-up with results of above imaging studies.  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 Oral intake remains limited to nutritional shakes and water, with plans to introduce broths and egg drop soup for additional protein. Weight is maintained. Blood counts are improving with normalization of leukocytes and platelets; hemoglobin is mildly decreased but stable. Oral mucosa is healing without evidence of infection or thrush. Hydration is adequate, and renal and hepatic function are normal. Preserving oral intake is critical  for maintaining swallowing  function and preventing aspiration. No new or concerning symptoms. - Continue nutritional shakes and water as tolerated. - Introduce broths and egg drop soup for additional protein as tolerated. - Continue alternating Magic mouthwash and lidocaine  for oral comfort. - Continue Miralax for bowel regimen. - Maintain hydration at home. - Monitor weight and oral intake. - Continue nutritionist follow-up as needed.  Vitamin B12 deficiency Vitamin B12 deficiency managed with intramuscular injections in the oncology clinic. Last injection administered November 24; next dose due in approximately two weeks. No laboratory monitoring required at time of injection. Annual physical with primary care deferred until post-imaging. - Arrange vitamin B12 injection in clinic around December 22. - Schedule monthly B12 injections in clinic for December, January, and February. - No laboratory tests required at time of injection. -Patient is planning to defer annual physical with primary care until after PET scan and MRI results.  Depression Experiencing depressed mood and insomnia, likely secondary to prolonged recovery and limited activity. Mirtazapine  previously prescribed for mood and sleep support but recently missed; plan to resume nightly dosing. Caregiver involved to assist with medication adherence. Emphasized importance of routine and monitoring for mood changes. - Resume nightly mirtazapine  for mood and sleep support. - Monitor mood and sleep quality. - Caregiver to assist with medication administration to ensure adherence.  I reviewed lab results and outside records for this visit and discussed relevant results with the patient. Diagnosis, plan of care and treatment options were also discussed in detail with the patient. Opportunity provided to ask questions and answers provided to his apparent satisfaction. Provided instructions to call our clinic with any problems, questions or concerns prior to return  visit. I recommended to continue follow-up with PCP and sub-specialists. He verbalized understanding and agreed with the plan.   NCCN guidelines have been consulted in the planning of this patients care.  I spent a total of 32 minutes during this encounter with the patient including review of chart and various tests results, discussions about plan of care and coordination of care plan.   [name], MD  10/05/2024 10:36 AM  [facility] - A DEPT [facility] [address] [place] [address] Dept: [phone] Dept Fax: [phone]    CHIEF COMPLAINT/ REASON FOR VISIT:   Recently diagnosed right tonsillar cancer. cT2,cN0,cM0,p16- , Stage II. Has history of left-sided tonsillar cancer treated in 2017 with chemoradiation using cetuximab .   Current Treatment: Started concurrent chemoradiation with weekly cisplatin  from 08/02/2024.  Completed treatments on 09/15/2024.  INTERVAL HISTORY:    Discussed the use of AI scribe software for clinical note transcription with the patient, who gave verbal consent to proceed.  History of Present Illness   Kyrel Leighton is a 73 year old male with head and neck cancer, status post chemoradiation therapy, who presents for oncology follow-up focused on recovery from oral mucositis and malnutrition.  He is recovering from oral mucositis and malnutrition following recent cancer therapy. Oral intake is limited to nutritional shakes and water, with weight maintained. He is considering introducing broths and egg drop soup to increase protein intake. He is able to take fluids by mouth, which is encouraged to preserve swallowing function. Oral comfort is managed with alternating Magic mouthwash and lidocaine  rinses. He has discontinued antiemetics and continues Miralax for bowel management. Nocturia is present, and there is no dysuria or urinary discomfort reported.  Mood and  sleep are affected  by the slow pace of recovery and limited outdoor activity due to cold weather. He has recently missed doses of mirtazapine  at night but plans to resume to support sleep and mood. He expresses frustration with the recovery process and notes difficulty tolerating cold due to decreased body fat.  He received his last vitamin B12 injection on September 18, 2024, and is due for the next dose in approximately two weeks. He prefers to continue B12 injections at the oncology clinic and plans to defer his annual physical with primary care until after upcoming PET scan and MRI of the head and neck are completed. Family members remain involved in his care and assist with transportation as needed.   I have reviewed the past medical history, past surgical history, social history and family history with the patient and they are unchanged from previous note.  HISTORY OF PRESENT ILLNESS:   ONCOLOGY HISTORY:   Initial diagnosis of left tonsillar cancer in 2017 for which he received radiation therapy to the left tonsil and involved bilateral cervical lymph nodes. This was concurrent with systemic therapy consisting of cetuximab . He was last seen by Rad Onc for a follow-up visit on 10/02/2016 and was NED on examination and imaging at that time.    He continued to follow with Dr. Carlie (ENT) and with Dr. Lonn under surveillance until 2022. He did have follow-up imaging performed during that interval of time which consisted of a follow-up neck CT on 02/13/2019 that showed NED and stable post treatment changes in the neck.    He did however return to Dr. Ilona office on 02/24/22 for evaluation of a spot on the back of his tongue. However, oral exam performed at that time did not show any abnormal findings to correlate with the reported concern.    He presented to Dr. Ilona office on 06/07/24 with a 3 month history of a sore throat which he characterized as feeling raw on the right side of his  throat (with occasional extension to the roof of his mouth), as well as ear pressure. Oral exam performed at that time noted an exudative mass of the right tonsillar fossa. A biopsy of the right tonsillar mass was accordingly obtained at that time that showed findings consistent with invasive squamous cell carcinoma (moderately differentiated); p16 negative.    Soft tissue neck CT with contrast on 06/19/24 demonstrated: the right palatine tonsillar mass extending onto the soft palate and into the right glossotonsillar sulcus, consistent with SCC, measuring at least 26 mm. No abnormal lymph nodes were demonstrated in the neck. A chest CT was also performed that same day which showed several indeterminate punctate pulmonary nodules, but no definite evidence of pulmonary metastatic disease.    He was seen in consultation by Dr. Lauralee (surgical ENT) on 06/28/24 to discuss surgical treatment options. Although surgery would likely provide him with the best outcome, Dr. 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 In this setting, Dr. Lauralee would like him to be considered for re-irradiation. He has also been referred back to medical oncology.   PET scan performed on 07/04/24 demonstrated: the hypermetabolic lesion in the vicinity of the right palatine tonsil consistent with malignancy, and with potential extension to the uvula, and a hypermetabolic nodule in the upper right parotid gland with potential differential considerations including primary parotid neoplasm vs a metastatic lymph node noted. Imaging otherwise showed no evidence of metastatic disease to the chest,  abdomen, or pelvis. Other findings of potential clinical significance included a bladder calculus and prostatomegaly with a prominent median lobe indenting the bladder base.    His case was discussed in tumor conference on 07/12/2024.  Plan made to proceed with MRI of  the neck for further evaluation and delineation of the tonsil mass.  Also plan for biopsy of the parotid lesion.  On 07/17/2024, MRI of the neck showed nodular thickening and enhancement of the right palatine tonsil extending to the soft palate, base of the uvula, and lateral/posterolateral oropharyngeal mucosa, compatible with oropharyngeal malignancy. 7 mm nodule in the superficial lobe of the right parotid corresponding to prior PET uptake, suspicious for primary parotid neoplasm or metastatic lymph node.  On 07/24/2024, core biopsy of the right parotid lesion showed bland oncocytic lesion, benign finding.  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   Plan made to proceed with concurrent chemoradiation using weekly cisplatin .  Started this from 08/02/2024.  Completed chemoradiation treatments with cycle 7 of cisplatin  on 09/15/2024.   Oncology History  Malignant neoplasm of tonsillar fossa (HCC)  11/12/2015 Procedure   Accession: [id number] FNA of left neck LN positive for squamous cell cancer   11/18/2015 Imaging   Left greater than right cervical lymphadenopathy suspicious for nodal metastatic disease.2. Mild asymmetry of the left tonsil -- correlate with direct visualization to assess for primary neoplasm.   11/20/2015 Procedure   He has left tonsil biopsy   11/20/2015 Pathology Results   Accession: [id number] Left tonsil biopsy showed squamous cell cancer, p16 positive.   11/28/2015 PET scan   1. Left palatine tonsil primary with left worse than right cervical nodal metastasis. 2. No extracervical hypermetabolic metastasis.    12/12/2015 Procedure   Peg and port-a-cath placement.   12/18/2015 - 01/29/2016 Chemotherapy   He received weekly cetuximab    12/18/2015 - 02/03/2016 Radiation Therapy   Received Helicle IMRT Tomotherapy:  Left tonsil and bilateral neck / 70 Gy in 35 fractions to gross disease, 63 Gy in 35 fractions to high risk nodal echelons, and 56 Gy in  35 fractions to intermediate risk nodal echelons.   12/31/2015 Adverse Reaction   Treatment #3 is placed on hold due to worsening mucositis   01/08/2016 Miscellaneous   Cetuximab  #3 resumed with 50% dose adjustment   01/14/2016 Adverse Reaction   Treatment #4 is placed on hold due to worsening mucositis and failure to thrive   03/20/2016 Imaging   CT abdomen showed mild gallbladder wall thickening with pericholecystic fluid and several gallstones. The gallbladder is nondistended. Recommend clinical correlation for acute or chronic cholecystitis.   04/15/2016 Procedure   PEG removed.   05/26/2016 Imaging   Restaging PET:  Marked interval improvement with resolution of left tonsillar hypermetabolism and right cervical lymph node hypermetabolism. Left cervical lymph nodes have decreased substantially in size and there is a subtle degree of FDG hypermetabolism still associated with these left-sided cervical nodes.   10/12/2016 Procedure   Port-a-cath removed.   02/14/2019 Imaging   CT neck 1. Satisfactory post treatment appearance of the neck. NI-Rads category 1. 2. No mass or abnormality to correspond to the marked area of concern at the level of the right submandibular gland.     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  ALLERGIES: He has no known allergies.  MEDICATIONS:  Current Outpatient Medications  Medication Sig Dispense Refill   acetaminophen  (TYLENOL ) 500 MG  tablet Take 1,000 mg by mouth every 6 (six) hours as needed for moderate pain.     aspirin  EC 81 MG tablet Take 81 mg by mouth daily.     atorvastatin  (LIPITOR) 10 MG tablet Take 10 mg by mouth daily.     cyanocobalamin  (VITAMIN B12) 1000 MCG/ML injection Inject 1,000 mcg into the muscle every 30 (thirty) days.     ipratropium (ATROVENT) 0.06 % nasal spray Place 2 sprays into both nostrils daily as needed for rhinitis.     levothyroxine  (SYNTHROID ) 125 MCG tablet Take 125 mcg by mouth daily before breakfast.  3   lidocaine  (XYLOCAINE ) 2 % solution Patient: Mix 1part 2% viscous lidocaine , 1part H20. Swish & swallow 10mL of diluted mixture, 30min before meals and at bedtime, up to QID 200 mL 3   lidocaine -prilocaine  (EMLA ) cream Apply to affected area once 30 g 3   magic mouthwash (nystatin , lidocaine , diphenhydrAMINE , alum & mag hydroxide) suspension Swish and spit 5 mLs 4 (four) times daily as needed for mouth pain. Can swish and swallow if needed 360 mL 3   mirtazapine  (REMERON ) 7.5 MG tablet TAKE 1 TABLET BY MOUTH AT BEDTIME. 90 tablet 1   dexamethasone  (DECADRON ) 4 MG tablet Take 2 tablets (8 mg) by mouth daily x 3 days starting the day after cisplatin  chemotherapy. Take with food. (Patient not taking: Reported on 10/05/2024) 30 tablet 1   ondansetron  (ZOFRAN ) 8 MG tablet Take 1 tablet (8 mg total) by mouth every 8 (eight) hours as needed for nausea or vomiting. Start on the third day after cisplatin . (Patient not taking: Reported on 10/05/2024) 30 tablet 1   prochlorperazine  (COMPAZINE ) 10 MG tablet Take 1 tablet (10 mg total) by mouth every 6 (six) hours as needed (Nausea or vomiting). (Patient not taking: Reported on 10/05/2024) 30 tablet 1   No current facility-administered medications for this visit.     VITALS:   Blood pressure 105/64, pulse 84, temperature 97.9 F (36.6 C), temperature source Oral, resp. rate 18, height 5' 11 (1.803 m), weight 155 lb 14.4 oz (70.7 kg), SpO2 100%.  Wt  Readings from Last 3 Encounters:  10/05/24 155 lb 14.4 oz (70.7 kg)  10/03/24 154 lb 12.8 oz (70.2 kg)  09/14/24 156 lb 6 oz (70.9 kg)    Body mass index is 21.74 kg/m.    Onc Performance Status - 10/05/24 0943       ECOG Perf Status   ECOG Perf Status Ambulatory and capable of all selfcare but unable to carry out any work activities.  Up and about more than 50% of waking hours      KPS SCALE   KPS % SCORE Cares for self, unable to carry on normal activity or to do active work            PHYSICAL EXAM:   Physical Exam Constitutional:      General: He is not in acute distress.    Appearance: Normal appearance.  HENT:  Head: Normocephalic and atraumatic.     Mouth/Throat:     Comments: No abnormalities noted in the visualized portion of oral cavity. Eyes:     Conjunctiva/sclera: Conjunctivae normal.  Neck:     Comments: Post radiation therapy fibrosis noted in the neck area, well-healed.   No definite palpable cervical lymphadenopathy Cardiovascular:     Rate and Rhythm: Normal rate and regular rhythm.  Pulmonary:     Effort: Pulmonary effort is normal. No respiratory distress.  Abdominal:     General: There is no distension.  Neurological:     General: No focal deficit present.     Mental Status: He is alert and oriented to person, place, and time.  Psychiatric:        Mood and Affect: Mood normal.        Behavior: Behavior normal.      LABORATORY DATA:   I have reviewed the data as listed.  Results for orders placed or performed in visit on 10/05/24  Magnesium   Result Value Ref Range   Magnesium  2.2 1.7 - 2.4 mg/dL  CMP (Cancer Center only)  Result Value Ref Range   Sodium 134 (L) 135 - 145 mmol/L   Potassium 4.6 3.5 - 5.1 mmol/L   Chloride 96 (L) 98 - 111 mmol/L   CO2 28 22 - 32 mmol/L   Glucose, Bld 108 (H) 70 - 99 mg/dL   BUN 24 (H) 8 - 23 mg/dL   Creatinine 9.03 9.38 - 1.24 mg/dL   Calcium  10.1 8.9 - 10.3 mg/dL   Total Protein 6.9 6.5 -  8.1 g/dL   Albumin 3.8 3.5 - 5.0 g/dL   AST 19 15 - 41 U/L   ALT 16 0 - 44 U/L   Alkaline Phosphatase 109 38 - 126 U/L   Total Bilirubin 0.4 0.0 - 1.2 mg/dL   GFR, Estimated >39 >39 mL/min   Anion gap 10 5 - 15  CBC with Differential (Cancer Center Only)  Result Value Ref Range   WBC Count 2.8 (L) 4.0 - 10.5 K/uL   RBC 3.22 (L) 4.22 - 5.81 MIL/uL   Hemoglobin 10.8 (L) 13.0 - 17.0 g/dL   HCT 69.0 (L) 60.9 - 47.9 %   MCV 96.0 80.0 - 100.0 fL   MCH 33.5 26.0 - 34.0 pg   MCHC 35.0 30.0 - 36.0 g/dL   RDW 82.9 (H) 88.4 - 84.4 %   Platelet Count 212 150 - 400 K/uL   nRBC 0.0 0.0 - 0.2 %   Neutrophils Relative % 60 %   Neutro Abs 1.7 1.7 - 7.7 K/uL   Lymphocytes Relative 16 %   Lymphs Abs 0.5 (L) 0.7 - 4.0 K/uL   Monocytes Relative 20 %   Monocytes Absolute 0.6 0.1 - 1.0 K/uL   Eosinophils Relative 1 %   Eosinophils Absolute 0.0 0.0 - 0.5 K/uL   Basophils Relative 1 %   Basophils Absolute 0.0 0.0 - 0.1 K/uL   Immature Granulocytes 2 %   Abs Immature Granulocytes 0.06 0.00 - 0.07 K/uL      RADIOGRAPHIC STUDIES:  No recent pertinent imaging available to review.   CODE STATUS:  Code Status History     Date Active Date Inactive Code Status Order ID Comments User Context   07/19/2024 1643 07/20/2024 0515 Full Code [id number]  Hughes Simmonds, MD [facility]   06/20/2023 1423 06/23/2023 1934 Full Code [id number]  Waddell Rake, MD ED   05/31/2023 1005 06/02/2023 0101 Full Code [id number]  Magda Debby SAILOR, MD Inpatient    Questions for Most Recent Historical Code Status (Order [id number])     Question Answer   By: Consent: discussion documented in EHR            Orders Placed This Encounter  Procedures   CBC with Differential (Cancer Center Only)    Standing Status:   Future    Expected Date:   01/03/2025    Expiration Date:   04/03/2025   CMP (Cancer Center only)    Standing Status:   Future    Expected Date:   01/03/2025    Expiration Date:   04/03/2025   Magnesium     Standing Status:    Future    Expected Date:   01/03/2025    Expiration Date:   04/03/2025     Future Appointments  Date Time Provider Department Center  10/16/2024 11:00 AM [facility] None  11/13/2024 11:15 AM [facility] None  12/26/2024  9:00 AM Wyatt Leeroy HERO, PA-C [facility] None     This document was completed utilizing speech recognition software. Grammatical errors, random word insertions, pronoun errors, and incomplete sentences are an occasional consequence of this system due to software limitations, ambient noise, and hardware issues. Any formal questions or concerns about the content, text or information contained within the body of this dictation should be directly addressed to the provider for clarification.

## 2024-10-08 ENCOUNTER — Other Ambulatory Visit: Payer: Self-pay

## 2024-10-10 ENCOUNTER — Ambulatory Visit

## 2024-10-10 ENCOUNTER — Ambulatory Visit: Payer: Self-pay | Admitting: Physical Therapy

## 2024-10-16 ENCOUNTER — Inpatient Hospital Stay

## 2024-10-16 DIAGNOSIS — E538 Deficiency of other specified B group vitamins: Secondary | ICD-10-CM

## 2024-10-16 DIAGNOSIS — D61818 Other pancytopenia: Secondary | ICD-10-CM

## 2024-10-16 MED ORDER — CYANOCOBALAMIN 1000 MCG/ML IJ SOLN
1000.0000 ug | Freq: Once | INTRAMUSCULAR | Status: AC
  Administered 2024-10-16: 1000 ug via INTRAMUSCULAR
  Filled 2024-10-16: qty 1

## 2024-10-24 ENCOUNTER — Other Ambulatory Visit (HOSPITAL_COMMUNITY): Payer: Self-pay

## 2024-10-31 ENCOUNTER — Inpatient Hospital Stay: Admitting: Nutrition

## 2024-10-31 NOTE — Progress Notes (Signed)
 Brief telephone follow up with patient's wife, Landry.  Nutrition follow-up completed with patient with stage II SCC of the right tonsil.  Patient finished concurrent chemoradiation therapy in November.  No feeding tube.  Labs and medications reviewed.   Weight stable and documented as 155.9 pounds December 11. Beth reports a 5 pound weight gain on home scale. He has started to eat solid food. Tolerated biscuits with gravy, eggs and an egg sandwich recently. Still drinking Boost VHC and strives for 4 cartons daily. Is drinking some tea to add hydration. Still drinks a lot of water. He had a recent recurrence of mouth sores which improved after using MMW again. Currently, no concerns. Hopeful that the next scan will look positive.  Support and encouragement provided. I am available as needed for nutrition follow up.

## 2024-11-13 ENCOUNTER — Inpatient Hospital Stay: Attending: Oncology

## 2024-11-13 DIAGNOSIS — E538 Deficiency of other specified B group vitamins: Secondary | ICD-10-CM | POA: Insufficient documentation

## 2024-11-13 DIAGNOSIS — D61818 Other pancytopenia: Secondary | ICD-10-CM

## 2024-11-13 MED ORDER — CYANOCOBALAMIN 1000 MCG/ML IJ SOLN
1000.0000 ug | Freq: Once | INTRAMUSCULAR | Status: AC
  Administered 2024-11-13: 1000 ug via INTRAMUSCULAR
  Filled 2024-11-13: qty 1

## 2024-11-27 ENCOUNTER — Other Ambulatory Visit (HOSPITAL_COMMUNITY): Payer: Self-pay

## 2024-11-27 ENCOUNTER — Encounter: Payer: Self-pay | Admitting: Oncology

## 2024-12-11 ENCOUNTER — Inpatient Hospital Stay: Attending: Oncology

## 2024-12-18 ENCOUNTER — Other Ambulatory Visit (HOSPITAL_COMMUNITY)

## 2024-12-18 ENCOUNTER — Ambulatory Visit (HOSPITAL_COMMUNITY)

## 2024-12-26 ENCOUNTER — Ambulatory Visit: Admitting: Radiation Oncology

## 2024-12-26 ENCOUNTER — Ambulatory Visit: Admitting: Radiology

## 2025-01-03 ENCOUNTER — Inpatient Hospital Stay: Attending: Oncology

## 2025-01-03 ENCOUNTER — Inpatient Hospital Stay: Admitting: Oncology
